# Patient Record
Sex: Female | Born: 1940 | Hispanic: Refuse to answer | Smoking: Former smoker
Health system: Southern US, Community
[De-identification: ages and names within clinical notes are randomized; demographics above are authoritative.]

## PROBLEM LIST (undated history)

## (undated) DIAGNOSIS — F419 Anxiety disorder, unspecified: Secondary | ICD-10-CM

## (undated) DIAGNOSIS — G4730 Sleep apnea, unspecified: Secondary | ICD-10-CM

## (undated) DIAGNOSIS — E139 Other specified diabetes mellitus without complications: Secondary | ICD-10-CM

## (undated) DIAGNOSIS — C801 Malignant (primary) neoplasm, unspecified: Secondary | ICD-10-CM

## (undated) DIAGNOSIS — M810 Age-related osteoporosis without current pathological fracture: Secondary | ICD-10-CM

## (undated) DIAGNOSIS — T7840XA Allergy, unspecified, initial encounter: Secondary | ICD-10-CM

## (undated) DIAGNOSIS — I10 Essential (primary) hypertension: Secondary | ICD-10-CM

## (undated) DIAGNOSIS — H269 Unspecified cataract: Secondary | ICD-10-CM

## (undated) DIAGNOSIS — Z5189 Encounter for other specified aftercare: Secondary | ICD-10-CM

## (undated) DIAGNOSIS — M1990 Unspecified osteoarthritis, unspecified site: Secondary | ICD-10-CM

## (undated) DIAGNOSIS — J449 Chronic obstructive pulmonary disease, unspecified: Secondary | ICD-10-CM

## (undated) DIAGNOSIS — K219 Gastro-esophageal reflux disease without esophagitis: Secondary | ICD-10-CM

## (undated) DIAGNOSIS — F32A Depression, unspecified: Secondary | ICD-10-CM

## (undated) DIAGNOSIS — E785 Hyperlipidemia, unspecified: Secondary | ICD-10-CM

## (undated) HISTORY — DX: Other specified diabetes mellitus without complications: E13.9

## (undated) HISTORY — DX: Unspecified cataract: H26.9

## (undated) HISTORY — DX: Age-related osteoporosis without current pathological fracture: M81.0

## (undated) HISTORY — DX: Unspecified osteoarthritis, unspecified site: M19.90

## (undated) HISTORY — DX: Chronic obstructive pulmonary disease, unspecified: J44.9

## (undated) HISTORY — DX: Depression, unspecified: F32.A

## (undated) HISTORY — DX: Essential (primary) hypertension: I10

## (undated) HISTORY — PX: BREAST EXCISIONAL BIOPSY: SUR124

## (undated) HISTORY — PX: OTHER SURGICAL HISTORY: SHX169

## (undated) HISTORY — DX: Encounter for other specified aftercare: Z51.89

## (undated) HISTORY — DX: Anxiety disorder, unspecified: F41.9

## (undated) HISTORY — DX: Allergy, unspecified, initial encounter: T78.40XA

## (undated) HISTORY — DX: Malignant (primary) neoplasm, unspecified: C80.1

## (undated) HISTORY — DX: Sleep apnea, unspecified: G47.30

## (undated) HISTORY — PX: MELANOMA EXCISION: SHX5266

## (undated) HISTORY — DX: Gastro-esophageal reflux disease without esophagitis: K21.9

## (undated) HISTORY — DX: Hyperlipidemia, unspecified: E78.5

---

## 1978-02-06 HISTORY — PX: CHOLECYSTECTOMY: SHX55

## 1978-02-06 HISTORY — PX: ABDOMINAL HYSTERECTOMY: SHX81

## 1998-02-06 HISTORY — PX: REPLACEMENT TOTAL KNEE: SUR1224

## 2017-11-03 DIAGNOSIS — S42209A Unspecified fracture of upper end of unspecified humerus, initial encounter for closed fracture: Secondary | ICD-10-CM

## 2017-11-03 HISTORY — DX: Unspecified fracture of upper end of unspecified humerus, initial encounter for closed fracture: S42.209A

## 2018-02-11 DIAGNOSIS — F1111 Opioid abuse, in remission: Secondary | ICD-10-CM | POA: Insufficient documentation

## 2018-02-11 DIAGNOSIS — Z780 Asymptomatic menopausal state: Secondary | ICD-10-CM | POA: Insufficient documentation

## 2018-02-11 DIAGNOSIS — D0371 Melanoma in situ of right lower limb, including hip: Secondary | ICD-10-CM | POA: Insufficient documentation

## 2018-02-11 DIAGNOSIS — G5603 Carpal tunnel syndrome, bilateral upper limbs: Secondary | ICD-10-CM | POA: Insufficient documentation

## 2018-02-11 DIAGNOSIS — M069 Rheumatoid arthritis, unspecified: Secondary | ICD-10-CM | POA: Insufficient documentation

## 2018-02-11 HISTORY — DX: Asymptomatic menopausal state: Z78.0

## 2018-02-14 DIAGNOSIS — I340 Nonrheumatic mitral (valve) insufficiency: Secondary | ICD-10-CM | POA: Insufficient documentation

## 2018-02-14 DIAGNOSIS — N393 Stress incontinence (female) (male): Secondary | ICD-10-CM | POA: Insufficient documentation

## 2018-02-14 DIAGNOSIS — K640 First degree hemorrhoids: Secondary | ICD-10-CM | POA: Insufficient documentation

## 2018-02-14 DIAGNOSIS — M19019 Primary osteoarthritis, unspecified shoulder: Secondary | ICD-10-CM | POA: Insufficient documentation

## 2018-02-14 DIAGNOSIS — K5730 Diverticulosis of large intestine without perforation or abscess without bleeding: Secondary | ICD-10-CM | POA: Insufficient documentation

## 2018-02-14 HISTORY — DX: Stress incontinence (female) (male): N39.3

## 2018-02-22 DIAGNOSIS — F4001 Agoraphobia with panic disorder: Secondary | ICD-10-CM | POA: Insufficient documentation

## 2018-02-27 DIAGNOSIS — I6523 Occlusion and stenosis of bilateral carotid arteries: Secondary | ICD-10-CM | POA: Insufficient documentation

## 2018-02-27 HISTORY — DX: Occlusion and stenosis of bilateral carotid arteries: I65.23

## 2019-05-19 DIAGNOSIS — G459 Transient cerebral ischemic attack, unspecified: Secondary | ICD-10-CM | POA: Insufficient documentation

## 2019-05-19 DIAGNOSIS — K635 Polyp of colon: Secondary | ICD-10-CM | POA: Insufficient documentation

## 2019-05-19 DIAGNOSIS — N3946 Mixed incontinence: Secondary | ICD-10-CM | POA: Insufficient documentation

## 2019-05-19 DIAGNOSIS — F17201 Nicotine dependence, unspecified, in remission: Secondary | ICD-10-CM | POA: Insufficient documentation

## 2019-05-19 DIAGNOSIS — K219 Gastro-esophageal reflux disease without esophagitis: Secondary | ICD-10-CM | POA: Insufficient documentation

## 2019-05-19 DIAGNOSIS — R739 Hyperglycemia, unspecified: Secondary | ICD-10-CM

## 2019-05-19 DIAGNOSIS — F1111 Opioid abuse, in remission: Secondary | ICD-10-CM

## 2019-05-19 DIAGNOSIS — J449 Chronic obstructive pulmonary disease, unspecified: Secondary | ICD-10-CM | POA: Diagnosis present

## 2019-05-19 DIAGNOSIS — M4802 Spinal stenosis, cervical region: Secondary | ICD-10-CM | POA: Insufficient documentation

## 2019-05-19 DIAGNOSIS — I10 Essential (primary) hypertension: Secondary | ICD-10-CM

## 2019-05-19 DIAGNOSIS — F411 Generalized anxiety disorder: Secondary | ICD-10-CM | POA: Insufficient documentation

## 2019-05-19 DIAGNOSIS — G894 Chronic pain syndrome: Secondary | ICD-10-CM | POA: Insufficient documentation

## 2019-05-19 DIAGNOSIS — N819 Female genital prolapse, unspecified: Secondary | ICD-10-CM | POA: Insufficient documentation

## 2019-05-19 DIAGNOSIS — D649 Anemia, unspecified: Secondary | ICD-10-CM | POA: Insufficient documentation

## 2019-05-19 DIAGNOSIS — F319 Bipolar disorder, unspecified: Secondary | ICD-10-CM | POA: Insufficient documentation

## 2019-05-19 DIAGNOSIS — I6523 Occlusion and stenosis of bilateral carotid arteries: Secondary | ICD-10-CM | POA: Insufficient documentation

## 2019-05-19 DIAGNOSIS — F4323 Adjustment disorder with mixed anxiety and depressed mood: Secondary | ICD-10-CM | POA: Insufficient documentation

## 2019-05-19 DIAGNOSIS — Z8673 Personal history of transient ischemic attack (TIA), and cerebral infarction without residual deficits: Secondary | ICD-10-CM | POA: Insufficient documentation

## 2019-05-19 DIAGNOSIS — F330 Major depressive disorder, recurrent, mild: Secondary | ICD-10-CM | POA: Insufficient documentation

## 2019-05-19 DIAGNOSIS — K5750 Diverticulosis of both small and large intestine without perforation or abscess without bleeding: Secondary | ICD-10-CM | POA: Insufficient documentation

## 2019-05-19 DIAGNOSIS — G4733 Obstructive sleep apnea (adult) (pediatric): Secondary | ICD-10-CM | POA: Insufficient documentation

## 2019-05-19 DIAGNOSIS — I119 Hypertensive heart disease without heart failure: Secondary | ICD-10-CM | POA: Insufficient documentation

## 2019-05-19 DIAGNOSIS — F321 Major depressive disorder, single episode, moderate: Secondary | ICD-10-CM | POA: Insufficient documentation

## 2019-05-19 DIAGNOSIS — I369 Nonrheumatic tricuspid valve disorder, unspecified: Secondary | ICD-10-CM | POA: Insufficient documentation

## 2019-05-19 HISTORY — DX: Opioid abuse, in remission: F11.11

## 2019-05-19 HISTORY — DX: Essential (primary) hypertension: I10

## 2019-05-19 HISTORY — DX: Nicotine dependence, unspecified, in remission: F17.201

## 2019-05-19 HISTORY — DX: Hyperglycemia, unspecified: R73.9

## 2019-05-22 DIAGNOSIS — R7303 Prediabetes: Secondary | ICD-10-CM | POA: Insufficient documentation

## 2019-05-22 DIAGNOSIS — E1169 Type 2 diabetes mellitus with other specified complication: Secondary | ICD-10-CM | POA: Insufficient documentation

## 2019-05-22 DIAGNOSIS — E782 Mixed hyperlipidemia: Secondary | ICD-10-CM | POA: Insufficient documentation

## 2019-05-22 HISTORY — DX: Prediabetes: R73.03

## 2019-12-17 ENCOUNTER — Other Ambulatory Visit: Payer: Self-pay

## 2019-12-17 ENCOUNTER — Encounter: Payer: Self-pay | Admitting: Physician Assistant

## 2019-12-17 ENCOUNTER — Telehealth: Payer: Self-pay | Admitting: Physician Assistant

## 2019-12-17 ENCOUNTER — Ambulatory Visit (INDEPENDENT_AMBULATORY_CARE_PROVIDER_SITE_OTHER): Payer: Medicare HMO | Admitting: Physician Assistant

## 2019-12-17 VITALS — BP 110/70 | HR 90 | Temp 98.3°F | Resp 16 | Ht 64.0 in | Wt 252.0 lb

## 2019-12-17 DIAGNOSIS — I2510 Atherosclerotic heart disease of native coronary artery without angina pectoris: Secondary | ICD-10-CM

## 2019-12-17 DIAGNOSIS — Z85820 Personal history of malignant melanoma of skin: Secondary | ICD-10-CM

## 2019-12-17 DIAGNOSIS — F411 Generalized anxiety disorder: Secondary | ICD-10-CM

## 2019-12-17 DIAGNOSIS — I10 Essential (primary) hypertension: Secondary | ICD-10-CM

## 2019-12-17 NOTE — Telephone Encounter (Signed)
Needs clarification on a medication that we sent in

## 2019-12-17 NOTE — Patient Instructions (Signed)
You will be contacted by Oncology and Cardiology.  I will make sure you have medication refills at your pharmacy.  I will call you once I get records and have reviewed them.  Schedule your Physical at your earliest convenience.   It was very nice meeting you today. Welcome to [facility]!

## 2019-12-17 NOTE — Progress Notes (Signed)
Patient presents to clinic today to establish care.  Patient recently moved here from [place] in September to be closer to her sons and grandchildren.  She currently resides in [facility].  Notes enjoying her neighbors and has made many new friends there.  Acute Concerns: Denies acute concerns at today's visit.   Chronic Issues: Hypertension/Hyperlipidemia --patient currently on a regimen of amlodipine 5 mg daily, hydrochlorothiazide 25 mg daily, losartan 100 mg daily and hydralazine 25 mg twice daily.  Also takes atorvastatin 10 mg once daily along with a baby aspirin.  Denies personal history of stroke or heart attack.  Notes prior stress testing and being told this was normal.  Has history of sleep apnea with nighttime CPAP use.  Patient was followed by cardiology in [place] and would like a specialist here in the area.  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  Has been followed by both dermatology and oncology.  Has dermatologist in the area but has not been able to establish with a new oncologist since moving from [place].  Would like referral.  Health Maintenance: Immunizations --patient unsure.  Records requested. Bone Density --patient endorses bone density testing up-to-date.  Records requested.  Past Medical History:  Diagnosis Date  . Allergy   . Anxiety   . Arthritis   . Blood transfusion without reported diagnosis   . Cancer ([place])    Melanoma on Great toe  . Cataract   . COPD (chronic obstructive pulmonary disease) ([place])   . Depression   . GERD (gastroesophageal reflux disease)   . Hyperlipidemia   . Hypertension   . Osteoporosis   . Sleep apnea     Past Surgical History:  Procedure Laterality Date  . ABDOMINAL HYSTERECTOMY  1980  . CHOLECYSTECTOMY  1980  . MELANOMA EXCISION Right    Great Toe  . REPLACEMENT TOTAL KNEE Bilateral 2000    Current Outpatient Medications on File Prior to Visit  Medication Sig Dispense  Refill  . amLODipine (NORVASC) 5 MG tablet Take 5 mg by mouth daily.    Marland Kitchen aspirin EC 81 MG tablet Take 81 mg by mouth daily. Swallow whole.    Marland Kitchen atorvastatin (LIPITOR) 10 MG tablet Take 10 mg by mouth daily.    . cetirizine (ZYRTEC) 10 MG tablet Take 10 mg by mouth daily.    . clonazePAM (KLONOPIN) 0.5 MG tablet Take 0.5 mg by mouth every 12 (twelve) hours as needed.    Marland Kitchen EPINEPHrine 0.3 mg/0.3 mL IJ SOAJ injection SMARTSIG:0.3 Milligram(s) IM Once PRN    . famotidine (PEPCID) 40 MG tablet Take 40 mg by mouth daily.    . hydrALAZINE (APRESOLINE) 25 MG tablet Take 25 mg by mouth 2 (two) times daily.    . hydrochlorothiazide (HYDRODIURIL) 25 MG tablet Take 25 mg by mouth daily.    Marland Kitchen losartan (COZAAR) 100 MG tablet Take 100 mg by mouth daily.    Marland Kitchen PARoxetine (PAXIL) 40 MG tablet Take 40 mg by mouth daily.     No current facility-administered medications on file prior to visit.    Allergies  Allergen Reactions  . Penicillins     childhood  . Sulfa Antibiotics     Childhood  . Codeine Rash    Family History  Problem Relation Age of Onset  . Cancer Mother   . Intellectual disability Mother   . Cancer Father   . Early death Father   . Hypertension Father   . Cancer Sister   .  Arthritis Maternal Grandmother   . Asthma Paternal Grandmother   . Asthma Paternal Grandfather     Social History   Socioeconomic History  . Marital status: Widowed    Spouse name: Not on file  . Number of children: Not on file  . Years of education: Not on file  . Highest education level: Not on file  Occupational History  . Not on file  Tobacco Use  . Smoking status: Former Smoker    Types: Cigarettes  . Smokeless tobacco: Never Used  Vaping Use  . Vaping Use: Never used  Substance and Sexual Activity  . Alcohol use: Not Currently  . Drug use: Not Currently  . Sexual activity: Not Currently  Other Topics Concern  . Not on file  Social History Narrative  . Not on file   Social  Determinants of Health   Financial Resource Strain:   . Difficulty of Paying Living Expenses: Not on file  Food Insecurity:   . Worried About Charity fundraiser in the Last Year: Not on file  . Ran Out of Food in the Last Year: Not on file  Transportation Needs:   . Lack of Transportation (Medical): Not on file  . Lack of Transportation (Non-Medical): Not on file  Physical Activity:   . Days of Exercise per Week: Not on file  . Minutes of Exercise per Session: Not on file  Stress:   . Feeling of Stress : Not on file  Social Connections:   . Frequency of Communication with Friends and Family: Not on file  . Frequency of Social Gatherings with Friends and Family: Not on file  . Attends Religious Services: Not on file  . Active Member of Clubs or Organizations: Not on file  . Attends Archivist Meetings: Not on file  . Marital Status: Not on file  Intimate Partner Violence:   . Fear of Current or Ex-Partner: Not on file  . Emotionally Abused: Not on file  . Physically Abused: Not on file  . Sexually Abused: Not on file   ROS Pertinent ROS are listed in the HPI.   Resp 16   Ht 5\' 4"  (1.626 m)   Wt 252 lb (114.3 kg)   BMI 43.26 kg/m   Physical Exam Vitals reviewed.  Constitutional:      Appearance: Normal appearance. She is obese.  HENT:     Head: Normocephalic and atraumatic.  Cardiovascular:     Rate and Rhythm: Normal rate and regular rhythm.     Pulses: Normal pulses.     Heart sounds: Normal heart sounds.  Pulmonary:     Effort: Pulmonary effort is normal.     Breath sounds: Normal breath sounds.  Musculoskeletal:     Cervical back: Neck supple.  Neurological:     General: No focal deficit present.     Mental Status: She is alert and oriented to person, place, and time.    Assessment/Plan: 1. History of malignant melanoma Referral to oncology placed. - Ambulatory referral to Oncology  2. Hypertension, unspecified type 3. Coronary artery  disease involving native heart without angina pectoris, unspecified vessel or lesion type BP stable.  Asymptomatic.  Per assisted living facility reports she is being given her medications as directed.  Continue current regimen.  Will refer to cardiology in the area per patient request.  Will obtain records to review.  Will update labs when due. - Ambulatory referral to Cardiology  This visit occurred during the SARS-CoV-2 public  health emergency.  Safety protocols were in place, including screening questions prior to the visit, additional usage of staff PPE, and extensive cleaning of exam room while observing appropriate contact time as indicated for disinfecting solutions.     Leeanne Rio, PA-C

## 2019-12-18 DIAGNOSIS — C4491 Basal cell carcinoma of skin, unspecified: Secondary | ICD-10-CM | POA: Insufficient documentation

## 2019-12-18 DIAGNOSIS — I10 Essential (primary) hypertension: Secondary | ICD-10-CM | POA: Insufficient documentation

## 2019-12-18 DIAGNOSIS — Z9889 Other specified postprocedural states: Secondary | ICD-10-CM | POA: Insufficient documentation

## 2019-12-18 DIAGNOSIS — Z85820 Personal history of malignant melanoma of skin: Secondary | ICD-10-CM | POA: Insufficient documentation

## 2019-12-18 DIAGNOSIS — I2510 Atherosclerotic heart disease of native coronary artery without angina pectoris: Secondary | ICD-10-CM | POA: Insufficient documentation

## 2019-12-18 NOTE — Telephone Encounter (Signed)
Spoke with [name] [name] at [facility] living about clarification of medication For the Clonazepam needing mg, hours in between dosage. They do not accept the( bid ) Clonazepam 0.5 mg take 0.5 tablet by mouth every 12 hours as needed. Rx has been updated and will fax to [facility] at [phone] to send to pharmacy.

## 2019-12-19 ENCOUNTER — Other Ambulatory Visit: Payer: Self-pay

## 2019-12-19 ENCOUNTER — Ambulatory Visit (INDEPENDENT_AMBULATORY_CARE_PROVIDER_SITE_OTHER): Payer: Medicare HMO | Admitting: Physician Assistant

## 2019-12-19 ENCOUNTER — Other Ambulatory Visit: Payer: Self-pay | Admitting: Physician Assistant

## 2019-12-19 ENCOUNTER — Telehealth: Payer: Self-pay | Admitting: Physician Assistant

## 2019-12-19 ENCOUNTER — Encounter: Payer: Self-pay | Admitting: Physician Assistant

## 2019-12-19 VITALS — BP 120/70 | HR 92 | Temp 98.0°F | Resp 16 | Ht 64.0 in | Wt 252.0 lb

## 2019-12-19 DIAGNOSIS — C44729 Squamous cell carcinoma of skin of left lower limb, including hip: Secondary | ICD-10-CM

## 2019-12-19 MED ORDER — SARNA 0.5-0.5 % EX LOTN: 1.0000 "application " | TOPICAL_LOTION | CUTANEOUS | 0 refills | Status: AC | PRN

## 2019-12-19 MED ORDER — CLONAZEPAM 0.5 MG PO TABS: 0.2500 mg | ORAL_TABLET | Freq: Two times a day (BID) | ORAL | 1 refills | Status: DC | PRN

## 2019-12-19 MED ORDER — ALBUTEROL SULFATE HFA 108 (90 BASE) MCG/ACT IN AERS
1.0000 | INHALATION_SPRAY | Freq: Four times a day (QID) | RESPIRATORY_TRACT | 3 refills | Status: DC | PRN
Start: 2019-12-19 — End: 2020-06-03

## 2019-12-19 NOTE — Telephone Encounter (Signed)
ED from [facility] called in stating that they haven't received a script for the Clonazepam.  Please advise  Pharmacy # is [phone]

## 2019-12-19 NOTE — Telephone Encounter (Signed)
Rx resent.

## 2019-12-19 NOTE — Progress Notes (Signed)
Patient presents to clinic today c/o itchy skin lesion of posterior L calf, first noticed a few days ago. Notes the area is scaly/scabby. Area is at the site of previous SCC removal 2 years ago per patient. Patient also with a history of malignant melanoma. Referral was placed at last visit to Oncology with pending appt (awaiting records).   Past Medical History:  Diagnosis Date  . Allergy   . Anxiety   . Arthritis   . Blood transfusion without reported diagnosis   . Cancer ([place])    Melanoma on Great toe  . Cataract   . COPD (chronic obstructive pulmonary disease) ([place])   . Depression   . GERD (gastroesophageal reflux disease)   . Hyperlipidemia   . Hypertension   . Osteoporosis   . Sleep apnea     Current Outpatient Medications on File Prior to Visit  Medication Sig Dispense Refill  . albuterol (VENTOLIN HFA) 108 (90 Base) MCG/ACT inhaler Inhale 1-2 puffs into the lungs every 6 (six) hours as needed for wheezing or shortness of breath.    Marland Kitchen amLODipine (NORVASC) 5 MG tablet Take 5 mg by mouth daily.    Marland Kitchen aspirin EC 81 MG tablet Take 81 mg by mouth daily. Swallow whole.    Marland Kitchen atorvastatin (LIPITOR) 10 MG tablet Take 10 mg by mouth daily.    . cetirizine (ZYRTEC) 10 MG tablet Take 10 mg by mouth daily.    Marland Kitchen EPINEPHrine 0.3 mg/0.3 mL IJ SOAJ injection SMARTSIG:0.3 Milligram(s) IM Once PRN    . famotidine (PEPCID) 40 MG tablet Take 40 mg by mouth daily.    . hydrALAZINE (APRESOLINE) 25 MG tablet Take 25 mg by mouth 2 (two) times daily.    . hydrochlorothiazide (HYDRODIURIL) 25 MG tablet Take 25 mg by mouth daily.    Marland Kitchen losartan (COZAAR) 100 MG tablet Take 100 mg by mouth daily.    Marland Kitchen PARoxetine (PAXIL) 40 MG tablet Take 40 mg by mouth daily.     No current facility-administered medications on file prior to visit.    Allergies  Allergen Reactions  . Penicillins     childhood  . Sulfa Antibiotics     Childhood  . Cymbalta [Duloxetine Hcl]   . Ivp Dye [Iodinated [employer]  [employer]]   . Codeine Rash  . Erythromycin Rash  . Levofloxacin Rash  . Percocet [Oxycodone-Acetaminophen] Rash    Family History  Problem Relation Age of Onset  . Cancer Mother   . Intellectual disability Mother   . Cancer Father   . Early death Father   . Hypertension Father   . Cancer Sister   . Arthritis Maternal Grandmother   . Asthma Paternal Grandmother   . Asthma Paternal Grandfather     Social History   Socioeconomic History  . Marital status: Widowed    Spouse name: Not on file  . Number of children: Not on file  . Years of education: Not on file  . Highest education level: Not on file  Occupational History  . Not on file  Tobacco Use  . Smoking status: Former Smoker    Types: Cigarettes  . Smokeless tobacco: Never Used  Vaping Use  . Vaping Use: Never used  Substance and Sexual Activity  . Alcohol use: Not Currently  . Drug use: Not Currently  . Sexual activity: Not Currently  Other Topics Concern  . Not on file  Social History Narrative  . Not on file   Social Determinants of Health  Financial Resource Strain:   . Difficulty of Paying Living Expenses: Not on file  Food Insecurity:   . Worried About Charity fundraiser in the Last Year: Not on file  . Ran Out of Food in the Last Year: Not on file  Transportation Needs:   . Lack of Transportation (Medical): Not on file  . Lack of Transportation (Non-Medical): Not on file  Physical Activity:   . Days of Exercise per Week: Not on file  . Minutes of Exercise per Session: Not on file  Stress:   . Feeling of Stress : Not on file  Social Connections:   . Frequency of Communication with Friends and Family: Not on file  . Frequency of Social Gatherings with Friends and Family: Not on file  . Attends Religious Services: Not on file  . Active Member of Clubs or Organizations: Not on file  . Attends Archivist Meetings: Not on file  . Marital Status: Not on file   Review of Systems - See  HPI.  All other ROS are negative.  BP 120/70   Pulse 92   Temp 98 F (36.7 C) (Temporal)   Resp 16   Ht 5\' 4"  (1.626 m)   Wt 252 lb (114.3 kg)   SpO2 97%   BMI 43.26 kg/m   Physical Exam Vitals reviewed.  Constitutional:      Appearance: Normal appearance.  HENT:     Head: Normocephalic and atraumatic.  Cardiovascular:     Rate and Rhythm: Normal rate and regular rhythm.     Pulses: Normal pulses.     Heart sounds: Normal heart sounds.  Musculoskeletal:     Cervical back: Neck supple.  Skin:      Neurological:     General: No focal deficit present.     Mental Status: She is alert and oriented to person, place, and time.  Psychiatric:        Mood and Affect: Mood normal.     Assessment/Plan: .1. 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  Referral back to dermatology placed for evaluation and proper management.  Skin care discussed.  OTC anti-itch medications reviewed with patient. - Ambulatory referral to Dermatology   This visit occurred during the SARS-CoV-2 public health emergency.  Safety protocols were in place, including screening questions prior to the visit, additional usage of staff PPE, and extensive cleaning of exam room while observing appropriate contact time as indicated for disinfecting solutions.     Leeanne Rio, PA-C

## 2019-12-19 NOTE — Patient Instructions (Signed)
Please keep skin clean and dry. Cold compresses will help with itching.  You can apply the Sarna lotion as needed -- This is an OTC medication but I sent as a script so [facility] can get for you.   I am working on getting you in with Dermatology ASAP. I will check on status of appointment for you again on Monday. Hopefully we can get you seen in the next week.

## 2019-12-23 ENCOUNTER — Telehealth: Payer: Self-pay | Admitting: Physician Assistant

## 2019-12-23 NOTE — Telephone Encounter (Signed)
Patient called and states that you changed her Klonopin 0.5 mg to 1/2 tablet every 12 hours.  Patient feels like she should be on 1/2 tablet 3 times a day.  Please advise.

## 2019-12-23 NOTE — Telephone Encounter (Signed)
Please advise 

## 2019-12-25 NOTE — Telephone Encounter (Signed)
Will leave at current dose at present as we just adjusted from once daily dosing.

## 2019-12-28 NOTE — Progress Notes (Signed)
Cardiology Office Note:    Date:  12/29/2019   ID:  [name] [name], DOB 23-Jan-1941, MRN [id number]  PCP:  Brunetta Jeans, PA-C  Cardiologist:  No primary care provider on file.  Electrophysiologist:  None   Referring MD: Brunetta Jeans, PA-C   Chief Complaint  Patient presents with  . Hypertension    History of Present Illness:    [name] [name] is a 79 y.o. female with a hx of melanoma, COPD, hypertension, hyperlipidemia, OSA who is referred by Raiford Noble, PA for evaluation of hypertension.  She recently moved to [place] from [place].  Previously followed with Dr. Nolene Bernheim in [place], [place].  Reports that had episode of syncope 2 years ago and had loop recorder inserted.  Reports has not been interrogated in a while.  She denies any chest pain but does state that she gets short of breath with exertion.  She has been doing physical therapy.  She denies any lightheadedness.  Does report she has intermittent lower extremity edema.  States that she has been having pain in her legs.  Reports compliance with her CPAP.  Former smoker, quit in 1988.  No history of heart disease in her immediate family.   Past Medical History:  Diagnosis Date  . Allergy   . Anxiety   . Arthritis   . Blood transfusion without reported diagnosis   . Cancer ([place])    Melanoma on Great toe  . Cataract   . COPD (chronic obstructive pulmonary disease) ([place])   . Depression   . GERD (gastroesophageal reflux disease)   . Hyperlipidemia   . Hypertension   . Osteoporosis   . Sleep apnea     Past Surgical History:  Procedure Laterality Date  . ABDOMINAL HYSTERECTOMY  1980  . CHOLECYSTECTOMY  1980  . MELANOMA EXCISION Right    Great Toe  . REPLACEMENT TOTAL KNEE Bilateral 2000    Current Medications: Current Meds  Medication Sig  . albuterol (VENTOLIN HFA) 108 (90 Base) MCG/ACT inhaler Inhale 1-2 puffs into the lungs every 6 (six) hours as needed for wheezing or shortness of  breath.  Marland Kitchen amLODipine (NORVASC) 5 MG tablet Take 5 mg by mouth daily.  Marland Kitchen aspirin EC 81 MG tablet Take 81 mg by mouth daily. Swallow whole.  Marland Kitchen atorvastatin (LIPITOR) 10 MG tablet Take 10 mg by mouth daily.  . calcium-vitamin D (OSCAL WITH D) 500-200 MG-UNIT tablet Take 1 tablet by mouth.  . camphor-menthol (SARNA) lotion Apply 1 application topically as needed for itching.  . cetirizine (ZYRTEC) 10 MG tablet Take 10 mg by mouth daily.  . clonazePAM (KLONOPIN) 0.5 MG tablet Take 0.5 tablets (0.25 mg total) by mouth every 12 (twelve) hours as needed.  Marland Kitchen EPINEPHrine 0.3 mg/0.3 mL IJ SOAJ injection SMARTSIG:0.3 Milligram(s) IM Once PRN  . ergocalciferol (VITAMIN D2) 1.25 MG (50000 UT) capsule Take 50,000 Units by mouth once a week.  . famotidine (PEPCID) 40 MG tablet Take 40 mg by mouth daily.  . hydrALAZINE (APRESOLINE) 25 MG tablet Take 25 mg by mouth 2 (two) times daily.  . hydrochlorothiazide (HYDRODIURIL) 25 MG tablet Take 25 mg by mouth daily.  Marland Kitchen losartan (COZAAR) 100 MG tablet Take 100 mg by mouth daily.  Marland Kitchen PARoxetine (PAXIL) 40 MG tablet Take 40 mg by mouth daily.     Allergies:   Penicillins, Sulfa antibiotics, Cymbalta [duloxetine hcl], Ivp dye [iodinated diagnostic agents], Codeine, Erythromycin, Levofloxacin, and Percocet [oxycodone-acetaminophen]   Social History   Socioeconomic History  .  Marital status: Widowed    Spouse name: Not on file  . Number of children: Not on file  . Years of education: Not on file  . Highest education level: Not on file  Occupational History  . Not on file  Tobacco Use  . Smoking status: Former Smoker    Types: Cigarettes  . Smokeless tobacco: Never Used  Vaping Use  . Vaping Use: Never used  Substance and Sexual Activity  . Alcohol use: Not Currently  . Drug use: Not Currently  . Sexual activity: Not Currently  Other Topics Concern  . Not on file  Social History Narrative  . Not on file   Social Determinants of Health   Financial  Resource Strain:   . Difficulty of Paying Living Expenses: Not on file  Food Insecurity:   . Worried About Charity fundraiser in the Last Year: Not on file  . Ran Out of Food in the Last Year: Not on file  Transportation Needs:   . Lack of Transportation (Medical): Not on file  . Lack of Transportation (Non-Medical): Not on file  Physical Activity:   . Days of Exercise per Week: Not on file  . Minutes of Exercise per Session: Not on file  Stress:   . Feeling of Stress : Not on file  Social Connections:   . Frequency of Communication with Friends and Family: Not on file  . Frequency of Social Gatherings with Friends and Family: Not on file  . Attends Religious Services: Not on file  . Active Member of Clubs or Organizations: Not on file  . Attends Archivist Meetings: Not on file  . Marital Status: Not on file     Family History: The patient's family history includes Arthritis in her maternal grandmother; Asthma in her paternal grandfather and paternal grandmother; Cancer in her father, mother, and sister; Early death in her father; Hypertension in her father; Intellectual disability in her mother.  ROS:   Please see the history of present illness.     All other systems reviewed and are negative.  EKGs/Labs/Other Studies Reviewed:    The following studies were reviewed today:   EKG:  EKG is ordered today.  The ekg ordered today demonstrates normal sinus rhythm, rate 98, poor R wave progression, left axis deviation, T wave inversions in leads I/aVL  Recent Labs: 12/29/2019: ALT WILL FOLLOW; BUN WILL FOLLOW; Creatinine, Ser WILL FOLLOW; Hemoglobin 11.9; Platelets 312; Potassium WILL FOLLOW; Sodium WILL FOLLOW; TSH WILL FOLLOW  Recent Lipid Panel    Component Value Date/Time   CHOL WILL FOLLOW 12/29/2019 1434   TRIG WILL FOLLOW 12/29/2019 1434   HDL WILL FOLLOW 12/29/2019 1434   CHOLHDL WILL FOLLOW 12/29/2019 1434   [place] 12/29/2019 1434     Physical Exam:    VS:  BP (!) 113/58   Pulse 98   Ht 5\' 4"  (1.626 m)   Wt 254 lb 9.6 oz (115.5 kg)   SpO2 96%   BMI 43.70 kg/m     Wt Readings from Last 3 Encounters:  12/29/19 254 lb 9.6 oz (115.5 kg)  12/19/19 252 lb (114.3 kg)  12/17/19 252 lb (114.3 kg)     GEN:  Well nourished, well developed in no acute distress HEENT: Normal NECK: No JVD; No carotid bruits CARDIAC:RRR, 2 out of 6 systolic murmur RESPIRATORY:  Clear to auscultation without rales, wheezing or rhonchi  ABDOMEN: Soft, non-tender, non-distended MUSCULOSKELETAL:  No edema; No deformity  SKIN:  Warm and dry NEUROLOGIC:  Alert and oriented x 3 PSYCHIATRIC:  Normal affect   ASSESSMENT:    1. Shortness of breath   2. Bilateral leg pain   3. Hypertension, unspecified type   4. Hyperlipidemia, unspecified hyperlipidemia type   5. Syncope and collapse    PLAN:    Dyspnea: Will check echocardiogram to evaluate for structural heart disease.  Will check CMP, CBC, TSH  Syncope: Reports syncopal episode 2 years ago, had loop recorder placed.  Will establish with device clinic for loop interrogations.  Hypertension: On hydralazine 25 mg twice daily, hydrochlorothiazide 12 5 mg daily, losartan 100 mg daily, amlodipine 5 mg daily.  Appears controlled  Hyperlipidemia: On atorvastatin 10 mg daily.  Will check lipid panel  Leg pain: Will check ABIs  RTC in 3 months   Medication Adjustments/Labs and Tests Ordered: Current medicines are reviewed at length with the patient today.  Concerns regarding medicines are outlined above.  Orders Placed This Encounter  Procedures  . Lipid panel  . Comprehensive metabolic panel  . CBC  . TSH  . Hemoglobin A1c  . EKG 12-Lead  . ECHOCARDIOGRAM COMPLETE  . VAS [place] ABI WITH/WO TBI  . VAS [place] LOWER EXTREMITY ARTERIAL DUPLEX   No orders of the defined types were placed in this encounter.   Patient Instructions  Medication Instructions:  Your physician recommends  that you continue on your current medications as directed. Please refer to the Current Medication list given to you today.  *If you need a refill on your cardiac medications before your next appointment, please call your pharmacy*   Lab Work: CMET, CBC, Lipid, TSH, HmgA1C  If you have labs (blood work) drawn today and your tests are completely normal, you will receive your results only by: Marland Kitchen MyChart Message (if you have MyChart) OR . A paper copy in the mail If you have any lab test that is abnormal or we need to change your treatment, we will call you to review the results.   Testing/Procedures: Your physician has requested that you have an echocardiogram. Echocardiography is a painless test that uses sound waves to create images of your heart. It provides your doctor with information about the size and shape of your heart and how well your heart's chambers and valves are working. This procedure takes approximately one hour. There are no restrictions for this procedure. This will be done at our [facility] location:  [place] has requested that you have an ankle brachial index (ABI). During this test an ultrasound and blood pressure cuff are used to evaluate the arteries that supply the arms and legs with blood. Allow thirty minutes for this exam. There are no restrictions or special instructions.  Follow-Up: At [facility], you and your health needs are our priority.  As part of our continuing mission to provide you with exceptional heart care, we have created designated Provider Care Teams.  These Care Teams include your primary Cardiologist (physician) and Advanced Practice Providers (APPs -  Physician Assistants and Nurse Practitioners) who all work together to provide you with the care you need, when you need it.  We recommend signing up for the patient portal called "MyChart".  Sign up information is provided on this After Visit Summary.   MyChart is used to connect with patients for Virtual Visits (Telemedicine).  Patients are able to view lab/test results, encounter notes, upcoming appointments, etc.  Non-urgent messages can be sent  to your provider as well.   To learn more about what you can do with MyChart, go to [email or web address].    Your next appointment:   3 month(s)  The format for your next appointment:   In Person  Provider:   Oswaldo Milian, MD        Signed, Donato Heinz, MD  12/29/2019 11:13 PM    [place]

## 2019-12-29 ENCOUNTER — Ambulatory Visit (INDEPENDENT_AMBULATORY_CARE_PROVIDER_SITE_OTHER): Payer: Medicare HMO | Admitting: Cardiology

## 2019-12-29 ENCOUNTER — Encounter: Payer: Self-pay | Admitting: Cardiology

## 2019-12-29 ENCOUNTER — Other Ambulatory Visit: Payer: Self-pay

## 2019-12-29 VITALS — BP 113/58 | HR 98 | Ht 64.0 in | Wt 254.6 lb

## 2019-12-29 DIAGNOSIS — M79604 Pain in right leg: Secondary | ICD-10-CM

## 2019-12-29 DIAGNOSIS — I10 Essential (primary) hypertension: Secondary | ICD-10-CM

## 2019-12-29 DIAGNOSIS — M79605 Pain in left leg: Secondary | ICD-10-CM

## 2019-12-29 DIAGNOSIS — R0602 Shortness of breath: Secondary | ICD-10-CM | POA: Diagnosis not present

## 2019-12-29 DIAGNOSIS — E785 Hyperlipidemia, unspecified: Secondary | ICD-10-CM | POA: Diagnosis not present

## 2019-12-29 DIAGNOSIS — R55 Syncope and collapse: Secondary | ICD-10-CM

## 2019-12-29 NOTE — Patient Instructions (Signed)
Medication Instructions:  Your physician recommends that you continue on your current medications as directed. Please refer to the Current Medication list given to you today.  *If you need a refill on your cardiac medications before your next appointment, please call your pharmacy*   Lab Work: CMET, CBC, Lipid, TSH, HmgA1C  If you have labs (blood work) drawn today and your tests are completely normal, you will receive your results only by: Marland Kitchen MyChart Message (if you have MyChart) OR . A paper copy in the mail If you have any lab test that is abnormal or we need to change your treatment, we will call you to review the results.   Testing/Procedures: Your physician has requested that you have an echocardiogram. Echocardiography is a painless test that uses sound waves to create images of your heart. It provides your doctor with information about the size and shape of your heart and how well your heart's chambers and valves are working. This procedure takes approximately one hour. There are no restrictions for this procedure. This will be done at our [facility] location:  [facility] has requested that you have an ankle brachial index (ABI). During this test an ultrasound and blood pressure cuff are used to evaluate the arteries that supply the arms and legs with blood. Allow thirty minutes for this exam. There are no restrictions or special instructions.  Follow-Up: At [facility], you and your health needs are our priority.  As part of our continuing mission to provide you with exceptional heart care, we have created designated Provider Care Teams.  These Care Teams include your primary Cardiologist (physician) and Advanced Practice Providers (APPs -  Physician Assistants and Nurse Practitioners) who all work together to provide you with the care you need, when you need it.  We recommend signing up for the patient portal called "MyChart".  Sign up  information is provided on this After Visit Summary.  MyChart is used to connect with patients for Virtual Visits (Telemedicine).  Patients are able to view lab/test results, encounter notes, upcoming appointments, etc.  Non-urgent messages can be sent to your provider as well.   To learn more about what you can do with MyChart, go to [email or web address].    Your next appointment:   3 month(s)  The format for your next appointment:   In Person  Provider:   Oswaldo Milian, MD

## 2019-12-30 ENCOUNTER — Telehealth: Payer: Self-pay | Admitting: Cardiology

## 2019-12-30 ENCOUNTER — Other Ambulatory Visit: Payer: Self-pay | Admitting: Physician Assistant

## 2019-12-30 DIAGNOSIS — N393 Stress incontinence (female) (male): Secondary | ICD-10-CM

## 2019-12-30 DIAGNOSIS — K5750 Diverticulosis of both small and large intestine without perforation or abscess without bleeding: Secondary | ICD-10-CM

## 2019-12-30 DIAGNOSIS — F4323 Adjustment disorder with mixed anxiety and depressed mood: Secondary | ICD-10-CM | POA: Insufficient documentation

## 2019-12-30 LAB — CBC
Hematocrit: 36.4 % (ref 34.0–46.6)
Hemoglobin: 11.9 g/dL (ref 11.1–15.9)
MCH: 28 pg (ref 26.6–33.0)
MCHC: 32.7 g/dL (ref 31.5–35.7)
MCV: 86 fL (ref 79–97)
Platelets: 312 10*3/uL (ref 150–450)
RBC: 4.25 x10E6/uL (ref 3.77–5.28)
RDW: 14.5 % (ref 11.7–15.4)
WBC: 9.3 10*3/uL (ref 3.4–10.8)

## 2019-12-30 LAB — COMPREHENSIVE METABOLIC PANEL
ALT: 19 IU/L (ref 0–32)
AST: 29 IU/L (ref 0–40)
Albumin/Globulin Ratio: 1.4 (ref 1.2–2.2)
Albumin: 4.1 g/dL (ref 3.7–4.7)
Alkaline Phosphatase: 112 IU/L (ref 44–121)
BUN/Creatinine Ratio: 16 (ref 12–28)
BUN: 15 mg/dL (ref 8–27)
Bilirubin Total: 0.3 mg/dL (ref 0.0–1.2)
CO2: 21 mmol/L (ref 20–29)
Calcium: 9.6 mg/dL (ref 8.7–10.3)
Chloride: 101 mmol/L (ref 96–106)
Creatinine, Ser: 0.91 mg/dL (ref 0.57–1.00)
GFR calc Af Amer: 69 mL/min/{1.73_m2} (ref >59)
GFR calc non Af Amer: 60 mL/min/{1.73_m2} (ref >59)
Globulin, Total: 3 g/dL (ref 1.5–4.5)
Glucose: 138 mg/dL — ABNORMAL HIGH (ref 65–99)
Potassium: 3.8 mmol/L (ref 3.5–5.2)
Sodium: 138 mmol/L (ref 134–144)
Total Protein: 7.1 g/dL (ref 6.0–8.5)

## 2019-12-30 LAB — HEMOGLOBIN A1C
Est. average glucose Bld gHb Est-mCnc: 154 mg/dL
Hgb A1c MFr Bld: 7 % — ABNORMAL HIGH (ref 4.8–5.6)

## 2019-12-30 LAB — LIPID PANEL
Chol/HDL Ratio: 3.2 ratio (ref 0.0–4.4)
Cholesterol, Total: 133 mg/dL (ref 100–199)
HDL: 42 mg/dL (ref >39)
LDL Chol Calc (NIH): 55 mg/dL (ref 0–99)
Triglycerides: 223 mg/dL — ABNORMAL HIGH (ref 0–149)
VLDL Cholesterol Cal: 36 mg/dL (ref 5–40)

## 2019-12-30 LAB — TSH: TSH: 2.71 u[IU]/mL (ref 0.450–4.500)

## 2019-12-30 NOTE — Telephone Encounter (Signed)
Faxed signed release to Dr. Nolene Bernheim -[facility] office on 12/30/19 for records for Dr. Gardiner Rhyme to review.12/30/19 fsw

## 2020-01-08 ENCOUNTER — Telehealth: Payer: Self-pay | Admitting: Oncology

## 2020-01-08 NOTE — Telephone Encounter (Signed)
Received a new pt referral from Raiford Noble, PA for hx of melanoma. [name] [name] has been cld and scheduled to see Dr. Alen Blew on 02/12/20 at 11am. Pt aware to arrive 30 minutes early.

## 2020-01-16 ENCOUNTER — Inpatient Hospital Stay (HOSPITAL_COMMUNITY): Admission: RE | Admit: 2020-01-16 | Payer: Medicare HMO | Source: Ambulatory Visit

## 2020-01-19 ENCOUNTER — Telehealth: Payer: Self-pay | Admitting: Cardiology

## 2020-01-19 NOTE — Telephone Encounter (Signed)
Patient stated her heart rate is elevated when she walks and she lives at an assisted living and she has to go the dining room for her meals.   Per patient , 99 to 102.blood pressure have good per patient but did not give  RN the numbers    No shortness of breath while sitting.   RN  Reassured patient  - heart goes up when anxious, worried, or walking. The numbers that were given are not dangerous reading. Patient stated " I just got myself all worked up . I just wanted to make sure that it was okay.  patient thanked the nurse and stated I will do better and not worry.

## 2020-01-19 NOTE — Telephone Encounter (Signed)
STAT if HR is under 50 or over 120 (normal HR is 60-100 beats per minute)  1) What is your heart rate? 99  2) Do you have a log of your heart rate readings (document readings)? No  3) Do you have any other symptoms? Shortness of breath when walking a short distance, patient hasn't been monitoring heart rate but is going to. Please call.

## 2020-01-22 ENCOUNTER — Ambulatory Visit (HOSPITAL_COMMUNITY)
Admission: RE | Admit: 2020-01-22 | Discharge: 2020-01-22 | Disposition: A | Discharge status: Home / Self Care | Payer: Medicare HMO | Source: Ambulatory Visit | Attending: Cardiology | Admitting: Cardiology

## 2020-01-22 ENCOUNTER — Other Ambulatory Visit: Payer: Self-pay

## 2020-01-22 DIAGNOSIS — M79605 Pain in left leg: Secondary | ICD-10-CM | POA: Insufficient documentation

## 2020-01-22 DIAGNOSIS — M79604 Pain in right leg: Secondary | ICD-10-CM

## 2020-01-27 ENCOUNTER — Other Ambulatory Visit (HOSPITAL_COMMUNITY): Payer: Medicare HMO

## 2020-01-29 ENCOUNTER — Ambulatory Visit (INDEPENDENT_AMBULATORY_CARE_PROVIDER_SITE_OTHER): Payer: Medicare HMO

## 2020-01-29 ENCOUNTER — Telehealth: Payer: Self-pay

## 2020-01-29 DIAGNOSIS — R55 Syncope and collapse: Secondary | ICD-10-CM | POA: Diagnosis not present

## 2020-01-29 LAB — CUP PACEART REMOTE DEVICE CHECK
Date Time Interrogation Session: 20211222232407
Implantable Pulse Generator Implant Date: 20191002

## 2020-01-29 NOTE — Progress Notes (Signed)
Subjective:   [name] [name] is a 79 y.o. female who presents for an Initial Medicare Annual Wellness Visit.  I connected with Libertie today by telephone and verified that I am speaking with the correct person using two identifiers. Location patient: home Location provider: work Persons participating in the virtual visit: patient, Marine scientist.    I discussed the limitations, risks, security and privacy concerns of performing an evaluation and management service by telephone and the availability of in person appointments. I also discussed with the patient that there may be a patient responsible charge related to this service. The patient expressed understanding and verbally consented to this telephonic visit.    Interactive audio and video telecommunications were attempted between this provider and patient, however failed, due to patient having technical difficulties  We continued and completed visit with audio only.  Some vital signs may be absent or patient reported.   Time Spent with patient on telephone encounter: 25 minutes    Review of Systems     Cardiac Risk Factors include: advanced age (>55men, >43 women);hypertension;obesity (BMI >30kg/m2)     Objective:    Today's Vitals   02/02/20 1503  Weight: 250 lb (113.4 kg)  Height: 5\' 4"  (1.626 m)   Body mass index is 42.91 kg/m.  Advanced Directives 02/02/2020  Does Patient Have a Medical Advance Directive? Yes  Type of Advance Directive Living will;Healthcare Power of [place] in Chart? No - copy requested    Current Medications (verified) Outpatient Encounter Medications as of 02/02/2020  Medication Sig  . albuterol (VENTOLIN HFA) 108 (90 Base) MCG/ACT inhaler Inhale 1-2 puffs into the lungs every 6 (six) hours as needed for wheezing or shortness of breath.  Marland Kitchen amLODipine (NORVASC) 5 MG tablet Take 5 mg by mouth daily.  Marland Kitchen aspirin EC 81 MG tablet Take 81 mg by mouth daily. Swallow  whole.  Marland Kitchen atorvastatin (LIPITOR) 10 MG tablet Take 10 mg by mouth daily.  . calcium-vitamin D (OSCAL WITH D) 500-200 MG-UNIT tablet Take 1 tablet by mouth.  . camphor-menthol (SARNA) lotion Apply 1 application topically as needed for itching.  . cetirizine (ZYRTEC) 10 MG tablet Take 10 mg by mouth daily.  . clonazePAM (KLONOPIN) 0.5 MG tablet Take 0.5 tablets (0.25 mg total) by mouth every 12 (twelve) hours as needed.  Marland Kitchen EPINEPHrine 0.3 mg/0.3 mL IJ SOAJ injection SMARTSIG:0.3 Milligram(s) IM Once PRN  . ergocalciferol (VITAMIN D2) 1.25 MG (50000 UT) capsule Take 50,000 Units by mouth once a week.  . famotidine (PEPCID) 40 MG tablet Take 40 mg by mouth daily.  . hydrALAZINE (APRESOLINE) 25 MG tablet Take 25 mg by mouth 2 (two) times daily.  . hydrochlorothiazide (HYDRODIURIL) 25 MG tablet Take 25 mg by mouth daily.  Marland Kitchen losartan (COZAAR) 100 MG tablet Take 100 mg by mouth daily.  Marland Kitchen PARoxetine (PAXIL) 40 MG tablet Take 40 mg by mouth daily.   No facility-administered encounter medications on file as of 02/02/2020.    Allergies (verified) Penicillins, Sulfa antibiotics, Cymbalta [duloxetine hcl], Ivp dye [iodinated diagnostic agents], Codeine, Erythromycin, Levofloxacin, and Percocet [oxycodone-acetaminophen]   History: Past Medical History:  Diagnosis Date  . Allergy   . Anxiety   . Arthritis   . Blood transfusion without reported diagnosis   . Cancer ([place])    Melanoma on Great toe  . Cataract   . COPD (chronic obstructive pulmonary disease) ([place])   . Depression   . GERD (gastroesophageal reflux disease)   .  Hyperlipidemia   . Hypertension   . Osteoporosis   . Sleep apnea    Past Surgical History:  Procedure Laterality Date  . ABDOMINAL HYSTERECTOMY  1980  . CHOLECYSTECTOMY  1980  . MELANOMA EXCISION Right    Great Toe  . REPLACEMENT TOTAL KNEE Bilateral 2000   Family History  Problem Relation Age of Onset  . Cancer Mother   . Intellectual disability Mother   . Cancer  Father   . Early death Father   . Hypertension Father   . Cancer Sister   . Arthritis Maternal Grandmother   . Asthma Paternal Grandmother   . Asthma Paternal Grandfather    Social History   Socioeconomic History  . Marital status: Widowed    Spouse name: Not on file  . Number of children: Not on file  . Years of education: Not on file  . Highest education level: Not on file  Occupational History  . Not on file  Tobacco Use  . Smoking status: Former Smoker    Types: Cigarettes  . Smokeless tobacco: Never Used  Vaping Use  . Vaping Use: Never used  Substance and Sexual Activity  . Alcohol use: Not Currently  . Drug use: Not Currently  . Sexual activity: Not Currently  Other Topics Concern  . Not on file  Social History Narrative  . Not on file   Social Determinants of Health   Financial Resource Strain: Low Risk   . Difficulty of Paying Living Expenses: Not hard at all  Food Insecurity: No Food Insecurity  . Worried About Charity fundraiser in the Last Year: Never true  . Ran Out of Food in the Last Year: Never true  Transportation Needs: No Transportation Needs  . Lack of Transportation (Medical): No  . Lack of Transportation (Non-Medical): No  Physical Activity: Sufficiently Active  . Days of Exercise per Week: 3 days  . Minutes of Exercise per Session: 60 min  Stress: No Stress Concern Present  . Feeling of Stress : Not at all  Social Connections: Socially Isolated  . Frequency of Communication with Friends and Family: More than three times a week  . Frequency of Social Gatherings with Friends and Family: More than three times a week  . Attends Religious Services: Never  . Active Member of Clubs or Organizations: No  . Attends Archivist Meetings: Never  . Marital Status: Widowed    Tobacco Counseling Counseling given: Not Answered   Clinical Intake:  Pre-visit preparation completed: Yes  Pain : No/denies pain     Nutritional Status:  BMI > 30  Obese Nutritional Risks: None Diabetes: No  How often do you need to have someone help you when you read instructions, pamphlets, or other written materials from your doctor or pharmacy?: 1 - Never What is the last grade level you completed in school?: 2 yr degree  Diabetic?No  Interpreter Needed?: No  Information entered by :: Caroleen Hamman LPN   Activities of Daily Living In your present state of health, do you have any difficulty performing the following activities: 02/02/2020 12/17/2019  Hearing? N N  Vision? N N  Difficulty concentrating or making decisions? N N  Walking or climbing stairs? N N  Dressing or bathing? N N  Doing errands, shopping? N N  Preparing Food and eating ? N -  Using the Toilet? N -  In the past six months, have you accidently leaked urine? Y -  Comment wears depends -  Do you have problems with loss of bowel control? N -  Managing your Medications? N -  Managing your Finances? N -  Housekeeping or managing your Housekeeping? N -  Some recent data might be hidden    Patient Care Team: Delorse Limber as PCP - General (Family Medicine)  Indicate any recent Medical Services you may have received from other than [facility] providers in the past year (date may be approximate).     Assessment:   This is a routine wellness examination for [facility].  Hearing/Vision screen  Hearing Screening   125Hz  250Hz  500Hz  1000Hz  2000Hz  3000Hz  4000Hz  6000Hz  8000Hz   Right ear:           Left ear:           Comments: No issues  Vision Screening Comments: Wears glasses Last eye exam-03/2019  Dietary issues and exercise activities discussed: Current Exercise Habits: Home exercise routine, Type of exercise: strength training/weights, Time (Minutes): 60, Frequency (Times/Week): 3, Weekly Exercise (Minutes/Week): 180, Intensity: Mild, Exercise limited by: None identified  Goals    . Patient Stated     Increase activity      Depression Screen PHQ  2/9 Scores 02/02/2020 12/17/2019  PHQ - 2 Score 1 0    Fall Risk Fall Risk  02/02/2020 12/17/2019  Falls in the past year? 0 0  Number falls in past yr: 0 0  Injury with Fall? 0 0  Risk for fall due to : - Impaired mobility  Follow up Falls prevention discussed Falls evaluation completed    [place]:  Any stairs in or around the home? No  Home free of loose throw rugs in walkways, pet beds, electrical cords, etc? Yes  Adequate lighting in your home to reduce risk of falls? Yes   ASSISTIVE DEVICES UTILIZED TO PREVENT FALLS:  Life alert? No  Use of a cane, walker or w/c? Yes  Grab bars in the bathroom? Yes  Shower chair or bench in shower? Yes  Elevated toilet seat or a handicapped toilet? No   TIMED UP AND GO:  Was the test performed? No . Phone visit   Cognitive Function:Normal cognitive status assessed by this Nurse Health Advisor. No abnormalities found.          Immunizations Immunization History  Administered Date(s) Administered  . Influenza, High Dose Seasonal PF 11/07/2019  . Pneumococcal Conjugate-13 11/07/2018  . Pneumococcal Polysaccharide-23 02/07/2012    TDAP status: Due, Education has been provided regarding the importance of this vaccine. Advised may receive this vaccine at local pharmacy or Health Dept. Aware to provide a copy of the vaccination record if obtained from local pharmacy or Health Dept. Verbalized acceptance and understanding.  Flu Vaccine status: Up to date  Pneumococcal vaccine status: Up to date  Covid-19 vaccine status: Completed vaccines per patient . Specific dates unknown  Qualifies for Shingles Vaccine? Yes   Zostavax completed No   Shingrix Completed?: No.    Education has been provided regarding the importance of this vaccine. Patient has been advised to call insurance company to determine out of pocket expense if they have not yet received this vaccine. Advised may also receive vaccine at  local pharmacy or Health Dept. Verbalized acceptance and understanding.  Screening Tests Health Maintenance  Topic Date Due  . Hepatitis C Screening  Never done  . COVID-19 Vaccine (1) Never done  . DEXA SCAN  Never done  . TETANUS/TDAP  12/29/2020 (Originally 11/16/1959)  .  DTAP VACCINES (1) 07/21/2078 (Originally 01/15/1941)  . INFLUENZA VACCINE  Completed  . PNA vac Low Risk Adult  Completed  . DTaP/Tdap/Td  Discontinued    Health Maintenance  Health Maintenance Due  Topic Date Due  . Hepatitis C Screening  Never done  . COVID-19 Vaccine (1) Never done  . DEXA SCAN  Never done    Colorectal cancer screening: No longer required.   Mammogram ststus: Due- Patient declined.  Bone Density status:Patient states she has had in the past 2 years but does not know the date.  Lung Cancer Screening: (Low Dose CT Chest recommended if Age 65-80 years, 30 pack-year currently smoking OR have quit w/in 15years.) does not qualify.     Additional Screening:  Hepatitis C Screening: does not qualify  Vision Screening: Recommended annual ophthalmology exams for early detection of glaucoma and other disorders of the eye. Is the patient up to date with their annual eye exam?  Yes  Who is the provider or what is the name of the office in which the patient attends annual eye exams? Does not have a provider here If pt is not established with a provider, would they like to be referred to a provider to establish care? Yes . Referral ordered today  Dental Screening: Recommended annual dental exams for proper oral hygiene  Community Resource Referral / Chronic Care Management: CRR required this visit?  No   CCM required this visit?  No      Plan:     I have personally reviewed and noted the following in the patient's chart:   . Medical and social history . Use of alcohol, tobacco or illicit drugs  . Current medications and supplements . Functional ability and status . Nutritional  status . Physical activity . Advanced directives . List of other physicians . Hospitalizations, surgeries, and ER visits in previous 12 months . Vitals . Screenings to include cognitive, depression, and falls . Referrals and appointments  In addition, I have reviewed and discussed with patient certain preventive protocols, quality metrics, and best practice recommendations. A written personalized care plan for preventive services as well as general preventive health recommendations were provided to patient.   Due to this being a telephonic visit, the after visit summary with patients personalized plan was offered to patient via mail or my-chart. Patient preferred to pick up at office at next visit.   Marta Antu, LPN   [id number]  Nurse Health Advisor  Nurse Notes: Patient complains of stomach issues x 3 months. Appt made with PCP.

## 2020-01-29 NOTE — Telephone Encounter (Signed)
Message routed to Dr. Gardiner Rhyme and his nurse.   This a loop recorder patient. Results are sent to Dr. Sallyanne Kuster but primary cardiologist is Dr. Gardiner Rhyme.

## 2020-01-29 NOTE — Telephone Encounter (Signed)
Carelink alert received- Tachy episode detected lasting 6 seconds on 12/21 ~1930.  Vrate max was 182 bpm.    Spoke with pt, she does not recall having any symptoms at time of episode.  Pt also confirms compliance with medications as ordered.    Advised I would forward to Dr. Sallyanne Kuster, anticipate continued monitoring.  Noted she does not have an upcoming appt with Dr. Sallyanne Kuster so he may request she be scheduled.

## 2020-01-29 NOTE — Telephone Encounter (Signed)
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 Not AFib. No change in treatment plan. Keep appt as scheduled. Thanks.

## 2020-02-02 ENCOUNTER — Ambulatory Visit (INDEPENDENT_AMBULATORY_CARE_PROVIDER_SITE_OTHER): Payer: Medicare HMO

## 2020-02-02 ENCOUNTER — Other Ambulatory Visit: Payer: Self-pay

## 2020-02-02 VITALS — Ht 64.0 in | Wt 250.0 lb

## 2020-02-02 DIAGNOSIS — Z0000 Encounter for general adult medical examination without abnormal findings: Secondary | ICD-10-CM | POA: Diagnosis not present

## 2020-02-02 NOTE — Patient Instructions (Signed)
[name] [name] , Thank you for taking time to complete your Medicare Wellness Visit. I appreciate your ongoing commitment to your health goals. Please review the following plan we discussed and let me know if I can assist you in the future.   Screening recommendations/referrals: Colonoscopy: No longer required Mammogram: Declined today. Please call the office to schedule if you change your mind. Bone Density: Per our conversation, completed within the last 2 years but date unknown. Recommended yearly ophthalmology/optometry visit for glaucoma screening and checkup Recommended yearly dental visit for hygiene and checkup  Vaccinations: Influenza vaccine: U to date Pneumococcal vaccine: Completed vaccines Tdap vaccine: Discuss with pharmacy Shingles vaccine: Per our conversation,unable to take vaccine.  Covid-19:Completed vaccines but dates unknown.  Advanced directives: Please bring a copy for your chart  Conditions/risks identified: See problem list  Next appointment: Follow up in one year for your annual wellness visit    Preventive Care 65 Years and Older, Female Preventive care refers to lifestyle choices and visits with your health care provider that can promote health and wellness. What does preventive care include?  A yearly physical exam. This is also called an annual well check.  Dental exams once or twice a year.  Routine eye exams. Ask your health care provider how often you should have your eyes checked.  Personal lifestyle choices, including:  Daily care of your teeth and gums.  Regular physical activity.  Eating a healthy diet.  Avoiding tobacco and drug use.  Limiting alcohol use.  Practicing safe sex.  Taking low-dose aspirin every day.  Taking vitamin and mineral supplements as recommended by your health care provider. What happens during an annual well check? The services and screenings done by your health care provider during your annual well check  will depend on your age, overall health, lifestyle risk factors, and family history of disease. Counseling  Your health care provider may ask you questions about your:  Alcohol use.  Tobacco use.  Drug use.  Emotional well-being.  Home and relationship well-being.  Sexual activity.  Eating habits.  History of falls.  Memory and ability to understand (cognition).  Work and work Astronomer.  Reproductive health. Screening  You may have the following tests or measurements:  Height, weight, and BMI.  Blood pressure.  Lipid and cholesterol levels. These may be checked every 5 years, or more frequently if you are over 72 years old.  Skin check.  Lung cancer screening. You may have this screening every year starting at age 31 if you have a 30-pack-year history of smoking and currently smoke or have quit within the past 15 years.  Fecal occult blood test (FOBT) of the stool. You may have this test every year starting at age 45.  Flexible sigmoidoscopy or colonoscopy. You may have a sigmoidoscopy every 5 years or a colonoscopy every 10 years starting at age 65.  Hepatitis C blood test.  Hepatitis B blood test.  Sexually transmitted disease (STD) testing.  Diabetes screening. This is done by checking your blood sugar (glucose) after you have not eaten for a while (fasting). You may have this done every 1-3 years.  Bone density scan. This is done to screen for osteoporosis. You may have this done starting at age 62.  Mammogram. This may be done every 1-2 years. Talk to your health care provider about how often you should have regular mammograms. Talk with your health care provider about your test results, treatment options, and if necessary, the need for more  tests. Vaccines  Your health care provider may recommend certain vaccines, such as:  Influenza vaccine. This is recommended every year.  Tetanus, diphtheria, and acellular pertussis (Tdap, Td) vaccine. You may  need a Td booster every 10 years.  Zoster vaccine. You may need this after age 48.  Pneumococcal 13-valent conjugate (PCV13) vaccine. One dose is recommended after age 42.  Pneumococcal polysaccharide (PPSV23) vaccine. One dose is recommended after age 24. Talk to your health care provider about which screenings and vaccines you need and how often you need them. This information is not intended to replace advice given to you by your health care provider. Make sure you discuss any questions you have with your health care provider. Document Released: 02/19/2015 Document Revised: 10/13/2015 Document Reviewed: 11/24/2014 Elsevier Interactive Patient Education  2017 [employer] Prevention in the Home Falls can cause injuries. They can happen to people of all ages. There are many things you can do to make your home safe and to help prevent falls. What can I do on the outside of my home?  Regularly fix the edges of walkways and driveways and fix any cracks.  Remove anything that might make you trip as you walk through a door, such as a raised step or threshold.  Trim any bushes or trees on the path to your home.  Use bright outdoor lighting.  Clear any walking paths of anything that might make someone trip, such as rocks or tools.  Regularly check to see if handrails are loose or broken. Make sure that both sides of any steps have handrails.  Any raised decks and porches should have guardrails on the edges.  Have any leaves, snow, or ice cleared regularly.  Use sand or salt on walking paths during winter.  Clean up any spills in your garage right away. This includes oil or grease spills. What can I do in the bathroom?  Use night lights.  Install grab bars by the toilet and in the tub and shower. Do not use towel bars as grab bars.  Use non-skid mats or decals in the tub or shower.  If you need to sit down in the shower, use a plastic, non-slip stool.  Keep the floor  dry. Clean up any water that spills on the floor as soon as it happens.  Remove soap buildup in the tub or shower regularly.  Attach bath mats securely with double-sided non-slip rug tape.  Do not have throw rugs and other things on the floor that can make you trip. What can I do in the bedroom?  Use night lights.  Make sure that you have a light by your bed that is easy to reach.  Do not use any sheets or blankets that are too big for your bed. They should not hang down onto the floor.  Have a firm chair that has side arms. You can use this for support while you get dressed.  Do not have throw rugs and other things on the floor that can make you trip. What can I do in the kitchen?  Clean up any spills right away.  Avoid walking on wet floors.  Keep items that you use a lot in easy-to-reach places.  If you need to reach something above you, use a strong step stool that has a grab bar.  Keep electrical cords out of the way.  Do not use floor polish or wax that makes floors slippery. If you must use wax, use non-skid floor  wax.  Do not have throw rugs and other things on the floor that can make you trip. What can I do with my stairs?  Do not leave any items on the stairs.  Make sure that there are handrails on both sides of the stairs and use them. Fix handrails that are broken or loose. Make sure that handrails are as long as the stairways.  Check any carpeting to make sure that it is firmly attached to the stairs. Fix any carpet that is loose or worn.  Avoid having throw rugs at the top or bottom of the stairs. If you do have throw rugs, attach them to the floor with carpet tape.  Make sure that you have a light switch at the top of the stairs and the bottom of the stairs. If you do not have them, ask someone to add them for you. What else can I do to help prevent falls?  Wear shoes that:  Do not have high heels.  Have rubber bottoms.  Are comfortable and fit you  well.  Are closed at the toe. Do not wear sandals.  If you use a stepladder:  Make sure that it is fully opened. Do not climb a closed stepladder.  Make sure that both sides of the stepladder are locked into place.  Ask someone to hold it for you, if possible.  Clearly mark and make sure that you can see:  Any grab bars or handrails.  First and last steps.  Where the edge of each step is.  Use tools that help you move around (mobility aids) if they are needed. These include:  Canes.  Walkers.  Scooters.  Crutches.  Turn on the lights when you go into a dark area. Replace any light bulbs as soon as they burn out.  Set up your furniture so you have a clear path. Avoid moving your furniture around.  If any of your floors are uneven, fix them.  If there are any pets around you, be aware of where they are.  Review your medicines with your doctor. Some medicines can make you feel dizzy. This can increase your chance of falling. Ask your doctor what other things that you can do to help prevent falls. This information is not intended to replace advice given to you by your health care provider. Make sure you discuss any questions you have with your health care provider. Document Released: 11/19/2008 Document Revised: 07/01/2015 Document Reviewed: 02/27/2014 Elsevier Interactive Patient Education  2017 [employer].

## 2020-02-10 ENCOUNTER — Other Ambulatory Visit: Payer: Self-pay

## 2020-02-10 ENCOUNTER — Ambulatory Visit (INDEPENDENT_AMBULATORY_CARE_PROVIDER_SITE_OTHER): Payer: Medicare HMO | Admitting: Physician Assistant

## 2020-02-10 ENCOUNTER — Encounter: Payer: Self-pay | Admitting: Physician Assistant

## 2020-02-10 VITALS — BP 100/60 | HR 90 | Temp 98.3°F | Resp 16 | Ht 64.0 in | Wt 253.0 lb

## 2020-02-10 DIAGNOSIS — K219 Gastro-esophageal reflux disease without esophagitis: Secondary | ICD-10-CM | POA: Diagnosis not present

## 2020-02-10 DIAGNOSIS — F4323 Adjustment disorder with mixed anxiety and depressed mood: Secondary | ICD-10-CM

## 2020-02-10 MED ORDER — CLONAZEPAM 0.5 MG PO TABS: ORAL_TABLET | ORAL | 1 refills | Status: DC

## 2020-02-10 MED ORDER — PANTOPRAZOLE SODIUM 40 MG PO TBEC: 40.0000 mg | DELAYED_RELEASE_TABLET | Freq: Every day | ORAL | 0 refills | Status: DC

## 2020-02-10 MED ORDER — SACCHAROMYCES BOULARDII 250 MG PO CAPS
250.0000 mg | ORAL_CAPSULE | Freq: Two times a day (BID) | ORAL | 1 refills | Status: DC
Start: 2020-02-10 — End: 2020-06-03

## 2020-02-10 NOTE — Patient Instructions (Signed)
Please keep well-hydrated. Follow the dietary recommendations below.  I am starting you on a 2-week trial of Protonix in addition to the famotidine.  I have also sent in a daily probiotic for you.  I have made the discussed adjustments with your Klonopin.   Follow-up in 2 weeks as discussed.    Food Choices for Gastroesophageal Reflux Disease, Adult When you have gastroesophageal reflux disease (GERD), the foods you eat and your eating habits are very important. Choosing the right foods can help ease your discomfort. Think about working with a nutrition specialist (dietitian) to help you make good choices. What are tips for following this plan?  Meals  Choose healthy foods that are low in fat, such as fruits, vegetables, whole grains, low-fat dairy products, and lean meat, fish, and poultry.  Eat small meals often instead of 3 large meals a day. Eat your meals slowly, and in a place where you are relaxed. Avoid bending over or lying down until 2-3 hours after eating.  Avoid eating meals 2-3 hours before bed.  Avoid drinking a lot of liquid with meals.  Cook foods using methods other than frying. Bake, grill, or broil food instead.  Avoid or limit: ? Chocolate. ? Peppermint or spearmint. ? Alcohol. ? Pepper. ? Black and decaffeinated coffee. ? Black and decaffeinated tea. ? Bubbly (carbonated) soft drinks. ? Caffeinated energy drinks and soft drinks.  Limit high-fat foods such as: ? Fatty meat or fried foods. ? Whole milk, cream, butter, or ice cream. ? Nuts and nut butters. ? Pastries, donuts, and sweets made with butter or shortening.  Avoid foods that cause symptoms. These foods may be different for everyone. Common foods that cause symptoms include: ? Tomatoes. ? Oranges, lemons, and limes. ? Peppers. ? Spicy food. ? Onions and garlic. ? Vinegar. Lifestyle  Maintain a healthy weight. Ask your doctor what weight is healthy for you. If you need to lose weight, work  with your doctor to do so safely.  Exercise for at least 30 minutes for 5 or more days each week, or as told by your doctor.  Wear loose-fitting clothes.  Do not smoke. If you need help quitting, ask your doctor.  Sleep with the head of your bed higher than your feet. Use a wedge under the mattress or blocks under the bed frame to raise the head of the bed. Summary  When you have gastroesophageal reflux disease (GERD), food and lifestyle choices are very important in easing your symptoms.  Eat small meals often instead of 3 large meals a day. Eat your meals slowly, and in a place where you are relaxed.  Limit high-fat foods such as fatty meat or fried foods.  Avoid bending over or lying down until 2-3 hours after eating.  Avoid peppermint and spearmint, caffeine, alcohol, and chocolate. This information is not intended to replace advice given to you by your health care provider. Make sure you discuss any questions you have with your health care provider. Document Revised: 05/16/2018 Document Reviewed: 02/29/2016 Elsevier Patient Education  2020 [employer].

## 2020-02-10 NOTE — Progress Notes (Signed)
Patient presents to clinic today to discuss a couple of concerns.  Patient endorses being given her clonazepam early in the morning and late at night states before being in this facility she would typically take it in the morning and needed.  States she rarely needs medication at nighttime.  Denies any trouble sleeping.  Is wondering if we can adjust dosing instructions for the staff at her facility.  Patient also notes increased heartburn and indigestion with occasional abdominal cramping and loose, nonbloody stool.  Notes this seems to be worse when she has had a dairy product.  Denies any known history of lactose intolerance.  Denies melena, hematochezia or tenesmus.  Notes some nausea without vomiting.  Is taking famotidine at night but still with breakthrough symptoms.   Past Medical History:  Diagnosis Date  . Allergy   . Anxiety   . Arthritis   . Blood transfusion without reported diagnosis   . 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 Cataract   . COPD (chronic obstructive pulmonary disease) ([place])   . Depression   . GERD (gastroesophageal reflux disease)   . Hyperlipidemia   . Hypertension   . Osteoporosis   . Sleep apnea     Current Outpatient Medications on File Prior to Visit  Medication Sig Dispense Refill  . albuterol (VENTOLIN HFA) 108 (90 Base) MCG/ACT inhaler Inhale 1-2 puffs into the lungs every 6 (six) hours as needed for wheezing or shortness of breath. 18 g 3  . amLODipine (NORVASC) 5 MG tablet Take 5 mg by mouth daily.    Marland Kitchen aspirin EC 81 MG tablet Take 81 mg by mouth daily. Swallow whole.    Marland Kitchen atorvastatin (LIPITOR) 10 MG tablet Take 10 mg by mouth daily.    . calcium-vitamin D (OSCAL WITH D) 500-200 MG-UNIT tablet Take 1 tablet by mouth.    . camphor-menthol (SARNA) lotion Apply 1 application topically as needed for itching. 222 mL 0  . cetirizine (ZYRTEC) 10 MG tablet Take 10 mg by mouth daily.    Marland Kitchen EPINEPHrine 0.3 mg/0.3 mL IJ SOAJ injection SMARTSIG:0.3  Milligram(s) IM Once PRN    . ergocalciferol (VITAMIN D2) 1.25 MG (50000 UT) capsule Take 50,000 Units by mouth once a week.    . famotidine (PEPCID) 40 MG tablet Take 40 mg by mouth daily.    . hydrALAZINE (APRESOLINE) 25 MG tablet Take 25 mg by mouth 2 (two) times daily.    . hydrochlorothiazide (HYDRODIURIL) 25 MG tablet Take 25 mg by mouth daily.    Marland Kitchen losartan (COZAAR) 100 MG tablet Take 100 mg by mouth daily.    Marland Kitchen PARoxetine (PAXIL) 40 MG tablet Take 40 mg by mouth daily.     No current facility-administered medications on file prior to visit.    Allergies  Allergen Reactions  . Penicillins     childhood  . Sulfa Antibiotics     Childhood  . Cymbalta [Duloxetine Hcl]   . Ivp Dye [Iodinated Diagnostic Agents]   . Codeine Rash  . Erythromycin Rash  . Levofloxacin Rash  . Percocet [Oxycodone-Acetaminophen] Rash    Family History  Problem Relation Age of Onset  . Cancer Mother   . Intellectual disability Mother   . Cancer Father   . Early death Father   . Hypertension Father   . Cancer Sister   . Arthritis Maternal Grandmother   . Asthma Paternal Grandmother   . Asthma Paternal Grandfather     Social  History   Socioeconomic History  . Marital status: Widowed    Spouse name: Not on file  . Number of children: Not on file  . Years of education: Not on file  . Highest education level: Not on file  Occupational History  . Not on file  Tobacco Use  . Smoking status: Former Smoker    Types: Cigarettes  . Smokeless tobacco: Never Used  Vaping Use  . Vaping Use: Never used  Substance and Sexual Activity  . Alcohol use: Not Currently  . Drug use: Not Currently  . Sexual activity: Not Currently  Other Topics Concern  . Not on file  Social History Narrative  . Not on file   Social Determinants of Health   Financial Resource Strain: Low Risk   . Difficulty of Paying Living Expenses: Not hard at all  Food Insecurity: No Food Insecurity  . Worried About Paediatric nurse in the Last Year: Never true  . Ran Out of Food in the Last Year: Never true  Transportation Needs: No Transportation Needs  . Lack of Transportation (Medical): No  . Lack of Transportation (Non-Medical): No  Physical Activity: Sufficiently Active  . Days of Exercise per Week: 3 days  . Minutes of Exercise per Session: 60 min  Stress: No Stress Concern Present  . Feeling of Stress : Not at all  Social Connections: Socially Isolated  . Frequency of Communication with Friends and Family: More than three times a week  . Frequency of Social Gatherings with Friends and Family: More than three times a week  . Attends Religious Services: Never  . Active Member of Clubs or Organizations: No  . Attends Archivist Meetings: Never  . Marital Status: Widowed    Review of Systems - See HPI.  All other ROS are negative.  BP 100/60   Pulse 90   Temp 98.3 F (36.8 C) (Temporal)   Resp 16   Ht _0  (1.626 m)   Wt 253 lb (114.8 kg)   SpO2 96%   BMI 43.43 kg/m   Physical Exam Vitals reviewed.  Constitutional:      Appearance: Normal appearance.  HENT:     Head: Normocephalic and atraumatic.  Cardiovascular:     Rate and Rhythm: Normal rate and regular rhythm.     Pulses: Normal pulses.     Heart sounds: Normal heart sounds.  Pulmonary:     Effort: Pulmonary effort is normal.     Breath sounds: Normal breath sounds.  Abdominal:     General: Bowel sounds are normal. There is no distension.     Palpations: Abdomen is soft. There is no mass.     Tenderness: There is no abdominal tenderness. There is no guarding or rebound.  Musculoskeletal:     Cervical back: Neck supple.  Neurological:     General: No focal deficit present.     Mental Status: She is alert. Mental status is at baseline.  Psychiatric:        Mood and Affect: Mood normal.     Recent Results (from the past 2160 hour(s))  Lipid panel     Status: Abnormal   Collection Time: 12/29/19  2:34 PM   Result Value Ref Range   Cholesterol, Total 133 100 - 199 mg/dL   Triglycerides 223 (H) 0 - 149 mg/dL   HDL 42 >39 mg/dL   VLDL Cholesterol Cal 36 5 - 40 mg/dL   LDL Chol Calc (NIH) 55  0 - 99 mg/dL   Chol/HDL Ratio 3.2 0.0 - 4.4 ratio    Comment:                                   T. Chol/HDL Ratio                                             Men  Women                               1/2 Avg.Risk  3.4    3.3                                   Avg.Risk  5.0    4.4                                2X Avg.Risk  9.6    7.1                                3X Avg.Risk 23.4   11.0   Comprehensive metabolic panel     Status: Abnormal   Collection Time: 12/29/19  2:34 PM  Result Value Ref Range   Glucose 138 (H) 65 - 99 mg/dL   BUN 15 8 - 27 mg/dL   Creatinine, Ser 0.91 0.57 - 1.00 mg/dL   GFR calc non Af Amer 60 >59 mL/min/1.73   GFR calc Af Amer 69 >59 mL/min/1.73    Comment: **In accordance with recommendations from the NKF-ASN Task force,**   [facility] is in the process of updating its eGFR calculation to the   2021 CKD-EPI creatinine equation that estimates kidney function   without a race variable.    BUN/Creatinine Ratio 16 12 - 28   Sodium 138 134 - 144 mmol/L   Potassium 3.8 3.5 - 5.2 mmol/L   Chloride 101 96 - 106 mmol/L   CO2 21 20 - 29 mmol/L   Calcium 9.6 8.7 - 10.3 mg/dL   Total Protein 7.1 6.0 - 8.5 g/dL   Albumin 4.1 3.7 - 4.7 g/dL   Globulin, Total 3.0 1.5 - 4.5 g/dL   Albumin/Globulin Ratio 1.4 1.2 - 2.2   Bilirubin Total 0.3 0.0 - 1.2 mg/dL   Alkaline Phosphatase 112 44 - 121 IU/L    Comment:               **Please note reference interval change**   AST 29 0 - 40 IU/L   ALT 19 0 - 32 IU/L  CBC     Status: None   Collection Time: 12/29/19  2:34 PM  Result Value Ref Range   WBC 9.3 3.4 - 10.8 x10E3/uL   RBC 4.25 3.77 - 5.28 x10E6/uL   Hemoglobin 11.9 11.1 - 15.9 g/dL   Hematocrit 36.4 34.0 - 46.6 %   MCV 86 79 - 97 fL   MCH 28.0 26.6 - 33.0 pg   MCHC 32.7 31.5 -  35.7 g/dL   RDW 14.5 11.7 - 15.4 %   Platelets 312 150 -  450 x10E3/uL  TSH     Status: None   Collection Time: 12/29/19  2:34 PM  Result Value Ref Range   TSH 2.710 0.450 - 4.500 uIU/mL  Hemoglobin A1c     Status: Abnormal   Collection Time: 12/29/19  2:34 PM  Result Value Ref Range   Hgb A1c MFr Bld 7.0 (H) 4.8 - 5.6 %    Comment:          Prediabetes: 5.7 - 6.4          Diabetes: >6.4          Glycemic control for adults with diabetes: <7.0    Est. average glucose Bld gHb Est-mCnc 154 mg/dL  CUP PACEART REMOTE DEVICE CHECK     Status: None   Collection Time: 01/28/20 11:24 PM  Result Value Ref Range   Date Time Interrogation Session [id number]    Pulse Generator Manufacturer MER[employer]    Pulse Gen Model MEQ68 Reveal LINQ    Pulse Gen Serial Number [id number]    Clinic Name [facility]    Implantable Pulse Generator Type ICM/ILR    Implantable Pulse Generator Implant Date [id number]    Eval Rhythm ST at 115 bpm     Assessment/Plan:  1. Adjustment disorder with mixed anxiety and depressed mood We will have her continue SSRI daily.  Made adjustments to her dosing schedule for Klonopin to be given half a tablet each morning and then another half a tablet mid afternoon if needed.  Will monitor.  2. Gastroesophageal reflux disease without esophagitis We will add on trial of Protonix for the next 2 weeks.  Continue famotidine as directed for now.  GERD diet discussed.  Want her to avoid trigger foods and dairy.  Plan to follow-up in 2 weeks.   This visit occurred during the SARS-CoV-2 public health emergency.  Safety protocols were in place, including screening questions prior to the visit, additional usage of staff PPE, and extensive cleaning of exam room while observing appropriate contact time as indicated for disinfecting solutions.     Leeanne Rio, PA-C

## 2020-02-11 NOTE — Progress Notes (Signed)
Carelink Summary Report / Loop Recorder 

## 2020-02-12 ENCOUNTER — Inpatient Hospital Stay: Payer: Medicare HMO | Admitting: Oncology

## 2020-02-12 ENCOUNTER — Telehealth: Payer: Self-pay | Admitting: Oncology

## 2020-02-12 NOTE — Telephone Encounter (Signed)
Pt cld to r/s her new pt appt w/Dr. Clelia Croft to 1/25 at 11am. Pt states she's not feeling well.

## 2020-02-23 ENCOUNTER — Other Ambulatory Visit: Payer: Self-pay

## 2020-02-23 ENCOUNTER — Encounter: Payer: Self-pay | Admitting: Physician Assistant

## 2020-02-23 ENCOUNTER — Telehealth (INDEPENDENT_AMBULATORY_CARE_PROVIDER_SITE_OTHER): Payer: Medicare HMO | Admitting: Physician Assistant

## 2020-02-23 DIAGNOSIS — K219 Gastro-esophageal reflux disease without esophagitis: Secondary | ICD-10-CM

## 2020-02-23 DIAGNOSIS — R300 Dysuria: Secondary | ICD-10-CM

## 2020-02-23 MED ORDER — CEPHALEXIN 500 MG PO CAPS: 500.0000 mg | ORAL_CAPSULE | Freq: Two times a day (BID) | ORAL | 0 refills | Status: DC

## 2020-02-23 NOTE — Progress Notes (Signed)
I have discussed the procedure for the virtual visit with the patient who has given consent to proceed with assessment and treatment.   Croy Drumwright S Snow Peoples, CMA     

## 2020-02-23 NOTE — Progress Notes (Signed)
Virtual Visit via Video   I connected with patient on 02/23/20 at 10:00 AM EST by a video enabled telemedicine application and verified that I am speaking with the correct person using two identifiers.  Location patient: Home Location provider: [facility] Persons participating in the virtual visit: Patient, Provider, Cobb (Patina Moore)  I discussed the limitations of evaluation and management by telemedicine and the availability of in person appointments. The patient expressed understanding and agreed to proceed.  Subjective:   HPI:   Patient presents via [facility] for follow-up of GERD and to discuss new concern.  GERD -- At last visit, patient was started on Protonix daily. Endorses taking as directed and following GERD diet. Notes resolution of symptoms with medications. Is hydrating well and avoiding trigger foods.   Patient notes 2 days of urinary urgency and frequency with dysuria and cloudy urine. Denies fevers, chills, back or stomach pain. Denies hematuria. Has been trying to stay well-hydrated. Has history of UTI and states this feels identical to previous infections.   ROS:   See pertinent positives and negatives per HPI.  Patient Active Problem List   Diagnosis Date Noted  . Adjustment disorder with mixed anxiety and depressed mood 12/30/2019  . Diverticulosis of both small and large intestine without bleeding 12/30/2019  . Female stress incontinence 12/30/2019  . Coronary artery disease involving native heart without angina pectoris 12/18/2019  . Hypertension 12/18/2019  . History of malignant melanoma 12/18/2019  . Basal cell carcinoma of skin 12/18/2019  . History of surgery for cerebral aneurysm 12/18/2019    Social History   Tobacco Use  . Smoking status: Former Smoker    Types: Cigarettes  . Smokeless tobacco: Never Used  Substance Use Topics  . Alcohol use: Not Currently    Current Outpatient Medications:  .  albuterol (VENTOLIN  HFA) 108 (90 Base) MCG/ACT inhaler, Inhale 1-2 puffs into the lungs every 6 (six) hours as needed for wheezing or shortness of breath., Disp: 18 g, Rfl: 3 .  amLODipine (NORVASC) 5 MG tablet, Take 5 mg by mouth daily., Disp: , Rfl:  .  aspirin EC 81 MG tablet, Take 81 mg by mouth daily. Swallow whole., Disp: , Rfl:  .  atorvastatin (LIPITOR) 10 MG tablet, Take 10 mg by mouth daily., Disp: , Rfl:  .  calcium-vitamin D (OSCAL WITH D) 500-200 MG-UNIT tablet, Take 1 tablet by mouth., Disp: , Rfl:  .  camphor-menthol (SARNA) lotion, Apply 1 application topically as needed for itching., Disp: 222 mL, Rfl: 0 .  cetirizine (ZYRTEC) 10 MG tablet, Take 10 mg by mouth daily., Disp: , Rfl:  .  clonazePAM (KLONOPIN) 0.5 MG tablet, Take 1/2 tab PO each morning. Can take additional 1/2 tablet in mid-afternoon as needed., Disp: 30 tablet, Rfl: 1 .  EPINEPHrine 0.3 mg/0.3 mL IJ SOAJ injection, SMARTSIG:0.3 Milligram(s) IM Once PRN, Disp: , Rfl:  .  ergocalciferol (VITAMIN D2) 1.25 MG (50000 UT) capsule, Take 50,000 Units by mouth once a week., Disp: , Rfl:  .  famotidine (PEPCID) 40 MG tablet, Take 40 mg by mouth daily., Disp: , Rfl:  .  hydrALAZINE (APRESOLINE) 25 MG tablet, Take 25 mg by mouth 2 (two) times daily., Disp: , Rfl:  .  hydrochlorothiazide (HYDRODIURIL) 25 MG tablet, Take 25 mg by mouth daily., Disp: , Rfl:  .  losartan (COZAAR) 100 MG tablet, Take 100 mg by mouth daily., Disp: , Rfl:  .  pantoprazole (PROTONIX) 40 MG tablet, Take 1  tablet (40 mg total) by mouth daily., Disp: 30 tablet, Rfl: 0 .  PARoxetine (PAXIL) 40 MG tablet, Take 40 mg by mouth daily., Disp: , Rfl:  .  saccharomyces boulardii (FLORASTOR) 250 MG capsule, Take 1 capsule (250 mg total) by mouth 2 (two) times daily., Disp: 180 capsule, Rfl: 1  Allergies  Allergen Reactions  . Penicillins     childhood  . Sulfa Antibiotics     Childhood  . Cymbalta [Duloxetine Hcl]   . Ivp Dye [Iodinated Diagnostic Agents]   . Codeine Rash  .  Erythromycin Rash  . Levofloxacin Rash  . Percocet [Oxycodone-Acetaminophen] Rash    Objective:   There were no vitals taken for this visit.  Patient is well-developed, well-nourished in no acute distress.  Resting comfortably at home.  Head is normocephalic, atraumatic.  No labored breathing.  Speech is clear and coherent with logical content.  Patient is alert and oriented at baseline.   Assessment and Plan:   1. Gastroesophageal reflux disease without esophagitis Much improved. Plan to continue GERD diet and Protonix for now. Will reassess at next visit and start reducing dosing.   2. Dysuria Classic UTI symptoms. No alarm signs or symptoms. Patient with multiple antibiotic allergies. Does tolerate Keflex per her report. Plan to start Keflex 500 mg BID x 7 days. Increase fluids. Continue daily probiotic. Strict return and ER precautions discussed with patient.     Leeanne Rio, PA-C 02/23/2020

## 2020-02-23 NOTE — Patient Instructions (Signed)
Instructions sent to Patient's MyChart.

## 2020-02-26 ENCOUNTER — Other Ambulatory Visit (HOSPITAL_COMMUNITY): Payer: Medicare HMO

## 2020-02-27 ENCOUNTER — Ambulatory Visit: Payer: Medicare HMO | Admitting: Oncology

## 2020-03-01 ENCOUNTER — Ambulatory Visit (INDEPENDENT_AMBULATORY_CARE_PROVIDER_SITE_OTHER): Payer: Medicare HMO

## 2020-03-01 DIAGNOSIS — R55 Syncope and collapse: Secondary | ICD-10-CM | POA: Diagnosis not present

## 2020-03-02 ENCOUNTER — Inpatient Hospital Stay: Payer: Medicare HMO | Admitting: Oncology

## 2020-03-02 ENCOUNTER — Telehealth: Payer: Self-pay | Admitting: Oncology

## 2020-03-02 NOTE — Telephone Encounter (Signed)
Received a call to reschedule w/Dr. Alen Blew to 2/8 at 2pm.

## 2020-03-03 LAB — CUP PACEART REMOTE DEVICE CHECK
Date Time Interrogation Session: 20220124234830
Implantable Pulse Generator Implant Date: 20191002

## 2020-03-04 ENCOUNTER — Telehealth: Payer: Self-pay | Admitting: Physician Assistant

## 2020-03-04 NOTE — Telephone Encounter (Signed)
Pt called in stating that she needs her directions to be changed on her Klonopin, she states that because she is in [facility] we need to call them to change the orders stating that she can take the Klonopin PRN and she needs to wait (how many hours in between to take it again)  She states that when she takes it in the morning it makes her sleep so she will go back to sleep and sleep through lunch.  Please advise

## 2020-03-09 ENCOUNTER — Telehealth: Payer: Self-pay | Admitting: Physician Assistant

## 2020-03-09 NOTE — Telephone Encounter (Signed)
..  Home Health Certification or Plan of Care Tracking  Is this a Certification or Plan of Care?Yes  [place]  Order Number:    Has charge sheet been attached? no Where has form been placed:  Up front in [facility] bin

## 2020-03-11 NOTE — Progress Notes (Signed)
Carelink Summary Report / Loop Recorder 

## 2020-03-12 ENCOUNTER — Other Ambulatory Visit: Payer: Self-pay | Admitting: Emergency Medicine

## 2020-03-12 ENCOUNTER — Telehealth: Payer: Self-pay | Admitting: Physician Assistant

## 2020-03-12 DIAGNOSIS — K219 Gastro-esophageal reflux disease without esophagitis: Secondary | ICD-10-CM

## 2020-03-12 MED ORDER — PANTOPRAZOLE SODIUM 40 MG PO TBEC: 40.0000 mg | DELAYED_RELEASE_TABLET | Freq: Every day | ORAL | 1 refills | Status: DC

## 2020-03-12 NOTE — Telephone Encounter (Signed)
Pharmacy called about generic protonix - does [name] [name] want to keep her on this medication - please advise

## 2020-03-12 NOTE — Telephone Encounter (Signed)
Per last OV with Einar Pheasant, patient symptoms had improved with starting the Protonix. Continue medication, Refill sent to the pharmacy

## 2020-03-16 ENCOUNTER — Other Ambulatory Visit: Payer: Self-pay

## 2020-03-16 ENCOUNTER — Inpatient Hospital Stay: Payer: Medicare HMO | Attending: Oncology | Admitting: Oncology

## 2020-03-16 VITALS — BP 159/64 | HR 99 | Temp 97.8°F | Resp 20 | Ht 64.0 in | Wt 257.7 lb

## 2020-03-16 DIAGNOSIS — Z85820 Personal history of malignant melanoma of skin: Secondary | ICD-10-CM | POA: Diagnosis not present

## 2020-03-16 NOTE — Progress Notes (Signed)
Reason for the request:   Melanoma  HPI: I was asked by Assunta Curtis, PA-C  to evaluate [name] for the evaluation of melanoma.  She is a 80 year old woman with history of hyperlipidemia, hypertension among other comorbid conditions.  She is originally from [place] and retired in [place].  Her husband passed away and recently relocated to [place] area to live close to her son.  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  This was diagnosed at tenably 4 years ago and did not require any additional adjuvant therapy.  She reports that she had the 2 PET scans under the care of a local oncologist and has been following up with dermatology for surveillance. She has established care with dermatology and currently receiving regular surveillance at this time.  Clinically, she reports feeling reasonably fair without any complaints.  She denies any lymphadenopathy or any recent skin rash.  She denies any recent hospitalizations.  She did report GI distress and diarrhea which has resolved recently.  She does not report any headaches, blurry vision, syncope or seizures. Does not report any fevers, chills or sweats.  Does not report any cough, wheezing or hemoptysis.  Does not report any chest pain, palpitation, orthopnea or leg edema.  Does not report any nausea, vomiting or abdominal pain.  Does not report any constipation or diarrhea.  Does not report any skeletal complaints.    Does not report frequency, urgency or hematuria.  Does not report any skin rashes or lesions. Does not report any heat or cold intolerance.  Does not report any lymphadenopathy or petechiae.  Does not report any anxiety or depression.  Remaining review of systems is negative.    Past Medical History:  Diagnosis Date  . Allergy   . Anxiety   . Arthritis   . Blood transfusion without reported diagnosis   . Cancer ([place])    Melanoma on Great toe  . Cataract    . COPD (chronic obstructive pulmonary disease) ([place])   . Depression   . GERD (gastroesophageal reflux disease)   . Hyperlipidemia   . Hypertension   . Osteoporosis   . Sleep apnea   :  Past Surgical History:  Procedure Laterality Date  . ABDOMINAL HYSTERECTOMY  1980  . CHOLECYSTECTOMY  1980  . MELANOMA EXCISION Right    Great Toe  . REPLACEMENT TOTAL KNEE Bilateral 2000  :   Current Outpatient Medications:  .  albuterol (VENTOLIN HFA) 108 (90 Base) MCG/ACT inhaler, Inhale 1-2 puffs into the lungs every 6 (six) hours as needed for wheezing or shortness of breath., Disp: 18 g, Rfl: 3 .  amLODipine (NORVASC) 5 MG tablet, Take 5 mg by mouth daily., Disp: , Rfl:  .  aspirin EC 81 MG tablet, Take 81 mg by mouth daily. Swallow whole., Disp: , Rfl:  .  atorvastatin (LIPITOR) 10 MG tablet, Take 10 mg by mouth daily., Disp: , Rfl:  .  calcium-vitamin D (OSCAL WITH D) 500-200 MG-UNIT tablet, Take 1 tablet by mouth., Disp: , Rfl:  .  camphor-menthol (SARNA) lotion, Apply 1 application topically as needed for itching., Disp: 222 mL, Rfl: 0 .  cetirizine (ZYRTEC) 10 MG tablet, Take 10 mg by mouth daily., Disp: , Rfl:  .  clonazePAM (KLONOPIN) 0.5 MG tablet, Take 1/2 tab PO each morning. Can take additional 1/2 tablet in mid-afternoon as needed., Disp: 30 tablet, Rfl: 1 .  EPINEPHrine 0.3 mg/0.3 mL  IJ SOAJ injection, SMARTSIG:0.3 Milligram(s) IM Once PRN, Disp: , Rfl:  .  ergocalciferol (VITAMIN D2) 1.25 MG (50000 UT) capsule, Take 50,000 Units by mouth once a week., Disp: , Rfl:  .  famotidine (PEPCID) 40 MG tablet, Take 40 mg by mouth daily., Disp: , Rfl:  .  hydrALAZINE (APRESOLINE) 25 MG tablet, Take 25 mg by mouth 2 (two) times daily., Disp: , Rfl:  .  hydrochlorothiazide (HYDRODIURIL) 25 MG tablet, Take 25 mg by mouth daily., Disp: , Rfl:  .  losartan (COZAAR) 100 MG tablet, Take 100 mg by mouth daily., Disp: , Rfl:  .  pantoprazole (PROTONIX) 40 MG tablet, Take 1 tablet (40 mg total) by  mouth daily., Disp: 90 tablet, Rfl: 1 .  PARoxetine (PAXIL) 40 MG tablet, Take 40 mg by mouth daily., Disp: , Rfl:  .  saccharomyces boulardii (FLORASTOR) 250 MG capsule, Take 1 capsule (250 mg total) by mouth 2 (two) times daily., Disp: 180 capsule, Rfl: 1:  Allergies  Allergen Reactions  . Penicillins     childhood  . Sulfa Antibiotics     Childhood  . Cymbalta [Duloxetine Hcl]   . Ivp Dye [Iodinated Diagnostic Agents]   . Codeine Rash  . Erythromycin Rash  . Levofloxacin Rash  . Percocet [Oxycodone-Acetaminophen] Rash  :  Family History  Problem Relation Age of Onset  . Cancer Mother   . Intellectual disability Mother   . Cancer Father   . Early death Father   . Hypertension Father   . Cancer Sister   . Arthritis Maternal Grandmother   . Asthma Paternal Grandmother   . Asthma Paternal Grandfather   :  Social History   Socioeconomic History  . Marital status: Widowed    Spouse name: Not on file  . Number of children: Not on file  . Years of education: Not on file  . Highest education level: Not on file  Occupational History  . Not on file  Tobacco Use  . Smoking status: Former Smoker    Types: Cigarettes  . Smokeless tobacco: Never Used  Vaping Use  . Vaping Use: Never used  Substance and Sexual Activity  . Alcohol use: Not Currently  . Drug use: Not Currently  . Sexual activity: Not Currently  Other Topics Concern  . Not on file  Social History Narrative  . Not on file   Social Determinants of Health   Financial Resource Strain: Low Risk   . Difficulty of Paying Living Expenses: Not hard at all  Food Insecurity: No Food Insecurity  . Worried About Charity fundraiser in the Last Year: Never true  . Ran Out of Food in the Last Year: Never true  Transportation Needs: No Transportation Needs  . Lack of Transportation (Medical): No  . Lack of Transportation (Non-Medical): No  Physical Activity: Sufficiently Active  . Days of Exercise per Week: 3 days   . Minutes of Exercise per Session: 60 min  Stress: No Stress Concern Present  . Feeling of Stress : Not at all  Social Connections: Socially Isolated  . Frequency of Communication with Friends and Family: More than three times a week  . Frequency of Social Gatherings with Friends and Family: More than three times a week  . Attends Religious Services: Never  . Active Member of Clubs or Organizations: No  . Attends Archivist Meetings: Never  . Marital Status: Widowed  Intimate Partner Violence: Not At Risk  . Fear of Current or  Ex-Partner: No  . Emotionally Abused: No  . Physically Abused: No  . Sexually Abused: No  :  Pertinent items are noted in HPI.  Exam: Blood pressure (!) 159/64, pulse 99, temperature 97.8 F (36.6 C), temperature source Tympanic, resp. rate 20, height 5\' 4"  (1.626 m), weight 257 lb 11.2 oz (116.9 kg), SpO2 96 %.   ECOG 1  General appearance: alert and cooperative appeared without distress. Head: atraumatic without any abnormalities. Eyes: conjunctivae/corneas clear. PERRL.  Sclera anicteric. Throat: lips, mucosa, and tongue normal; without oral thrush or ulcers. Resp: clear to auscultation bilaterally without rhonchi, wheezes or dullness to percussion. Cardio: regular rate and rhythm, S1, S2 normal, no murmur, click, rub or gallop GI: soft, non-tender; bowel sounds normal; no masses,  no organomegaly Skin: Skin color, texture, turgor normal. No rashes or lesions Lymph nodes: Cervical, supraclavicular, and axillary nodes normal. Neurologic: Grossly normal without any motor, sensory or deep tendon reflexes. Musculoskeletal: No joint deformity or effusion.     Assessment and Plan:    80 year old with:  1.  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  She underwent surgical resection with amputation and has been on active surveillance since that time.  She has not required any  additional treatment and no evidence of relapse.  The natural course of this disease was reviewed at this time and management options were discussed.  At this time, appears to be that she is more than 2 years from her diagnosis and had multiple imaging studies that did not show any evidence of metastatic disease.  At this time, the utility of any additional laboratory or radiographic evidence is of low yield.  I recommended continued follow-up with dermatology but no additional oncology follow-up at this time.  If she develops symptoms in the future, imaging studies can be obtained.  Treatment options including immunotherapy as well as oral targeted therapy if she harbors a targetable mutation.  2. Dermatology surveillance: She is already established with dermatology locally.  I recommended continued follow-up given her increased risk of other skin cancer.   3.  Follow-up: I am happy to see her in the future as needed.  45  minutes were dedicated to this visit. The time was spent on reviewing iscussing treatment options, the natural course of this disease and answering questions regarding future plan.      A copy of this consult has been forwarded to the requesting physician.

## 2020-03-18 ENCOUNTER — Other Ambulatory Visit: Payer: Self-pay

## 2020-03-18 ENCOUNTER — Ambulatory Visit (HOSPITAL_COMMUNITY): Payer: Medicare HMO | Attending: Cardiology

## 2020-03-18 DIAGNOSIS — R0602 Shortness of breath: Secondary | ICD-10-CM | POA: Diagnosis not present

## 2020-03-18 LAB — ECHOCARDIOGRAM COMPLETE
AR max vel: 1.84 cm2
AV Area VTI: 1.77 cm2
AV Area mean vel: 1.91 cm2
AV Mean grad: 10 mmHg
AV Peak grad: 15.5 mmHg
Ao pk vel: 1.97 m/s
Area-P 1/2: 4.49 cm2
S' Lateral: 3.5 cm

## 2020-03-30 ENCOUNTER — Telehealth: Payer: Self-pay | Admitting: Physician Assistant

## 2020-03-30 DIAGNOSIS — F4323 Adjustment disorder with mixed anxiety and depressed mood: Secondary | ICD-10-CM

## 2020-03-30 NOTE — Telephone Encounter (Signed)
Pharmacy called requesting a refill on patient's  Clonazepam

## 2020-03-30 NOTE — Telephone Encounter (Signed)
Clonazepam last rx 02/10/20 #30 1 RF LOV: 02/23/20 GERD, Dysuria  Patient sent a message prior wanting to change the directions on her Clonazepam rx. Before Einar Pheasant wrote Clonazepam for 1/2 tab in morning and 1/2 tab in afternoon as needed. Since patient states in assisted living they have to give her medications as directed. She states when she takes 1/2 tab in morning makes her sleepy and she sleeps during lunch. She wanted to cut back dose to just as needed.

## 2020-03-30 NOTE — Telephone Encounter (Signed)
Message sent with refill request for direction changes

## 2020-03-31 MED ORDER — CLONAZEPAM 0.5 MG PO TABS: ORAL_TABLET | ORAL | 1 refills | Status: DC

## 2020-04-01 ENCOUNTER — Ambulatory Visit (INDEPENDENT_AMBULATORY_CARE_PROVIDER_SITE_OTHER): Payer: Medicare HMO

## 2020-04-01 DIAGNOSIS — R55 Syncope and collapse: Secondary | ICD-10-CM

## 2020-04-01 NOTE — Telephone Encounter (Signed)
Patient also needs a refill on clonazapan .5 mg.(scheduled dose)  - she takes 1/2 tablet every morning - already received refill on the PRN dose.

## 2020-04-01 NOTE — Telephone Encounter (Signed)
Patient needs this as well.

## 2020-04-02 NOTE — Telephone Encounter (Signed)
This message was sent in error. Rx changed and filled already.

## 2020-04-05 ENCOUNTER — Ambulatory Visit: Payer: Medicare HMO | Admitting: Podiatry

## 2020-04-05 ENCOUNTER — Telehealth: Payer: Self-pay | Admitting: Physician Assistant

## 2020-04-05 LAB — CUP PACEART REMOTE DEVICE CHECK
Date Time Interrogation Session: 20220226234728
Implantable Pulse Generator Implant Date: 20191002

## 2020-04-05 NOTE — Telephone Encounter (Signed)
Pharmacy called - They need a prescription that includes the morning dose and 1/2 tablet as needed

## 2020-04-05 NOTE — Telephone Encounter (Signed)
I have placed FL2 forms in the bin upfront from [facility] with a charge sheet.

## 2020-04-06 ENCOUNTER — Encounter: Payer: Self-pay | Admitting: *Deleted

## 2020-04-06 ENCOUNTER — Ambulatory Visit (INDEPENDENT_AMBULATORY_CARE_PROVIDER_SITE_OTHER): Payer: Medicare HMO | Admitting: Cardiology

## 2020-04-06 ENCOUNTER — Other Ambulatory Visit: Payer: Self-pay

## 2020-04-06 ENCOUNTER — Encounter: Payer: Self-pay | Admitting: Cardiology

## 2020-04-06 VITALS — BP 109/60 | HR 87 | Ht 64.0 in | Wt 253.0 lb

## 2020-04-06 DIAGNOSIS — Z0279 Encounter for issue of other medical certificate: Secondary | ICD-10-CM

## 2020-04-06 DIAGNOSIS — R0602 Shortness of breath: Secondary | ICD-10-CM

## 2020-04-06 DIAGNOSIS — I10 Essential (primary) hypertension: Secondary | ICD-10-CM | POA: Diagnosis not present

## 2020-04-06 DIAGNOSIS — R55 Syncope and collapse: Secondary | ICD-10-CM | POA: Diagnosis not present

## 2020-04-06 DIAGNOSIS — E785 Hyperlipidemia, unspecified: Secondary | ICD-10-CM

## 2020-04-06 DIAGNOSIS — I739 Peripheral vascular disease, unspecified: Secondary | ICD-10-CM

## 2020-04-06 DIAGNOSIS — R079 Chest pain, unspecified: Secondary | ICD-10-CM

## 2020-04-06 NOTE — Progress Notes (Signed)
Cardiology Office Note:    Date:  04/06/2020   ID:  [name] [name], DOB 1940/09/19, MRN [id number]  PCP:  Patient, No Pcp Per  Cardiologist:  No primary care provider on file.  Electrophysiologist:  None   Referring MD: Brunetta Jeans, PA-C   No chief complaint on file.   History of Present Illness:    [name] [name] is a 80 y.o. female with a hx of melanoma, COPD, hypertension, hyperlipidemia, OSA who presents for follow-up.  She was referred by Raiford Noble, PA for evaluation of hypertension, initially seen on 12/30/2019.  She recently moved to [place] from [place].  Previously followed with Dr. Nolene Bernheim in [place], [place].  Reports that had episode of syncope 2 years ago and had loop recorder inserted.  Reports has not been interrogated in a while.  She denies any chest pain but does state that she gets short of breath with exertion.  She has been doing physical therapy.  She denies any lightheadedness.  Does report she has intermittent lower extremity edema.  States that she has been having pain in her legs.  Reports compliance with her CPAP.  Former smoker, quit in 1988.  No history of heart disease in her immediate family.  Echocardiogram 03/18/2020 showed normal biventricular function, no significant valvular disease.  Since last clinic visit, she reports that she is continued have shortness of breath with exertion.  States that it has been getting worse, feels short of breath with minimal exertion such as walking across the room.  Also reports that she has been having pressure in the center of her chest.  Has attributed it to indigestion, seems to be correlated to when she eats.  She denies any recent lightheadedness or syncope.  Denies pain in her legs when she walks.    Past Medical History:  Diagnosis Date  . Allergy   . Anxiety   . Arthritis   . Blood transfusion without reported diagnosis   . Cancer ([place])    Melanoma on Great toe  . Cataract   . COPD  (chronic obstructive pulmonary disease) ([place])   . Depression   . GERD (gastroesophageal reflux disease)   . Hyperlipidemia   . Hypertension   . Osteoporosis   . Sleep apnea     Past Surgical History:  Procedure Laterality Date  . ABDOMINAL HYSTERECTOMY  1980  . CHOLECYSTECTOMY  1980  . MELANOMA EXCISION Right    Great Toe  . REPLACEMENT TOTAL KNEE Bilateral 2000    Current Medications: Current Meds  Medication Sig  . albuterol (VENTOLIN HFA) 108 (90 Base) MCG/ACT inhaler Inhale 1-2 puffs into the lungs every 6 (six) hours as needed for wheezing or shortness of breath.  Marland Kitchen amLODipine (NORVASC) 5 MG tablet Take 5 mg by mouth daily.  Marland Kitchen aspirin EC 81 MG tablet Take 81 mg by mouth daily. Swallow whole.  Marland Kitchen atorvastatin (LIPITOR) 10 MG tablet Take 10 mg by mouth daily.  . calcium-vitamin D (OSCAL WITH D) 500-200 MG-UNIT tablet Take 1 tablet by mouth.  . camphor-menthol (SARNA) lotion Apply 1 application topically as needed for itching.  . cetirizine (ZYRTEC) 10 MG tablet Take 10 mg by mouth daily.  . clonazePAM (KLONOPIN) 0.5 MG tablet Take 0.5 mg tablet by mouth as needed  . EPINEPHrine 0.3 mg/0.3 mL IJ SOAJ injection SMARTSIG:0.3 Milligram(s) IM Once PRN  . ergocalciferol (VITAMIN D2) 1.25 MG (50000 UT) capsule Take 50,000 Units by mouth once a week.  . famotidine (PEPCID) 40 MG  tablet Take 40 mg by mouth daily.  . hydrALAZINE (APRESOLINE) 25 MG tablet Take 25 mg by mouth 2 (two) times daily.  . hydrochlorothiazide (HYDRODIURIL) 25 MG tablet Take 25 mg by mouth daily.  Marland Kitchen losartan (COZAAR) 100 MG tablet Take 100 mg by mouth daily.  . pantoprazole (PROTONIX) 40 MG tablet Take 1 tablet (40 mg total) by mouth daily.  Marland Kitchen PARoxetine (PAXIL) 40 MG tablet Take 40 mg by mouth daily.  Marland Kitchen saccharomyces boulardii (FLORASTOR) 250 MG capsule Take 1 capsule (250 mg total) by mouth 2 (two) times daily.     Allergies:   Penicillins, Sulfa antibiotics, Cymbalta [duloxetine hcl], Ivp dye [iodinated  diagnostic agents], Codeine, Erythromycin, Levofloxacin, and Percocet [oxycodone-acetaminophen]   Social History   Socioeconomic History  . Marital status: Widowed    Spouse name: Not on file  . Number of children: Not on file  . Years of education: Not on file  . Highest education level: Not on file  Occupational History  . Not on file  Tobacco Use  . Smoking status: Former Smoker    Types: Cigarettes  . Smokeless tobacco: Never Used  Vaping Use  . Vaping Use: Never used  Substance and Sexual Activity  . Alcohol use: Not Currently  . Drug use: Not Currently  . Sexual activity: Not Currently  Other Topics Concern  . Not on file  Social History Narrative  . Not on file   Social Determinants of Health   Financial Resource Strain: Low Risk   . Difficulty of Paying Living Expenses: Not hard at all  Food Insecurity: No Food Insecurity  . Worried About Charity fundraiser in the Last Year: Never true  . Ran Out of Food in the Last Year: Never true  Transportation Needs: No Transportation Needs  . Lack of Transportation (Medical): No  . Lack of Transportation (Non-Medical): No  Physical Activity: Sufficiently Active  . Days of Exercise per Week: 3 days  . Minutes of Exercise per Session: 60 min  Stress: No Stress Concern Present  . Feeling of Stress : Not at all  Social Connections: Socially Isolated  . Frequency of Communication with Friends and Family: More than three times a week  . Frequency of Social Gatherings with Friends and Family: More than three times a week  . Attends Religious Services: Never  . Active Member of Clubs or Organizations: No  . Attends Archivist Meetings: Never  . Marital Status: Widowed     Family History: The patient's family history includes Arthritis in her maternal grandmother; Asthma in her paternal grandfather and paternal grandmother; Cancer in her father, mother, and sister; Early death in her father; Hypertension in her  father; Intellectual disability in her mother.  ROS:   Please see the history of present illness.     All other systems reviewed and are negative.  EKGs/Labs/Other Studies Reviewed:    The following studies were reviewed today:   EKG:  EKG is not ordered today.  The ekg ordered most recently demonstrates normal sinus rhythm, rate 98, poor R wave progression, left axis deviation, T wave inversions in leads I/aVL  Recent Labs: 12/29/2019: ALT 19; BUN 15; Creatinine, Ser 0.91; Hemoglobin 11.9; Platelets 312; Potassium 3.8; Sodium 138; TSH 2.710  Recent Lipid Panel    Component Value Date/Time   CHOL 133 12/29/2019 1434   TRIG 223 (H) 12/29/2019 1434   HDL 42 12/29/2019 1434   CHOLHDL 3.2 12/29/2019 1434   LDLCALC 55  12/29/2019 1434    Physical Exam:    VS:  BP 109/60   Pulse 87   Ht 5\' 4"  (1.626 m)   Wt 253 lb (114.8 kg)   SpO2 96%   BMI 43.43 kg/m     Wt Readings from Last 3 Encounters:  04/06/20 253 lb (114.8 kg)  03/16/20 257 lb 11.2 oz (116.9 kg)  02/10/20 253 lb (114.8 kg)     GEN:  Well nourished, well developed in no acute distress HEENT: Normal NECK: No JVD; No carotid bruits CARDIAC:RRR, 2 out of 6 systolic murmur RESPIRATORY:  Clear to auscultation without rales, wheezing or rhonchi  ABDOMEN: Soft, non-tender, non-distended MUSCULOSKELETAL:  No edema; No deformity  SKIN: Warm and dry NEUROLOGIC:  Alert and oriented x 3 PSYCHIATRIC:  Normal affect   ASSESSMENT:    1. Chest pain of uncertain etiology   2. Shortness of breath   3. Syncope and collapse   4. Essential hypertension   5. Hyperlipidemia, unspecified hyperlipidemia type   6. PAD (peripheral artery disease) (HCC)    PLAN:    Chest pain/dyspnea: Reports atypical chest pain but also having dyspnea with minimal exertion, could represent anginal equivalent.  She does have CAD risk factors including hypertension and hyperlipidemia, and has known PAD.  Echocardiogram 03/18/2020 showed normal  biventricular function, no significant valvular disease. -Evaluate for ischemia with Lexiscan Myoview  Syncope: Reports syncopal episode 2 years ago, had loop recorder placed.  Established with device clinic for loop interrogations, have been unremarkable  Hypertension: On hydralazine 25 mg twice daily, hydrochlorothiazide 12 5 mg daily, losartan 100 mg daily, amlodipine 5 mg daily.  Appears controlled  Hyperlipidemia: On atorvastatin 10 mg daily.  LDL 55 on 12/29/2019  PAD: ABIs show moderate disease on right (0.74) and mild left (0.89).  Duplex shows 50 to 74% stenosis in common femoral artery bilaterally, 30 to 49% stenosis in SFA.  Denies any symptoms of claudication -Continue ASA, statin   RTC in 3 months   Shared Decision Making/Informed Consent The risks [chest pain, shortness of breath, cardiac arrhythmias, dizziness, blood pressure fluctuations, myocardial infarction, stroke/transient ischemic attack, nausea, vomiting, allergic reaction, radiation exposure, metallic taste sensation and life-threatening complications (estimated to be 1 in 10,000)], benefits (risk stratification, diagnosing coronary artery disease, treatment guidance) and alternatives of a nuclear stress test were discussed in detail with [name] [name] and she agrees to proceed.      Medication Adjustments/Labs and Tests Ordered: Current medicines are reviewed at length with the patient today.  Concerns regarding medicines are outlined above.  Orders Placed This Encounter  Procedures  . MYOCARDIAL PERFUSION IMAGING   No orders of the defined types were placed in this encounter.   Patient Instructions  Medication Instructions:  Your physician recommends that you continue on your current medications as directed. Please refer to the Current Medication list given to you today.  *If you need a refill on your cardiac medications before your next appointment, please call your pharmacy*  Testing/Procedures: Your  physician has requested that you have a [place] (2 day) at [facility] ([address]). For further information please visit [email or web address]. Please follow instruction sheet, as given.   How to prepare for your Myocardial Perfusion Test:  Do not eat or drink 3 hours prior to your test, except you may have water.  Do not consume products containing caffeine (regular or decaffeinated) 12 hours prior to your test. (ex: coffee, chocolate, sodas, tea).  Do  bring a list of your current medications with you.  If not listed below, you may take your medications as normal.  Do wear comfortable clothes (no dresses or overalls) and walking shoes, tennis shoes preferred (No heels or open toe shoes are allowed).  Do NOT wear cologne, perfume, aftershave, or lotions (deodorant is allowed).  The test will take approximately 3 to 4 hours to complete  If these instructions are not followed, your test will have to be rescheduled.  Follow-Up: At [facility], you and your health needs are our priority.  As part of our continuing mission to provide you with exceptional heart care, we have created designated Provider Care Teams.  These Care Teams include your primary Cardiologist (physician) and Advanced Practice Providers (APPs -  Physician Assistants and Nurse Practitioners) who all work together to provide you with the care you need, when you need it.  We recommend signing up for the patient portal called "MyChart".  Sign up information is provided on this After Visit Summary.  MyChart is used to connect with patients for Virtual Visits (Telemedicine).  Patients are able to view lab/test results, encounter notes, upcoming appointments, etc.  Non-urgent messages can be sent to your provider as well.   To learn more about what you can do with MyChart, go to [email or web address].    Your next appointment:   3 month(s)  The format for your next appointment:   In  Person  Provider:   Oswaldo Milian, MD       Signed, Donato Heinz, MD  04/06/2020 1:46 PM    [place]

## 2020-04-06 NOTE — Telephone Encounter (Signed)
Form in your folder for review and signature

## 2020-04-06 NOTE — Telephone Encounter (Signed)
Tried calling the patient to address concerns. Could not get through on the number that is on file.

## 2020-04-06 NOTE — Patient Instructions (Addendum)
Medication Instructions:  Your physician recommends that you continue on your current medications as directed. Please refer to the Current Medication list given to you today.  *If you need a refill on your cardiac medications before your next appointment, please call your pharmacy*  Testing/Procedures: Your physician has requested that you have a [place] (2 day) at [facility] ([address]). For further information please visit [email or web address]. Please follow instruction sheet, as given.   How to prepare for your Myocardial Perfusion Test:  Do not eat or drink 3 hours prior to your test, except you may have water.  Do not consume products containing caffeine (regular or decaffeinated) 12 hours prior to your test. (ex: coffee, chocolate, sodas, tea).  Do bring a list of your current medications with you.  If not listed below, you may take your medications as normal.  Do wear comfortable clothes (no dresses or overalls) and walking shoes, tennis shoes preferred (No heels or open toe shoes are allowed).  Do NOT wear cologne, perfume, aftershave, or lotions (deodorant is allowed).  The test will take approximately 3 to 4 hours to complete  If these instructions are not followed, your test will have to be rescheduled.  Follow-Up: At [facility], you and your health needs are our priority.  As part of our continuing mission to provide you with exceptional heart care, we have created designated Provider Care Teams.  These Care Teams include your primary Cardiologist (physician) and Advanced Practice Providers (APPs -  Physician Assistants and Nurse Practitioners) who all work together to provide you with the care you need, when you need it.  We recommend signing up for the patient portal called "MyChart".  Sign up information is provided on this After Visit Summary.  MyChart is used to connect with patients for Virtual Visits (Telemedicine).  Patients are able  to view lab/test results, encounter notes, upcoming appointments, etc.  Non-urgent messages can be sent to your provider as well.   To learn more about what you can do with MyChart, go to [email or web address].    Your next appointment:   3 month(s)  The format for your next appointment:   In Person  Provider:   Oswaldo Milian, MD

## 2020-04-07 NOTE — Telephone Encounter (Signed)
Form completed and placed in basket  

## 2020-04-07 NOTE — Telephone Encounter (Signed)
I have faxed over the forms and sent to scan

## 2020-04-09 NOTE — Progress Notes (Signed)
Carelink Summary Report / Loop Recorder 

## 2020-04-19 ENCOUNTER — Telehealth: Payer: Self-pay | Admitting: General Practice

## 2020-04-19 NOTE — Telephone Encounter (Signed)
..  Type of form received: Physical Therapy Re-certification  Additional comments:   Received by:  Tye Savoy should be Faxed to:  [phone] Form should be mailed to:    Is patient requesting call for pickup:   Form placed:    Attach charge sheet.  Provider will determine charge.  Individual made aware of 3-5 business day turn around (Y/N)?

## 2020-04-19 NOTE — Telephone Encounter (Signed)
Placed form in bin to be filled.

## 2020-04-20 NOTE — Telephone Encounter (Signed)
Forms faxed to the # on the form and sent a copy to scan

## 2020-04-20 NOTE — Telephone Encounter (Signed)
Form completed and placed in basket  

## 2020-04-22 ENCOUNTER — Ambulatory Visit: Payer: Medicare HMO | Admitting: Podiatry

## 2020-04-26 ENCOUNTER — Ambulatory Visit (INDEPENDENT_AMBULATORY_CARE_PROVIDER_SITE_OTHER): Payer: Medicare HMO | Admitting: Podiatry

## 2020-04-26 ENCOUNTER — Ambulatory Visit (INDEPENDENT_AMBULATORY_CARE_PROVIDER_SITE_OTHER): Payer: Medicare HMO

## 2020-04-26 ENCOUNTER — Other Ambulatory Visit: Payer: Self-pay

## 2020-04-26 ENCOUNTER — Encounter: Payer: Self-pay | Admitting: Podiatry

## 2020-04-26 ENCOUNTER — Telehealth: Payer: Self-pay

## 2020-04-26 DIAGNOSIS — M779 Enthesopathy, unspecified: Secondary | ICD-10-CM | POA: Diagnosis not present

## 2020-04-26 DIAGNOSIS — M79674 Pain in right toe(s): Secondary | ICD-10-CM | POA: Diagnosis not present

## 2020-04-26 MED ORDER — TRIAMCINOLONE ACETONIDE 10 MG/ML IJ SUSP
10.0000 mg | Freq: Once | INTRAMUSCULAR | Status: AC
  Administered 2020-04-26: 10 mg

## 2020-04-26 NOTE — Telephone Encounter (Signed)
Rx was filled on 03/12/20. Called to inform patient but could not get through. Rx was sent for a 90 day supply. Patient should have more than enough until her appt with new provider.

## 2020-04-26 NOTE — Telephone Encounter (Signed)
  LAST APPOINTMENT DATE: 02/10/2020   NEXT APPOINTMENT DATE:@Visit  date not found  MEDICATION:pantoprazole (PROTONIX) 40 MG tablet  PHARMACY: [place] [place]  Comments: Patient has a TOC from [place] to [facility] scheduled for 3/30

## 2020-04-27 ENCOUNTER — Ambulatory Visit (HOSPITAL_COMMUNITY): Payer: Medicare HMO

## 2020-04-28 ENCOUNTER — Ambulatory Visit (HOSPITAL_COMMUNITY): Payer: Medicare HMO

## 2020-04-28 ENCOUNTER — Other Ambulatory Visit: Payer: Self-pay | Admitting: Family Medicine

## 2020-04-28 DIAGNOSIS — K219 Gastro-esophageal reflux disease without esophagitis: Secondary | ICD-10-CM

## 2020-04-30 ENCOUNTER — Telehealth: Payer: Self-pay | Admitting: Podiatry

## 2020-04-30 NOTE — Telephone Encounter (Signed)
Pt called stating her foot is still hurting after injection. She'd like to know if this is normal. Please advise.

## 2020-04-30 NOTE — Progress Notes (Signed)
Subjective:   Patient ID: [name] [name], female   DOB: 80 y.o.   MRN: [id number]   HPI Patient presents stating she has had a lot of pain in her right foot and states that the joint has been sore and making it hard to walk and she does not remember specific injury.  Patient does not smoke and would like to be more active   Review of Systems  All other systems reviewed and are negative.       Objective:  Physical Exam Vitals and nursing note reviewed.  Constitutional:      Appearance: She is well-developed.  Pulmonary:     Effort: Pulmonary effort is normal.  Musculoskeletal:        General: Normal range of motion.  Skin:    General: Skin is warm.  Neurological:     Mental Status: She is alert.     Neurovascular status intact muscle strength was found to be adequate range of motion adequate.  Patient is found to have exquisite discomfort in the second metatarsal phalangeal joint right with inflammation fluid of the joint surface with moderate swelling of the surface no current changes of the position of the second digit     Assessment:  Inflammatory capsulitis second MPJ right     Plan:  H&P condition reviewed and education concerning done along with x-ray.  I went ahead today and I did do a anesthesia block of the right forefoot 60 mg like Marcaine mixture I then did sterile prep of the joint and I aspirated getting out a small amount of clear fluid injected quarter cc dexamethasone Kenalog and applied thick padding to reduce pressure on the joint surface.  Reappoint next several weeks to recheck  X-rays indicate that there is no signs of fracture no signs of other pathology except for slight stressing of the joint surface

## 2020-05-03 ENCOUNTER — Ambulatory Visit (INDEPENDENT_AMBULATORY_CARE_PROVIDER_SITE_OTHER): Payer: Medicare HMO

## 2020-05-03 DIAGNOSIS — R55 Syncope and collapse: Secondary | ICD-10-CM

## 2020-05-04 LAB — CUP PACEART REMOTE DEVICE CHECK
Date Time Interrogation Session: 20220328230804
Implantable Pulse Generator Implant Date: 20191002

## 2020-05-05 ENCOUNTER — Telehealth (HOSPITAL_COMMUNITY): Payer: Self-pay | Admitting: *Deleted

## 2020-05-05 ENCOUNTER — Encounter: Payer: Medicare HMO | Admitting: Family Medicine

## 2020-05-05 NOTE — Telephone Encounter (Signed)
Patient given detailed instructions per Myocardial Perfusion Study Information Sheet for the test on 05/10/20. Patient notified to arrive 15 minutes early and that it is imperative to arrive on time for appointment to keep from having the test rescheduled.  If you need to cancel or reschedule your appointment, please call the office within 24 hours of your appointment. . Patient verbalized understanding. Kirstie Peri

## 2020-05-10 ENCOUNTER — Ambulatory Visit (HOSPITAL_COMMUNITY): Payer: Medicare HMO

## 2020-05-11 ENCOUNTER — Ambulatory Visit (HOSPITAL_COMMUNITY): Payer: Medicare HMO

## 2020-05-12 NOTE — Progress Notes (Signed)
Carelink Summary Report / Loop Recorder 

## 2020-05-17 ENCOUNTER — Telehealth: Payer: Self-pay | Admitting: Podiatry

## 2020-05-17 NOTE — Telephone Encounter (Signed)
yes

## 2020-05-17 NOTE — Telephone Encounter (Signed)
Patient called and stated that she was seen on 04/26/2020 and she is having problems with her right 2nd toe. It has turned black and blue. She wanted to know if that was normal after getting the injection?

## 2020-05-18 NOTE — Telephone Encounter (Signed)
Patient stated that she is having pain in that toe as well. I did offer her to come in for an appointment but she wanted to know was the pain a side affect as well.

## 2020-05-18 NOTE — Telephone Encounter (Signed)
Most likely

## 2020-05-20 ENCOUNTER — Telehealth (HOSPITAL_COMMUNITY): Payer: Self-pay | Admitting: *Deleted

## 2020-05-20 NOTE — Telephone Encounter (Signed)
Left message on voicemail per DPR in reference to upcoming appointment scheduled on 05/24/20 at 1:00 with detailed instructions given per Myocardial Perfusion Study Information Sheet for the test. LM to arrive 15 minutes early, and that it is imperative to arrive on time for appointment to keep from having the test rescheduled. If you need to cancel or reschedule your appointment, please call the office within 24 hours of your appointment. Failure to do so may result in a cancellation of your appointment, and a $50 no show fee. Phone number given for call back for any questions.

## 2020-05-24 ENCOUNTER — Ambulatory Visit (HOSPITAL_COMMUNITY): Payer: Medicare HMO

## 2020-05-25 ENCOUNTER — Other Ambulatory Visit: Payer: Self-pay

## 2020-05-25 ENCOUNTER — Ambulatory Visit (HOSPITAL_COMMUNITY): Payer: Medicare HMO

## 2020-05-25 ENCOUNTER — Ambulatory Visit (HOSPITAL_COMMUNITY): Payer: Medicare HMO | Attending: Internal Medicine

## 2020-05-25 DIAGNOSIS — R0602 Shortness of breath: Secondary | ICD-10-CM | POA: Insufficient documentation

## 2020-05-25 DIAGNOSIS — R079 Chest pain, unspecified: Secondary | ICD-10-CM | POA: Insufficient documentation

## 2020-05-25 MED ORDER — TECHNETIUM TC 99M TETROFOSMIN IV KIT
31.1000 | PACK | Freq: Once | INTRAVENOUS | Status: AC | PRN
  Administered 2020-05-25: 31.1 via INTRAVENOUS
  Filled 2020-05-25: qty 32

## 2020-05-25 MED ORDER — REGADENOSON 0.4 MG/5ML IV SOLN
0.4000 mg | Freq: Once | INTRAVENOUS | Status: AC
Start: 2020-05-25 — End: 2020-05-25
  Administered 2020-05-25: 0.4 mg via INTRAVENOUS

## 2020-05-26 ENCOUNTER — Telehealth: Payer: Self-pay

## 2020-05-26 ENCOUNTER — Ambulatory Visit (HOSPITAL_COMMUNITY): Payer: Medicare HMO | Attending: Cardiovascular Disease

## 2020-05-26 LAB — MYOCARDIAL PERFUSION IMAGING
LV dias vol: 97 mL (ref 46–106)
LV sys vol: 38 mL
Peak HR: 93 {beats}/min
Rest HR: 74 {beats}/min
SDS: 7
SRS: 0
SSS: 7
TID: 0.98

## 2020-05-26 MED ORDER — TECHNETIUM TC 99M TETROFOSMIN IV KIT
30.2000 | PACK | Freq: Once | INTRAVENOUS | Status: AC | PRN
  Administered 2020-05-26: 30.2 via INTRAVENOUS
  Filled 2020-05-26: qty 31

## 2020-05-26 NOTE — Telephone Encounter (Signed)
Request from [facility] for an Antihistamine for itching was faxed to the office attn Hassell Done seen by Hassell Done 02/10/2020 pt has previously taken Zyrtec

## 2020-05-26 NOTE — Telephone Encounter (Signed)
Im sorry, I was unclear, pt has been taking zyrtec daily and [facility] is requesting other medication for pt to take as this is not controlling the sxs.

## 2020-06-03 ENCOUNTER — Encounter (HOSPITAL_BASED_OUTPATIENT_CLINIC_OR_DEPARTMENT_OTHER): Payer: Self-pay | Admitting: Nurse Practitioner

## 2020-06-03 ENCOUNTER — Ambulatory Visit (INDEPENDENT_AMBULATORY_CARE_PROVIDER_SITE_OTHER): Payer: Medicare HMO

## 2020-06-03 ENCOUNTER — Ambulatory Visit (INDEPENDENT_AMBULATORY_CARE_PROVIDER_SITE_OTHER): Payer: Medicare HMO | Admitting: Nurse Practitioner

## 2020-06-03 ENCOUNTER — Other Ambulatory Visit: Payer: Self-pay

## 2020-06-03 VITALS — BP 121/77 | HR 97 | Ht 64.0 in | Wt 252.8 lb

## 2020-06-03 DIAGNOSIS — I10 Essential (primary) hypertension: Secondary | ICD-10-CM

## 2020-06-03 DIAGNOSIS — Z7689 Persons encountering health services in other specified circumstances: Secondary | ICD-10-CM | POA: Diagnosis not present

## 2020-06-03 DIAGNOSIS — Z6841 Body Mass Index (BMI) 40.0 and over, adult: Secondary | ICD-10-CM | POA: Diagnosis not present

## 2020-06-03 DIAGNOSIS — Z79899 Other long term (current) drug therapy: Secondary | ICD-10-CM | POA: Diagnosis not present

## 2020-06-03 DIAGNOSIS — R55 Syncope and collapse: Secondary | ICD-10-CM

## 2020-06-03 HISTORY — DX: Persons encountering health services in other specified circumstances: Z76.89

## 2020-06-03 MED ORDER — HYDROCHLOROTHIAZIDE 25 MG PO TABS: 12.5000 mg | ORAL_TABLET | Freq: Every day | ORAL | 1 refills | Status: DC

## 2020-06-03 NOTE — Progress Notes (Signed)
New Patient Office Visit  Subjective:  Patient ID: [name] [name], female    DOB: 22-Nov-1940  Age: 80 y.o. MRN: [id number]  CC:  Chief Complaint  Patient presents with  . Establish Care    HPI [name] [name] is a very pleasant 80 year old female presenting today to establish care. She has moved to the area from [place] to be closer to her son after her husband passed away last year. She has been in the area for about 7 months at this time. She is currently residing at [facility].  She has three adult sons, one who lives nearby in [place] and she enjoys spending time with him every Saturday. She has two other sons who live out of state, but she does speak with them on a regular basis and she has plans to celebrate her 80th [employer] Anda this fall with all of her children and their families. Her best friend lives in [place] and she has plans to go visit with her this summer. She speaks to her on the phone weekly.  She reports that she is adjusting well to the move, although it was difficult at first. She tells me that her husband passed away and she moved here within a months time, which was hard for her. She is enjoying the assisted living facility and has her own 2 bedroom apartment. She has started participating in activities and spends her afternoons in the common area with other residents. She has started making friends and enjoys their company.  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   She would like to discuss her current medications and learn what they are for today. She would also like to see if there are any medications that can be decreased or taken away.   She previously saw Elyn Aquas with [facility]. She received cardiac care from Dr. Alda Lea and she sees [facility] for her skin.   Past Medical History:  Diagnosis Date  . Allergy   . Anxiety   . Arthritis   . Blood transfusion without reported diagnosis   . Cancer ([place])     Melanoma on Great toe  . Cataract   . COPD (chronic obstructive pulmonary disease) ([place])   . Depression   . GERD (gastroesophageal reflux disease)   . Hyperlipidemia   . Hypertension   . Osteoporosis   . Sleep apnea    ROS Review of Systems All review of systems negative except what is listed in the HPI  Objective:   Today's Vitals: BP 121/77   Pulse 97   Ht 5\' 4"  (1.626 m)   Wt 252 lb 12.8 oz (114.7 kg)   SpO2 99%   BMI 43.39 kg/m   Physical Exam Vitals and nursing note reviewed.  Constitutional:      Appearance: Normal appearance.  HENT:     Head: Normocephalic and atraumatic.  Eyes:     Extraocular Movements: Extraocular movements intact.     Conjunctiva/sclera: Conjunctivae normal.     Pupils: Pupils are equal, round, and reactive to light.  Neck:     Vascular: No carotid bruit.  Cardiovascular:     Rate and Rhythm: Normal rate and regular rhythm.     Pulses: Normal pulses.     Heart sounds: Normal heart sounds.  Pulmonary:     Effort: Pulmonary effort is normal.     Breath sounds: Normal breath sounds.  Abdominal:     General: Bowel sounds are  normal.     Palpations: Abdomen is soft.  Musculoskeletal:        General: Normal range of motion.     Cervical back: Normal range of motion.     Right lower leg: No edema.     Left lower leg: No edema.  Skin:    General: Skin is warm and dry.     Capillary Refill: Capillary refill takes less than 2 seconds.  Neurological:     General: No focal deficit present.     Mental Status: She is alert and oriented to person, place, and time.     Comments: Utilizes rolling walker for mobility.   Psychiatric:        Mood and Affect: Mood normal.        Behavior: Behavior normal.        Thought Content: Thought content normal.        Judgment: Judgment normal.     Assessment & Plan:   Problem List Items Addressed This Visit      Cardiovascular and Mediastinum   Hypertension - Primary      Outpatient  Encounter Medications as of 06/03/2020  Medication Sig  . albuterol (VENTOLIN HFA) 108 (90 Base) MCG/ACT inhaler Inhale 1-2 puffs into the lungs every 6 (six) hours as needed for wheezing or shortness of breath.  Marland Kitchen amLODipine (NORVASC) 5 MG tablet Take 5 mg by mouth daily.  Marland Kitchen aspirin EC 81 MG tablet Take 81 mg by mouth daily. Swallow whole.  Marland Kitchen atorvastatin (LIPITOR) 10 MG tablet Take 10 mg by mouth daily.  . calcium-vitamin D (OSCAL WITH D) 500-200 MG-UNIT tablet Take 1 tablet by mouth.  . camphor-menthol (SARNA) lotion Apply 1 application topically as needed for itching.  . cetirizine (ZYRTEC) 10 MG tablet Take 10 mg by mouth daily.  . clonazePAM (KLONOPIN) 0.5 MG tablet Take 0.5 mg tablet by mouth as needed  . EPINEPHrine 0.3 mg/0.3 mL IJ SOAJ injection SMARTSIG:0.3 Milligram(s) IM Once PRN  . famotidine (PEPCID) 40 MG tablet Take 40 mg by mouth daily.  . hydrALAZINE (APRESOLINE) 25 MG tablet Take 25 mg by mouth 2 (two) times daily.  . hydrochlorothiazide (HYDRODIURIL) 25 MG tablet Take 25 mg by mouth daily.  Marland Kitchen losartan (COZAAR) 100 MG tablet Take 100 mg by mouth daily.  . pantoprazole (PROTONIX) 40 MG tablet TAKE ONE TABLET BY MOUTH EVERY MORNING FOR GERD  . PARoxetine (PAXIL) 40 MG tablet Take 40 mg by mouth daily.  . [DISCONTINUED] ergocalciferol (VITAMIN D2) 1.25 MG (50000 UT) capsule Take 50,000 Units by mouth once a week.  . [DISCONTINUED] saccharomyces boulardii (FLORASTOR) 250 MG capsule Take 1 capsule (250 mg total) by mouth 2 (two) times daily.   No facility-administered encounter medications on file as of 06/03/2020.   Time: 70 minutes, >50% spent counseling, care coordination, chart review, and documentation.    Follow-up: No follow-ups on file.   Orma Render, NP

## 2020-06-03 NOTE — Patient Instructions (Addendum)
Before you leave today Judson Roch will schedule your Medicare Wellness Visit in about a month.   I have decreased your hydrochlorothiazide to 1/2 tab (12.5 mg) a day.   ______________________________________________ Thank you for choosing [facility] for your Primary Care needs. I am excited for the opportunity to partner with you to meet your health care goals. It was a pleasure meeting you today!  I am an Adult-Geriatric Nurse Practitioner with a background in caring for patients for more than 20 years. I received my Paediatric nurse in Nursing and my Doctor of Nursing Practice degrees at [employer]. I received additional fellowship training in primary care and sports medicine after receiving my doctorate degree. I provide primary care and sports medicine services to patients age 80 and older within this office. I am also a provider with the [facility] and the director of the APP Fellowship with [facility].  I am a [place] native, but have called the [place] area home for nearly 20 years and am proud to be a member of this community.   I am passionate about providing the best service to you through preventive medicine and supportive care. I consider you a part of the medical team and value your input. I work diligently to ensure that you are heard and your needs are met in a safe and effective manner. I want you to feel comfortable with me as your provider and want you to know that your health concerns are important to me.   For your information, our office hours are Monday- Friday 8:00 AM - 5:00 PM At this time I am not in the office on Wednesdays.  If you have questions or concerns, please call our office at [phone] or send [place] a MyChart message and we will respond as quickly as possible.   For all urgent or time sensitive needs we ask that you please call the office to avoid delays. MyChart is not constantly  monitored and replies may take up to 72 business hours.  MyChart Policy: . MyChart allows for you to see your visit notes, after visit summary, provider recommendations, lab and tests results, make an appointment, request refills, and contact your provider or the office for non-urgent questions or concerns.  . Providers are seeing patients during normal business hours and do not have built in time to review MyChart messages. We ask that you allow a minimum of 72 business hours for MyChart message responses.  . Complex MyChart concerns may require a visit. Your provider may request you schedule a virtual or in person visit to ensure we are providing the best care possible. . MyChart messages sent after 4:00 PM on Friday will not be received by the provider until Monday morning.    Lab and Test Results: . You will receive your lab and test results on MyChart as soon as they are completed and results have been sent by the lab or testing facility. Due to this service, you will receive your results BEFORE your provider.  . Please allow a minimum of 72 business hours for your provider to receive and review lab and test results and contact you about.   . Most lab and test result comments from the provider will be sent through [place]. Your provider may recommend changes to the plan of care, follow-up visits, repeat testing, ask questions, or request an office visit to discuss these results. You may reply directly to this message  or call the office at [phone] to provide information for the provider or set up an appointment. . In some instances, you will be called with test results and recommendations. Please let us know if this is preferred and we will make note of this in your chart to provide this for you.    . If you have not heard a response to your lab or test results in 72 business hours, please call the office to let us know.   After Hours: . For all non-emergency after hours needs, please call  the office at [phone] and select the option to reach the on-call provider service. On-call services are shared between multiple [facility] offices and therefore it will not be possible to speak directly with your provider. On-call providers may provide medical advice and recommendations, but are unable to provide refills for maintenance medications.  . For all emergency or urgent medical needs after normal business hours, we recommend that you seek care at the closest Urgent Care or Emergency Department to ensure appropriate treatment in a timely manner.  [facility] has a 24 hour emergency room located on the ground floor for your convenience.    Please do not hesitate to reach out to [place] with concerns.   Thank you, again, for choosing me as your health care partner. I appreciate your trust and look forward to learning more about you.   Worthy Keeler, DNP, AGNP-c _______________________________________________________

## 2020-06-03 NOTE — Assessment & Plan Note (Signed)
Complete medication review performed based on paperwork provided from ALF with daily and PRN medications listed.  Discussion of the purpose of each medication completed as well as paperwork describing the medication, dosage, and use provided.  Discussed options for medication taper at patients wish to reduce medications that she takes on a daily basis.  Trial to decrease HCTZ discussed and a plan set forth with the patient.  Will re-evaluate plan at upcoming [place].

## 2020-06-03 NOTE — Assessment & Plan Note (Signed)
Blood pressure very well controlled today at 121/77. She would like to taper off of some of the medication she is taking. We did discuss today the option to try to taper off of one of her blood pressure medications. Given her age and history of falls, a blood pressure threshold of less than 140/90 is reasonable for her. We will begin taper off of hydrochlorothiazide from 25mg  to 12.5mg  per day. Patient is agreeable to this plan. Paperwork for her facility signed with dosage change printed. Will plan to follow-up with Medicare Wellness Visit in next few weeks to recheck blood pressure and re-evaluate the plan.

## 2020-06-03 NOTE — Assessment & Plan Note (Signed)
New patient to establish care today. Will work to obtain and review previous medical records to gain a better understanding of the patients health history.  Review of past and current medical history with patient today and current social history.  Patient is due for Medicare Wellness Exam. Will follow-up with this in the near future.  A full medication review performed today with paperwork from the ALF where she resides.

## 2020-06-07 LAB — CUP PACEART REMOTE DEVICE CHECK
Date Time Interrogation Session: 20220430232610
Implantable Pulse Generator Implant Date: 20191002

## 2020-06-16 ENCOUNTER — Other Ambulatory Visit (INDEPENDENT_AMBULATORY_CARE_PROVIDER_SITE_OTHER): Payer: Medicare HMO | Admitting: Nurse Practitioner

## 2020-06-16 DIAGNOSIS — R8290 Unspecified abnormal findings in urine: Secondary | ICD-10-CM

## 2020-06-16 DIAGNOSIS — R350 Frequency of micturition: Secondary | ICD-10-CM

## 2020-06-16 DIAGNOSIS — R300 Dysuria: Secondary | ICD-10-CM

## 2020-06-16 NOTE — Progress Notes (Signed)
Urine sensitivity and culture test requested from [facility] for patient due to dysuria, malodorous urine, and frequent urination. Signed order faxed to facility for UA and culture/sensitivity testing to be performed. Will await results.

## 2020-06-18 ENCOUNTER — Other Ambulatory Visit (INDEPENDENT_AMBULATORY_CARE_PROVIDER_SITE_OTHER): Payer: Medicare HMO | Admitting: Nurse Practitioner

## 2020-06-18 DIAGNOSIS — Z9189 Other specified personal risk factors, not elsewhere classified: Secondary | ICD-10-CM

## 2020-06-18 DIAGNOSIS — N3090 Cystitis, unspecified without hematuria: Secondary | ICD-10-CM

## 2020-06-18 MED ORDER — DIPHENHYDRAMINE HCL 25 MG PO CAPS: 25.0000 mg | ORAL_CAPSULE | Freq: Four times a day (QID) | ORAL | 1 refills | Status: DC | PRN

## 2020-06-18 MED ORDER — FAMOTIDINE 40 MG PO TABS: 40.0000 mg | ORAL_TABLET | Freq: Two times a day (BID) | ORAL | 1 refills | Status: DC | PRN

## 2020-06-18 MED ORDER — CIPROFLOXACIN HCL 250 MG PO TABS: 250.0000 mg | ORAL_TABLET | Freq: Two times a day (BID) | ORAL | 0 refills | Status: DC

## 2020-06-18 NOTE — Progress Notes (Signed)
Results from [facility] received for UA: Bacteria and WBC present in UA- culture pending.  Medication review reveals patient allergic to penicillins, nitrofurantoin, and sulfa antibiotics with anaphylactic reactions- will avoid cephalosporins due to significant reaction with PCN.  Listed reaction with levofloxacin as rash with no indication of anaphylaxis.  Given significant allergies, will trial 3 day course of ciprofloxacin 250mg  BID.  INSTRUCTIONS FOR NURSING FACILITY: Monitor for rash, hives, shortness of breath, swelling of face, tongue, lips, or throat, chest pain, dizziness, or other adverse reaction.  If reaction mild, patient may be given famotidine 40mg  and diphendydramine 25mg , monitored closely and provider contacted. PRN script sent to pharmacy on file with instructions.   Seek emergency medical attention if signs of anaphylaxis (severe shortness of breath, swelling of the face, tongue, lips, throat, eyes), shortness of breath, wheezing, dizziness, nausea, vomiting, or lethargy occur or if symptoms not resolved with recommended reaction medications.   Orma Render, DNP, AGNP-c

## 2020-06-23 NOTE — Progress Notes (Signed)
Carelink Summary Report / Loop Recorder 

## 2020-06-28 ENCOUNTER — Ambulatory Visit (INDEPENDENT_AMBULATORY_CARE_PROVIDER_SITE_OTHER): Payer: Medicare HMO | Admitting: Podiatry

## 2020-06-28 ENCOUNTER — Other Ambulatory Visit: Payer: Self-pay

## 2020-06-28 DIAGNOSIS — G629 Polyneuropathy, unspecified: Secondary | ICD-10-CM | POA: Diagnosis not present

## 2020-06-28 DIAGNOSIS — M779 Enthesopathy, unspecified: Secondary | ICD-10-CM

## 2020-06-28 MED ORDER — TRIAMCINOLONE ACETONIDE 10 MG/ML IJ SUSP
10.0000 mg | Freq: Once | INTRAMUSCULAR | Status: AC
  Administered 2020-06-28: 10 mg

## 2020-06-28 MED ORDER — GABAPENTIN 300 MG PO CAPS: 300.0000 mg | ORAL_CAPSULE | Freq: Three times a day (TID) | ORAL | 3 refills | Status: DC

## 2020-06-28 NOTE — Progress Notes (Signed)
Subjective:   Patient ID: [name] [name], female   DOB: 80 y.o.   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  Educated her on risk of this and she may not be able to tolerate it but hopefully she will and will be seen back to recheck

## 2020-07-01 ENCOUNTER — Other Ambulatory Visit: Payer: Self-pay

## 2020-07-01 ENCOUNTER — Encounter (HOSPITAL_BASED_OUTPATIENT_CLINIC_OR_DEPARTMENT_OTHER): Payer: Self-pay | Admitting: Nurse Practitioner

## 2020-07-01 ENCOUNTER — Telehealth (INDEPENDENT_AMBULATORY_CARE_PROVIDER_SITE_OTHER): Payer: Medicare HMO | Admitting: Nurse Practitioner

## 2020-07-01 VITALS — BP 134/49 | HR 103

## 2020-07-01 DIAGNOSIS — Z23 Encounter for immunization: Secondary | ICD-10-CM

## 2020-07-01 DIAGNOSIS — G629 Polyneuropathy, unspecified: Secondary | ICD-10-CM | POA: Diagnosis not present

## 2020-07-01 DIAGNOSIS — Z0000 Encounter for general adult medical examination without abnormal findings: Secondary | ICD-10-CM

## 2020-07-01 HISTORY — DX: Encounter for immunization: Z23

## 2020-07-01 HISTORY — DX: Encounter for general adult medical examination without abnormal findings: Z00.00

## 2020-07-01 NOTE — Patient Instructions (Signed)
[name] [name] , Thank you for taking time to come for your Medicare Wellness Visit. I appreciate your ongoing commitment to your health goals. Please review the following plan we discussed and let me know if I can assist you in the future.   These are the goals we discussed: Goals    .  Patient Stated      Increase activity    .  Patient Stated (pt-stated)      "Walk farther without shortness of breath and other problems"       This is a list of the screening recommended for you and due dates:  Health Maintenance  Topic Date Due  . Hepatitis C Screening: USPSTF Recommendation to screen - Ages 10-79 yo.  Never done  . Zoster (Shingles) Vaccine (1 of 2) Will check to see if there are contraindications for this.   Marland Kitchen DEXA scan (bone density measurement)  Reported about one year ago  . COVID-19 Vaccine (3 - Moderna risk 4-dose series) 01/15/2020  . Tetanus Vaccine  12/29/2020*  . Flu Shot  09/06/2020  . Pneumonia vaccines  Completed      *Topic was postponed. The date shown is not the original due date.    Neuropathic Pain Neuropathic pain is pain caused by damage to the nerves that are responsible for certain sensations in your body (sensory nerves). The pain can be caused by:  Damage to the sensory nerves that send signals to your spinal cord and brain (peripheral nervous system).  Damage to the sensory nerves in your brain or spinal cord (central nervous system). Neuropathic pain can make you more sensitive to pain. Even a minor sensation can feel very painful. This is usually a long-term condition that can be difficult to treat. The type of pain differs from person to person. It may:  Start suddenly (acute), or it may develop slowly and last for a long time (chronic).  Come and go as damaged nerves heal, or it may stay at the same level for years.  Cause emotional distress, loss of sleep, and a lower quality of life. What are the causes? The most common cause of this  condition is diabetes. Many other diseases and conditions can also cause neuropathic pain. Causes of neuropathic pain can be classified as:  Toxic. This is caused by medicines and chemicals. The most common cause of toxic neuropathic pain is damage from cancer treatments (chemotherapy).  Metabolic. This can be caused by: ? Diabetes. This is the most common disease that damages the nerves. ? Lack of vitamin B from long-term alcohol abuse.  Traumatic. Any injury that cuts, crushes, or stretches a nerve can cause damage and pain. A common example is feeling pain after losing an arm or leg (phantom limb pain).  Compression-related. If a sensory nerve gets trapped or compressed for a long period of time, the blood supply to the nerve can be cut off.  Vascular. Many blood vessel diseases can cause neuropathic pain by decreasing blood supply and oxygen to nerves.  Autoimmune. This type of pain results from diseases in which the body's defense system (immune system) mistakenly attacks sensory nerves. Examples of autoimmune diseases that can cause neuropathic pain include lupus and multiple sclerosis.  Infectious. Many types of viral infections can damage sensory nerves and cause pain. Shingles infection is a common cause of this type of pain.  Inherited. Neuropathic pain can be a symptom of many diseases that are passed down through families (genetic). What increases the risk?  You are more likely to develop this condition if:  You have diabetes.  You smoke.  You drink too much alcohol.  You are taking certain medicines, including medicines that kill cancer cells (chemotherapy) or that treat immune system disorders. What are the signs or symptoms? The main symptom is pain. Neuropathic pain is often described as:  Burning.  Shock-like.  Stinging.  Hot or cold.  Itching. How is this diagnosed? No single test can diagnose neuropathic pain. It is diagnosed based on:  Physical exam and  your symptoms. Your health care provider will ask you about your pain. You may be asked to use a pain scale to describe how bad your pain is.  Tests. These may be done to see if you have a high sensitivity to pain and to help find the cause and location of any sensory nerve damage. They include: ? Nerve conduction studies to test how well nerve signals travel through your sensory nerves (electrodiagnostic testing). ? Stimulating your sensory nerves through electrodes on your skin and measuring the response in your spinal cord and brain (somatosensory evoked potential).  Imaging studies, such as: ? X-rays. ? CT scan. ? MRI. How is this treated? Treatment for neuropathic pain may change over time. You may need to try different treatment options or a combination of treatments. Some options include:  Treating the underlying cause of the neuropathy, such as diabetes, kidney disease, or vitamin deficiencies.  Stopping medicines that can cause neuropathy, such as chemotherapy.  Medicine to relieve pain. Medicines may include: ? Prescription or over-the-counter pain medicine. ? Anti-seizure medicine. ? Antidepressant medicines. ? Pain-relieving patches that are applied to painful areas of skin. ? A medicine to numb the area (local anesthetic), which can be injected as a nerve block.  Transcutaneous nerve stimulation. This uses electrical currents to block painful nerve signals. The treatment is painless.  Alternative treatments, such as: ? Acupuncture. ? Meditation. ? Massage. ? Physical therapy. ? Pain management programs. ? Counseling. Follow these instructions at home: Medicines  Take over-the-counter and prescription medicines only as told by your health care provider.  Do not drive or use heavy machinery while taking prescription pain medicine.  If you are taking prescription pain medicine, take actions to prevent or treat constipation. Your health care provider may recommend  that you: ? Drink enough fluid to keep your urine pale yellow. ? Eat foods that are high in fiber, such as fresh fruits and vegetables, whole grains, and beans. ? Limit foods that are high in fat and processed sugars, such as fried or sweet foods. ? Take an over-the-counter or prescription medicine for constipation.   Lifestyle  Have a good support system at home.  Consider joining a chronic pain support group.  Do not use any products that contain nicotine or tobacco, such as cigarettes and e-cigarettes. If you need help quitting, ask your health care provider.  Do not drink alcohol.   General instructions  Learn as much as you can about your condition.  Work closely with all your health care providers to find the treatment plan that works best for you.  Ask your health care provider what activities are safe for you.  Keep all follow-up visits as told by your health care provider. This is important. Contact a health care provider if:  Your pain treatments are not working.  You are having side effects from your medicines.  You are struggling with tiredness (fatigue), mood changes, depression, or anxiety. Summary  Neuropathic pain  is pain caused by damage to the nerves that are responsible for certain sensations in your body (sensory nerves).  Neuropathic pain may come and go as damaged nerves heal, or it may stay at the same level for years.  Neuropathic pain is usually a long-term condition that can be difficult to treat. Consider joining a chronic pain support group. This information is not intended to replace advice given to you by your health care provider. Make sure you discuss any questions you have with your health care provider. Document Revised: 05/16/2018 Document Reviewed: 02/09/2017 Elsevier Patient Education  2021 [place] and Fractures  Falls can be very serious, especially for older adults or people with osteoporosis  Falls can be  caused by:  Tripping or slipping  Slow reflexes  Balance problems  Reduced muscle strength  Poor vision or a recent change in prescription  Illness and some medications (especially blood pressure pills, diuretics, heart medicines, muscle relaxants and sleep medications)  Drinking alcohol  To prevent falls outdoors:  Use a can or walker if needed  Wear rubber-soled shoes so you don't slip  DO NOT buy "shape up" shoes with rocker bottom soles if you have balance problems.  The thick soles and shape make it more difficult to keep your balance.  Put kitty litter or salt on icy sidewalks  Walk on the grass if the sidewalks are slick  Avoid walking on uneven ground whenever possible  T prevent falls indoors:  Keep rooms clutter-free, especially hallways, stairs and paths to light switches  Remove throw rugs  Install night lights, especially to and in the bathroom  Turn on lights before going downstairs  Keep a flashlight next to your bed  Buy a cordless phone to keep with you instead of jumping up to answer the phone  Install grab bars in the bathroom near the shower and toilet  Install rails on both sides of the stairs.  Make sure the stairs are well lit  Wear slippers with non-skid soles.  Do not walk around in stockings or socks  Balance problems and dizziness are not a normal part of growing older.  If you begin having balance problems or dizziness see your doctor.  Physical Therapy can help you with many balance problems, strengthening hip and leg muscles and with gait training.  To keep your bones healthy make sure you are getting enough calcium and Vitamin D each day.  Ask your doctor or pharmacist about supplements.  Regular weight-bearing exercise like walking, lifting weights or dancing can help strengthen bones and prevent osteoporosis.

## 2020-07-01 NOTE — Progress Notes (Signed)
[facility] Joylynn Defrancesco, DNP, AGNP-c  [place] VISIT  07/01/2020  Telephone Visit Disclaimer This Medicare AWV was conducted by telephone due to national recommendations for restrictions regarding the COVID-19 Pandemic (e.g. social distancing).  I verified, using two identifiers, that I am speaking with [name] [name] or their authorized healthcare agent. I discussed the limitations, risks, security, and privacy concerns of performing an evaluation and management service by telephone and the potential availability of an in-person appointment in the future. The patient expressed understanding and agreed to proceed.  Location of Patient: In her home Location of Provider (nurse):  In the office  Subjective:    [name] [name] is a 80 y.o. female patient of [name] [name], Coralee Pesa, NP who had a Medicare Annual Wellness Visit today via telephone. [name] [name] is Retired and lives in an assisted living facility. she has 3 children. she reports that she is socially active and does interact with friends/family regularly. she is moderately physically active and enjoys bocce ball.  [name] [name] has recently moved to the [place] area to be closer to her son after the passing of her husband late last year. She has established care with this practice and has transitioned her specialty care to local providers. She is currently residing in [place] in an independent apartment. She has her meals prepared by the staff at [facility] and her medications are managed by staff. She participates in activities in the facility daily and endorses that she is enjoying her time there now that she is settled, although the initial transition was difficult. She sees her son weekly and is planning to attend a family get together for the Memorial Day weekend with her son, grandchildren, and great-grandchildren.    Patient Care Team: Canesha Tesfaye, Coralee Pesa, NP as PCP - General (Nurse Practitioner)  Advanced Directives 07/01/2020 03/16/2020 02/02/2020  Does Patient Have a Medical Advance Directive? Yes Yes Yes  Type of Paramedic of [place];Living will;Out of facility DNR (pink MOST or yellow form) [place];Living will Living will;Healthcare Power of [place] in Chart? No - copy requested No - copy requested No - copy requested    Hospital Utilization Over the Past 12 Months: # of hospitalizations or ER visits: 0 # of surgeries: 0  Review of Systems    Patient reports that her overall health is unchanged compared to last year.  Negative except foot pain  Patient Reported Readings (BP, Pulse, CBG, Weight, etc) Vitals:   07/01/20 1323  BP: (!) 134/49  Pulse: (!) 103   Pain Assessment Pain : No/denies pain    Current Medications & Allergies (verified) Allergies as of 07/01/2020      Reactions   Azithromycin Anaphylaxis, Hives   Bee Venom Anaphylaxis   Erythromycin Base Anaphylaxis   Metronidazole Anaphylaxis   Nitrofurantoin Macrocrystal Anaphylaxis   Penicillins Anaphylaxis, Hives   childhood   Sulfa Antibiotics Anaphylaxis, Hives   Childhood   Cymbalta [duloxetine Hcl]    Ivp Dye [iodinated Diagnostic Agents]    Penicillin G Hives   Codeine Rash   Erythromycin Rash, Hives   Levofloxacin Rash   Nitrofurantoin Hives   Percocet [oxycodone-acetaminophen] Rash      Medication List       Accurate as of Jul 01, 2020  4:55 PM. If you have any questions, ask your nurse or doctor.  STOP taking these medications   calcium-vitamin D 500-200 MG-UNIT tablet Commonly known as: OSCAL WITH D Stopped by: Orma Render, NP   ciprofloxacin 250 MG tablet Commonly known as: Cipro Stopped by: Orma Render, NP   gabapentin 300 MG capsule Commonly known as: NEURONTIN Stopped by: Orma Render, NP      TAKE these medications   ALPRAZolam 0.25 MG tablet Commonly known as: XANAX alprazolam 0.25 mg tablet  2 times a day as needed: 1 tab(s) 0.25 mg   amLODipine 5 MG tablet Commonly known as: NORVASC Take 5 mg by mouth daily.   aspirin EC 81 MG tablet Take 81 mg by mouth daily. Swallow whole.   atorvastatin 10 MG tablet Commonly known as: LIPITOR Take 10 mg by mouth daily.   cetirizine 10 MG tablet Commonly known as: ZYRTEC Take 10 mg by mouth daily.   diphenhydrAMINE 25 mg capsule Commonly known as: BENADRYL Take 1 capsule (25 mg total) by mouth every 6 (six) hours as needed. For mild urticaria, rash, or pruritis with ciprofloxacin use. Notify provider: [phone] if used.   EPINEPHrine 0.3 mg/0.3 mL Soaj injection Commonly known as: EPI-PEN SMARTSIG:0.3 Milligram(s) IM Once PRN   famotidine 40 MG tablet Commonly known as: PEPCID Take 1 tablet (40 mg total) by mouth 2 (two) times daily as needed. For mild urticaria, rash, or pruritis with ciprofloxacin use. Notify provider: [phone] if used.   hydrALAZINE 25 MG tablet Commonly known as: APRESOLINE Take 25 mg by mouth 2 (two) times daily.   hydrochlorothiazide 25 MG tablet Commonly known as: HYDRODIURIL Take 0.5 tablets (12.5 mg total) by mouth daily.   losartan 100 MG tablet Commonly known as: COZAAR Take 100 mg by mouth daily.   pantoprazole 40 MG tablet Commonly known as: PROTONIX TAKE ONE TABLET BY MOUTH EVERY MORNING FOR GERD   PARoxetine 40 MG tablet Commonly known as: PAXIL Take 40 mg by mouth daily.   Probiotic Formula 1-250 BILLION-MG Caps Take by mouth.   Sarna lotion Generic drug: camphor-menthol Apply 1 application topically as needed for itching.       History (reviewed): Past Medical History:  Diagnosis Date  . Allergy   . Anxiety   . Arthritis   . Blood transfusion without reported diagnosis   . Cancer ([place])    Melanoma on Great toe  . Cataract   . COPD (chronic obstructive  pulmonary disease) ([place])   . Depression   . GERD (gastroesophageal reflux disease)   . Hyperlipidemia   . Hypertension   . Osteoporosis   . Sleep apnea    Past Surgical History:  Procedure Laterality Date  . ABDOMINAL HYSTERECTOMY  1980  . CHOLECYSTECTOMY  1980  . MELANOMA EXCISION Right    Great Toe  . REPLACEMENT TOTAL KNEE Bilateral 2000   Family History  Problem Relation Age of Onset  . Cancer Mother   . Intellectual disability Mother   . Cancer Father   . Kani [place]auvin death Father   . Hypertension Father   . Cancer Sister   . Arthritis Maternal Grandmother   . Asthma Paternal Grandmother   . Asthma Paternal Grandfather    Social History   Socioeconomic History  . Marital status: Widowed    Spouse name: Not on file  . Number of children: Not on file  . Years of education: Not on file  . Highest education level: Not on file  Occupational History  . Not on file  Tobacco Use  .  Smoking status: Former Smoker    Types: Cigarettes    Quit date: 1988    Years since quitting: 34.4  . Smokeless tobacco: Never Used  Vaping Use  . Vaping Use: Never used  Substance and Sexual Activity  . Alcohol use: Not Currently  . Drug use: Never  . Sexual activity: Not Currently  Other Topics Concern  . Not on file  Social History Narrative  . Not on file   Social Determinants of Health   Financial Resource Strain: Low Risk   . Difficulty of Paying Living Expenses: Not hard at all  Food Insecurity: No Food Insecurity  . Worried About Charity fundraiser in the Last Year: Never true  . Ran Out of Food in the Last Year: Never true  Transportation Needs: No Transportation Needs  . Lack of Transportation (Medical): No  . Lack of Transportation (Non-Medical): No  Physical Activity: Sufficiently Active  . Days of Exercise per Week: 3 days  . Minutes of Exercise per Session: 60 min  Stress: No Stress Concern Present  . Feeling of Stress : Not at all  Social Connections: Moderately  Isolated  . Frequency of Communication with Friends and Family: More than three times a week  . Frequency of Social Gatherings with Friends and Family: More than three times a week  . Attends Religious Services: Never  . Active Member of Clubs or Organizations: Yes  . Attends Archivist Meetings: More than 4 times per year  . Marital Status: Widowed    Activities of Daily Living In your present state of health, do you have any difficulty performing the following activities: 07/01/2020 02/02/2020  Hearing? N N  Vision? N N  Difficulty concentrating or making decisions? Y N  Comment some memory lapses -  Walking or climbing stairs? Y N  Dressing or bathing? Y N  Doing errands, shopping? N N  Preparing Food and eating ? N N  Using the Toilet? N N  In the past six months, have you accidently leaked urine? Y Y  Comment - wears depends  Do you have problems with loss of bowel control? N N  Managing your Medications? Y N  Managing your Finances? Y N  Housekeeping or managing your Housekeeping? Y N  Some recent data might be hidden    Patient Education/ Literacy How often do you need to have someone help you when you read instructions, pamphlets, or other written materials from your doctor or pharmacy?: 1 - Never What is the last grade level you completed in school?: Associates degree  Exercise Current Exercise Habits: Structured exercise class, Type of exercise: calisthenics;stretching, Time (Minutes): 30, Frequency (Times/Week): 3, Weekly Exercise (Minutes/Week): 90, Intensity: Mild, Exercise limited by: orthopedic condition(s);respiratory conditions(s);cardiac condition(s)  Diet Patient reports consuming 3 meals a day and 1 snack(s) a day Patient reports that her primary diet is: Regular Patient reports that she does have regular access to food.   Depression Screen PHQ 2/9 Scores 07/01/2020 06/03/2020 02/02/2020 12/17/2019  PHQ - 2 Score 2 2 1  0  PHQ- 9 Score - 4 - -      Fall Risk Fall Risk  07/01/2020 06/03/2020 02/02/2020 12/17/2019  Falls in the past year? 0 0 0 0  Number falls in past yr: 0 0 0 0  Injury with Fall? 0 - 0 0  Risk for fall due to : Impaired mobility;Impaired balance/gait;History of fall(s) No Fall Risks - Impaired mobility  Follow up Falls evaluation completed;Education  provided;Falls prevention discussed - Falls prevention discussed Falls evaluation completed     Objective:  [name] [name] seemed alert and oriented and she participated appropriately during our telephone visit.  Blood Pressure Weight BMI  BP Readings from Last 3 Encounters:  07/01/20 (!) 134/49  06/03/20 121/77  04/06/20 109/60   Wt Readings from Last 3 Encounters:  06/03/20 252 lb 12.8 oz (114.7 kg)  05/25/20 253 lb (114.8 kg)  04/06/20 253 lb (114.8 kg)   BMI Readings from Last 1 Encounters:  06/03/20 43.39 kg/m    *Unable to obtain current vital signs, weight, and BMI due to telephone visit type  Hearing/Vision  . [name] [name] did not seem to have difficulty with hearing/understanding during the telephone conversation . Reports that she has not had a formal eye exam by an eye care professional within the past year . Reports that she has not had a formal hearing evaluation within the past year *Unable to fully assess hearing and vision during telephone visit type  Cognitive Function: 6CIT Screen 07/01/2020  What Year? 4 points  What month? 0 points  What time? 0 points  Count back from 20 0 points  Months in reverse 0 points  Repeat phrase 2 points  Total Score 6   (Normal:0-7, Significant for Dysfunction: >8)  Normal Cognitive Function Screening: Yes  Immunization & Health Maintenance Record Immunization History  Administered Date(s) Administered  . Influenza, High Dose Seasonal PF 11/07/2019  . Moderna Sars-Covid-2 Vaccination 05/05/2019, 12/18/2019  . Pneumococcal Conjugate-13 11/07/2018  . Pneumococcal Polysaccharide-23 02/07/2012     Health Maintenance  Topic Date Due  . Hepatitis C Screening  Never done  . Zoster Vaccines- Shingrix (1 of 2) Never done  . DEXA SCAN  Never done  . COVID-19 Vaccine (3 - Moderna risk 4-dose series) 01/15/2020  . TETANUS/TDAP  12/29/2020 (Originally 11/16/1959)  . INFLUENZA VACCINE  09/06/2020  . PNA vac Low Risk Adult  Completed  . HPV VACCINES  Aged Out       Assessment  This is a routine wellness examination for [name] [name].  Health Maintenance: Due or Overdue Health Maintenance Due  Topic Date Due  . Hepatitis C Screening  Never done  . Zoster Vaccines- Shingrix (1 of 2) Never done  . DEXA SCAN  Never done  . COVID-19 Vaccine (3 - Moderna risk 4-dose series) 01/15/2020  Reports she was told previously that the Zoster vaccine would not be safe for her due to it being a live vaccine.  Reports last Dexa was a little over a year ago- showed osteopenia.   [name] [name] does not need a referral for Community Assistance: Care Management:   no Social Work:    no Prescription Assistance:  no Nutrition/Diabetes Education:  no   Plan:  Personalized Goals Goals Addressed              This Visit's Progress   .  Patient Stated (pt-stated)        "Walk farther without shortness of breath and other problems"      Personalized Health Maintenance & Screening Recommendations  COVID-19 Booster (4th)  Lung Cancer Screening Recommended: no (Low Dose CT Chest recommended if Age 68-80 years, 30 pack-year currently smoking OR have quit w/in past 15 years) Hepatitis C Screening recommended: no HIV Screening recommended: no  Advanced Directives: Written information was not prepared per patient's request.  Referrals & Orders No orders of the defined types were placed in this encounter.   Follow-up Plan .  Follow-up with Aryssa Rosamond, Coralee Pesa, NP as planned . Schedule 1 year AWV . DEXA in 1 year . Will send information to assisted living facility on new orders and  changes: o Stop gabapentin due to intolerance o Start capsaicin rub- assisted living facility nursing staff may apply to feet up to three times a day for nerve pain o Eligible for 4th COVID-19 booster . Check on eligibility for Shingles vaccine- pt reported that she could not have this due to it being a live virus- will confirm.    I have personally reviewed and noted the following in the patient's chart:   . Medical and social history . Use of alcohol, tobacco or illicit drugs  . Current medications and supplements . Functional ability and status . Nutritional status . Physical activity . Advanced directives . List of other physicians . Hospitalizations, surgeries, and ER visits in previous 12 months . Vitals . Screenings to include cognitive, depression, and falls . Referrals and appointments  In addition, I have reviewed and discussed with [name] [name] certain preventive protocols, quality metrics, and best practice recommendations. A written personalized care plan for preventive services as well as general preventive health recommendations is available and can be mailed to the patient at her request.      Coralee Pesa. Devyn Griffing  07/01/2020

## 2020-07-02 ENCOUNTER — Encounter (HOSPITAL_BASED_OUTPATIENT_CLINIC_OR_DEPARTMENT_OTHER): Payer: Self-pay | Admitting: Nurse Practitioner

## 2020-07-09 ENCOUNTER — Ambulatory Visit (INDEPENDENT_AMBULATORY_CARE_PROVIDER_SITE_OTHER): Payer: Medicare HMO

## 2020-07-09 DIAGNOSIS — R55 Syncope and collapse: Secondary | ICD-10-CM | POA: Diagnosis not present

## 2020-07-09 LAB — CUP PACEART REMOTE DEVICE CHECK
Date Time Interrogation Session: 20220602232801
Implantable Pulse Generator Implant Date: 20191002

## 2020-07-11 NOTE — Progress Notes (Deleted)
Cardiology Office Note:    Date:  07/11/2020   ID:  [name] [name], DOB Dec 01, 1940, MRN [id number]  PCP:  Orma Render, NP  Cardiologist:  None  Electrophysiologist:  None   Referring MD: No ref. provider found   No chief complaint on file.   History of Present Illness:    [name] [name] is a 80 y.o. female with a hx of melanoma, COPD, hypertension, hyperlipidemia, OSA who presents for follow-up.  She was referred by [name] Noble, PA for evaluation of hypertension, initially seen on 12/30/2019.  She recently moved to [place] from [place].  Previously followed with Dr. Nolene [name] in [place], [place].  Reports that had episode of syncope 2 years ago and had loop recorder inserted.  Reports has not been interrogated in a while.  She denies any chest pain but does state that she gets short of breath with exertion.  She has been doing physical therapy.  She denies any lightheadedness.  Does report she has intermittent lower extremity edema.  States that she has been having pain in her legs.  Reports compliance with her CPAP.  Former smoker, quit in 1988.  No history of heart disease in her immediate family.  Echocardiogram 03/18/2020 showed normal biventricular function, no significant valvular disease.  Lexiscan Myoview on 05/26/2020 showed normal perfusion, EF 61%.  Since last clinic visit,  she reports that she is continued have shortness of breath with exertion.  States that it has been getting worse, feels short of breath with minimal exertion such as walking across the room.  Also reports that she has been having pressure in the center of her chest.  Has attributed it to indigestion, seems to be correlated to when she eats.  She denies any recent lightheadedness or syncope.  Denies pain in her legs when she walks.    Past Medical History:  Diagnosis Date  . Allergy   . Anxiety   . Arthritis   . Blood transfusion without reported diagnosis   . Cancer ([place])    Melanoma on  Great toe  . Cataract   . COPD (chronic obstructive pulmonary disease) ([place])   . Depression   . GERD (gastroesophageal reflux disease)   . Hyperlipidemia   . Hypertension   . Osteoporosis   . Sleep apnea     Past Surgical History:  Procedure Laterality Date  . ABDOMINAL HYSTERECTOMY  1980  . CHOLECYSTECTOMY  1980  . MELANOMA EXCISION Right    Great Toe  . REPLACEMENT TOTAL KNEE Bilateral 2000    Current Medications: No outpatient medications have been marked as taking for the 07/12/20 encounter (Appointment) with Donato Heinz, MD.     Allergies:   Azithromycin, Bee venom, Erythromycin base, Metronidazole, Nitrofurantoin macrocrystal, Penicillins, Sulfa antibiotics, Cymbalta [duloxetine hcl], Ivp dye [iodinated diagnostic agents], Penicillin g, Codeine, Erythromycin, Levofloxacin, Nitrofurantoin, and Percocet [oxycodone-acetaminophen]   Social History   Socioeconomic History  . Marital status: Widowed    Spouse name: Not on file  . Number of children: Not on file  . Years of education: Not on file  . Highest education level: Not on file  Occupational History  . Not on file  Tobacco Use  . Smoking status: Former Smoker    Types: Cigarettes    Quit date: 1988    Years since quitting: 34.4  . Smokeless tobacco: Never Used  Vaping Use  . Vaping Use: Never used  Substance and Sexual Activity  . Alcohol use: Not Currently  . Drug  use: Never  . Sexual activity: Not Currently  Other Topics Concern  . Not on file  Social History Narrative  . Not on file   Social Determinants of Health   Financial Resource Strain: Low Risk   . Difficulty of Paying Living Expenses: Not hard at all  Food Insecurity: No Food Insecurity  . Worried About Charity fundraiser in the Last Year: Never true  . Ran Out of Food in the Last Year: Never true  Transportation Needs: No Transportation Needs  . Lack of Transportation (Medical): No  . Lack of Transportation (Non-Medical): No   Physical Activity: Sufficiently Active  . Days of Exercise per Week: 3 days  . Minutes of Exercise per Session: 60 min  Stress: No Stress Concern Present  . Feeling of Stress : Not at all  Social Connections: Moderately Isolated  . Frequency of Communication with Friends and Family: More than three times a week  . Frequency of Social Gatherings with Friends and Family: More than three times a week  . Attends Religious Services: Never  . Active Member of Clubs or Organizations: Yes  . Attends Archivist Meetings: More than 4 times per year  . Marital Status: Widowed     Family History: The patient's family history includes Arthritis in her maternal grandmother; Asthma in her paternal grandfather and paternal grandmother; Cancer in her father, mother, and sister; Early death in her father; Hypertension in her father; Intellectual disability in her mother.  ROS:   Please see the history of present illness.     All other systems reviewed and are negative.  EKGs/Labs/Other Studies Reviewed:    The following studies were reviewed today:   EKG:  EKG is not ordered today.  The ekg ordered most recently demonstrates normal sinus rhythm, rate 98, poor R wave progression, left axis deviation, T wave inversions in leads I/aVL  Recent Labs: 12/29/2019: ALT 19; BUN 15; Creatinine, Ser 0.91; Hemoglobin 11.9; Platelets 312; Potassium 3.8; Sodium 138; TSH 2.710  Recent Lipid Panel    Component Value Date/Time   CHOL 133 12/29/2019 1434   TRIG 223 (H) 12/29/2019 1434   HDL 42 12/29/2019 1434   CHOLHDL 3.2 12/29/2019 1434   LDLCALC 55 12/29/2019 1434    Physical Exam:    VS:  There were no vitals taken for this visit.    Wt Readings from Last 3 Encounters:  06/03/20 252 lb 12.8 oz (114.7 kg)  05/25/20 253 lb (114.8 kg)  04/06/20 253 lb (114.8 kg)     GEN:  Well nourished, well developed in no acute distress HEENT: Normal NECK: No JVD; No carotid bruits CARDIAC:RRR, 2 out  of 6 systolic murmur RESPIRATORY:  Clear to auscultation without rales, wheezing or rhonchi  ABDOMEN: Soft, non-tender, non-distended MUSCULOSKELETAL:  No edema; No deformity  SKIN: Warm and dry NEUROLOGIC:  Alert and oriented x 3 PSYCHIATRIC:  Normal affect   ASSESSMENT:    No diagnosis found. PLAN:    Chest pain/dyspnea: Reports atypical chest pain but also having dyspnea with minimal exertion, could represent anginal equivalent.  She does have CAD risk factors including hypertension and hyperlipidemia, and has known PAD.  Echocardiogram 03/18/2020 showed normal biventricular function, no significant valvular disease.  Lexiscan Myoview on 05/26/2020 showed normal perfusion, EF 61%.  Syncope: Reports syncopal episode 2 years ago, had loop recorder placed.  Established with device clinic for loop interrogations, have been unremarkable  Hypertension: On hydralazine 25 mg twice daily, hydrochlorothiazide 12  5 mg daily, losartan 100 mg daily, amlodipine 5 mg daily.  Appears controlled  Hyperlipidemia: On atorvastatin 10 mg daily.  LDL 55 on 12/29/2019  PAD: ABIs show moderate disease on right (0.74) and mild left (0.89).  Duplex shows 50 to 74% stenosis in common femoral artery bilaterally, 30 to 49% stenosis in SFA.  Denies any symptoms of claudication -Continue ASA, statin   RTC in ***     Medication Adjustments/Labs and Tests Ordered: Current medicines are reviewed at length with the patient today.  Concerns regarding medicines are outlined above.  No orders of the defined types were placed in this encounter.  No orders of the defined types were placed in this encounter.   There are no Patient Instructions on file for this visit.   Signed, Donato Heinz, MD  07/11/2020 10:25 PM    [place]

## 2020-07-12 ENCOUNTER — Other Ambulatory Visit: Payer: Self-pay

## 2020-07-12 ENCOUNTER — Ambulatory Visit (INDEPENDENT_AMBULATORY_CARE_PROVIDER_SITE_OTHER): Payer: Medicare HMO | Admitting: Cardiology

## 2020-07-12 ENCOUNTER — Encounter: Payer: Self-pay | Admitting: Cardiology

## 2020-07-12 VITALS — BP 115/70 | HR 92 | Ht 65.0 in | Wt 251.0 lb

## 2020-07-12 DIAGNOSIS — I10 Essential (primary) hypertension: Secondary | ICD-10-CM

## 2020-07-12 DIAGNOSIS — R55 Syncope and collapse: Secondary | ICD-10-CM

## 2020-07-12 DIAGNOSIS — I739 Peripheral vascular disease, unspecified: Secondary | ICD-10-CM

## 2020-07-12 DIAGNOSIS — R0602 Shortness of breath: Secondary | ICD-10-CM | POA: Diagnosis not present

## 2020-07-12 DIAGNOSIS — R079 Chest pain, unspecified: Secondary | ICD-10-CM | POA: Diagnosis not present

## 2020-07-12 DIAGNOSIS — E785 Hyperlipidemia, unspecified: Secondary | ICD-10-CM

## 2020-07-12 NOTE — Patient Instructions (Signed)

## 2020-07-12 NOTE — Progress Notes (Signed)
Cardiology Office Note:    Date:  07/12/2020   ID:  [name] [name], DOB 06-08-1940, MRN [id number]  PCP:  Orma Render, NP  Cardiologist:  None  Electrophysiologist:  None   Referring MD: No ref. provider found   Chief Complaint  Patient presents with  . Shortness of Breath    History of Present Illness:    [name] [name] is a 81 y.o. female with a hx of melanoma, COPD, hypertension, hyperlipidemia, OSA who presents for follow-up.  She was referred by Raiford Noble, PA for evaluation of hypertension, initially seen on 12/30/2019.  She recently moved to [place] from [place].  Previously followed with Dr. Nolene Bernheim in [place], [place].  Reports that had episode of syncope 2 years ago and had loop recorder inserted.  Reports has not been interrogated in a while.  She denies any chest pain but does state that she gets short of breath with exertion.  She has been doing physical therapy.  She denies any lightheadedness.  Does report she has intermittent lower extremity edema.  States that she has been having pain in her legs.  Reports compliance with her CPAP.  Former smoker, quit in 1988.  No history of heart disease in her immediate family.  Echocardiogram 03/18/2020 showed normal biventricular function, no significant valvular disease.  Lexiscan Myoview on 05/26/2020 showed normal perfusion, EF 61%.  Today she is accompanied by her daughter-in-law. Since last clinic visit, she continues to have shortness of breath and they stopped her physical therapy because of this. Minimal exertion including walking down the hall causes her to be short of breath, and she recovers after a brief rest. Usually she does have LE edema but this is controlled with compression hose. She regularly uses her CPAP, and has used it for 15+ years. She remains compliant with her medication. She denies any chest pain, or palpitations. No headaches, lightheadedness, or syncope to report. Also has no orthopnea or  PND.    Past Medical History:  Diagnosis Date  . Allergy   . Anxiety   . Arthritis   . Blood transfusion without reported diagnosis   . Cancer ([place])    Melanoma on Great toe  . Cataract   . COPD (chronic obstructive pulmonary disease) ([place])   . Depression   . GERD (gastroesophageal reflux disease)   . Hyperlipidemia   . Hypertension   . Osteoporosis   . Sleep apnea     Past Surgical History:  Procedure Laterality Date  . ABDOMINAL HYSTERECTOMY  1980  . CHOLECYSTECTOMY  1980  . MELANOMA EXCISION Right    Great Toe  . REPLACEMENT TOTAL KNEE Bilateral 2000    Current Medications: Current Meds  Medication Sig  . ALPRAZolam (XANAX) 0.25 MG tablet alprazolam 0.25 mg tablet  2 times a day as needed: 1 tab(s) 0.25 mg  . amLODipine (NORVASC) 5 MG tablet Take 5 mg by mouth daily.  Marland Kitchen aspirin EC 81 MG tablet Take 81 mg by mouth daily. Swallow whole.  Marland Kitchen atorvastatin (LIPITOR) 10 MG tablet Take 10 mg by mouth daily.  . Bacillus Coagulans-Inulin (PROBIOTIC FORMULA) 1-250 BILLION-MG CAPS Take by mouth.  . camphor-menthol (SARNA) lotion Apply 1 application topically as needed for itching.  . cetirizine (ZYRTEC) 10 MG tablet Take 10 mg by mouth daily.  . diphenhydrAMINE (BENADRYL) 25 mg capsule Take 1 capsule (25 mg total) by mouth every 6 (six) hours as needed. For mild urticaria, rash, or pruritis with ciprofloxacin use. Notify provider: [phone] if  used.  Marland Kitchen EPINEPHrine 0.3 mg/0.3 mL IJ SOAJ injection SMARTSIG:0.3 Milligram(s) IM Once PRN  . famotidine (PEPCID) 40 MG tablet Take 1 tablet (40 mg total) by mouth 2 (two) times daily as needed. For mild urticaria, rash, or pruritis with ciprofloxacin use. Notify provider: [phone] if used.  . hydrALAZINE (APRESOLINE) 25 MG tablet Take 25 mg by mouth 2 (two) times daily.  . hydrochlorothiazide (HYDRODIURIL) 25 MG tablet Take 0.5 tablets (12.5 mg total) by mouth daily.  Marland Kitchen losartan (COZAAR) 100 MG tablet Take 100 mg by mouth daily.   . pantoprazole (PROTONIX) 40 MG tablet TAKE ONE TABLET BY MOUTH EVERY MORNING FOR GERD  . PARoxetine (PAXIL) 40 MG tablet Take 40 mg by mouth daily.     Allergies:   Azithromycin, Bee venom, Erythromycin base, Metronidazole, Nitrofurantoin macrocrystal, Penicillins, Sulfa antibiotics, Cymbalta [duloxetine hcl], Ivp dye [iodinated diagnostic agents], Penicillin g, Codeine, Erythromycin, Levofloxacin, Nitrofurantoin, and Percocet [oxycodone-acetaminophen]   Social History   Socioeconomic History  . Marital status: Widowed    Spouse name: Not on file  . Number of children: Not on file  . Years of education: Not on file  . Highest education level: Not on file  Occupational History  . Not on file  Tobacco Use  . Smoking status: Former Smoker    Types: Cigarettes    Quit date: 1988    Years since quitting: 34.4  . Smokeless tobacco: Never Used  Vaping Use  . Vaping Use: Never used  Substance and Sexual Activity  . Alcohol use: Not Currently  . Drug use: Never  . Sexual activity: Not Currently  Other Topics Concern  . Not on file  Social History Narrative  . Not on file   Social Determinants of Health   Financial Resource Strain: Low Risk   . Difficulty of Paying Living Expenses: Not hard at all  Food Insecurity: No Food Insecurity  . Worried About Charity fundraiser in the Last Year: Never true  . Ran Out of Food in the Last Year: Never true  Transportation Needs: No Transportation Needs  . Lack of Transportation (Medical): No  . Lack of Transportation (Non-Medical): No  Physical Activity: Sufficiently Active  . Days of Exercise per Week: 3 days  . Minutes of Exercise per Session: 60 min  Stress: No Stress Concern Present  . Feeling of Stress : Not at all  Social Connections: Moderately Isolated  . Frequency of Communication with Friends and Family: More than three times a week  . Frequency of Social Gatherings with Friends and Family: More than three times a week  .  Attends Religious Services: Never  . Active Member of Clubs or Organizations: Yes  . Attends Archivist Meetings: More than 4 times per year  . Marital Status: Widowed     Family History: The patient's family history includes Arthritis in her maternal grandmother; Asthma in her paternal grandfather and paternal grandmother; Cancer in her father, mother, and sister; Early death in her father; Hypertension in her father; Intellectual disability in her mother.  ROS:   Please see the history of present illness.    (+) Shortness of breath (+) LE edema All other systems reviewed and are negative.  EKGs/Labs/Other Studies Reviewed:    The following studies were reviewed today:  Lexiscan Myoview 05/26/2020:  The left ventricular ejection fraction is normal (55-65%).  Nuclear stress EF: 61%.  Blood pressure demonstrated a normal response to exercise.  There was no ST segment deviation  noted during stress.  The study is normal.  This is a low risk study.   Normal resting and stress perfusion. No ischemia or infarction EF 61%  Echo 03/18/2020: 1. Left ventricular ejection fraction, by estimation, is 60 to 65%. The  left ventricle has normal function. The left ventricle has no regional  wall motion abnormalities. There is mild concentric left ventricular  hypertrophy. Left ventricular diastolic  parameters are consistent with Grade I diastolic dysfunction (impaired  relaxation). Elevated left ventricular end-diastolic pressure.  2. Right ventricular systolic function is normal. The right ventricular  size is normal. Tricuspid regurgitation signal is inadequate for assessing  PA pressure.  3. The mitral valve is normal in structure. No evidence of mitral valve  regurgitation. No evidence of mitral stenosis.  4. The aortic valve is tricuspid. There is moderate calcification of the  aortic valve. There is mild thickening of the aortic valve. Aortic valve  regurgitation  is not visualized. Mild to moderate aortic valve  sclerosis/calcification is present, without  any evidence of aortic stenosis.  5. The inferior vena cava is normal in size with greater than 50%  respiratory variability, suggesting right atrial pressure of 3 mmHg.   [place] Lower Ext Art Seg Multi 01/22/2020: Right: Resting right ankle-brachial index indicates moderate right lower  extremity arterial disease.   Left: Resting left ankle-brachial index indicates mild left lower  extremity arterial disease. The left toe-brachial index is abnormal.   [place] LE Arterial Duplex 01/22/2020: Right: 50-74% stenosis noted in the common femoral artery. 30-49% stenosis  noted in the superficial femoral artery and/or popliteal artery.  Atherosclerosis in the common femoral, femoral, and popliteal and tibial  arteries.     Left: 50-74% stenosis noted in the common femoral artery. 30-49% stenosis  noted in the superficial femoral artery and/or popliteal artery.  Atherosclerosis in the common femoral, femoral, and popliteal and tibial  arteries.   EKG:   07/12/2020: NSR, rate 92, poor R wave progression, LAD 04/06/2020: EKG was not ordered. 12/29/2019: normal sinus rhythm, rate 98, poor R wave progression, left axis deviation, T wave inversions in leads I/aVL  Recent Labs: 12/29/2019: ALT 19; BUN 15; Creatinine, Ser 0.91; Hemoglobin 11.9; Platelets 312; Potassium 3.8; Sodium 138; TSH 2.710  Recent Lipid Panel    Component Value Date/Time   CHOL 133 12/29/2019 1434   TRIG 223 (H) 12/29/2019 1434   HDL 42 12/29/2019 1434   CHOLHDL 3.2 12/29/2019 1434   LDLCALC 55 12/29/2019 1434    Physical Exam:    VS:  BP 115/70   Pulse 92   Ht 5\' 5"  (1.651 m)   Wt 251 lb (113.9 kg)   SpO2 95%   BMI 41.77 kg/m     Wt Readings from Last 3 Encounters:  07/12/20 251 lb (113.9 kg)  06/03/20 252 lb 12.8 oz (114.7 kg)  05/25/20 253 lb (114.8 kg)     GEN:  Well nourished, well developed in no acute  distress HEENT: Normal NECK: No JVD; No carotid bruits CARDIAC: RRR, 2 out of 6 systolic murmur RESPIRATORY:  Clear to auscultation without rales, wheezing or rhonchi  ABDOMEN: Soft, non-tender, non-distended MUSCULOSKELETAL:  No edema; No deformity  SKIN: Warm and dry NEUROLOGIC:  Alert and oriented x 3 PSYCHIATRIC:  Normal affect   ASSESSMENT:    1. Shortness of breath   2. Chest pain of uncertain etiology   3. Essential hypertension   4. Syncope and collapse   5. Hyperlipidemia, unspecified hyperlipidemia type  6. PAD (peripheral artery disease) ([facility])    PLAN:    Chest pain/dyspnea: Reports atypical chest pain but also having dyspnea with minimal exertion, could represent anginal equivalent.  She does have CAD risk factors including hypertension and hyperlipidemia, and has known PAD.  Echocardiogram 03/18/2020 showed normal biventricular function, no significant valvular disease.  Lexiscan Myoview on 05/26/2020 showed normal perfusion, EF 61%.  Syncope: Reports syncopal episode 2 years ago, had loop recorder placed.  Established with device clinic for loop interrogations, have been unremarkable  Hypertension: On hydralazine 25 mg twice daily, hydrochlorothiazide 12 5 mg daily, losartan 100 mg daily, amlodipine 5 mg daily.  Appears controlled  Hyperlipidemia: On atorvastatin 10 mg daily.  LDL 55 on 12/29/2019  PAD: ABIs show moderate disease on right (0.74) and mild left (0.89).  Duplex shows 50 to 74% stenosis in common femoral artery bilaterally, 30 to 49% stenosis in SFA.  Denies any symptoms of claudication -Continue ASA, statin   OSA: on CPAP, reports compliance  RTC in 1 year   Medication Adjustments/Labs and Tests Ordered: Current medicines are reviewed at length with the patient today.  Concerns regarding medicines are outlined above.  Orders Placed This Encounter  Procedures  . EKG 12-Lead   No orders of the defined types were placed in this  encounter.   Patient Instructions  Medication Instructions:  Your physician recommends that you continue on your current medications as directed. Please refer to the Current Medication list given to you today.  *If you need a refill on your cardiac medications before your next appointment, please call your pharmacy*  Follow-Up: At [facility], you and your health needs are our priority.  As part of our continuing mission to provide you with exceptional heart care, we have created designated Provider Care Teams.  These Care Teams include your primary Cardiologist (physician) and Advanced Practice Providers (APPs -  Physician Assistants and Nurse Practitioners) who all work together to provide you with the care you need, when you need it.  We recommend signing up for the patient portal called "MyChart".  Sign up information is provided on this After Visit Summary.  MyChart is used to connect with patients for Virtual Visits (Telemedicine).  Patients are able to view lab/test results, encounter notes, upcoming appointments, etc.  Non-urgent messages can be sent to your provider as well.   To learn more about what you can do with MyChart, go to [email or web address].    Your next appointment:   12 month(s)  The format for your next appointment:   In Person  Provider:   Oswaldo Milian, MD      [facility],acting as a scribe for Donato Heinz, MD.,have documented all relevant documentation on the behalf of Donato Heinz, MD,as directed by  Donato Heinz, MD while in the presence of Donato Heinz, MD.  I, Donato Heinz, MD, have reviewed all documentation for this visit. The documentation on 07/12/20 for the exam, diagnosis, procedures, and orders are all accurate and complete.   Signed, Donato Heinz, MD  07/12/2020 2:48 PM    [facility]

## 2020-07-13 ENCOUNTER — Ambulatory Visit (INDEPENDENT_AMBULATORY_CARE_PROVIDER_SITE_OTHER): Payer: Medicare HMO

## 2020-07-13 ENCOUNTER — Encounter: Payer: Self-pay | Admitting: Pulmonary Disease

## 2020-07-13 ENCOUNTER — Telehealth: Payer: Self-pay | Admitting: Pulmonary Disease

## 2020-07-13 ENCOUNTER — Ambulatory Visit (INDEPENDENT_AMBULATORY_CARE_PROVIDER_SITE_OTHER): Payer: Medicare HMO | Admitting: Pulmonary Disease

## 2020-07-13 VITALS — BP 134/72 | HR 91 | Temp 97.1°F | Ht 64.5 in | Wt 251.2 lb

## 2020-07-13 DIAGNOSIS — R0602 Shortness of breath: Secondary | ICD-10-CM

## 2020-07-13 NOTE — Telephone Encounter (Signed)
Please order CT scan of the chest to follow-up on previous CT showed lung nodules  CT scan of the chest without contrast  Last CT of 12/19/19 recommended follow up CT

## 2020-07-13 NOTE — Patient Instructions (Signed)
Shortness of breath on exertion  Obtain chest x-ray Obtain pulmonary function test  Obstructive sleep apnea -Continue CPAP use  I will see you in about 4 weeks  Call with significant concerns

## 2020-07-13 NOTE — Progress Notes (Signed)
[name] [name]    [id number]    09-21-1940  Primary Care Physician:Early, Coralee Pesa, NP  Referring Physician: Orma Render, NP [place],  [place] [address]  Chief complaint:   Patient with shortness of breath on exertion History of obstructive sleep apnea-on CPAP therapy  HPI:  Has had CPAP for over 10 years Machine is probably 80 to 80 years old  Not having any problems with her CPAP  She is having some shortness of breath with activity Limited to about 100 steps, she does use a walker for balance issues Has had 2 knee replacements  Reformed smoker Never really had any label of lung disease Concerned about possible emphysema Smoked about a pack a day quit in 88  She is tolerating the CPAP well Sleeps well functions well Usually goes to bed between 10 and 11 Takes about 50 minutes or less to fall asleep No awakenings during the night Final wake up time about 10 AM Weight has been stable  She does have dryness of her mouth in the mornings feels better related to medications   Outpatient Encounter Medications as of 07/13/2020  Medication Sig  . ALPRAZolam (XANAX) 0.25 MG tablet alprazolam 0.25 mg tablet  2 times a day as needed: 1 tab(s) 0.25 mg  . amLODipine (NORVASC) 5 MG tablet Take 5 mg by mouth daily.  Marland Kitchen aspirin EC 81 MG tablet Take 81 mg by mouth daily. Swallow whole.  Marland Kitchen atorvastatin (LIPITOR) 10 MG tablet Take 10 mg by mouth daily.  . Bacillus Coagulans-Inulin (PROBIOTIC FORMULA) 1-250 BILLION-MG CAPS Take by mouth.  . camphor-menthol (SARNA) lotion Apply 1 application topically as needed for itching.  . cetirizine (ZYRTEC) 10 MG tablet Take 10 mg by mouth daily.  . diphenhydrAMINE (BENADRYL) 25 mg capsule Take 1 capsule (25 mg total) by mouth every 6 (six) hours as needed. For mild urticaria, rash, or pruritis with ciprofloxacin use. Notify provider: [phone] if used.  Marland Kitchen EPINEPHrine 0.3 mg/0.3 mL IJ SOAJ injection  SMARTSIG:0.3 Milligram(s) IM Once PRN  . famotidine (PEPCID) 40 MG tablet Take 1 tablet (40 mg total) by mouth 2 (two) times daily as needed. For mild urticaria, rash, or pruritis with ciprofloxacin use. Notify provider: [phone] if used.  . hydrALAZINE (APRESOLINE) 25 MG tablet Take 25 mg by mouth 2 (two) times daily.  . hydrochlorothiazide (HYDRODIURIL) 25 MG tablet Take 0.5 tablets (12.5 mg total) by mouth daily.  Marland Kitchen losartan (COZAAR) 100 MG tablet Take 100 mg by mouth daily.  . pantoprazole (PROTONIX) 40 MG tablet TAKE ONE TABLET BY MOUTH EVERY MORNING FOR GERD  . PARoxetine (PAXIL) 40 MG tablet Take 40 mg by mouth daily.   No facility-administered encounter medications on file as of 07/13/2020.    Allergies as of 07/13/2020 - Review Complete 07/13/2020  Allergen Reaction Noted  . Azithromycin Anaphylaxis and Hives 05/19/2019  . Bee venom Anaphylaxis 03/09/2020  . Erythromycin base Anaphylaxis 03/09/2020  . Metronidazole Anaphylaxis 05/19/2019  . Nitrofurantoin macrocrystal Anaphylaxis 03/09/2020  . Penicillins Anaphylaxis and Hives 05/19/2019  . Sulfa antibiotics Anaphylaxis and Hives 05/19/2019  . Cymbalta [duloxetine hcl]  12/17/2019  . Ivp dye [iodinated diagnostic agents]  12/17/2019  . Penicillin g Hives 06/03/2020  . Codeine Rash 12/17/2019  . Erythromycin Rash and Hives 05/19/2019  . Levofloxacin Rash 12/17/2019  . Nitrofurantoin Hives 05/19/2019  . Percocet [oxycodone-acetaminophen] Rash 12/17/2019    Past Medical History:  Diagnosis Date  .  Allergy   . Anxiety   . Arthritis   . Blood transfusion without reported diagnosis   . Cancer ([facility])    Melanoma on Great toe  . Cataract   . COPD (chronic obstructive pulmonary disease) ([facility])   . Depression   . GERD (gastroesophageal reflux disease)   . Hyperlipidemia   . Hypertension   . Osteoporosis   . Sleep apnea     Past Surgical History:  Procedure Laterality Date  . ABDOMINAL HYSTERECTOMY  1980  .  CHOLECYSTECTOMY  1980  . MELANOMA EXCISION Right    Great Toe  . REPLACEMENT TOTAL KNEE Bilateral 2000    Family History  Problem Relation Age of Onset  . Cancer Mother   . Intellectual disability Mother   . Cancer Father   . Early death Father   . Hypertension Father   . Cancer Sister   . Arthritis Maternal Grandmother   . Asthma Paternal Grandmother   . Asthma Paternal Grandfather     Social History   Socioeconomic History  . Marital status: Widowed    Spouse name: Not on file  . Number of children: Not on file  . Years of education: Not on file  . Highest education level: Not on file  Occupational History  . Not on file  Tobacco Use  . Smoking status: Former Smoker    Types: Cigarettes    Quit date: 1988    Years since quitting: 34.4  . Smokeless tobacco: Never Used  Vaping Use  . Vaping Use: Never used  Substance and Sexual Activity  . Alcohol use: Not Currently  . Drug use: Never  . Sexual activity: Not Currently  Other Topics Concern  . Not on file  Social History Narrative  . Not on file   Social Determinants of Health   Financial Resource Strain: Low Risk   . Difficulty of Paying Living Expenses: Not hard at all  Food Insecurity: No Food Insecurity  . Worried About Charity fundraiser in the Last Year: Never true  . Ran Out of Food in the Last Year: Never true  Transportation Needs: No Transportation Needs  . Lack of Transportation (Medical): No  . Lack of Transportation (Non-Medical): No  Physical Activity: Sufficiently Active  . Days of Exercise per Week: 3 days  . Minutes of Exercise per Session: 60 min  Stress: No Stress Concern Present  . Feeling of Stress : Not at all  Social Connections: Moderately Isolated  . Frequency of Communication with Friends and Family: More than three times a week  . Frequency of Social Gatherings with Friends and Family: More than three times a week  . Attends Religious Services: Never  . Active Member of Clubs  or Organizations: Yes  . Attends Archivist Meetings: More than 4 times per year  . Marital Status: Widowed  Intimate Partner Violence: Not At Risk  . Fear of Current or Ex-Partner: No  . Emotionally Abused: No  . Physically Abused: No  . Sexually Abused: No    Review of Systems  Constitutional: Negative for fatigue.  Respiratory: Positive for shortness of breath. Negative for cough.   Cardiovascular: Negative for chest pain.  Psychiatric/Behavioral: Negative for sleep disturbance.    Vitals:   07/13/20 1121  BP: 134/72  Pulse: 91  Temp: (!) 97.1 F (36.2 C)  SpO2: 97%     Physical Exam Constitutional:      Appearance: She is obese.  HENT:  Head: Normocephalic and atraumatic.     Right Ear: Tympanic membrane normal.     Nose: Nose normal. No congestion.     Mouth/Throat:     Mouth: Mucous membranes are moist.     Pharynx: No oropharyngeal exudate.  Eyes:     General:        Right eye: No discharge.        Left eye: No discharge.  Cardiovascular:     Rate and Rhythm: Normal rate and regular rhythm.     Heart sounds: No murmur heard. No friction rub.  Pulmonary:     Effort: No respiratory distress.     Breath sounds: No stridor. No wheezing or rhonchi.  Musculoskeletal:     Cervical back: No rigidity or tenderness.  Neurological:     Mental Status: She is alert.    Data Reviewed: Recent echocardiogram February 2022-no abnormality noted CT report from 12/17/2018 did show a lung nodule-largest being about 6 mm   Assessment:  Shortness of breath with exertion -We will obtain chest x-ray -Obtain pulmonary function test  Obstructive sleep apnea -Compliant with CPAP therapy -Does have enough supplies -Has no concerns about her sleep apnea at present  Past history of smoking -30-pack-year smoking history  Abnormal CT scan of the chest from 2020 showing lung nodules - this requires a follow-up CT  Plan/Recommendations: We will obtain chest  x-ray today  Continue CPAP therapy  Obtain pulmonary function tests shortness of breath on exertion   I will see her back in about 4 weeks  I will order a CT scan of the chest to compare with previous   Sherrilyn Rist MD [facility] 07/13/2020, 11:55 AM  CC: Early, Coralee Pesa, NP

## 2020-07-13 NOTE — Addendum Note (Signed)
Addended by: Elie Confer on: 07/13/2020 02:06 PM   Modules accepted: Orders

## 2020-07-15 ENCOUNTER — Telehealth (HOSPITAL_BASED_OUTPATIENT_CLINIC_OR_DEPARTMENT_OTHER): Payer: Self-pay

## 2020-07-15 NOTE — Telephone Encounter (Signed)
Patient reports she has an infection in her foot and the cream she was prescribed burns.  She states she in pain and cannot walk on her foot.

## 2020-07-21 ENCOUNTER — Other Ambulatory Visit: Payer: Medicare HMO

## 2020-07-22 ENCOUNTER — Other Ambulatory Visit: Payer: Self-pay

## 2020-07-22 ENCOUNTER — Ambulatory Visit (INDEPENDENT_AMBULATORY_CARE_PROVIDER_SITE_OTHER)
Admission: RE | Admit: 2020-07-22 | Discharge: 2020-07-22 | Disposition: A | Discharge status: Home / Self Care | Payer: Medicare HMO | Source: Ambulatory Visit | Attending: Pulmonary Disease | Admitting: Pulmonary Disease

## 2020-07-22 DIAGNOSIS — R0602 Shortness of breath: Secondary | ICD-10-CM

## 2020-07-24 DIAGNOSIS — R911 Solitary pulmonary nodule: Secondary | ICD-10-CM

## 2020-07-26 NOTE — Telephone Encounter (Signed)
Patient had CT chest completed 07/23/20 and requested to know if any follow up was needed?   Message routed to Dr.Olalere to advise

## 2020-07-26 NOTE — Telephone Encounter (Signed)
Refer to multidisciplinary thoracic oncology conference  Lung nodule-increasing in size-base of right lung

## 2020-07-27 NOTE — Progress Notes (Signed)
Carelink Summary Report / Loop Recorder 

## 2020-07-29 NOTE — Telephone Encounter (Signed)
Patient sent email  I am leaning toward a Petscan to find out what it is an how to treat it. When I was in [place] I had two petscans. Should we get them for comparison?  Let me know what you think  I told them yes to send a disc and fax report.   Sending to AO and SG for further recommendations.

## 2020-07-30 NOTE — Telephone Encounter (Signed)
Per Rica Mast, I think the best option is to get this patient set up with Dr. Lamonte Sakai or Icard in a nodule spot to review scans and determine if they need a biopsy. What I cannot determine from this message trail, is if the patient is in [place]. If not perhaps this can be set up as a video visit after either Byrum or Icard have reviewed the films. This is Dr. Judson Roch call. So please see if he is in agreement.  If she is out of state, she may need to seek care there.     Sending to Dr. Ander Slade for further recommendations

## 2020-07-30 NOTE — Telephone Encounter (Signed)
It is okay to set up an appointment with Dr. Lamonte Sakai or Icard  I did send a message   We can schedule for a PET scan to show if the spot is active

## 2020-08-02 ENCOUNTER — Other Ambulatory Visit: Payer: Self-pay | Admitting: Pulmonary Disease

## 2020-08-02 DIAGNOSIS — R911 Solitary pulmonary nodule: Secondary | ICD-10-CM

## 2020-08-02 NOTE — Telephone Encounter (Signed)
Dr Ander Slade, please advise on pt email. I have ordered PET. Please advise if still needs to see Byrum since seeing [place]. Thanks!   Yes I think we need to schedule a pet scan. Don't understand why we need to see Dr. Lamonte Sakai Already seeing an oncologist Dr. Alen Blew   PET scan needs to be scheduled on Monday Tuesday  or  Thursday for transportation reasons. Thank you for your help

## 2020-08-03 NOTE — Telephone Encounter (Signed)
Dr. Alen Blew the oncologist will only be able to treat once a diagnosis is made  Needs to see Dr. Lamonte Sakai to discuss best approach to getting a diagnosis made  Yes, appointment with Dr. Lamonte Sakai is appropriate

## 2020-08-11 ENCOUNTER — Ambulatory Visit (INDEPENDENT_AMBULATORY_CARE_PROVIDER_SITE_OTHER): Payer: Medicare HMO

## 2020-08-11 DIAGNOSIS — R55 Syncope and collapse: Secondary | ICD-10-CM | POA: Diagnosis not present

## 2020-08-12 LAB — CUP PACEART REMOTE DEVICE CHECK
Date Time Interrogation Session: 20220705234037
Implantable Pulse Generator Implant Date: 20191002

## 2020-08-13 ENCOUNTER — Other Ambulatory Visit (INDEPENDENT_AMBULATORY_CARE_PROVIDER_SITE_OTHER): Payer: Medicare HMO | Admitting: Nurse Practitioner

## 2020-08-13 DIAGNOSIS — K219 Gastro-esophageal reflux disease without esophagitis: Secondary | ICD-10-CM

## 2020-08-13 MED ORDER — FAMOTIDINE 20 MG PO TABS: 20.0000 mg | ORAL_TABLET | Freq: Every day | ORAL | 3 refills | Status: DC | PRN

## 2020-08-13 NOTE — Progress Notes (Signed)
Fax received from [facility] requesting confirmation of Famotidine 40mg  BID PRN order for mild urticaria, rash, or pruritus with Ciprofloxacin use.  Patient has completed Ciprofloxacin.  Per message, patient does have symptoms of acid reflux/heartburn.   Order changed to: Famotidine 20mg  daily PRN for GERD symptoms.   Prescription sent to pharmacy with 90 day supply.    Message to be faxed to [facility] with new orders.  Orma Render, DNP, AGNP-cc

## 2020-08-17 ENCOUNTER — Encounter (HOSPITAL_BASED_OUTPATIENT_CLINIC_OR_DEPARTMENT_OTHER): Payer: Self-pay | Admitting: Nurse Practitioner

## 2020-08-17 ENCOUNTER — Telehealth (HOSPITAL_BASED_OUTPATIENT_CLINIC_OR_DEPARTMENT_OTHER): Payer: Self-pay | Admitting: Nurse Practitioner

## 2020-08-17 DIAGNOSIS — N3090 Cystitis, unspecified without hematuria: Secondary | ICD-10-CM

## 2020-08-17 MED ORDER — CIPROFLOXACIN HCL 250 MG PO TABS: 250.0000 mg | ORAL_TABLET | Freq: Two times a day (BID) | ORAL | 0 refills | Status: DC

## 2020-08-17 NOTE — Telephone Encounter (Signed)
Results received from [facility] at [facility] with results for patient.   Positive Nitrites, WBC, and trace blood present.   Treatment for ciprofloxacin 250mg  BID x 5 days sent to pharmacy.   If symptoms persist after treatment, patient will need to be seen in person for evaluation.

## 2020-08-18 ENCOUNTER — Telehealth: Payer: Self-pay | Admitting: *Deleted

## 2020-08-18 NOTE — Telephone Encounter (Signed)
-----   Message from Garner Nash, DO sent at 08/17/2020  8:52 PM EDT ----- Regarding: Needs Appt Can you please schedule with me or Rob? Next available nodule slot to discuss options.  Please reply back so I know it's been scheduled   Thanks  Leory Plowman ----- Message ----- From: Laurin Coder, MD Sent: 07/27/2020   9:04 AM EDT To: Collene Gobble, MD, Garner Nash, DO  Can either one of you gentleman help with this lady with a lung nodule?  Still only 7 mm but increased from 5 mm from an outside CT from 2020  It is basal, structural changes with tentacles, not thinking it is amenable to bronchoscopic biopsy but, will you consider taking it out?  Past smoker-quit smoking in 1988  Thank you

## 2020-08-18 NOTE — Telephone Encounter (Signed)
ATC patient unable to reach left message to call back.  Patient needs appointment with BI or RB 30 min slot. Can use nodule slot.  Current patient of Dr. Ander Slade so not a consult.  Patient having PFT on the 28th with OV with AO after.  So won't be able to do the BI or RB on that day.   Please schedule patient 30 min next available with RB or BI   Routing to AO to see if patient can see BI or RB before or after PFT and reschedule appt with AO for pft.

## 2020-08-19 ENCOUNTER — Other Ambulatory Visit: Payer: Self-pay

## 2020-08-19 ENCOUNTER — Encounter (HOSPITAL_COMMUNITY)
Admission: RE | Admit: 2020-08-19 | Discharge: 2020-08-19 | Disposition: A | Discharge status: Home / Self Care | Payer: Medicare HMO | Source: Ambulatory Visit | Attending: Pulmonary Disease | Admitting: Pulmonary Disease

## 2020-08-19 DIAGNOSIS — N281 Cyst of kidney, acquired: Secondary | ICD-10-CM | POA: Insufficient documentation

## 2020-08-19 DIAGNOSIS — R911 Solitary pulmonary nodule: Secondary | ICD-10-CM | POA: Insufficient documentation

## 2020-08-19 LAB — GLUCOSE, CAPILLARY: Glucose-Capillary: 129 mg/dL — ABNORMAL HIGH (ref 70–99)

## 2020-08-19 MED ORDER — FLUDEOXYGLUCOSE F - 18 (FDG) INJECTION
13.0000 | Freq: Once | INTRAVENOUS | Status: AC
  Administered 2020-08-19: 12.47 via INTRAVENOUS

## 2020-08-20 ENCOUNTER — Other Ambulatory Visit (HOSPITAL_BASED_OUTPATIENT_CLINIC_OR_DEPARTMENT_OTHER): Payer: Self-pay

## 2020-08-25 NOTE — Telephone Encounter (Signed)
Yes, can schedule with Byrum or Icard and reschedule with me later

## 2020-08-25 NOTE — Telephone Encounter (Signed)
Pt scheduled for OV with Dr. Valeta Harms on 09/02/20 and OV with Dr. Ander Slade was cancelled. Nothing further needed at this time.

## 2020-08-27 ENCOUNTER — Ambulatory Visit: Payer: Medicare HMO | Admitting: Emergency Medicine

## 2020-08-30 ENCOUNTER — Other Ambulatory Visit (HOSPITAL_COMMUNITY)
Admission: RE | Admit: 2020-08-30 | Discharge: 2020-08-30 | Disposition: A | Discharge status: Home / Self Care | Payer: Medicare HMO | Source: Ambulatory Visit | Attending: Pulmonary Disease | Admitting: Pulmonary Disease

## 2020-08-30 DIAGNOSIS — Z01812 Encounter for preprocedural laboratory examination: Secondary | ICD-10-CM | POA: Insufficient documentation

## 2020-08-30 DIAGNOSIS — Z20822 Contact with and (suspected) exposure to covid-19: Secondary | ICD-10-CM | POA: Insufficient documentation

## 2020-08-30 LAB — SARS CORONAVIRUS 2 (TAT 6-24 HRS): SARS Coronavirus 2: NEGATIVE

## 2020-09-01 NOTE — Progress Notes (Signed)
Carelink Summary Report / Loop Recorder 

## 2020-09-02 ENCOUNTER — Encounter: Payer: Self-pay | Admitting: Pulmonary Disease

## 2020-09-02 ENCOUNTER — Ambulatory Visit (INDEPENDENT_AMBULATORY_CARE_PROVIDER_SITE_OTHER): Payer: Medicare HMO | Admitting: Pulmonary Disease

## 2020-09-02 ENCOUNTER — Other Ambulatory Visit: Payer: Self-pay

## 2020-09-02 ENCOUNTER — Ambulatory Visit: Payer: Medicare HMO | Admitting: Pulmonary Disease

## 2020-09-02 VITALS — BP 132/88 | HR 98 | Ht 64.0 in | Wt 253.2 lb

## 2020-09-02 DIAGNOSIS — R0602 Shortness of breath: Secondary | ICD-10-CM | POA: Diagnosis not present

## 2020-09-02 DIAGNOSIS — R911 Solitary pulmonary nodule: Secondary | ICD-10-CM

## 2020-09-02 DIAGNOSIS — Z87891 Personal history of nicotine dependence: Secondary | ICD-10-CM | POA: Diagnosis not present

## 2020-09-02 LAB — PULMONARY FUNCTION TEST
DL/VA % pred: 97 %
DL/VA: 3.96 ml/min/mmHg/L
DLCO cor % pred: 74 %
DLCO cor: 14.28 ml/min/mmHg
DLCO unc % pred: 74 %
DLCO unc: 14.28 ml/min/mmHg
FEF 25-75 Post: 1.91 L/sec
FEF 25-75 Pre: 1.31 L/sec
FEF2575-%Change-Post: 45 %
FEF2575-%Pred-Post: 128 %
FEF2575-%Pred-Pre: 87 %
FEV1-%Change-Post: 9 %
FEV1-%Pred-Post: 75 %
FEV1-%Pred-Pre: 68 %
FEV1-Post: 1.52 L
FEV1-Pre: 1.39 L
FEV1FVC-%Change-Post: 4 %
FEV1FVC-%Pred-Pre: 107 %
FEV6-%Change-Post: 4 %
FEV6-%Pred-Post: 70 %
FEV6-%Pred-Pre: 67 %
FEV6-Post: 1.81 L
FEV6-Pre: 1.73 L
FEV6FVC-%Pred-Post: 105 %
FEV6FVC-%Pred-Pre: 105 %
FVC-%Change-Post: 4 %
FVC-%Pred-Post: 66 %
FVC-%Pred-Pre: 63 %
FVC-Post: 1.81 L
FVC-Pre: 1.73 L
Post FEV1/FVC ratio: 84 %
Post FEV6/FVC ratio: 100 %
Pre FEV1/FVC ratio: 80 %
Pre FEV6/FVC Ratio: 100 %
RV % pred: 131 %
RV: 3.16 L
TLC % pred: 102 %
TLC: 5.24 L

## 2020-09-02 MED ORDER — TRELEGY ELLIPTA 100-62.5-25 MCG/INH IN AEPB: 1.0000 | INHALATION_SPRAY | Freq: Every day | RESPIRATORY_TRACT | 0 refills | Status: DC

## 2020-09-02 NOTE — Progress Notes (Signed)
Synopsis: Referred in July 2022 for lung nodule, by Dr.Olalere, PCP: By Orma Render, NP  Subjective:   PATIENT ID: [name] [name] GENDER: female DOB: 09-01-1940, MRN: [id number]  Chief Complaint  Patient presents with   Follow-up    Lung nodule.    This is a 80 year old female, patient of Dr. Ander Slade, referred for right lower lobe pulmonary nodule.  She is a former smoker quit in 1988.  She had a CT scan of the chest completed in November 2020 that is visible in PACS.  This showed a 4 mm right lower lobe pulmonary nodule.  She had CT scan follow-up that was completed.  CT follow-up imaging was completed on 07/22/2020.  This CT scan of the chest revealed a 7 mm right lower lobe pulmonary nodule that was previously 4 mm and had increased in size and now has slight spiculated margins.  She had this followed by a nuclear medicine pet image on 08/19/2020.  Nuclear medicine pet imaging shows no significant hypermetabolism however the lesion itself is smaller than the lower limit for size related to PET scans.  From a respiratory standpoint she does feel short of breath with exertion.  Her mobility is aided by a rolling walker.  She is currently using as needed albuterol.  Has been told she may have COPD in the past.  She does have PFTs scheduled for today which have not been completed yet.   Past Medical History:  Diagnosis Date   Allergy    Anxiety    Arthritis    Blood transfusion without reported diagnosis    Cancer ([place])    Melanoma on Great toe   Cataract    COPD (chronic obstructive pulmonary disease) ([place])    Depression    GERD (gastroesophageal reflux disease)    Hyperlipidemia    Hypertension    Osteoporosis    Sleep apnea      Family History  Problem Relation Age of Onset   Cancer Mother    Intellectual disability Mother    Cancer Father    Early death Father    Hypertension Father    Cancer Sister    Arthritis Maternal Grandmother    Asthma Paternal Grandmother     Asthma Paternal Grandfather      Past Surgical History:  Procedure Laterality Date   ABDOMINAL HYSTERECTOMY  1980   CHOLECYSTECTOMY  1980   MELANOMA EXCISION Right    Great Toe   REPLACEMENT TOTAL KNEE Bilateral 2000    Social History   Socioeconomic History   Marital status: Widowed    Spouse name: Not on file   Number of children: Not on file   Years of education: Not on file   Highest education level: Not on file  Occupational History   Not on file  Tobacco Use   Smoking status: Former    Types: Cigarettes    Start date: 53    Quit date: 56    Years since quitting: 34.5   Smokeless tobacco: Never  Vaping Use   Vaping Use: Never used  Substance and Sexual Activity   Alcohol use: Not Currently   Drug use: Never   Sexual activity: Not Currently  Other Topics Concern   Not on file  Social History Narrative   Not on file   Social Determinants of Health   Financial Resource Strain: Low Risk    Difficulty of Paying Living Expenses: Not hard at all  Food Insecurity: No Food Insecurity  Worried About Charity fundraiser in the Last Year: Never true   [place] in the Last Year: Never true  Transportation Needs: No Transportation Needs   Lack of Transportation (Medical): No   Lack of Transportation (Non-Medical): No  Physical Activity: Sufficiently Active   Days of Exercise per Week: 3 days   Minutes of Exercise per Session: 60 min  Stress: No Stress Concern Present   Feeling of Stress : Not at all  Social Connections: Moderately Isolated   Frequency of Communication with Friends and Family: More than three times a week   Frequency of Social Gatherings with Friends and Family: More than three times a week   Attends Religious Services: Never   Marine scientist or Organizations: Yes   Attends Music therapist: More than 4 times per year   Marital Status: Widowed  Human resources officer Violence: Not At Risk   Fear of Current or Ex-Partner:  No   Emotionally Abused: No   Physically Abused: No   Sexually Abused: No     Allergies  Allergen Reactions   Azithromycin Anaphylaxis and Hives   Bee Venom Anaphylaxis   Erythromycin Base Anaphylaxis   Metronidazole Anaphylaxis   Nitrofurantoin Macrocrystal Anaphylaxis   Penicillins Anaphylaxis and Hives    childhood   Sulfa Antibiotics Anaphylaxis and Hives    Childhood   Cymbalta [Duloxetine Hcl]    Ivp Dye [Iodinated Diagnostic Agents]    Penicillin G Hives   Codeine Rash   Erythromycin Rash and Hives   Levofloxacin Rash   Nitrofurantoin Hives   Percocet [Oxycodone-Acetaminophen] Rash     Outpatient Medications Prior to Visit  Medication Sig Dispense Refill   albuterol (VENTOLIN HFA) 108 (90 Base) MCG/ACT inhaler Inhale 1-2 puffs into the lungs every 6 (six) hours as needed for wheezing or shortness of breath.     ALPRAZolam (XANAX) 0.25 MG tablet alprazolam 0.25 mg tablet  2 times a day as needed: 1 tab(s) 0.25 mg     amLODipine (NORVASC) 5 MG tablet Take 5 mg by mouth daily.     aspirin EC 81 MG tablet Take 81 mg by mouth daily. Swallow whole.     atorvastatin (LIPITOR) 10 MG tablet Take 10 mg by mouth daily.     camphor-menthol (SARNA) lotion Apply 1 application topically as needed for itching. 222 mL 0   cetirizine (ZYRTEC) 10 MG tablet Take 10 mg by mouth daily.     diphenhydrAMINE (BENADRYL) 25 mg capsule Take 1 capsule (25 mg total) by mouth every 6 (six) hours as needed. For mild urticaria, rash, or pruritis with ciprofloxacin use. Notify provider: [phone] if used. 10 capsule 1   EPINEPHrine 0.3 mg/0.3 mL IJ SOAJ injection SMARTSIG:0.3 Milligram(s) IM Once PRN     famotidine (PEPCID) 20 MG tablet Take 1 tablet (20 mg total) by mouth daily as needed for heartburn or indigestion. 90 tablet 3   hydrALAZINE (APRESOLINE) 25 MG tablet Take 25 mg by mouth 2 (two) times daily.     hydrochlorothiazide (HYDRODIURIL) 25 MG tablet Take 0.5 tablets (12.5 mg total) by  mouth daily. 45 tablet 1   losartan (COZAAR) 100 MG tablet Take 100 mg by mouth daily.     pantoprazole (PROTONIX) 40 MG tablet TAKE ONE TABLET BY MOUTH EVERY MORNING FOR GERD 30 tablet 0   PARoxetine (PAXIL) 40 MG tablet Take 40 mg by mouth daily.     Probiotic Product (PROBIOTIC ACIDOPHILUS BEADS) CAPS Take 250  mg by mouth 2 (two) times daily.     Bacillus Coagulans-Inulin (PROBIOTIC FORMULA) 1-250 BILLION-MG CAPS Take by mouth. (Patient not taking: Reported on 09/02/2020)     ciprofloxacin (CIPRO) 250 MG tablet Take 1 tablet (250 mg total) by mouth 2 (two) times daily. For UTI (Patient not taking: Reported on 09/02/2020) 10 tablet 0   No facility-administered medications prior to visit.    Review of Systems  Constitutional:  Negative for chills, fever, malaise/fatigue and weight loss.  HENT:  Negative for hearing loss, sore throat and tinnitus.   Eyes:  Negative for blurred vision and double vision.  Respiratory:  Positive for shortness of breath. Negative for cough, hemoptysis, sputum production, wheezing and stridor.   Cardiovascular:  Negative for chest pain, palpitations, orthopnea, leg swelling and PND.  Gastrointestinal:  Negative for abdominal pain, constipation, diarrhea, heartburn, nausea and vomiting.  Genitourinary:  Negative for dysuria, hematuria and urgency.  Musculoskeletal:  Negative for joint pain and myalgias.  Skin:  Negative for itching and rash.  Neurological:  Negative for dizziness, tingling, weakness and headaches.  Endo/Heme/Allergies:  Negative for environmental allergies. Does not bruise/bleed easily.  Psychiatric/Behavioral:  Negative for depression. The patient is not nervous/anxious and does not have insomnia.   All other systems reviewed and are negative.   Objective:  Physical Exam Vitals reviewed.  Constitutional:      General: She is not in acute distress.    Appearance: She is well-developed.  HENT:     Head: Normocephalic and atraumatic.  Eyes:      General: No scleral icterus.    Conjunctiva/sclera: Conjunctivae normal.     Pupils: Pupils are equal, round, and reactive to light.  Neck:     Vascular: No JVD.     Trachea: No tracheal deviation.  Cardiovascular:     Rate and Rhythm: Normal rate and regular rhythm.     Heart sounds: Normal heart sounds. No murmur heard. Pulmonary:     Effort: Pulmonary effort is normal. No tachypnea, accessory muscle usage or respiratory distress.     Breath sounds: No stridor. No wheezing, rhonchi or rales.  Abdominal:     General: Bowel sounds are normal. There is no distension.     Palpations: Abdomen is soft.     Tenderness: There is no abdominal tenderness.  Musculoskeletal:        General: No tenderness.     Cervical back: Neck supple.  Lymphadenopathy:     Cervical: No cervical adenopathy.  Skin:    General: Skin is warm and dry.     Capillary Refill: Capillary refill takes less than 2 seconds.     Findings: No rash.  Neurological:     Mental Status: She is alert and oriented to person, place, and time.  Psychiatric:        Behavior: Behavior normal.     Vitals:   09/02/20 1134  BP: 132/88  Pulse: 98  SpO2: 98%  Weight: 253 lb 3.2 oz (114.9 kg)  Height: '5\' 4"'$  (1.626 m)   98% on RA  BMI Readings from Last 3 Encounters:  09/02/20 43.46 kg/m  07/13/20 42.46 kg/m  07/12/20 41.77 kg/m   Wt Readings from Last 3 Encounters:  09/02/20 253 lb 3.2 oz (114.9 kg)  07/13/20 251 lb 4 oz (114 kg)  07/12/20 251 lb (113.9 kg)     CBC    Component Value Date/Time   WBC 9.3 12/29/2019 1434   RBC 4.25 12/29/2019 1434  HGB 11.9 12/29/2019 1434   HCT 36.4 12/29/2019 1434   PLT 312 12/29/2019 1434   MCV 86 12/29/2019 1434   MCH 28.0 12/29/2019 1434   MCHC 32.7 12/29/2019 1434   RDW 14.5 12/29/2019 1434      Chest Imaging: June 2022 CT chest: Small 7 mm right lower lobe pulmonary nodule adjacent to the pleura.  Slowly enlarging.  Last CT scan was in November 2020.  It  has gotten bigger since 2020. The patient's images have been independently reviewed by me.    Pulmonary Functions Testing Results: No flowsheet data found.  FeNO:   Pathology:   Echocardiogram:   Heart Catheterization:     Assessment & Plan:     ICD-10-CM   1. Lung nodule  R91.1 CT Super D Chest Wo Contrast    2. Former smoker  Z87.891     3. Right lower lobe pulmonary nodule  R91.1       Discussion:  This is a 80 year old female, right lower lobe pulmonary nodule, slowly enlarging since 2020.  Patient functional status is walking with a rolling walker.  She does have shortness of breath with exertion.  She is a former smoker quit 1988.  She is not interested in having any type of large thoracic surgery.  She is also not super interested in even obtaining biopsy of this small lesion.  We discussed the pros and cons of consideration for robotic assisted bronchoscopy and being able to go out on diagnostic yield for small lesions as she has.  Plan: After all of this discussion and review of images I think the next best step is to proceed with a 66-monthfollow-up CT scan. If the lesion is enlarging we can consider tissue biopsy, fiducial placement and referral to radiation oncology for SBRT. She would not want to have a surgical resection for this lesion. We discussed the pros and cons of each of these options. She would like to have a more conservative approach as the nodule has at least been there since 2020. I agree with this plan. Repeat noncontrasted CT scan of the chest in 6 months. Follow-up with me after the CT to discuss next steps.    Current Outpatient Medications:    albuterol (VENTOLIN HFA) 108 (90 Base) MCG/ACT inhaler, Inhale 1-2 puffs into the lungs every 6 (six) hours as needed for wheezing or shortness of breath., Disp: , Rfl:    ALPRAZolam (XANAX) 0.25 MG tablet, alprazolam 0.25 mg tablet  2 times a day as needed: 1 tab(s) 0.25 mg, Disp: , Rfl:    amLODipine  (NORVASC) 5 MG tablet, Take 5 mg by mouth daily., Disp: , Rfl:    aspirin EC 81 MG tablet, Take 81 mg by mouth daily. Swallow whole., Disp: , Rfl:    atorvastatin (LIPITOR) 10 MG tablet, Take 10 mg by mouth daily., Disp: , Rfl:    camphor-menthol (SARNA) lotion, Apply 1 application topically as needed for itching., Disp: 222 mL, Rfl: 0   cetirizine (ZYRTEC) 10 MG tablet, Take 10 mg by mouth daily., Disp: , Rfl:    diphenhydrAMINE (BENADRYL) 25 mg capsule, Take 1 capsule (25 mg total) by mouth every 6 (six) hours as needed. For mild urticaria, rash, or pruritis with ciprofloxacin use. Notify provider: [phone] used., Disp: 10 capsule, Rfl: 1   EPINEPHrine 0.3 mg/0.3 mL IJ SOAJ injection, SMARTSIG:0.3 Milligram(s) IM Once PRN, Disp: , Rfl:    famotidine (PEPCID) 20 MG tablet, Take 1 tablet (20 mg  total) by mouth daily as needed for heartburn or indigestion., Disp: 90 tablet, Rfl: 3   hydrALAZINE (APRESOLINE) 25 MG tablet, Take 25 mg by mouth 2 (two) times daily., Disp: , Rfl:    hydrochlorothiazide (HYDRODIURIL) 25 MG tablet, Take 0.5 tablets (12.5 mg total) by mouth daily., Disp: 45 tablet, Rfl: 1   losartan (COZAAR) 100 MG tablet, Take 100 mg by mouth daily., Disp: , Rfl:    pantoprazole (PROTONIX) 40 MG tablet, TAKE ONE TABLET BY MOUTH EVERY MORNING FOR GERD, Disp: 30 tablet, Rfl: 0   PARoxetine (PAXIL) 40 MG tablet, Take 40 mg by mouth daily., Disp: , Rfl:    Probiotic Product (PROBIOTIC ACIDOPHILUS BEADS) CAPS, Take 250 mg by mouth 2 (two) times daily., Disp: , Rfl:    Bacillus Coagulans-Inulin (PROBIOTIC FORMULA) 1-250 BILLION-MG CAPS, Take by mouth. (Patient not taking: Reported on 09/02/2020), Disp: , Rfl:    ciprofloxacin (CIPRO) 250 MG tablet, Take 1 tablet (250 mg total) by mouth 2 (two) times daily. For UTI (Patient not taking: Reported on 09/02/2020), Disp: 10 tablet, Rfl: 0  I spent 42 minutes dedicated to the care of this patient on the date of this encounter to include pre-visit  review of records, face-to-face time with the patient discussing conditions above, post visit ordering of testing, clinical documentation with the electronic health record, making appropriate referrals as documented, and communicating necessary findings to members of the patients care team.   Garner Nash, DO [facility] 09/02/2020 11:57 AM

## 2020-09-02 NOTE — Patient Instructions (Signed)
Thank you for visiting Dr. Valeta Harms at [facility]. Today we recommend the following:  CT Chest in 6 months  See me afterwards.  Trelegy 100 samples   Return in about 6 months (around 03/05/2021) for w/ Dr. Valeta Harms .    Please do your part to reduce the spread of COVID-19.

## 2020-09-02 NOTE — Patient Instructions (Addendum)
Full PFT performed today. °

## 2020-09-02 NOTE — Progress Notes (Signed)
Full PFT performed today. °

## 2020-09-13 ENCOUNTER — Ambulatory Visit (INDEPENDENT_AMBULATORY_CARE_PROVIDER_SITE_OTHER): Payer: Medicare HMO

## 2020-09-13 ENCOUNTER — Encounter (HOSPITAL_BASED_OUTPATIENT_CLINIC_OR_DEPARTMENT_OTHER): Payer: Self-pay | Admitting: Nurse Practitioner

## 2020-09-13 DIAGNOSIS — R55 Syncope and collapse: Secondary | ICD-10-CM

## 2020-09-13 LAB — CUP PACEART REMOTE DEVICE CHECK
Date Time Interrogation Session: 20220808005625
Implantable Pulse Generator Implant Date: 20191002

## 2020-09-14 ENCOUNTER — Telehealth (INDEPENDENT_AMBULATORY_CARE_PROVIDER_SITE_OTHER): Payer: Medicare HMO | Admitting: Nurse Practitioner

## 2020-09-14 ENCOUNTER — Encounter (HOSPITAL_BASED_OUTPATIENT_CLINIC_OR_DEPARTMENT_OTHER): Payer: Self-pay | Admitting: Nurse Practitioner

## 2020-09-14 ENCOUNTER — Other Ambulatory Visit: Payer: Self-pay

## 2020-09-14 VITALS — Ht 65.0 in | Wt 250.0 lb

## 2020-09-14 DIAGNOSIS — N3090 Cystitis, unspecified without hematuria: Secondary | ICD-10-CM | POA: Diagnosis not present

## 2020-09-14 MED ORDER — CIPROFLOXACIN HCL 250 MG PO TABS: 250.0000 mg | ORAL_TABLET | Freq: Two times a day (BID) | ORAL | 0 refills | Status: DC

## 2020-09-14 NOTE — Progress Notes (Signed)
Virtual Visit via Telephone Note  I connected with  [name] [name] on 09/14/20 at  3:50 PM EDT by telephone and verified that I am speaking with the correct person using two identifiers.   I discussed the limitations, risks, security and privacy concerns of performing an evaluation and management service by telephone and the availability of in person appointments. I also discussed with the patient that there may be a patient responsible charge related to this service. The patient expressed understanding and agreed to proceed.  Participating parties included in this telephone visit include: The patient and the nurse practitioner listed.  The patient is: At home I am: In the office  Subjective:    CC: UTI  HPI: [name] [name] is a 80 y.o. year old female presenting today via telephone visit to discuss frequent UTI. [name] [name] has been having UTI's about every 2 months for the past year. She reports that she has lower abdominal pain, increased frequency, and malodorous urine which prompts the living facility to test her.  We have treated with ciprofloxacin with relief several times, but she is concerned about the frequency.  She has a history of implanted device to help stimulate urination (unable to remember what it is called), but she does not feel that this is working for her.  She reports that she does have increased urination at night and wears a pull-up to bed.  She has also recently started using a bidet to help reduce the bacteria present, but this does not seem to have helped.  No fevers, chills, body aches, changes in mental status, or falls   Past medical history, Surgical history, Family history not pertinant except as noted below, Social history, Allergies, and medications have been entered into the medical record, reviewed, and corrections made.   Review of Systems:  All review of systems negative except what is listed in the HPI  Objective:    General:  Patient speaking  clearly in complete sentences. No shortness of breath noted.   Alert and oriented x3.   Normal judgment.  No apparent acute distress.  Impression and Recommendations:    1. Cystitis - ciprofloxacin (CIPRO) 250 MG tablet; Take 1 tablet (250 mg total) by mouth 2 (two) times daily. For UTI  Dispense: 10 tablet; Refill: 0 - Ambulatory referral to Urology  Repeat occurrence of cystitis- tested at assisted living facility. Awaiting culture results.  Given the frequency of infections and her significant allergies, recommend referral to urology for further evaluation and recommendations.  Discussed that night time urination in pull up may be contributing, but that is unclear that this time.  Given the unknown implanted device (suspect a nerve stimulator), I do have concerns that she may have another issue that we are not aware of.  Discussed referral and she is agreeable to [facility]- referral placed Treat with Cipro '250mg'$  BID x 5 days while awaiting culture results.  F/U if sx worsen or fail to improve.      I discussed the assessment and treatment plan with the patient. The patient was provided an opportunity to ask questions and all were answered. The patient agreed with the plan and demonstrated an understanding of the instructions.   The patient was advised to call back or seek an in-person evaluation if the symptoms worsen or if the condition fails to improve as anticipated.  I provided 20 minutes of non-face-to-face time during this TELEPHONE encounter.    Orma Render, NP

## 2020-09-28 ENCOUNTER — Telehealth (HOSPITAL_BASED_OUTPATIENT_CLINIC_OR_DEPARTMENT_OTHER): Payer: Self-pay

## 2020-09-28 NOTE — Telephone Encounter (Signed)
Thank you :)

## 2020-09-28 NOTE — Telephone Encounter (Signed)
Called patient to make her aware of the urology referral to [facility] in [place].  Instructed he to contact them if she has not heard from them in a couple days or so.

## 2020-10-04 NOTE — Telephone Encounter (Signed)
I called and spoke with the pt She states her breathing has not improved on trelegy  Not having any acute symptoms, just no better since the last visit in July 2022  OV with TP 10/18/20  Advised call sooner if needed or seem emergent care if symptoms worsen

## 2020-10-05 NOTE — Progress Notes (Signed)
[facility] Summary Report 

## 2020-10-18 ENCOUNTER — Ambulatory Visit (INDEPENDENT_AMBULATORY_CARE_PROVIDER_SITE_OTHER): Payer: Medicare HMO

## 2020-10-18 ENCOUNTER — Ambulatory Visit (INDEPENDENT_AMBULATORY_CARE_PROVIDER_SITE_OTHER): Payer: Medicare HMO | Admitting: Adult Health

## 2020-10-18 ENCOUNTER — Encounter: Payer: Self-pay | Admitting: Adult Health

## 2020-10-18 ENCOUNTER — Other Ambulatory Visit: Payer: Self-pay

## 2020-10-18 DIAGNOSIS — J449 Chronic obstructive pulmonary disease, unspecified: Secondary | ICD-10-CM | POA: Diagnosis not present

## 2020-10-18 DIAGNOSIS — R918 Other nonspecific abnormal finding of lung field: Secondary | ICD-10-CM | POA: Diagnosis not present

## 2020-10-18 DIAGNOSIS — R55 Syncope and collapse: Secondary | ICD-10-CM | POA: Diagnosis not present

## 2020-10-18 DIAGNOSIS — G4733 Obstructive sleep apnea (adult) (pediatric): Secondary | ICD-10-CM | POA: Diagnosis not present

## 2020-10-18 MED ORDER — TRELEGY ELLIPTA 100-62.5-25 MCG/INH IN AEPB
1.0000 | INHALATION_SPRAY | Freq: Every day | RESPIRATORY_TRACT | 3 refills | Status: DC
Start: 2020-10-18 — End: 2021-02-21

## 2020-10-18 NOTE — Patient Instructions (Signed)
Restart TRELEGY 1 puff daily , rinse after use.  Activity as tolerated. PT exercises as planned  CT as planned February 2023 .  Albuterol inhaler As needed   Continue on CPAP At bedtime   Bring SD card to next office visit.  Follow up with Dr. Valeta Harms in 2-3 months and As needed   Please contact office for sooner follow up if symptoms do not improve or worsen or seek emergency care

## 2020-10-18 NOTE — Progress Notes (Signed)
$'@Patient'f$  ID: [name] [name], female    DOB: 1940/11/24, 80 y.o.   MRN: [id number]  Chief Complaint  Patient presents with   Follow-up    Referring provider: Orma Render, NP  HPI: 80 year old female former smoker seen for pulmonary consult July 13, 2020 for shortness of breath, establish for sleep apnea found to have lung nodule on CT scan  TEST/EVENTS :  CT chest July 23, 2020 7 mm right lower lobe pulmonary nodule previously 4 mm appears larger and now slightly spiculated.  Additional 2 mm right lower lobe nodule unchanged, minimal atelectasis left lower lobe.  PET scan August 19, 2020 shows no hypermetabolism in the 7 mm right lower lobe nodule.  This is below size threshold for allowable resolution.  No other suspicious or unexpected hypermetabolic activity is noted.  Myoview stress test April 20 and 2022 showed EF at 61%, normal study, low risk no ischemia noted.  10/18/2020 Follow up : COPD , Lung nodule, OSA  Patient returns for a 31-monthfollow-up.  Patient has known underlying COPD.  She was given Trelegy last visit.  Patient says she is unsure if this really helped or not she only took the sample for about 2 weeks.  She says she does get short of breath with activities.  Has some minimum cough.  She denies any fever chest pain orthopnea PND or increased leg swelling. PFTs September 02, 2020 showed moderate restriction with an FEV1 at 75%, ratio 84, FVC 66%, no significant bronchodilator response, DLCO 74%. Patient noted to have a lung nodule on CT scan.  CT on July 23, 2020 showed a 7 mm right lower lobe pulmonary nodule which was previously 4 mm.  And slightly spiculated.  An additional 2 mm right lower lobe nodule.  Patient was set up for a PET scan this was completed on August 19, 2020  showed no hypermetabolic activity.  Patient's been set up for a CT chest in 6 months around February 2023. Patient says she tries to be active.  She does physical therapy at her assisted living. She  is on CPAP for sleep apnea.  Patient says she wears her CPAP each night.  She says she has an ST card and will bring into the office at next visit.   Allergies  Allergen Reactions   Azithromycin Anaphylaxis and Hives   Bee Venom Anaphylaxis   Erythromycin Base Anaphylaxis   Metronidazole Anaphylaxis   Nitrofurantoin Macrocrystal Anaphylaxis   Penicillins Anaphylaxis and Hives    childhood   Sulfa Antibiotics Anaphylaxis and Hives    Childhood   Cymbalta [Duloxetine Hcl]    Ivp Dye [Iodinated Diagnostic Agents]    Penicillin G Hives   Codeine Rash   Erythromycin Rash and Hives   Levofloxacin Rash   Nitrofurantoin Hives   Percocet [Oxycodone-Acetaminophen] Rash    Immunization History  Administered Date(s) Administered   Influenza, High Dose Seasonal PF 11/07/2019   Moderna Sars-Covid-2 Vaccination 05/05/2019, 12/18/2019   Pneumococcal Conjugate-13 11/07/2018   Pneumococcal Polysaccharide-23 02/07/2012    Past Medical History:  Diagnosis Date   Allergy    Anxiety    Arthritis    Blood transfusion without reported diagnosis    Cancer ([place]    Melanoma on Great toe   Cataract    COPD (chronic obstructive pulmonary disease) (HCC)    Depression    GERD (gastroesophageal reflux disease)    Hyperlipidemia    Hypertension    Osteoporosis    Sleep apnea  Tobacco History: Social History   Tobacco Use  Smoking Status Former   Types: Cigarettes   Start date: 1957   Quit date: 1988   Years since quitting: 34.7  Smokeless Tobacco Never   Counseling given: Not Answered   Outpatient Medications Prior to Visit  Medication Sig Dispense Refill   albuterol (VENTOLIN HFA) 108 (90 Base) MCG/ACT inhaler Inhale 1-2 puffs into the lungs every 6 (six) hours as needed for wheezing or shortness of breath.     ALPRAZolam (XANAX) 0.25 MG tablet alprazolam 0.25 mg tablet  2 times a day as needed: 1 tab(s) 0.25 mg     amLODipine (NORVASC) 5 MG tablet Take 5 mg by mouth daily.      aspirin EC 81 MG tablet Take 81 mg by mouth daily. Swallow whole.     atorvastatin (LIPITOR) 10 MG tablet Take 10 mg by mouth daily.     Bacillus Coagulans-Inulin (PROBIOTIC FORMULA) 1-250 BILLION-MG CAPS Take by mouth.     camphor-menthol (SARNA) lotion Apply 1 application topically as needed for itching. 222 mL 0   cetirizine (ZYRTEC) 10 MG tablet Take 10 mg by mouth daily.     diphenhydrAMINE (BENADRYL) 25 mg capsule Take 1 capsule (25 mg total) by mouth every 6 (six) hours as needed. For mild urticaria, rash, or pruritis with ciprofloxacin use. Notify provider: [phone] if used. 10 capsule 1   EPINEPHrine 0.3 mg/0.3 mL IJ SOAJ injection SMARTSIG:0.3 Milligram(s) IM Once PRN     famotidine (PEPCID) 20 MG tablet Take 1 tablet (20 mg total) by mouth daily as needed for heartburn or indigestion. 90 tablet 3   Fluticasone-Umeclidin-Vilant (TRELEGY ELLIPTA) 100-62.5-25 MCG/INH AEPB Inhale 1 puff into the lungs daily. 1 each 0   hydrALAZINE (APRESOLINE) 25 MG tablet Take 25 mg by mouth 2 (two) times daily.     hydrochlorothiazide (HYDRODIURIL) 25 MG tablet Take 0.5 tablets (12.5 mg total) by mouth daily. 45 tablet 1   losartan (COZAAR) 100 MG tablet Take 100 mg by mouth daily.     pantoprazole (PROTONIX) 40 MG tablet TAKE ONE TABLET BY MOUTH EVERY MORNING FOR GERD 30 tablet 0   PARoxetine (PAXIL) 40 MG tablet Take 40 mg by mouth daily.     Probiotic Product (PROBIOTIC ACIDOPHILUS BEADS) CAPS Take 250 mg by mouth 2 (two) times daily.     ciprofloxacin (CIPRO) 250 MG tablet Take 1 tablet (250 mg total) by mouth 2 (two) times daily. For UTI (Patient not taking: Reported on 10/18/2020) 10 tablet 0   No facility-administered medications prior to visit.     Review of Systems:   Constitutional:   No  weight loss, night sweats,  Fevers, chills,  +fatigue, or  lassitude.  HEENT:   No headaches,  Difficulty swallowing,  Tooth/dental problems, or  Sore throat,                No sneezing, itching,  ear ache, nasal congestion, post nasal drip,   CV:  No chest pain,  Orthopnea, PND, swelling in lower extremities, anasarca, dizziness, palpitations, syncope.   GI  No heartburn, indigestion, abdominal pain, nausea, vomiting, diarrhea, change in bowel habits, loss of appetite, bloody stools.   Resp: .  No excess mucus, no productive cough,  No non-productive cough,  No coughing up of blood.  No change in color of mucus.  No wheezing.  No chest wall deformity  Skin: no rash or lesions.  GU: no dysuria, change in color of urine, no  urgency or frequency.  No flank pain, no hematuria   MS:  No joint pain or swelling.  No decreased range of motion.  No back pain.    Physical Exam  BP 128/70 (BP Location: Left Arm, Patient Position: Sitting, Cuff Size: Large)   Pulse (!) 110   Temp 99.5 F (37.5 C) (Oral)   Ht '5\' 5"'$  (1.651 m)   Wt 254 lb 3.2 oz (115.3 kg)   SpO2 94%   BMI 42.30 kg/m   GEN: A/Ox3; pleasant , NAD, BMI 42   HEENT:  [place]/AT,  NOSE-clear, THROAT-clear, no lesions, no postnasal drip or exudate noted.   NECK:  Supple w/ fair ROM; no JVD; normal carotid impulses w/o bruits; no thyromegaly or nodules palpated; no lymphadenopathy.    RESP  Clear  P & A; w/o, wheezes/ rales/ or rhonchi. no accessory muscle use, no dullness to percussion  CARD:  RRR, no m/r/g, no peripheral edema, pulses intact, no cyanosis or clubbing.  GI:   Soft & nt; nml bowel sounds; no organomegaly or masses detected.   Musco: Warm bil, no deformities or joint swelling noted.   Neuro: alert, no focal deficits noted.    Skin: Warm, no lesions or rashes    Lab Results:  CBC    Component Value Date/Time   WBC 9.3 12/29/2019 1434   RBC 4.25 12/29/2019 1434   HGB 11.9 12/29/2019 1434   HCT 36.4 12/29/2019 1434   PLT 312 12/29/2019 1434   MCV 86 12/29/2019 1434   MCH 28.0 12/29/2019 1434   MCHC 32.7 12/29/2019 1434   RDW 14.5 12/29/2019 1434    BMET    Component Value Date/Time   NA 138  12/29/2019 1434   K 3.8 12/29/2019 1434   CL 101 12/29/2019 1434   CO2 21 12/29/2019 1434   GLUCOSE 138 (H) 12/29/2019 1434   BUN 15 12/29/2019 1434   CREATININE 0.91 12/29/2019 1434   CALCIUM 9.6 12/29/2019 1434   GFRNONAA 60 12/29/2019 1434   GFRAA 69 12/29/2019 1434    BNP No results found for: BNP  ProBNP No results found for: PROBNP  Imaging: No results found.    PFT Results Latest Ref Rng & Units 09/02/2020  FVC-Pre L 1.73  FVC-Predicted Pre % 63  FVC-Post L 1.81  FVC-Predicted Post % 66  Pre FEV1/FVC % % 80  Post FEV1/FCV % % 84  FEV1-Pre L 1.39  FEV1-Predicted Pre % 68  FEV1-Post L 1.52  DLCO uncorrected ml/min/mmHg 14.28  DLCO UNC% % 74  DLCO corrected ml/min/mmHg 14.28  DLCO COR %Predicted % 74  DLVA Predicted % 97  TLC L 5.24  TLC % Predicted % 102  RV % Predicted % 131    No results found for: NITRICOXIDE      Assessment & Plan:   No problem-specific Assessment & Plan notes found for this encounter.     Rexene Edison, NP 10/18/2020

## 2020-10-19 LAB — CUP PACEART REMOTE DEVICE CHECK
Date Time Interrogation Session: 20220910005415
Implantable Pulse Generator Implant Date: 20191002

## 2020-10-21 DIAGNOSIS — R918 Other nonspecific abnormal finding of lung field: Secondary | ICD-10-CM | POA: Insufficient documentation

## 2020-10-21 NOTE — Assessment & Plan Note (Signed)
Continue on CPAP at bedtime.  Bring SD card to next office visit for CPAP download  Plan  Patient Instructions  Restart TRELEGY 1 puff daily , rinse after use.  Activity as tolerated. PT exercises as planned  CT as planned February 2023 .  Albuterol inhaler As needed   Continue on CPAP At bedtime   Bring SD card to next office visit.  Follow up with Dr. Valeta Harms in 2-3 months and As needed   Please contact office for sooner follow up if symptoms do not improve or worsen or seek emergency care

## 2020-10-21 NOTE — Assessment & Plan Note (Signed)
COPD with restrictive changes on CT.  Patient is recommended to continue on Trelegy.  Activity as tolerated.  Plan  Patient Instructions  Restart TRELEGY 1 puff daily , rinse after use.  Activity as tolerated. PT exercises as planned  CT as planned February 2023 .  Albuterol inhaler As needed   Continue on CPAP At bedtime   Bring SD card to next office visit.  Follow up with Dr. Valeta Harms in 2-3 months and As needed   Please contact office for sooner follow up if symptoms do not improve or worsen or seek emergency care

## 2020-10-21 NOTE — Assessment & Plan Note (Signed)
Scattered pulmonary nodules with maximum size 7 mm.  PET scan showed no hypermetabolic activity however this is below the size per threshold.  We discussed her PET scan results.  And will repeat CT chest in 6 months.  Plan  . Patient Instructions  Restart TRELEGY 1 puff daily , rinse after use.  Activity as tolerated. PT exercises as planned  CT as planned February 2023 .  Albuterol inhaler As needed   Continue on CPAP At bedtime   Bring SD card to next office visit.  Follow up with Dr. Valeta Harms in 2-3 months and As needed   Please contact office for sooner follow up if symptoms do not improve or worsen or seek emergency care

## 2020-10-22 NOTE — Progress Notes (Signed)
Carelink Summary Report / Loop Recorder 

## 2020-10-28 ENCOUNTER — Other Ambulatory Visit (HOSPITAL_BASED_OUTPATIENT_CLINIC_OR_DEPARTMENT_OTHER): Payer: Self-pay | Admitting: Nurse Practitioner

## 2020-10-28 ENCOUNTER — Encounter (HOSPITAL_BASED_OUTPATIENT_CLINIC_OR_DEPARTMENT_OTHER): Payer: Self-pay | Admitting: Nurse Practitioner

## 2020-10-28 DIAGNOSIS — N3090 Cystitis, unspecified without hematuria: Secondary | ICD-10-CM

## 2020-10-28 MED ORDER — CIPROFLOXACIN HCL 250 MG PO TABS: 250.0000 mg | ORAL_TABLET | Freq: Two times a day (BID) | ORAL | 0 refills | Status: DC

## 2020-10-31 ENCOUNTER — Encounter (HOSPITAL_BASED_OUTPATIENT_CLINIC_OR_DEPARTMENT_OTHER): Payer: Self-pay | Admitting: Nurse Practitioner

## 2020-11-02 ENCOUNTER — Encounter (HOSPITAL_BASED_OUTPATIENT_CLINIC_OR_DEPARTMENT_OTHER): Payer: Self-pay | Admitting: Nurse Practitioner

## 2020-11-04 ENCOUNTER — Telehealth (INDEPENDENT_AMBULATORY_CARE_PROVIDER_SITE_OTHER): Payer: Medicare HMO | Admitting: Nurse Practitioner

## 2020-11-04 ENCOUNTER — Other Ambulatory Visit: Payer: Self-pay

## 2020-11-04 ENCOUNTER — Telehealth (HOSPITAL_BASED_OUTPATIENT_CLINIC_OR_DEPARTMENT_OTHER): Payer: Self-pay

## 2020-11-04 ENCOUNTER — Encounter (HOSPITAL_BASED_OUTPATIENT_CLINIC_OR_DEPARTMENT_OTHER): Payer: Self-pay | Admitting: Nurse Practitioner

## 2020-11-04 VITALS — Ht 66.0 in | Wt 254.0 lb

## 2020-11-04 DIAGNOSIS — F321 Major depressive disorder, single episode, moderate: Secondary | ICD-10-CM

## 2020-11-04 DIAGNOSIS — F411 Generalized anxiety disorder: Secondary | ICD-10-CM

## 2020-11-04 MED ORDER — MIRTAZAPINE 7.5 MG PO TABS
7.5000 mg | ORAL_TABLET | Freq: Every day | ORAL | 1 refills | Status: DC
Start: 2020-11-04 — End: 2020-11-11

## 2020-11-04 NOTE — Progress Notes (Signed)
Virtual Visit Encounter mychart visit. Converted to Telephone visit.    I connected with  [name] [name] on 11/04/20 at  1:10 PM EDT by secure audio and/or video enabled telemedicine application. I verified that I am speaking with the correct person using two identifiers.   I introduced myself as a Designer, jewellery with the practice. The limitations of evaluation and management by telemedicine discussed with the patient and the availability of in person appointments. The patient expressed verbal understanding and consent to proceed.  Participating parties in this visit include: Myself and patient  The patient is: Patient Location: Home I am: Provider Location: Office/Clinic Subjective:    CC and HPI: [name] [name] is a 80 y.o. year old female presenting for worsening symptoms of depression over the past week.  [name] [name] endorses increased anxiety and depression. She  She is currently taking Paxil 40mg  daily. She has been on this medication and dose for many years. She reports she has been taking xanax 0.25mg  , which has been prescribed by the psychiatrist that comes to the facility where she lives. Historically she was taking this in the morning at 11 am and again at 4 pm. She reports that now she is only getting the medication every 12 hours. She tells me that since the change in dosing times (about a week ago) she is easily irritable, can't get her focus together, very frustrated, and anxious, and she doesn't want to leave her room.  She has tried other medications in the past, but she cannot remember what they were called.   PHQ9 SCORE ONLY 11/04/2020 07/01/2020 06/03/2020  PHQ-9 Total Score 13 2 4      GAD 7 : Generalized Anxiety Score 11/04/2020 06/03/2020  Nervous, Anxious, on Edge 3 1  Control/stop worrying 0 0  Worry too much - different things 0 0  Trouble relaxing 3 0  Restless 0 0  Easily annoyed or irritable 3 1  Afraid - awful might happen 0 0  Total GAD 7 Score 9 2   Anxiety Difficulty Very difficult Not difficult at all    Past medical history, Surgical history, Family history not pertinant except as noted below, Social history, Allergies, and medications have been entered into the medical record, reviewed, and corrections made.   Review of Systems:  All review of systems negative except what is listed in the HPI  Objective:    Alert and oriented x 4 Speaking in clear sentences with no shortness of breath. No distress.  Impression and Recommendations:    Problem List Items Addressed This Visit     Generalized anxiety disorder    Significant worsening of anxiety symptoms after recent change in xanax dosing from 11 am and 4 pm to every 12 hours.  Symptoms consistent with physical dependence on medication.  Discussed with patient the possibility of physical dependence based on the symptoms she is experiencing.  Recommend that she reach out to the psychiatrist to discuss if medication dosing can be changed.  She requests referral to psychiatry within [facility]- I have placed that today.  Rather than attempt to taper paxil at this time, as she has been on this medication long term and she was previously stable, will add mirtazapine 7.5mg  nightly to see if this will be helpful in her symptoms.   Follow-up in 1 week with virtual visit for mood.       Relevant Medications   mirtazapine (REMERON) 7.5 MG tablet   Other Relevant Orders   Ambulatory referral to Psychiatry  Depression, major, single episode, moderate (HCC) - Primary    Exacerbation of depression and anxiety symptoms over past week following xanax dosing adjustment from 11am and 4pm to every 12 hours.  Symptoms consistent with dependence. No alarm symptoms present today and no thoughts of self harm. Recommend that she speak to the psychiatrist who prescribes this medication to adjust the dosing or provide alternative options if decreased dosing is desired.  Rather than taper off of paxil  and possibly trigger worsening of symptoms at this time, discussed the option of adding mirtazapine 7.5mg  at bedtime to help with depression and anxiety symptoms.  Patient agreeable to this plan.  She reported feeling better after speaking with me.  Will send referral for psychiatry in [facility] at patient request.  Follow-up in 7 days with virtual visit for mood.       Relevant Medications   mirtazapine (REMERON) 7.5 MG tablet   Other Relevant Orders   Ambulatory referral to Psychiatry    orders and follow up as documented in EMR I discussed the assessment and treatment plan with the patient. The patient was provided an opportunity to ask questions and all were answered. The patient agreed with the plan and demonstrated an understanding of the instructions.   The patient was advised to call back or seek an in-person evaluation if the symptoms worsen or if the condition fails to improve as anticipated.  Follow-Up: in a week  I provided 26 minutes of non-face-to-face interaction with this non face-to-face encounter including intake, same-day documentation, and chart review.   Orma Render, NP , DNP, AGNP-c [place] at [facility] [phone] (fax)

## 2020-11-04 NOTE — Patient Instructions (Addendum)
Continue Paxil (paroxetine) 40mg  every day We will also start Remeron (mirtazapine) 7.5mg  to be taken every night at bedtime.  I would like to have a visit with you in one week to see how you are doing on this medication.  We will plan to have you come in to have labs in 2 months after that.

## 2020-11-04 NOTE — Assessment & Plan Note (Signed)
Significant worsening of anxiety symptoms after recent change in xanax dosing from 11 am and 4 pm to every 12 hours.  Symptoms consistent with physical dependence on medication.  Discussed with patient the possibility of physical dependence based on the symptoms she is experiencing.  Recommend that she reach out to the psychiatrist to discuss if medication dosing can be changed.  She requests referral to psychiatry within [facility]- I have placed that today.  Rather than attempt to taper paxil at this time, as she has been on this medication long term and she was previously stable, will add mirtazapine 7.5mg  nightly to see if this will be helpful in her symptoms.   Follow-up in 1 week with virtual visit for mood.

## 2020-11-04 NOTE — Telephone Encounter (Signed)
Left message for patient to call back for appointment check-in.

## 2020-11-04 NOTE — Assessment & Plan Note (Signed)
Exacerbation of depression and anxiety symptoms over past week following xanax dosing adjustment from 11am and 4pm to every 12 hours.  Symptoms consistent with dependence. No alarm symptoms present today and no thoughts of self harm. Recommend that she speak to the psychiatrist who prescribes this medication to adjust the dosing or provide alternative options if decreased dosing is desired.  Rather than taper off of paxil and possibly trigger worsening of symptoms at this time, discussed the option of adding mirtazapine 7.5mg  at bedtime to help with depression and anxiety symptoms.  Patient agreeable to this plan.  She reported feeling better after speaking with me.  Will send referral for psychiatry in [facility] at patient request.  Follow-up in 7 days with virtual visit for mood.

## 2020-11-09 ENCOUNTER — Telehealth (HOSPITAL_BASED_OUTPATIENT_CLINIC_OR_DEPARTMENT_OTHER): Payer: Self-pay

## 2020-11-09 NOTE — Telephone Encounter (Signed)
Patient called reporting she has diarrhea every time she eat.  She then takes imodium and becomes constipated.  She wants to know what she can take for the diarhhea that will not constipate her.

## 2020-11-10 ENCOUNTER — Telehealth (HOSPITAL_BASED_OUTPATIENT_CLINIC_OR_DEPARTMENT_OTHER): Payer: Self-pay

## 2020-11-10 NOTE — Telephone Encounter (Signed)
Called patient to give her recommendations for diarrhea and constipation.  Patient is aware and agreeable to recommendations.

## 2020-11-11 ENCOUNTER — Ambulatory Visit (INDEPENDENT_AMBULATORY_CARE_PROVIDER_SITE_OTHER): Payer: Medicare HMO | Admitting: Nurse Practitioner

## 2020-11-11 ENCOUNTER — Encounter (HOSPITAL_BASED_OUTPATIENT_CLINIC_OR_DEPARTMENT_OTHER): Payer: Self-pay | Admitting: Nurse Practitioner

## 2020-11-11 DIAGNOSIS — F411 Generalized anxiety disorder: Secondary | ICD-10-CM | POA: Diagnosis not present

## 2020-11-11 DIAGNOSIS — E739 Lactose intolerance, unspecified: Secondary | ICD-10-CM

## 2020-11-11 DIAGNOSIS — F321 Major depressive disorder, single episode, moderate: Secondary | ICD-10-CM

## 2020-11-11 MED ORDER — MIRTAZAPINE 7.5 MG PO TABS: 7.5000 mg | ORAL_TABLET | Freq: Every day | ORAL | 2 refills | Status: DC

## 2020-11-11 MED ORDER — LACTASE 9000 UNITS PO TABS: ORAL_TABLET | ORAL | 11 refills | Status: DC

## 2020-11-11 NOTE — Assessment & Plan Note (Addendum)
Significant improvement of mood since starting remeron at night.  Sleep has improved and she is back to her usual activities.  I do feel that psychiatry evaluation would be helpful to manage her medications, especially her xanax as she has long term history of daily dosing. Given her age, I feel that frequent monitoring would be most beneficial. Referral placed at last visit.  We will follow-up on this. I am very pleased with the change. She has no reported side effects or concerns.  Will continue current dose of remeron.  Plan to follow-up with psychiatry more with me in 4 to 6 weeks.

## 2020-11-11 NOTE — Progress Notes (Signed)
Virtual Visit Encounter telephone visit.   I connected with  [name] [name] on 11/11/20 at  1:10 PM EDT by secure audio and/or video enabled telemedicine application. I verified that I am speaking with the correct person using two identifiers.   I introduced myself as a Designer, jewellery with the practice. The limitations of evaluation and management by telemedicine discussed with the patient and the availability of in person appointments. The patient expressed verbal understanding and consent to proceed.  Participating parties in this visit include: Myself and patient  The patient is: Patient Location: Home I am: Provider Location: Office/Clinic Subjective:    CC and HPI: [name] [name] is a 80 y.o. year old female presenting for follow up of depression.  [name] [name] reports that since starting remeron she has been feeling much better. She reports she is sleeping well, her mood is stable, she has started enjoying being with her friends and activities in the community, and she is not "as moody" as she was prior to starting the medication.  She is turning 80 on Monday and her family is planning a birthday celebration for her and she is very excited about that and getting together with family.  She would like to see a psychiatrist outside of the facility.   She also reports concerns with diarrhea and stomach cramping when eating any foods with dairy in them. She tells me this has been going on for about 5 years and it is "annoying". She would like to know if there is anything that can be done about this.   Past medical history, Surgical history, Family history not pertinant except as noted below, Social history, Allergies, and medications have been entered into the medical record, reviewed, and corrections made.   Review of Systems:  All review of systems negative except what is listed in the HPI  Objective:    Alert and oriented x 4 Speaking in clear sentences with no shortness of  breath. She sounds much happier today.  Her mood is definitely lighter and higher than it was at the last visit. No distress.  Impression and Recommendations:    Problem List Items Addressed This Visit     Depression, major, single episode, moderate ([place]) - Primary    Significant improvement of mood since starting remeron at night.  Sleep has improved and she is back to her usual activities.  I do feel that psychiatry evaluation would be helpful to manage her medications, especially her xanax as she has long term history of daily dosing. Given her age, I feel that frequent monitoring would be most beneficial. Referral placed at last visit.  We will follow-up on this. I am very pleased with the change. She has no reported side effects or concerns.  Will continue current dose of remeron.  Plan to follow-up with psychiatry more with me in 4 to 6 weeks.      Relevant Medications   mirtazapine (REMERON) 7.5 MG tablet   Lactose intolerance    Symptoms consistent with lactose intolerance. Discussed with patient option to start Lactaid with first bite of meals containing dairy products to see if this is helpful to reduce her symptoms. She is agreeable to try this. Discussed with patient if this is not effective we will consider a referral to GI for further evaluation. She has no red flag warnings present today, therefore conservative treatment was appropriate. She will follow-up as needed.      Relevant Medications   Lactase 9000 units TABS   Generalized  anxiety disorder   Relevant Medications   mirtazapine (REMERON) 7.5 MG tablet     I discussed the assessment and treatment plan with the patient. The patient was provided an opportunity to ask questions and all were answered. The patient agreed with the plan and demonstrated an understanding of the instructions.   The patient was advised to call back or seek an in-person evaluation if the symptoms worsen or if the condition fails to  improve as anticipated.  Follow-Up: 4 weeks with me or psychiatry  I provided 20 minutes of non-face-to-face interaction with this non face-to-face encounter including intake, same-day documentation, and chart review.   [name] Render, NP , DNP, AGNP-c [place] at [facility] [phone] (fax)

## 2020-11-11 NOTE — Assessment & Plan Note (Signed)
Symptoms consistent with lactose intolerance. Discussed with patient option to start Lactaid with first bite of meals containing dairy products to see if this is helpful to reduce her symptoms. She is agreeable to try this. Discussed with patient if this is not effective we will consider a referral to GI for further evaluation. She has no red flag warnings present today, therefore conservative treatment was appropriate. She will follow-up as needed.

## 2020-11-11 NOTE — Patient Instructions (Signed)
Recommendations from today's visit: I sent the Lactaid to the pharmacy for you. You will take 1 pill with the first bite a meal with foods that contain dairy in them. If this is not helpful for your symptoms after you have tried a few times please let me know and we will send a referral to the GI doctor for you. We will check on the referral to psychiatry.  It was placed last time but since you have not heard anything I want to make sure there are no hiccups with that. I hope that you have an amazing birthday!!  Turning 68 is a really big deal! I am so happy that you are feeling better!

## 2020-11-22 ENCOUNTER — Ambulatory Visit (INDEPENDENT_AMBULATORY_CARE_PROVIDER_SITE_OTHER): Payer: Medicare HMO

## 2020-11-22 DIAGNOSIS — R55 Syncope and collapse: Secondary | ICD-10-CM

## 2020-11-23 ENCOUNTER — Encounter (HOSPITAL_BASED_OUTPATIENT_CLINIC_OR_DEPARTMENT_OTHER): Payer: Self-pay | Admitting: Nurse Practitioner

## 2020-11-23 LAB — CUP PACEART REMOTE DEVICE CHECK
Date Time Interrogation Session: 20221013005442
Implantable Pulse Generator Implant Date: 20191002

## 2020-11-24 ENCOUNTER — Telehealth (HOSPITAL_BASED_OUTPATIENT_CLINIC_OR_DEPARTMENT_OTHER): Payer: Self-pay | Admitting: Nurse Practitioner

## 2020-11-24 NOTE — Telephone Encounter (Signed)
[name] [name] sent a message regarding worsening anxiety symptoms.   Please call her and see if she is still seeing a psychiatrist in the facility. If not, we can send a referral for her or she can go to the behavioral health urgent care to see someone immediately.  If she is still seeing the psychiatrist, I strongly recommend that she contact them to speak with them about her worsening symptoms. Unfortunately, we have reached the threshold of what I can manage in primary care at this point. It is beyond my expertise to add additional medications and I cannot prescribe chronic benzodiazepines for her.  I do not have any information on what psychiatry has done other than what she has mentioned to me.

## 2020-11-25 ENCOUNTER — Telehealth (HOSPITAL_BASED_OUTPATIENT_CLINIC_OR_DEPARTMENT_OTHER): Payer: Self-pay

## 2020-11-25 DIAGNOSIS — F321 Major depressive disorder, single episode, moderate: Secondary | ICD-10-CM

## 2020-11-25 DIAGNOSIS — F411 Generalized anxiety disorder: Secondary | ICD-10-CM

## 2020-11-25 NOTE — Telephone Encounter (Signed)
Left message for patient to call back  

## 2020-11-25 NOTE — Telephone Encounter (Signed)
Spoke with patient regarding psychiatry referral.  Patient states there is not a need to see someone immediately.  Psychiatry referral order placed.

## 2020-11-25 NOTE — Addendum Note (Signed)
Addended by: Virginia Crews D on: 11/25/2020 11:11 AM   Modules accepted: Orders

## 2020-11-30 NOTE — Progress Notes (Signed)
Carelink Summary Report / Loop Recorder 

## 2020-12-02 ENCOUNTER — Telehealth (HOSPITAL_BASED_OUTPATIENT_CLINIC_OR_DEPARTMENT_OTHER): Payer: Self-pay

## 2020-12-02 ENCOUNTER — Other Ambulatory Visit (HOSPITAL_BASED_OUTPATIENT_CLINIC_OR_DEPARTMENT_OTHER): Payer: Self-pay | Admitting: Nurse Practitioner

## 2020-12-02 NOTE — Telephone Encounter (Signed)
Patient left a voicemail reporting she had indigestion.  Called patient back to discuss.  She reports receiving medication for indigestion from the assisted living facility she lives at and it works.

## 2020-12-08 ENCOUNTER — Telehealth (HOSPITAL_BASED_OUTPATIENT_CLINIC_OR_DEPARTMENT_OTHER): Payer: Self-pay

## 2020-12-08 ENCOUNTER — Telehealth: Payer: Self-pay | Admitting: Pulmonary Disease

## 2020-12-08 NOTE — Telephone Encounter (Signed)
Received a call from [facility] this morning - Senita (Nurse) Patient complained to them this morning that she is having issues with her right eye Patient states that the eye is itchy, swollen and has a lot of pressure in it Senita states the patient informed them of the issues this morning, although the patient has been experiencing the pain for 2-3 days. Patient stated "I thought the swelling would go down on its own" Advise Senita I would route to Worthy Keeler, DNP for advisement She is agreeable

## 2020-12-08 NOTE — Telephone Encounter (Signed)
Specialty pharmacy team is not able to handle requests for inhalers at this time, however patient can be enrolled in patient assistance program for Trelegy at [email or web address]  Recommend reaching out to patient during enrollment as household size and income information will be required.

## 2020-12-08 NOTE — Telephone Encounter (Signed)
Will see if the pharmacy can do a check on an inhaler that will be more cost effective for the pt.  thanks

## 2020-12-08 NOTE — Telephone Encounter (Signed)
Contacted [facility] to have a video visit arranged for the patient per the recommendation of the provider

## 2020-12-09 ENCOUNTER — Other Ambulatory Visit: Payer: Self-pay

## 2020-12-09 ENCOUNTER — Telehealth (INDEPENDENT_AMBULATORY_CARE_PROVIDER_SITE_OTHER): Payer: Medicare HMO | Admitting: Nurse Practitioner

## 2020-12-09 ENCOUNTER — Other Ambulatory Visit (HOSPITAL_COMMUNITY): Payer: Self-pay

## 2020-12-09 ENCOUNTER — Encounter (HOSPITAL_BASED_OUTPATIENT_CLINIC_OR_DEPARTMENT_OTHER): Payer: Self-pay | Admitting: Nurse Practitioner

## 2020-12-09 DIAGNOSIS — F411 Generalized anxiety disorder: Secondary | ICD-10-CM

## 2020-12-09 DIAGNOSIS — H5789 Other specified disorders of eye and adnexa: Secondary | ICD-10-CM

## 2020-12-09 HISTORY — DX: Other specified disorders of eye and adnexa: H57.89

## 2020-12-09 MED ORDER — CIPROFLOXACIN-DEXAMETHASONE 0.3-0.1 % OT SUSP: OTIC | 0 refills | Status: DC

## 2020-12-09 MED ORDER — ALPRAZOLAM 0.25 MG PO TABS: 0.2500 mg | ORAL_TABLET | Freq: Two times a day (BID) | ORAL | 0 refills | Status: DC | PRN

## 2020-12-09 NOTE — Patient Instructions (Addendum)
I want you to use warm compresses on the eye three to four times a day using a warm washcloth.   I have sent the prescription for the eye drops to be used three times a day in the right eye for 3-5 days until the symptoms resolve.   I have sent in a refill for your xanax until you can get in with behavioral health for further evaluation.

## 2020-12-09 NOTE — Assessment & Plan Note (Signed)
I do feel she would greatly benefit from psychiatry services given her uncontrolled anxiety symptoms.  We discussed that we are unable to provide long term benzodiazepines from this practice, but I will be happy to re-write her original prescription so that she can get back on her usual dosing given the severity of symptoms she is experiencing.  I will defer to psychiatry for recommendations on maintaining this medication.  Discussion of the risks of benzodiazepines, however, this patient does seem to greatly benefit from their effectiveness and is on maintenance medications for her mood.  Age needs to be considered, however, the long term use of the medication is also considered in this decision and has been deemed as a safe option for her at this time.  Will take lead from psychiatry for recommendations for further medication management once she is established.  She will let me know if her symptoms do not improve with the change in dosing schedule.

## 2020-12-09 NOTE — Progress Notes (Signed)
Virtual Visit Encounter  telephone visit. Video connection established, but disconnected and unable to reconnect.    I connected with  [name] on 12/09/20 at  9:50 AM EDT by secure audio and/or video enabled telemedicine application. I verified that I am speaking with the correct person using two identifiers.   I introduced myself as a Designer, jewellery with the practice. The limitations of evaluation and management by telemedicine discussed with the patient and the availability of in person appointments. The patient expressed verbal understanding and consent to proceed.  Participating parties in this visit include: Myself and patient  The patient is: Patient Location: Home I am: Provider Location: Office/Clinic Subjective:    CC and HPI: [name] [name] is a 80 y.o. year old female presenting for new evaluation and treatment of swelling in right eye. She reports it has been present for about 3 days and seems to have stabalized, but is still swollen and irrtitated, primarily at the inner canthus. She endorses some itching and mild clear drainage. She has not been exposed to any allergens that she is aware of. She reports it feels like there is an irritation under the top eyelid. She is unable to pull the eyelid up. She has no vision changes, no sinus symptoms, no symptoms in the left eye.   She also expresses concerns over ongoing anxiety symptoms. She has historically been on Xanax 0.25mg  BID for years. Recently she was seen by an inhouse psychiatrist in the facility where she lives who changed the prescription to one every 12 hours. She reports in the past she has taken this medication in the morning and again around 4-6pm. Since this change, she has only been taking one tablet per day but reports her afternoon anxiety symptoms are out of control. She endorses severe symptoms of agitation and anxiety that lead to worsening depressive symptoms. She has stopped being involved in evening  activities due to the changes in her mood in the afternoon. She tried to contact psychiatry that changed her dosage, but they are no longer working with the facility.  She is interested in seeing psychiatry for management of this, but would like to know if I can change the medication back to the original dosing until she can be established.   Past medical history, Surgical history, Family history not pertinant except as noted below, Social history, Allergies, and medications have been entered into the medical record, reviewed, and corrections made.   Review of Systems:  All review of systems negative except what is listed in the HPI  Objective:    Alert and oriented x 4 Speaking in clear sentences with no shortness of breath. No distress. Right upper eye lid swolling near the medial canthus- no erythema or erythema to sclera. Pupils aligned appropriately. No visual deficits determined.  Left eye normal.   Impression and Recommendations:    Problem List Items Addressed This Visit     Generalized anxiety disorder    I do feel she would greatly benefit from psychiatry services given her uncontrolled anxiety symptoms.  We discussed that we are unable to provide long term benzodiazepines from this practice, but I will be happy to re-write her original prescription so that she can get back on her usual dosing given the severity of symptoms she is experiencing.  I will defer to psychiatry for recommendations on maintaining this medication.  Discussion of the risks of benzodiazepines, however, this patient does seem to greatly benefit from their effectiveness and is on maintenance  medications for her mood.  Age needs to be considered, however, the long term use of the medication is also considered in this decision and has been deemed as a safe option for her at this time.  Will take lead from psychiatry for recommendations for further medication management once she is established.  She will let  me know if her symptoms do not improve with the change in dosing schedule.       Relevant Medications   ALPRAZolam (XANAX) 0.25 MG tablet   Irritation of right eye - Primary    Video visit limits ability to closely visualize the eye, although, able to see eye movement and sclera are WNL with no visual acuity issues present when reading her phone on the video visit.  Swelling is present on the top lid near the medial canthus. Unable to visualize underside of lid.  Given irritation and itching present, will go ahead and treat for suspected bacterial infection. Possible chalazion present that cannot be visualized.  Recommend warm compresses 4-5 times a day until resolved. If symptoms persist, worsen, or spread to the opposite eye, or new symptoms develop she will let me know.       Relevant Medications   ciprofloxacin-dexamethasone (CIPRODEX) OTIC suspension    orders and follow up as documented in EMR I discussed the assessment and treatment plan with the patient. The patient was provided an opportunity to ask questions and all were answered. The patient agreed with the plan and demonstrated an understanding of the instructions.   The patient was advised to call back or seek an in-person evaluation if the symptoms worsen or if the condition fails to improve as anticipated.  Follow-Up: prn  I provided 20 minutes of non-face-to-face interaction with this non face-to-face encounter including intake, same-day documentation, and chart review.   Orma Render, NP , DNP, AGNP-c [facility] [phone] (fax)

## 2020-12-09 NOTE — Assessment & Plan Note (Signed)
Video visit limits ability to closely visualize the eye, although, able to see eye movement and sclera are WNL with no visual acuity issues present when reading her phone on the video visit.  Swelling is present on the top lid near the medial canthus. Unable to visualize underside of lid.  Given irritation and itching present, will go ahead and treat for suspected bacterial infection. Possible chalazion present that cannot be visualized.  Recommend warm compresses 4-5 times a day until resolved. If symptoms persist, worsen, or spread to the opposite eye, or new symptoms develop she will let me know.

## 2020-12-09 NOTE — Telephone Encounter (Signed)
Checked price on [name] [name] which still comes back as a $75 co-pay. Pt has Medicare and will need to apply for patient assistance for Trelegy.

## 2020-12-13 ENCOUNTER — Other Ambulatory Visit (HOSPITAL_BASED_OUTPATIENT_CLINIC_OR_DEPARTMENT_OTHER): Payer: Self-pay | Admitting: Nurse Practitioner

## 2020-12-13 NOTE — Progress Notes (Signed)
Erroneous encounter

## 2020-12-15 ENCOUNTER — Telehealth (HOSPITAL_BASED_OUTPATIENT_CLINIC_OR_DEPARTMENT_OTHER): Payer: Self-pay

## 2020-12-15 ENCOUNTER — Other Ambulatory Visit (HOSPITAL_BASED_OUTPATIENT_CLINIC_OR_DEPARTMENT_OTHER): Payer: Self-pay | Admitting: Nurse Practitioner

## 2020-12-15 DIAGNOSIS — Z741 Need for assistance with personal care: Secondary | ICD-10-CM

## 2020-12-15 DIAGNOSIS — R2681 Unsteadiness on feet: Secondary | ICD-10-CM

## 2020-12-15 MED ORDER — AMBULATORY NON FORMULARY MEDICATION: 0 refills | Status: AC

## 2020-12-15 NOTE — Telephone Encounter (Signed)
Patient called and left a voicemail stating that her eye is still irritated and swollen They eye drops that she was prescribed have helped but have not completely resolved the pain and swelling She inquired as to whether she should continue to take the eye drops or advisement on other treatment if that that is not the recommendation Will forward to Worthy Keeler, DNP for advisement

## 2020-12-16 NOTE — Telephone Encounter (Signed)
Patient is aware of the below and verbalized understanding. Patient declined referral to optometrist at this time and states her eye is improving.

## 2020-12-20 ENCOUNTER — Encounter: Payer: Self-pay | Admitting: Pulmonary Disease

## 2020-12-20 ENCOUNTER — Other Ambulatory Visit: Payer: Self-pay

## 2020-12-20 ENCOUNTER — Ambulatory Visit (INDEPENDENT_AMBULATORY_CARE_PROVIDER_SITE_OTHER): Payer: Medicare HMO | Admitting: Pulmonary Disease

## 2020-12-20 VITALS — BP 124/82 | HR 87 | Ht 66.0 in | Wt 253.2 lb

## 2020-12-20 DIAGNOSIS — J449 Chronic obstructive pulmonary disease, unspecified: Secondary | ICD-10-CM

## 2020-12-20 DIAGNOSIS — R918 Other nonspecific abnormal finding of lung field: Secondary | ICD-10-CM | POA: Diagnosis not present

## 2020-12-20 DIAGNOSIS — T7020XD Unspecified effects of high altitude, subsequent encounter: Secondary | ICD-10-CM

## 2020-12-20 DIAGNOSIS — R911 Solitary pulmonary nodule: Secondary | ICD-10-CM

## 2020-12-20 DIAGNOSIS — Z87891 Personal history of nicotine dependence: Secondary | ICD-10-CM

## 2020-12-20 DIAGNOSIS — G4733 Obstructive sleep apnea (adult) (pediatric): Secondary | ICD-10-CM | POA: Diagnosis not present

## 2020-12-20 MED ORDER — DEXAMETHASONE 4 MG PO TABS: 4.0000 mg | ORAL_TABLET | Freq: Two times a day (BID) | ORAL | 0 refills | Status: AC

## 2020-12-20 NOTE — Progress Notes (Signed)
Synopsis: Referred in July 2022 for lung nodule, by Dr.Olalere, PCP: By Orma Render, NP  Subjective:   PATIENT ID: [name] [name] GENDER: female DOB: [birth year], MRN: [id number]  Chief Complaint  Patient presents with   Follow-up    2 mo f/u for COPD. States she is still having DOE. Trelegy is $75 a month, which patient can not afford.     This is a 80 year old female, patient of Dr. Ander Slade, referred for right lower lobe pulmonary nodule.  She is a former smoker quit in 1988.  She had a CT scan of the chest completed in November 2020 that is visible in PACS.  This showed a 4 mm right lower lobe pulmonary nodule.  She had CT scan follow-up that was completed.  CT follow-up imaging was completed on 07/22/2020.  This CT scan of the chest revealed a 7 mm right lower lobe pulmonary nodule that was previously 4 mm and had increased in size and now has slight spiculated margins.  She had this followed by a nuclear medicine pet image on 08/19/2020.  Nuclear medicine pet imaging shows no significant hypermetabolism however the lesion itself is smaller than the lower limit for size related to PET scans.  From a respiratory standpoint she does feel short of breath with exertion.  Her mobility is aided by a rolling walker.  She is currently using as needed albuterol.  Has been told she may have COPD in the past.  She does have PFTs scheduled for today which have not been completed yet.  OV 12/20/2020: Here today for follow-up regarding COPD, shortness of breath.  She does have OSA follows with Dr. Jenetta Downer.  She enjoys using her Trelegy.  However this is costly.  She is unable to afford it.  She would like to stay on it if possible.  We will fill out the financial aid application to help her.  She also has a planned trip to [place].  She has experienced high altitude sickness before.  Associated with headache and vomiting.  She is worried about this happening again.  We talked about prophylactic measures  today.   Past Medical History:  Diagnosis Date   Allergy    Anxiety    Arthritis    Blood transfusion without reported diagnosis    Cancer ([place])    Melanoma on Great toe   Cataract    COPD (chronic obstructive pulmonary disease) ([place])    Depression    GERD (gastroesophageal reflux disease)    Hyperlipidemia    Hypertension    Osteoporosis    Sleep apnea      Family History  Problem Relation Age of Onset   Cancer Mother    Intellectual disability Mother    Cancer Father    Early death Father    Hypertension Father    Cancer Sister    Arthritis Maternal Grandmother    Asthma Paternal Grandmother    Asthma Paternal Grandfather      Past Surgical History:  Procedure Laterality Date   ABDOMINAL HYSTERECTOMY  1980   CHOLECYSTECTOMY  1980   MELANOMA EXCISION Right    Great Toe   REPLACEMENT TOTAL KNEE Bilateral 2000    Social History   Socioeconomic History   Marital status: Widowed    Spouse name: Not on file   Number of children: Not on file   Years of education: Not on file   Highest education level: Not on file  Occupational History   Not on file  Tobacco Use   Smoking status: Former    Types: Cigarettes    Start date: 65    Quit date: 1988    Years since quitting: 34.8   Smokeless tobacco: Never  Vaping Use   Vaping Use: Never used  Substance and Sexual Activity   Alcohol use: Not Currently   Drug use: Never   Sexual activity: Not Currently  Other Topics Concern   Not on file  Social History Narrative   Not on file   Social Determinants of Health   Financial Resource Strain: Low Risk    Difficulty of Paying Living Expenses: Not hard at all  Food Insecurity: No Food Insecurity   Worried About Charity fundraiser in the Last Year: Never true   [place] in the Last Year: Never true  Transportation Needs: No Transportation Needs   Lack of Transportation (Medical): No   Lack of Transportation (Non-Medical): No  Physical Activity:  Sufficiently Active   Days of Exercise per Week: 3 days   Minutes of Exercise per Session: 60 min  Stress: No Stress Concern Present   Feeling of Stress : Not at all  Social Connections: Moderately Isolated   Frequency of Communication with Friends and Family: More than three times a week   Frequency of Social Gatherings with Friends and Family: More than three times a week   Attends Religious Services: Never   Marine scientist or Organizations: Yes   Attends Music therapist: More than 4 times per year   Marital Status: Widowed  Human resources officer Violence: Not At Risk   Fear of Current or Ex-Partner: No   Emotionally Abused: No   Physically Abused: No   Sexually Abused: No     Allergies  Allergen Reactions   Azithromycin Anaphylaxis and Hives   Bee Venom Anaphylaxis   Erythromycin Base Anaphylaxis   Metronidazole Anaphylaxis   Nitrofurantoin Macrocrystal Anaphylaxis   Penicillins Anaphylaxis and Hives    childhood   Sulfa Antibiotics Anaphylaxis and Hives    Childhood   Cymbalta [Duloxetine Hcl]    Ivp Dye [Iodinated Diagnostic Agents]    Penicillin G Hives   Codeine Rash   Erythromycin Rash and Hives   Levofloxacin Rash   Nitrofurantoin Hives   Percocet [Oxycodone-Acetaminophen] Rash     Outpatient Medications Prior to Visit  Medication Sig Dispense Refill   albuterol (VENTOLIN HFA) 108 (90 Base) MCG/ACT inhaler Inhale 1-2 puffs into the lungs every 6 (six) hours as needed for wheezing or shortness of breath.     ALPRAZolam (XANAX) 0.25 MG tablet Take 1 tablet (0.25 mg total) by mouth 2 (two) times daily as needed for anxiety. 60 tablet 0   AMBULATORY NON FORMULARY MEDICATION One four wheel rolling walker with seat and hand brakes. Brand/style covered by insurance. Medically necessary for ICD-10: R26.81, Z74.1 1 Units 0   amLODipine (NORVASC) 5 MG tablet Take 5 mg by mouth daily.     ARTHRITIS PAIN RELIEVING 0.075 % topical cream Apply topically.      aspirin EC 81 MG tablet Take 81 mg by mouth daily. Swallow whole.     atorvastatin (LIPITOR) 10 MG tablet Take 10 mg by mouth daily.     Bacillus Coagulans-Inulin (PROBIOTIC FORMULA) 1-250 BILLION-MG CAPS Take by mouth.     camphor-menthol (SARNA) lotion Apply 1 application topically as needed for itching. 222 mL 0   cetirizine (ZYRTEC) 10 MG tablet Take 10 mg by mouth daily.  ciprofloxacin-dexamethasone (CIPRODEX) OTIC suspension One drop in right eye three times a day for 3-5 days until swelling and irritation has disappeared. 7.5 mL 0   diphenhydrAMINE (BENADRYL) 25 mg capsule Take 1 capsule (25 mg total) by mouth every 6 (six) hours as needed. For mild urticaria, rash, or pruritis with ciprofloxacin use. Notify provider: [phone] if used. 10 capsule 1   EPINEPHrine 0.3 mg/0.3 mL IJ SOAJ injection SMARTSIG:0.3 Milligram(s) IM Once PRN     famotidine (PEPCID) 20 MG tablet Take 1 tablet (20 mg total) by mouth daily as needed for heartburn or indigestion. 90 tablet 3   Fluticasone-Umeclidin-Vilant (TRELEGY ELLIPTA) 100-62.5-25 MCG/INH AEPB Inhale 1 puff into the lungs daily. 1 each 3   hydrALAZINE (APRESOLINE) 25 MG tablet Take 25 mg by mouth 2 (two) times daily.     hydrochlorothiazide (HYDRODIURIL) 25 MG tablet Take 0.5 tablets (12.5 mg total) by mouth daily. 45 tablet 1   Lactase 9000 units TABS Take one tablet with the first bite of meals containing milk or dairy products. 90 tablet 11   losartan (COZAAR) 100 MG tablet Take 100 mg by mouth daily.     mirtazapine (REMERON) 7.5 MG tablet Take 1 tablet (7.5 mg total) by mouth at bedtime. For depression and anxiety symptoms 30 tablet 2   pantoprazole (PROTONIX) 40 MG tablet TAKE ONE TABLET BY MOUTH EVERY MORNING FOR GERD 30 tablet 0   PARoxetine (PAXIL) 40 MG tablet Take 40 mg by mouth daily.     Probiotic Product (PROBIOTIC ACIDOPHILUS BEADS) CAPS Take 250 mg by mouth 2 (two) times daily.     Fluticasone-Umeclidin-Vilant (TRELEGY ELLIPTA)  100-62.5-25 MCG/INH AEPB Inhale 1 puff into the lungs daily. 1 each 0   No facility-administered medications prior to visit.    Review of Systems  Constitutional:  Negative for chills, fever, malaise/fatigue and weight loss.  HENT:  Negative for hearing loss, sore throat and tinnitus.   Eyes:  Negative for blurred vision and double vision.  Respiratory:  Positive for shortness of breath. Negative for cough, hemoptysis, sputum production, wheezing and stridor.   Cardiovascular:  Negative for chest pain, palpitations, orthopnea, leg swelling and PND.  Gastrointestinal:  Negative for abdominal pain, constipation, diarrhea, heartburn, nausea and vomiting.  Genitourinary:  Negative for dysuria, hematuria and urgency.  Musculoskeletal:  Negative for joint pain and myalgias.  Skin:  Negative for itching and rash.  Neurological:  Negative for dizziness, tingling, weakness and headaches.  Endo/Heme/Allergies:  Negative for environmental allergies. Does not bruise/bleed easily.  Psychiatric/Behavioral:  Negative for depression. The patient is not nervous/anxious and does not have insomnia.   All other systems reviewed and are negative.   Objective:  Physical Exam Vitals reviewed.  Constitutional:      General: She is not in acute distress.    Appearance: She is well-developed. She is obese.  HENT:     Head: Normocephalic and atraumatic.  Eyes:     General: No scleral icterus.    Conjunctiva/sclera: Conjunctivae normal.     Pupils: Pupils are equal, round, and reactive to light.  Neck:     Vascular: No JVD.     Trachea: No tracheal deviation.  Cardiovascular:     Rate and Rhythm: Normal rate and regular rhythm.     Heart sounds: Normal heart sounds. No murmur heard. Pulmonary:     Effort: Pulmonary effort is normal. No tachypnea, accessory muscle usage or respiratory distress.     Breath sounds: Normal breath sounds. No stridor.  No wheezing, rhonchi or rales.  Abdominal:     General:  Bowel sounds are normal. There is no distension.     Palpations: Abdomen is soft.     Tenderness: There is no abdominal tenderness.  Musculoskeletal:        General: No tenderness.     Cervical back: Neck supple.     Right lower leg: Edema present.     Left lower leg: Edema present.     Comments: Uses rolling walker  Lymphadenopathy:     Cervical: No cervical adenopathy.  Skin:    General: Skin is warm and dry.     Capillary Refill: Capillary refill takes less than 2 seconds.     Findings: No rash.  Neurological:     Mental Status: She is alert and oriented to person, place, and time.  Psychiatric:        Behavior: Behavior normal.     Vitals:   12/20/20 0957  BP: 124/82  Pulse: 87  SpO2: 98%  Weight: 253 lb 3.2 oz (114.9 kg)  Height: 5\' 6"  (1.676 m)   98% on RA  BMI Readings from Last 3 Encounters:  12/20/20 40.87 kg/m  11/04/20 41.00 kg/m  10/18/20 42.30 kg/m   Wt Readings from Last 3 Encounters:  12/20/20 253 lb 3.2 oz (114.9 kg)  11/04/20 254 lb (115.2 kg)  10/18/20 254 lb 3.2 oz (115.3 kg)     CBC    Component Value Date/Time   WBC 9.3 12/29/2019 1434   RBC 4.25 12/29/2019 1434   HGB 11.9 12/29/2019 1434   HCT 36.4 12/29/2019 1434   PLT 312 12/29/2019 1434   MCV 86 12/29/2019 1434   MCH 28.0 12/29/2019 1434   MCHC 32.7 12/29/2019 1434   RDW 14.5 12/29/2019 1434      Chest Imaging: June 2022 CT chest: Small 7 mm right lower lobe pulmonary nodule adjacent to the pleura.  Slowly enlarging.  Last CT scan was in November 2020.  It has gotten bigger since 2020. The patient's images have been independently reviewed by me.    Pulmonary Functions Testing Results: PFT Results Latest Ref Rng & Units 09/02/2020  FVC-Pre L 1.73  FVC-Predicted Pre % 63  FVC-Post L 1.81  FVC-Predicted Post % 66  Pre FEV1/FVC % % 80  Post FEV1/FCV % % 84  FEV1-Pre L 1.39  FEV1-Predicted Pre % 68  FEV1-Post L 1.52  DLCO uncorrected ml/min/mmHg 14.28  DLCO UNC% % 74   DLCO corrected ml/min/mmHg 14.28  DLCO COR %Predicted % 74  DLVA Predicted % 97  TLC L 5.24  TLC % Predicted % 102  RV % Predicted % 131    FeNO:   Pathology:   Echocardiogram:   Heart Catheterization:     Assessment & Plan:     ICD-10-CM   1. Effects of high altitude, subsequent encounter  T70.20XD     2. Chronic obstructive pulmonary disease, unspecified COPD type ([place])  J44.9     3. Obstructive sleep apnea syndrome  G47.33     4. Pulmonary nodules  R91.8     5. Lung nodule  R91.1     6. Former smoker  Z87.891     69. Right lower lobe pulmonary nodule  R91.1       Discussion:  This is a 80 year old female, new right lower lobe pulmonary nodule that had slowly seen some enlargement since 2020.  She has a functional status of using a rolling walker at baseline.  She does have shortness of breath.  She is a former smoker that quit in 1988.  Last office visit we discussed the right lower lobe nodule in detail with plans for CT scan follow-up in January 2023 this has been ordered.  Subsequently she has had follow-up with nurse practitioner here in the office.  She was placed on Trelegy and she enjoys being on this it does help with her symptoms her cough and shortness of breath are improved but she has difficulty affording this.  Plan: We will have a [place] financial aid application to help with Trelegy cost Continue Trelegy 100 daily Continue albuterol as needed She has a planned trip with a history of high-altitude sickness in [place] to visit her sister in January. We will give her prophylactic dexamethasone 4 mg every 12 hours starting on the day of flight and ending 1 day after her visit starts. Unable to use acetazolamide due to patient's sulfa allergy.  We discussed this today in the office patient will follow-up with [place] after her CT scan either me or Eric Form, NP in January 2023.    Current Outpatient Medications:    albuterol (VENTOLIN HFA) 108 (90 Base)  MCG/ACT inhaler, Inhale 1-2 puffs into the lungs every 6 (six) hours as needed for wheezing or shortness of breath., Disp: , Rfl:    ALPRAZolam (XANAX) 0.25 MG tablet, Take 1 tablet (0.25 mg total) by mouth 2 (two) times daily as needed for anxiety., Disp: 60 tablet, Rfl: 0   AMBULATORY NON FORMULARY MEDICATION, One four wheel rolling walker with seat and hand brakes. Brand/style covered by insurance. Medically necessary for ICD-10: R26.81, Z74.1, Disp: 1 Units, Rfl: 0   amLODipine (NORVASC) 5 MG tablet, Take 5 mg by mouth daily., Disp: , Rfl:    ARTHRITIS PAIN RELIEVING 0.075 % topical cream, Apply topically., Disp: , Rfl:    aspirin EC 81 MG tablet, Take 81 mg by mouth daily. Swallow whole., Disp: , Rfl:    atorvastatin (LIPITOR) 10 MG tablet, Take 10 mg by mouth daily., Disp: , Rfl:    Bacillus Coagulans-Inulin (PROBIOTIC FORMULA) 1-250 BILLION-MG CAPS, Take by mouth., Disp: , Rfl:    camphor-menthol (SARNA) lotion, Apply 1 application topically as needed for itching., Disp: 222 mL, Rfl: 0   cetirizine (ZYRTEC) 10 MG tablet, Take 10 mg by mouth daily., Disp: , Rfl:    ciprofloxacin-dexamethasone (CIPRODEX) OTIC suspension, One drop in right eye three times a day for 3-5 days until swelling and irritation has disappeared., Disp: 7.5 mL, Rfl: 0   dexamethasone (DECADRON) 4 MG tablet, Take 1 tablet (4 mg total) by mouth 2 (two) times daily with a meal for 2 days., Disp: 4 tablet, Rfl: 0   diphenhydrAMINE (BENADRYL) 25 mg capsule, Take 1 capsule (25 mg total) by mouth every 6 (six) hours as needed. For mild urticaria, rash, or pruritis with ciprofloxacin use. Notify provider: [phone] if used., Disp: 10 capsule, Rfl: 1   EPINEPHrine 0.3 mg/0.3 mL IJ SOAJ injection, SMARTSIG:0.3 Milligram(s) IM Once PRN, Disp: , Rfl:    famotidine (PEPCID) 20 MG tablet, Take 1 tablet (20 mg total) by mouth daily as needed for heartburn or indigestion., Disp: 90 tablet, Rfl: 3   Fluticasone-Umeclidin-Vilant (TRELEGY  ELLIPTA) 100-62.5-25 MCG/INH AEPB, Inhale 1 puff into the lungs daily., Disp: 1 each, Rfl: 3   hydrALAZINE (APRESOLINE) 25 MG tablet, Take 25 mg by mouth 2 (two) times daily., Disp: , Rfl:    hydrochlorothiazide (HYDRODIURIL) 25 MG tablet, Take  0.5 tablets (12.5 mg total) by mouth daily., Disp: 45 tablet, Rfl: 1   Lactase 9000 units TABS, Take one tablet with the first bite of meals containing milk or dairy products., Disp: 90 tablet, Rfl: 11   losartan (COZAAR) 100 MG tablet, Take 100 mg by mouth daily., Disp: , Rfl:    mirtazapine (REMERON) 7.5 MG tablet, Take 1 tablet (7.5 mg total) by mouth at bedtime. For depression and anxiety symptoms, Disp: 30 tablet, Rfl: 2   pantoprazole (PROTONIX) 40 MG tablet, TAKE ONE TABLET BY MOUTH EVERY MORNING FOR GERD, Disp: 30 tablet, Rfl: 0   PARoxetine (PAXIL) 40 MG tablet, Take 40 mg by mouth daily., Disp: , Rfl:    Probiotic Product (PROBIOTIC ACIDOPHILUS BEADS) CAPS, Take 250 mg by mouth 2 (two) times daily., Disp: , Rfl:    Garner Nash, DO [facility] 12/20/2020 10:14 AM

## 2020-12-20 NOTE — Patient Instructions (Signed)
Thank you for visiting Dr. Valeta Harms at [facility]. Today we recommend the following:  Meds ordered this encounter  Medications   dexamethasone (DECADRON) 4 MG tablet    Sig: Take 1 tablet (4 mg total) by mouth 2 (two) times daily with a meal for 2 days.    Dispense:  4 tablet    Refill:  0   Application for [employer] financial aid for Trelegy   Take dexamethasone starting one day before your trip to [place]   CT Chest in Jan 2023, follow up appt after.   Return in about 2 months (around 02/19/2021) for with Eric Form, NP, or Dr. Valeta Harms.    Please do your part to reduce the spread of COVID-19.

## 2020-12-21 ENCOUNTER — Encounter: Payer: Self-pay | Admitting: Obstetrics and Gynecology

## 2020-12-21 ENCOUNTER — Ambulatory Visit (INDEPENDENT_AMBULATORY_CARE_PROVIDER_SITE_OTHER): Payer: Medicare HMO | Admitting: Obstetrics and Gynecology

## 2020-12-21 VITALS — BP 135/73 | HR 101 | Ht 65.0 in | Wt 250.0 lb

## 2020-12-21 DIAGNOSIS — N390 Urinary tract infection, site not specified: Secondary | ICD-10-CM | POA: Diagnosis not present

## 2020-12-21 DIAGNOSIS — N393 Stress incontinence (female) (male): Secondary | ICD-10-CM

## 2020-12-21 DIAGNOSIS — N3941 Urge incontinence: Secondary | ICD-10-CM

## 2020-12-21 DIAGNOSIS — N952 Postmenopausal atrophic vaginitis: Secondary | ICD-10-CM | POA: Diagnosis not present

## 2020-12-21 DIAGNOSIS — R82998 Other abnormal findings in urine: Secondary | ICD-10-CM

## 2020-12-21 DIAGNOSIS — R350 Frequency of micturition: Secondary | ICD-10-CM

## 2020-12-21 LAB — POCT URINALYSIS DIPSTICK
Appearance: ABNORMAL
Bilirubin, UA: NEGATIVE
Blood, UA: NEGATIVE
Glucose, UA: NEGATIVE
Ketones, UA: NEGATIVE
Nitrite, UA: POSITIVE
Protein, UA: POSITIVE — AB
Spec Grav, UA: 1.02 (ref 1.010–1.025)
Urobilinogen, UA: 0.2 E.U./dL
pH, UA: 6 (ref 5.0–8.0)

## 2020-12-21 MED ORDER — ESTRADIOL 0.1 MG/GM VA CREA: TOPICAL_CREAM | VAGINAL | 11 refills | Status: DC

## 2020-12-21 MED ORDER — FOSFOMYCIN TROMETHAMINE 3 G PO PACK: 3.0000 g | PACK | Freq: Once | ORAL | 0 refills | Status: AC

## 2020-12-21 NOTE — Patient Instructions (Addendum)
Start vaginal estrogen therapy nightly for two weeks then 2 times a week at night for treatment of vaginal atrophy (dryness of the vaginal tissues) and to treat recurrent infections.    We will try to obtain a new controller for the Interstim device.

## 2020-12-21 NOTE — Progress Notes (Signed)
[name] [name]  Referring Provider: Early, Coralee Pesa, NP PCP: Orma Render, NP Date of Service: 12/21/2020  SUBJECTIVE Chief Complaint: New Patient (Initial Visit)  History of Present Illness: [name] [name] is a 80 y.o. White or Caucasian female presenting for evaluation of urinary tract infections.    Urinary Symptoms: Leaks urine with cough/ sneeze, laughing, lifting, going from sitting to standing, with a full bladder, with movement to the bathroom, with urgency, without sensation, while asleep, and continuously. UUI> SUI Leaks many time(s) per day.  Pad use: 1 adult diapers per day.   She is bothered by her UI symptoms. Has Interstim that was placed in [place]- she does not have the remote and unsure if it is on. Has been in place for about 5 years. She states she never felt like it worked well. She may have tried medications previous to this but is unsure which.   Day time voids: "many".  Nocturia: 1 times per night to void. Voiding dysfunction: she does not empty her bladder well.  does not use a catheter to empty bladder.  When urinating, she feels a weak stream  UTIs:  "many"  UTI's in the last year.  Reports she is having urine cultures done by PCP. Last documented by PCP that she had a culture done in September- no results available in Epic.  Denies history of blood in urine and kidney or bladder stones  Pelvic Organ Prolapse Symptoms:                  She Denies a feeling of a bulge the vaginal area.   Bowel Symptom: Bowel movements: 1 time(s) per day Stool consistency: soft  or loose Straining: no.  Splinting: no.  Incomplete evacuation: no.  She Denies accidental bowel leakage / fecal incontinence Bowel regimen: none  Sexual Function Sexually active: no.   Pelvic Pain Admits to pelvic pain- low pressure   Past Medical History:  Past Medical History:  Diagnosis Date   Allergy    Anxiety    Arthritis     Blood transfusion without reported diagnosis    Cancer ([place])    Melanoma on Great toe   Cataract    COPD (chronic obstructive pulmonary disease) (HCC)    Depression    GERD (gastroesophageal reflux disease)    Hyperlipidemia    Hypertension    Osteoporosis    Sleep apnea      Past Surgical History:   Past Surgical History:  Procedure Laterality Date   ABDOMINAL HYSTERECTOMY  1980   CHOLECYSTECTOMY  1980   MELANOMA EXCISION Right    Great Toe   REPLACEMENT TOTAL KNEE Bilateral 2000   sacral neuromodulation       Past OB/GYN History: OB History  Gravida Para Term Preterm AB Living  3         3  SAB IAB Ectopic Multiple Live Births          3    # Outcome Date GA Lbr Len/2nd Weight Sex Delivery Anes PTL Lv  3 Gravida           2 Gravida           1 Gravida             Vaginal deliveries: 3 S/p hysterectomy   Medications: She has a current medication list which includes the following prescription(s): alprazolam, amlodipine, estradiol, fosfomycin, albuterol, AMBULATORY NON FORMULARY MEDICATION, arthritis pain relieving, aspirin ec,  atorvastatin, probiotic formula, sarna, cetirizine, ciprofloxacin-dexamethasone, dexamethasone, diphenhydramine, epinephrine, famotidine, trelegy ellipta, hydralazine, hydrochlorothiazide, lactase, losartan, mirtazapine, pantoprazole, paroxetine, and probiotic acidophilus beads.   Allergies: Patient is allergic to azithromycin, bee venom, erythromycin base, metronidazole, nitrofurantoin macrocrystal, penicillins, sulfa antibiotics, cymbalta [duloxetine hcl], ivp dye [iodinated diagnostic agents], penicillin g, codeine, erythromycin, levofloxacin, nitrofurantoin, and percocet [oxycodone-acetaminophen].   Social History:  Social History   Tobacco Use   Smoking status: Former    Types: Cigarettes    Start date: 1957    Quit date: 1988    Years since quitting: 34.8   Smokeless tobacco: Never  Vaping Use   Vaping Use: Never used  Substance  Use Topics   Alcohol use: Not Currently   Drug use: Never    Relationship status: widowed She lives at [facility] senior living   Family History:   Family History  Problem Relation Age of Onset   Cancer Mother    Intellectual disability Mother    Cancer Father    Early death Father    Hypertension Father    Cancer Sister    Arthritis Maternal Grandmother    Asthma Paternal Grandmother    Asthma Paternal Grandfather      Review of Systems: Review of Systems  Constitutional:  Negative for fever, malaise/fatigue and weight loss.  Respiratory:  Positive for shortness of breath. Negative for cough and wheezing.   Cardiovascular:  Negative for chest pain, palpitations and leg swelling.  Gastrointestinal:  Negative for abdominal pain and blood in stool.  Genitourinary:  Negative for dysuria.       + vaginal discharge  Musculoskeletal:  Negative for myalgias.  Skin:  Negative for rash.  Neurological:  Negative for dizziness and headaches.  Endo/Heme/Allergies:  Does not bruise/bleed easily.  Psychiatric/Behavioral:  Negative for depression. The patient is nervous/anxious.     OBJECTIVE Physical Exam: Vitals:   12/21/20 1435  BP: 135/73  Pulse: (!) 101  Weight: 250 lb (113.4 kg)  Height: 5\' 5"  (1.651 m)    Physical Exam Constitutional:      General: She is not in acute distress. Pulmonary:     Effort: Pulmonary effort is normal.  Abdominal:     General: There is no distension.     Palpations: Abdomen is soft.     Tenderness: There is no abdominal tenderness. There is no rebound.  Musculoskeletal:        General: No swelling. Normal range of motion.  Skin:    General: Skin is warm and dry.     Findings: No rash.  Neurological:     Mental Status: She is alert and oriented to person, place, and time.  Psychiatric:        Mood and Affect: Mood normal.        Behavior: Behavior normal.     GU / Detailed Urogynecologic Evaluation:  Pelvic Exam: Agglutination  of labia minora superiorly near area of clitoral hood. Bartholin's and Skene's glands normal in appearance; urethral meatus normal in appearance, no urethral masses or discharge.   CST: negative  Urethra prepped with betadine. Straight catheterization performed to obtain urine sample- 72ml cloudy yellow urine obtained.   Very short vaginal length of 4cm- unable to perform speculum exam.   Pelvic floor strength I/V,  Pelvic floor musculature: Right levator tender, Right obturator non-tender, Left levator non-tender, Left obturator non-tender  POP-Q: deferred, no obvious prolapse  Rectal Exam:  Normal external rectum  Post-Void Residual (PVR) by Bladder Scan: In order to  evaluate bladder emptying, we discussed obtaining a postvoid residual and she agreed to this procedure.  Procedure: The ultrasound unit was placed on the patient's abdomen in the suprapubic region after the patient had voided. A PVR of 164 ml was obtained by bladder scan. (Waited approximately 30 min between void and bladder scan).   Laboratory Results: POC urine: moderate leukocytes, positive nitrites  ASSESSMENT AND PLAN Ms. Marchiano is a 80 y.o. with:  1. Recurrent UTI   2. Vaginal atrophy   3. Leukocytes in urine   4. Urinary frequency   5. Urge incontinence   6. SUI (stress urinary incontinence, female)    Recurrent UTI - will request record of urine cultures from her living facility to confirm infections.  - Options for prophylaxis include daily low dose antibiotic, transvaginal estrogen therapy, D-mannose, and cranberry supplements.  - Will prescribe estrace 0.5 g nightly for two nights then twice weekly.   2. Acute UTI - urine suspicious for infection. Has several allergies. Swill treat with fosfomycin 3g x1.  - Will also send today's urine for culture  3. Urge incontinence - has Interstim sacral neuromodulation but does not have remote and unsure if it is on.  - Will request additional controller  from interstim/ [employer] - we discussed that worsening incontinence can also be due to recurrent UTIs  4. SUI - will further discuss treatment options if other symptoms improve  Jaquita Folds, MD

## 2020-12-23 NOTE — Telephone Encounter (Signed)
Pt seen on 12/20/20 by Dr. Valeta Harms. Will close encounter.

## 2020-12-24 ENCOUNTER — Telehealth: Payer: Self-pay | Admitting: Obstetrics and Gynecology

## 2020-12-24 ENCOUNTER — Encounter: Payer: Self-pay | Admitting: Obstetrics and Gynecology

## 2020-12-24 LAB — URINE CULTURE

## 2020-12-24 NOTE — Telephone Encounter (Signed)
Patient called and stated she took Fosfomycin (one time dose) Tuesday night and shortly after broke out in a rash. The rash has persisted for a few days and she is taking benadryl. Denies any swelling or difficulty breathing. She has also recently started vaginal estrogen so advised to stop that for a few days as well. If rash clears up, she can try the estrogen cream again.

## 2020-12-27 ENCOUNTER — Encounter: Payer: Self-pay | Admitting: Obstetrics and Gynecology

## 2020-12-27 ENCOUNTER — Ambulatory Visit (INDEPENDENT_AMBULATORY_CARE_PROVIDER_SITE_OTHER): Payer: Medicare HMO

## 2020-12-27 ENCOUNTER — Telehealth: Payer: Self-pay | Admitting: Obstetrics and Gynecology

## 2020-12-27 ENCOUNTER — Other Ambulatory Visit (HOSPITAL_BASED_OUTPATIENT_CLINIC_OR_DEPARTMENT_OTHER): Payer: Self-pay | Admitting: Nurse Practitioner

## 2020-12-27 DIAGNOSIS — Z9189 Other specified personal risk factors, not elsewhere classified: Secondary | ICD-10-CM

## 2020-12-27 DIAGNOSIS — R55 Syncope and collapse: Secondary | ICD-10-CM | POA: Diagnosis not present

## 2020-12-27 LAB — CUP PACEART REMOTE DEVICE CHECK
Date Time Interrogation Session: 20221114235521
Implantable Pulse Generator Implant Date: 20191002

## 2020-12-27 NOTE — Telephone Encounter (Signed)
Patient called and reported that she still has an itchy rash from her hips to her neck that "looks like chicken pocks". I advised that the antibiotic she took was only one dose and it was taken over a week ago so is unlikely to still be from that. Denies any new detergents, foods, or topical exposures. Advised to call PCP.   Jaquita Folds, MD

## 2021-01-04 NOTE — Progress Notes (Signed)
Carelink Summary Report / Loop Recorder 

## 2021-01-10 ENCOUNTER — Telehealth (HOSPITAL_BASED_OUTPATIENT_CLINIC_OR_DEPARTMENT_OTHER): Payer: Self-pay | Admitting: Nurse Practitioner

## 2021-01-10 NOTE — Telephone Encounter (Signed)
Received a fax from after hours nurse sent on 01/07/21 @ 7:41 pm regarding pt wanted to speak to on-call to obtain orders for a UA. Pt stated she had vaginal pain, painful when urination and feels confused. Please advise.

## 2021-01-10 NOTE — Progress Notes (Signed)
[facility] Return Visit  SUBJECTIVE  History of Present Illness: [name] [name] is a 80 y.o. female seen in follow-up for recurrent urinary tract infections. Plan at last visit was to start vaginal estrogen cream. She was also treated with Fosfomycin for UTI.   Has been using the vaginal estrogen twice a week. Feels that she has another urinary tract infection- has burning and urgency. Broke out in a rash for 4 days after taking the last antibiotic.   Interstim device interrogated: no impedence in leads. Unable to configure since we do not have device information.   Past Medical History: Patient  has a past medical history of Allergy, Anxiety, Arthritis, Blood transfusion without reported diagnosis, Cancer ([place]), Cataract, COPD (chronic obstructive pulmonary disease) ([place]), Depression, GERD (gastroesophageal reflux disease), Hyperlipidemia, Hypertension, Osteoporosis, and Sleep apnea.   Past Surgical History: She  has a past surgical history that includes Abdominal hysterectomy (1980); Melanoma excision (Right); Cholecystectomy (1980); Replacement total knee (Bilateral, 2000); and sacral neuromodulation.   Medications: She has a current medication list which includes the following prescription(s): cephalexin, cephalexin, albuterol, alprazolam, AMBULATORY NON FORMULARY MEDICATION, amlodipine, arthritis pain relieving, aspirin ec, atorvastatin, probiotic formula, sarna, cetirizine, ciprofloxacin-dexamethasone, diphenhydramine, epinephrine, estradiol, famotidine, trelegy ellipta, hydralazine, hydrochlorothiazide, lactase, losartan, mirtazapine, pantoprazole, paroxetine, and probiotic acidophilus beads.   Allergies: Patient is allergic to azithromycin, bee venom, erythromycin base, metronidazole, nitrofurantoin macrocrystal, penicillins, sulfa antibiotics, cymbalta [duloxetine hcl], ivp dye [iodinated diagnostic agents], penicillin g, codeine, erythromycin, fosfomycin, levofloxacin,  nitrofurantoin, and percocet [oxycodone-acetaminophen].   Social History: Patient  reports that she quit smoking about 34 years ago. Her smoking use included cigarettes. She started smoking about 65 years ago. She has never used smokeless tobacco. She reports that she does not currently use alcohol. She reports that she does not use drugs.      OBJECTIVE     Physical Exam: Vitals:   01/11/21 1328  BP: (!) 177/70  Pulse: (!) 111   Gen: No apparent distress, A&O x 3.  Detailed Urogynecologic Evaluation:  Deferred.    POC urine: moderate leuks, positive nitrites  ASSESSMENT AND PLAN    [name] [name] is a 80 y.o. with:  1. Dysuria   2. Recurrent UTI   3. Urinary frequency    - Urine appears positive for UTI. Will treat with keflex QID x 5 days. Urine sent for culture.  - Discussed prophylactic antibiotics for 6 months for UTI prevention. Will use keflex since has several allergies. Keflex 250mg  daily prescribed.  - continue vaginal estrogen twice weekly.  - Records requested from provider who placed interstim in order to configure device.   [name] Folds, MD

## 2021-01-11 ENCOUNTER — Ambulatory Visit (INDEPENDENT_AMBULATORY_CARE_PROVIDER_SITE_OTHER): Payer: Medicare HMO | Admitting: Obstetrics and Gynecology

## 2021-01-11 ENCOUNTER — Other Ambulatory Visit (HOSPITAL_COMMUNITY)
Admission: RE | Admit: 2021-01-11 | Discharge: 2021-01-11 | Disposition: A | Discharge status: Home / Self Care | Payer: Medicare HMO | Source: Ambulatory Visit | Attending: Obstetrics and Gynecology | Admitting: Obstetrics and Gynecology

## 2021-01-11 ENCOUNTER — Encounter: Payer: Self-pay | Admitting: Obstetrics and Gynecology

## 2021-01-11 ENCOUNTER — Other Ambulatory Visit: Payer: Self-pay

## 2021-01-11 VITALS — BP 177/70 | HR 111

## 2021-01-11 DIAGNOSIS — R300 Dysuria: Secondary | ICD-10-CM | POA: Insufficient documentation

## 2021-01-11 DIAGNOSIS — R350 Frequency of micturition: Secondary | ICD-10-CM

## 2021-01-11 DIAGNOSIS — N390 Urinary tract infection, site not specified: Secondary | ICD-10-CM

## 2021-01-11 LAB — POCT URINALYSIS DIPSTICK
Appearance: ABNORMAL
Bilirubin, UA: NEGATIVE
Blood, UA: NEGATIVE
Glucose, UA: NEGATIVE
Ketones, UA: NEGATIVE
Nitrite, UA: POSITIVE
Protein, UA: POSITIVE — AB
Spec Grav, UA: 1.02 (ref 1.010–1.025)
Urobilinogen, UA: 0.2 E.U./dL
pH, UA: 6 (ref 5.0–8.0)

## 2021-01-11 MED ORDER — CEPHALEXIN 250 MG PO CAPS: 250.0000 mg | ORAL_CAPSULE | Freq: Four times a day (QID) | ORAL | 0 refills | Status: AC

## 2021-01-11 MED ORDER — CEPHALEXIN 250 MG PO CAPS: 250.0000 mg | ORAL_CAPSULE | Freq: Every day | ORAL | 5 refills | Status: DC

## 2021-01-11 NOTE — Telephone Encounter (Signed)
Contacted [facility] regarding patients issues and they confirmed they have standing orders for the patient to have a UA Patient has also established with urogyn and is scheduled to see them regarding her urinary issues

## 2021-01-14 LAB — URINE CULTURE: Culture: >=100000 — AB

## 2021-01-24 ENCOUNTER — Other Ambulatory Visit (HOSPITAL_BASED_OUTPATIENT_CLINIC_OR_DEPARTMENT_OTHER): Payer: Self-pay | Admitting: Nurse Practitioner

## 2021-01-24 ENCOUNTER — Ambulatory Visit (INDEPENDENT_AMBULATORY_CARE_PROVIDER_SITE_OTHER): Payer: Medicare HMO

## 2021-01-24 ENCOUNTER — Telehealth: Payer: Self-pay

## 2021-01-24 DIAGNOSIS — R55 Syncope and collapse: Secondary | ICD-10-CM | POA: Diagnosis not present

## 2021-01-24 DIAGNOSIS — F411 Generalized anxiety disorder: Secondary | ICD-10-CM

## 2021-01-24 NOTE — Telephone Encounter (Signed)
[name] [name] is a 80 y.o. female called in because she said the Keflex that was prescribed id giving her bad diarrhea. I told the patient to stop the medication and I will notify Dr.Schroeder and she will sent an alternative to the pharmacy if one is available.

## 2021-01-25 ENCOUNTER — Telehealth: Payer: Self-pay | Admitting: Pulmonary Disease

## 2021-01-25 LAB — CUP PACEART REMOTE DEVICE CHECK
Date Time Interrogation Session: 20221217235946
Implantable Pulse Generator Implant Date: 20191002

## 2021-01-25 NOTE — Telephone Encounter (Signed)
Needs office visit for eval and tx  Have plenty of openings this week  Please contact office for sooner follow up if symptoms do not improve or worsen or seek emergency care

## 2021-01-25 NOTE — Telephone Encounter (Signed)
Called and spoke with patient who states that she is congested in her broncheial tubes esepcially when walking. States it's been intense for the past 10 days. Patient states that she has a productive cough with green sputum. Pt is using inhaler as instructed, but wonders if there is anything else she can do. Denies any fever. Pharmacy [facility]   Tammy please advise

## 2021-01-25 NOTE — Telephone Encounter (Signed)
Spoke with the pt and scheduled for appt with [name] (video is all she is able to do)

## 2021-01-25 NOTE — Telephone Encounter (Signed)
Next Appt With Pulmonology Martyn Ehrich, NP) 01/26/2021 at 4:00 PM

## 2021-01-26 ENCOUNTER — Telehealth: Payer: Self-pay

## 2021-01-26 ENCOUNTER — Encounter: Payer: Medicare HMO | Admitting: Primary Care

## 2021-01-26 ENCOUNTER — Other Ambulatory Visit: Payer: Self-pay

## 2021-01-26 NOTE — Progress Notes (Signed)
 Err

## 2021-01-27 ENCOUNTER — Telehealth (INDEPENDENT_AMBULATORY_CARE_PROVIDER_SITE_OTHER): Payer: Medicare HMO | Admitting: Primary Care

## 2021-01-27 DIAGNOSIS — J441 Chronic obstructive pulmonary disease with (acute) exacerbation: Secondary | ICD-10-CM | POA: Diagnosis not present

## 2021-01-27 MED ORDER — GUAIFENESIN ER 600 MG PO TB12: 600.0000 mg | ORAL_TABLET | Freq: Two times a day (BID) | ORAL | 0 refills | Status: AC

## 2021-01-27 MED ORDER — PREDNISONE 10 MG PO TABS: ORAL_TABLET | ORAL | 0 refills | Status: DC

## 2021-01-27 MED ORDER — DOXYCYCLINE HYCLATE 100 MG PO TABS: 100.0000 mg | ORAL_TABLET | Freq: Two times a day (BID) | ORAL | 0 refills | Status: DC

## 2021-01-27 MED ORDER — ALBUTEROL SULFATE HFA 108 (90 BASE) MCG/ACT IN AERS: 1.0000 | INHALATION_SPRAY | RESPIRATORY_TRACT | 1 refills | Status: DC | PRN

## 2021-01-27 NOTE — Telephone Encounter (Signed)
Phone call

## 2021-01-27 NOTE — Telephone Encounter (Signed)
[name] [name] is a 80 y.o. female was cantacted and notified of the message from [name]. Pt said "another Dr put her on another antibiotic for her breathing and maybe that will clear things up." I asked which Dr and the name of the antibiotic and she said she cant think right now and will call me back.

## 2021-01-27 NOTE — Progress Notes (Signed)
Virtual Visit via Video Note  I connected with [name] [name] on 01/27/21 at 11:30 AM EST by a video enabled telemedicine application and verified that I am speaking with the correct person using two identifiers.  Location: Patient: Home Provider: Office   I discussed the limitations of evaluation and management by telemedicine and the availability of in person appointments. The patient expressed understanding and agreed to proceed.  History of Present Illness: 80 year old female, former smoker. PMH significant for COPD, OSA, HTN, CAD, GERD, bipolar disease, chronic pain syndrome. Patient of Dr. Valeta Harms, last see on 12/20/20.   Previous [facility] pulmonary encounter: 12/20/20- Dr. Mariana Single  This is a 80 year old female, patient of Dr. Ander Slade, referred for right lower lobe pulmonary nodule.  She is a former smoker quit in 1988.  She had a CT scan of the chest completed in November 2020 that is visible in PACS.  This showed a 4 mm right lower lobe pulmonary nodule.  She had CT scan follow-up that was completed.  CT follow-up imaging was completed on 07/22/2020.  This CT scan of the chest revealed a 7 mm right lower lobe pulmonary nodule that was previously 4 mm and had increased in size and now has slight spiculated margins.  She had this followed by a nuclear medicine pet image on 08/19/2020.  Nuclear medicine pet imaging shows no significant hypermetabolism however the lesion itself is smaller than the lower limit for size related to PET scans.  From a respiratory standpoint she does feel short of breath with exertion.  Her mobility is aided by a rolling walker.  She is currently using as needed albuterol.  Has been told she may have COPD in the past.  She does have PFTs scheduled for today which have not been completed yet.  OV 12/20/2020: Here today for follow-up regarding COPD, shortness of breath.  She does have OSA follows with Dr. Jenetta Downer.  She enjoys using her Trelegy.  However this is costly.  She is unable  to afford it.  She would like to stay on it if possible.  We will fill out the financial aid application to help her.  She also has a planned trip to [place].  She has experienced high altitude sickness before.  Associated with headache and vomiting.  She is worried about this happening again.  We talked about prophylactic measures today.  01/27/2021- Interim hx  Patient contacted today for virtual video visit. She reports having chest congestion with associated shortness of breath x 10 days. Cough is productive with green mucus. She is compliant with Trelegy 129mcg daily but feels it has not been helping the last week or so. She is currently on kefex for UTI. She has some dirrhea from abx. She is on probiotic. Denies f/c/s, chest discomfort, nausea or vomiting. No know exposure to covid or influenza.   Observations/Objective:  - Appears well; No overt shortness of breath or wheezing  Assessment and Plan:  COPD exacerbation: - Patient has had a productive cough with associated sob x 10 days. Symptoms are consistent with acute exacerbation of her underlyin COPD. If sob persists may consider increasing Trelegy to 280mcg or switching to [employer]  - Continue Trelegy 141mcg one puff daily and prn albuterol 2 puffs every 4-6 hours  - Rx Doxycycline 100mg  BID x 7 days and prednisone taper 40mg  x 2 days; 30mg  x 2 days; 20mg  x 2 days; 10mg  x 2 days  - Advised she take mucinex 600mg  twice daily until  chest congestion improved   Follow Up Instructions:  - Call or return if symptoms do not improve in 5-7 days or worsen    I discussed the assessment and treatment plan with the patient. The patient was provided an opportunity to ask questions and all were answered. The patient agreed with the plan and demonstrated an understanding of the instructions.   The patient was advised to call back or seek an in-person evaluation if the symptoms worsen or if the condition fails to improve as  anticipated.  I provided 25 minutes of non-face-to-face time during this encounter.   Martyn Ehrich, NP

## 2021-01-27 NOTE — Patient Instructions (Signed)
COPD exacerbation Start doxycycline and prednisone taper Start mucinex 600mg  twice daily Continue Trelegy 15mcg one puff daily in the morning  Use albuterol 2 puffs every 4-6 hours as needed for breakthrough sob Continue keflex for UTI Be sure to take probiotic as prescribed  Follow-up: Call if symptoms do not improve in 5-7 days or worsen

## 2021-02-02 NOTE — Progress Notes (Signed)
Carelink Summary Report / Loop Recorder 

## 2021-02-03 ENCOUNTER — Telehealth (HOSPITAL_BASED_OUTPATIENT_CLINIC_OR_DEPARTMENT_OTHER): Payer: Medicare HMO | Admitting: Nurse Practitioner

## 2021-02-03 ENCOUNTER — Other Ambulatory Visit: Payer: Self-pay

## 2021-02-03 ENCOUNTER — Ambulatory Visit: Payer: Medicare HMO | Admitting: Pulmonary Disease

## 2021-02-04 ENCOUNTER — Encounter (HOSPITAL_BASED_OUTPATIENT_CLINIC_OR_DEPARTMENT_OTHER): Payer: Self-pay | Admitting: Nurse Practitioner

## 2021-02-04 ENCOUNTER — Telehealth (INDEPENDENT_AMBULATORY_CARE_PROVIDER_SITE_OTHER): Payer: Medicare HMO | Admitting: Nurse Practitioner

## 2021-02-04 DIAGNOSIS — I10 Essential (primary) hypertension: Secondary | ICD-10-CM

## 2021-02-04 DIAGNOSIS — Z9189 Other specified personal risk factors, not elsewhere classified: Secondary | ICD-10-CM

## 2021-02-04 DIAGNOSIS — E1159 Type 2 diabetes mellitus with other circulatory complications: Secondary | ICD-10-CM | POA: Insufficient documentation

## 2021-02-04 DIAGNOSIS — F321 Major depressive disorder, single episode, moderate: Secondary | ICD-10-CM

## 2021-02-04 DIAGNOSIS — F411 Generalized anxiety disorder: Secondary | ICD-10-CM | POA: Diagnosis not present

## 2021-02-04 HISTORY — DX: Other specified personal risk factors, not elsewhere classified: Z91.89

## 2021-02-04 MED ORDER — DIPHENHYDRAMINE HCL 25 MG PO CAPS: 25.0000 mg | ORAL_CAPSULE | Freq: Three times a day (TID) | ORAL | 1 refills | Status: DC | PRN

## 2021-02-04 MED ORDER — HYDROCHLOROTHIAZIDE 25 MG PO TABS: ORAL_TABLET | ORAL | 1 refills | Status: DC

## 2021-02-04 NOTE — Assessment & Plan Note (Signed)
Mood has improved.  We will send referral to psychiatry today to help with management. See anxiety for additional information.

## 2021-02-04 NOTE — Assessment & Plan Note (Signed)
Recent COPD exacerbation likely causing elevated blood pressure readings and heart rate. While receiving treatment and recovering, plan to increase HCTZ to 25mg  once daily for AM blood pressure readings > 868 systolic. Orders to decrease dose to 12.5mg  for all systolic readings < 54.$[id number]  Recommend continuing to monitor heart rate and notify if HR exceeds 100 BPM while at rest. Possible increase related to increased albuterol use.  Her COPD exacerbation is improving, therefore she will hopefully have her BP and HR return to baseline in the near future.  She will follow-up if her systolic BP does not come down with medication change and we can consider an increase in other antihypertensive.

## 2021-02-04 NOTE — Assessment & Plan Note (Signed)
Multiple medication and food allergies with PRN need for benadryl to relieve symptoms.  This order has been changed to reflect availability of benadryl 25mg  every 8 hours as needed for allergic reaction/itching.  Patient is aware to contact the office if she is using this medication on a daily basis for re-evaluation.  Want to ensure we limit use as much as possible to avoid anticholinergic effects, but she has historically tolerated this medication very well.  Will monitor.

## 2021-02-04 NOTE — Patient Instructions (Addendum)
Please change parameters for blood pressure medication:  Hydrochlorothiazide: 25 mg (1 tab) by mouth daily for AM blood pressure readings > 810 systolic. If blood pressure < 254 systolic- please only provider 12.5mg  dose.   If blood pressures remain > 140 with increased dose after several days, please notify the office for further instructions.

## 2021-02-04 NOTE — Progress Notes (Signed)
Virtual Visit Encounter telephone visit.   I connected with  [name] [name] on 02/04/21 at  8:10 AM EST by secure audio and/or video enabled telemedicine application. I verified that I am speaking with the correct person using two identifiers.   I introduced myself as a Designer, jewellery with the practice. The limitations of evaluation and management by telemedicine discussed with the patient and the availability of in person appointments. The patient expressed verbal understanding and consent to proceed.  Participating parties in this visit include: Myself and patient  The patient is: Patient Location: Home I am: Provider Location: Office/Clinic Subjective:    CC and HPI: [name] [name] is a 80 y.o. year old female presenting for follow up of HTN. [name] [name] tells me that her blood pressures have been running high over the past several days and she is concerned. She reports that her blood pressure is monitored daily and typically very consistently normal. She has had a recent COPD exacerbation requiring steroids and antibiotics. She tells me that her BP was elevated prior to starting the steroids, but she does feel it correlates with the start of the COPD exacerbation.  She tells me her BP has been in the 160-170's / 70's with AM check.  She has also noted that her HR is running in the low 90's. This is higher than normal.   She has no worsening edema, CP, HA, weakness, palpitations.  She has had shortness of breath and cough associated with her COPD exacerbation.  She is urinating normally.   Hetal also reports that she would like to ensure that she has benadryl available for allergic reaction and urticaria. She has many medication and some food allergies and has used benadryl successfully for many years. She tells me that nothing else seems to work well for her when this occurs. She would like this prescription renewed and to reflect PRN use based on her symptoms.   Her mood is improved  significantly. She was able to spend Christmas with her son and grandchildren/great grandchildren. This was a good time for her and uplifting. She will be seeing her sister in March when she comes to visit. They will be going to spend the weekend in a hotel together in [place] to visit.  Her upcoming trip to [place] has been postponed due to her niece having cancer and a difficult time managing this. She is disappointed in the delay of the trip and circumstances, but is excited to see her sister in a few months. She is still working to find a Teacher, music. She tells me she is fine with virtual visits for this.  Past medical history, Surgical history, Family history not pertinant except as noted below, Social history, Allergies, and medications have been entered into the medical record, reviewed, and corrections made.   Review of Systems:  All review of systems negative except what is listed in the HPI  Objective:    Alert and oriented x 4 Speaking in clear sentences with no shortness of breath. She is hoarse.  No distress.  Impression and Recommendations:    Problem List Items Addressed This Visit     Generalized anxiety disorder    Anxiety and depression have improved. She is now able to get her xanax on a routine basis.  Difficulty with finding psychiatry services for her. We will send a new referral today to an outside source.  We will continue to provide xanax prescription until psychiatry can take over management.  Although this medication is strongly  discouraged in the elderly population, the benefits outweigh the risks at this time as she has been on this medication for many years and her symptoms are significantly exacerbated when it is removed.       Depression, major, single episode, moderate (HCC)    Mood has improved.  We will send referral to psychiatry today to help with management. See anxiety for additional information.      Hypertension - Primary    Recent COPD  exacerbation likely causing elevated blood pressure readings and heart rate. While receiving treatment and recovering, plan to increase HCTZ to 25mg  once daily for AM blood pressure readings > 161 systolic. Orders to decrease dose to 12.5mg  for all systolic readings < 09.$[id number]$.  Recommend continuing to monitor heart rate and notify if HR exceeds 100 BPM while at rest. Possible increase related to increased albuterol use.  Her COPD exacerbation is improving, therefore she will hopefully have her BP and HR return to baseline in the near future.  She will follow-up if her systolic BP does not come down with medication change and we can consider an increase in other antihypertensive.       Relevant Medications   hydrochlorothiazide (HYDRODIURIL) 25 MG tablet   At risk for allergic reaction to medication    Multiple medication and food allergies with PRN need for benadryl to relieve symptoms.  This order has been changed to reflect availability of benadryl 25mg  every 8 hours as needed for allergic reaction/itching.  Patient is aware to contact the office if she is using this medication on a daily basis for re-evaluation.  Want to ensure we limit use as much as possible to avoid anticholinergic effects, but she has historically tolerated this medication very well.  Will monitor.       Relevant Medications   diphenhydrAMINE (BENADRYL) 25 mg capsule    orders and follow up as documented in EMR, reviewed medications and side effects in detail I discussed the assessment and treatment plan with the patient. The patient was provided an opportunity to ask questions and all were answered. The patient agreed with the plan and demonstrated an understanding of the instructions.   The patient was advised to call back or seek an in-person evaluation if the symptoms worsen or if the condition fails to improve as anticipated.  Follow-Up: in 3 months  I provided 20 minutes of non-face-to-face interaction with this  non face-to-face encounter including intake, same-day documentation, and chart review.   557, NP , DNP, AGNP-c [place] [facility] [phone] (fax)

## 2021-02-04 NOTE — Assessment & Plan Note (Signed)
Anxiety and depression have improved. She is now able to get her xanax on a routine basis.  Difficulty with finding psychiatry services for her. We will send a new referral today to an outside source.  We will continue to provide xanax prescription until psychiatry can take over management.  Although this medication is strongly discouraged in the elderly population, the benefits outweigh the risks at this time as she has been on this medication for many years and her symptoms are significantly exacerbated when it is removed.

## 2021-02-14 ENCOUNTER — Ambulatory Visit (INDEPENDENT_AMBULATORY_CARE_PROVIDER_SITE_OTHER)
Admission: RE | Admit: 2021-02-14 | Discharge: 2021-02-14 | Disposition: A | Discharge status: Home / Self Care | Payer: Medicare HMO | Source: Ambulatory Visit | Attending: Pulmonary Disease | Admitting: Pulmonary Disease

## 2021-02-14 ENCOUNTER — Other Ambulatory Visit: Payer: Self-pay

## 2021-02-14 DIAGNOSIS — R911 Solitary pulmonary nodule: Secondary | ICD-10-CM | POA: Diagnosis not present

## 2021-02-21 ENCOUNTER — Encounter (HOSPITAL_BASED_OUTPATIENT_CLINIC_OR_DEPARTMENT_OTHER): Payer: Self-pay | Admitting: Nurse Practitioner

## 2021-02-21 ENCOUNTER — Telehealth (HOSPITAL_BASED_OUTPATIENT_CLINIC_OR_DEPARTMENT_OTHER): Payer: Self-pay

## 2021-02-21 ENCOUNTER — Other Ambulatory Visit: Payer: Self-pay | Admitting: Adult Health

## 2021-02-21 NOTE — Telephone Encounter (Signed)
Patient called to state that she is having elevated BP today She states is unaware is she is given her mediation (hctz) as prescribed She denies any headaches, chest pain, lightheadedness, or dizziness She states that every morning her BP is typically 170-180 every morning and seems to normalize when her BP is checked in the evening.  Patient states she was told by [facility] that she is only allowed to take her BP medications once daily and is concerned that she needs more medications or medications more frequently I advised patient that I would forward her concerns to Worthy Keeler, DNP and if medication adjustments needed to be made then we would alert [facility] Patient also advised that if she develops lightheadedness, dizziness, chest pain, or Shortness of breath she needs to alert nurses at [facility] Patient verbalized understanding and is agreeable to plan Will forward to Worthy Keeler, DNP for advisement.

## 2021-02-23 ENCOUNTER — Telehealth: Payer: Self-pay | Admitting: Cardiology

## 2021-02-23 NOTE — Telephone Encounter (Signed)
Recommend scheduling in pharmacy hypertension clinic.  Would check BP daily and bring log to appointment

## 2021-02-23 NOTE — Telephone Encounter (Signed)
Returned call to patient. She states that she lives in [facility] assisted [facility] and that they check her BP in the AM before her meds and SBP is in the 170s. So they give her 25 mg of her prn HCTZ in addition to her scheduled meds which include amlodipine 5 mg QD, losartan 100 mg QD, and hydralazine 25 mg BID. She states that they re-check her BP about 3 hours later and it is down to SBP of 135. She states that in the PM before dinner that SBP is back up to the 170s. Patient is concerned about her BP being to high. She denies having any Sx. We discussed limiting salt and having BP checked after she has been seated rested for at least 10 minutes with her feet flat on the floor. Instructed patient to continue to monitor BP and let us know if she develops any Sx. Made her aware that I would forward to Dr. Gardiner Rhyme for review. She verbalized understanding and thanked me fore the call.

## 2021-02-23 NOTE — Telephone Encounter (Signed)
Patient was returning call 

## 2021-02-23 NOTE — Telephone Encounter (Signed)
Left message for patient to call back  

## 2021-02-23 NOTE — Telephone Encounter (Signed)
°  Pt c/o BP issue: STAT if pt c/o blurred vision, one-sided weakness or slurred speech  1. What are your last 5 BP readings?   Running around 170/60   2. Are you having any other symptoms (ex. Dizziness, headache, blurred vision, passed out)? Denies any other symptoms  3. What is your BP issue? Concerned that her BP is running too high

## 2021-02-24 NOTE — Telephone Encounter (Signed)
Spoke with pt, Follow up scheduled  

## 2021-02-28 ENCOUNTER — Other Ambulatory Visit: Payer: Self-pay

## 2021-02-28 ENCOUNTER — Telehealth (HOSPITAL_BASED_OUTPATIENT_CLINIC_OR_DEPARTMENT_OTHER): Payer: Self-pay | Admitting: Nurse Practitioner

## 2021-02-28 ENCOUNTER — Ambulatory Visit (INDEPENDENT_AMBULATORY_CARE_PROVIDER_SITE_OTHER): Payer: Medicare HMO

## 2021-02-28 ENCOUNTER — Telehealth (INDEPENDENT_AMBULATORY_CARE_PROVIDER_SITE_OTHER): Payer: Medicare HMO | Admitting: Pulmonary Disease

## 2021-02-28 ENCOUNTER — Ambulatory Visit: Payer: Medicare HMO | Admitting: Pulmonary Disease

## 2021-02-28 DIAGNOSIS — R911 Solitary pulmonary nodule: Secondary | ICD-10-CM

## 2021-02-28 DIAGNOSIS — R55 Syncope and collapse: Secondary | ICD-10-CM | POA: Diagnosis not present

## 2021-02-28 LAB — CUP PACEART REMOTE DEVICE CHECK
Date Time Interrogation Session: 20230122234645
Implantable Pulse Generator Implant Date: 20191002

## 2021-02-28 MED ORDER — AMLODIPINE BESYLATE 10 MG PO TABS: 10.0000 mg | ORAL_TABLET | Freq: Every day | ORAL | 0 refills | Status: DC

## 2021-02-28 NOTE — Telephone Encounter (Signed)
Called patient to inquire about symptoms, patient did not answer Left a voicemail with instructions of increasing amlodipine to 10 mg qd and to contact cardiology to follow up Will fax over medication change to [facility]

## 2021-02-28 NOTE — Telephone Encounter (Signed)
Received a fax from [facility] that was sent on 1/20 @ 4:46. It stated that pt was having elevated BP 224/68 P96 Pt took 25 mg Hydralazine that morning. Please advise.

## 2021-02-28 NOTE — Progress Notes (Signed)
Virtual Visit via Video Note  I connected with [name] [name] on 02/28/21 at 11:00 AM EST by a video enabled telemedicine application and verified that I am speaking with the correct person using two identifiers.  Location: Patient: Home Provider: Office   I discussed the limitations of evaluation and management by telemedicine and the availability of in person appointments. The patient expressed understanding and agreed to proceed.  History of Present Illness:  This is a 81 year old female, patient of Dr. Ander Slade, referred for right lower lobe pulmonary nodule.  She is a former smoker quit in 1988.  She had a CT scan of the chest completed in November 2020 that is visible in PACS.  This showed a 4 mm right lower lobe pulmonary nodule.  She had CT scan follow-up that was completed.  CT follow-up imaging was completed on 07/22/2020.  This CT scan of the chest revealed a 7 mm right lower lobe pulmonary nodule that was previously 4 mm and had increased in size and now has slight spiculated margins.  She had this followed by a nuclear medicine pet image on 08/19/2020.  Nuclear medicine pet imaging shows no significant hypermetabolism however the lesion itself is smaller than the lower limit for size related to PET scans.  From a respiratory standpoint she does feel short of breath with exertion.  Her mobility is aided by a rolling walker.  She is currently using as needed albuterol.  Has been told she may have COPD in the past.  She does have PFTs scheduled for today which have not been completed yet.   OV 12/20/2020: Here today for follow-up regarding COPD, shortness of breath.  She does have OSA follows with Dr. Jenetta Downer.  She enjoys using her Trelegy.  However this is costly.  She is unable to afford it.  She would like to stay on it if possible.  We will fill out the financial aid application to help her.  She also has a planned trip to [place].  She has experienced high altitude sickness before.  Associated  with headache and vomiting.  She is worried about this happening again.  We talked about prophylactic measures today.   OV 02/28/2021: Here today for video visit to discuss recent CT scan of the chest.  Patient had a CT scan of the chest January 2023 which showed a right lower lobe pulmonary nodule that has slightly increased in size.  It was about 6 mm in June now about 6.5 mm and slightly more solid.  Its slow growth is obviously concerning for potential underlying malignancy however this is still a sub-8 mm size lesion.  She still has significant shortness of breath with exertion.  Currently managed with Trelegy inhaler.  Observations/Objective: Connected via video visit.  Was able to come versus and patient able to speak in complete sentences.  She did not appear in any distress on video.  CT scan of the chest 02/14/2021: Revealed a solid lesion slightly increased in size about a half a millimeter increase, mean dry volume at 5.8 mm from previous mean dry volume of 4.8 mm on previous imaging largest cross-section appears to be about 6.5 mm now.  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 We talked  about the utility of undergoing a procedure for small subcentimeter pulmonary nodules as well as the risk of going off to sleep for general anesthesia for a biopsy. I think it is probably best that we hold off on any intervention at this time and continue to observe the lesion. If there is continued steady growth of the lesion then I think we need to consider either empiric treatments or consider biopsy of the lesion. Patient is agreeable to this plan. As for her COPD we talked about her maintaining on her triple therapy inhaler regimen  plus prn  Albuterol. Repeat noncontrasted CT scan of the chest in 6 months.  Follow Up Instructions:  Repeat CT chest in 6 months Follow-up with me in clinic in 6 months or Eric Form, APP  I discussed the assessment and treatment plan with the patient. The patient was provided an opportunity to ask questions and all were answered. The patient agreed with the plan and demonstrated an understanding of the instructions.   The patient was advised to call back or seek an in-person evaluation if the symptoms worsen or if the condition fails to improve as anticipated.  I provided 32 minutes of non-face-to-face time during this encounter.  Garner Nash, DO

## 2021-03-01 ENCOUNTER — Ambulatory Visit (INDEPENDENT_AMBULATORY_CARE_PROVIDER_SITE_OTHER): Payer: Medicare HMO | Admitting: Pharmacist Clinician (PhC)/ Clinical Pharmacy Specialist

## 2021-03-01 ENCOUNTER — Telehealth: Payer: Self-pay | Admitting: Cardiology

## 2021-03-01 VITALS — BP 158/80 | HR 89 | Resp 17 | Ht 65.0 in | Wt 250.4 lb

## 2021-03-01 DIAGNOSIS — I10 Essential (primary) hypertension: Secondary | ICD-10-CM | POA: Diagnosis not present

## 2021-03-01 MED ORDER — OLMESARTAN MEDOXOMIL 40 MG PO TABS: 40.0000 mg | ORAL_TABLET | Freq: Every day | ORAL | 3 refills | Status: DC

## 2021-03-01 NOTE — Assessment & Plan Note (Signed)
The patients blood pressure in clinic today was 158/80 with her other readings at [facility] typically being in the 848-592N systolic. Her amlodipine was increased from 5 mg to 10 mg on 02/28/2021. Told patient to keep taking the amlodipine 10 mg. Counseled patient to watch out for swelling in her legs and feet with the increased dose of amlodipine from 5 to 10 mg. If she notices more than normal, give [place] a call and we will have to decrease the dose back to 5 mg of amlodipine. Will also switch her losartan 100 mg daily to olmesartan 40 mg daily today for increased blood pressure lowering. Getting lab work today and will check labs at her next visit on February 23 @ 3:00. Asked the patient to bring her blood pressure readings with her to the next visit. Continue taking the HCTZ 25 mg daily and hydralazine 25 mg BID.

## 2021-03-01 NOTE — Telephone Encounter (Signed)
Pt c/o medication issue:  1. Name of Medication: olmesartan (BENICAR) 40 MG tablet  2. How are you currently taking this medication (dosage and times per day)? Take 1 tablet (40 mg total) by mouth daily.  3. Are you having a reaction (difficulty breathing--STAT)? NO  4. What is your medication issue? Rx care would like to verify that we are DC losartan and pt is starting on this medication, please clarify

## 2021-03-01 NOTE — Telephone Encounter (Signed)
Returned call to RX care who was calling to verify patients medication changed from today- switching Losartan to Olmesartan 40mg  daily and increasing Amlodipine to 10mg  daily.   [name] that per recent note from PharmD:  The patients blood pressure in clinic today was 158/80 with her other readings at [facility] typically being in the 967-591M systolic. Her amlodipine was increased from 5 mg to 10 mg on 02/28/2021. Told patient to keep taking the amlodipine 10 mg. Counseled patient to watch out for swelling in her legs and feet with the increased dose of amlodipine from 5 to 10 mg. If she notices more than normal, give [place] a call and we will have to decrease the dose back to 5 mg of amlodipine. Will also switch her losartan 100 mg daily to olmesartan 40 mg daily today for increased blood pressure lowering. Getting lab work today and will check labs at her next visit on February 23 @ 3:00. Asked the patient to bring her blood pressure readings with her to the next visit. Continue taking the HCTZ 25 mg daily and hydralazine 25 mg BID.   I have updated medication list and removed the Losartan. [name] with Rx care if aware and verbalized understanding.

## 2021-03-01 NOTE — Progress Notes (Signed)
03/01/2021 [name] [name] December 24, 1940 [id number]   HPI:  [name] [name] is an 81 y/o female with a history of hypertension, COPD, OSA, CAD, GERD, depression, and former smoker. She last followed up for her HTN with Marnee Spring NP on 02/04/2021 via virtual televisit. At this time, the patient was concerned with high blood pressures that had been running high over the past several days. She reported pressures of 160-170s/70s in the morning and HR in the 90s, both of which were higher than normal. She reported a recent COPD exacerbation requiring steroids and antibiotics which she thought were increasing her BP. At this visit with Dr. Donnetta Hutching, she was told to increase HCTZ to 25 mg daily if SBP > 130 and to take 12.5 mg HCTZ if SBP < 130 and to monitor HR if it exceeds 100 bpm.   The patient called on 02/21/2021 and 02/23/2021 worried about her high blood pressure readings that were in the 170-180s in the morning. Patient stated she lives at [facility] and was told by them she is only allowed to take her BP medications once daily and she thought she may need more medications. They gave her 25 mg of her as needed HCTZ in addition to her scheduled meds losartan 100 mg daily, amlodipine 5 mg daily, and hydralazine 25 mg BID. She stated that they re-checked her BP about 3 hours later and it was down to SBP of 135. She states that in the PM before dinner that SBP is back up to the 170s. This was forwarded to Dr. Gardiner Rhyme where he recommended a follow-up with the pharmacy HTN clinic. On 02/28/2021, Marnee Spring NP was made aware that her BP was 224/68 in the morning. That afternoon, a message was left for [facility] to increase her amlodipine to 10 mg daily.   Patient reports her blood pressure is out of whack. Has been as high as 220 and they give her medicine and brings it down but it is still high in the 150-170s. The diastolic is almost always normal. Her son is with her and reports that her blood pressure is  constantly up and down. Reports shortness of breath when walking and after she sits down, she is able to catch her breath. Wears compression stockings every day.   PMH: Hypertension - hydralazine 25 mg BID, losartan 100 mg QD, HCTZ 25 mg QD, amlodipine 10 mg QD COPD - Trelegy, albuterol  CAD - atorvastatin 10 mg, aspirin 81 mg, losartan 100 mg Depression - paroxetine, mirtazapine   Blood pressure goal < 130/80   Current Antihypertensives: Losartan 100 mg daily Amlodipine 10 mg daily HCTZ 25 mg daily if SBP > 130, HCTZ 12.5 mg if SBP < 130 Hydralazine 25 mg BID  Clonidine 0.52m every hour when BP is >190 (added 02/25/2021 - patient has only taken it once) Family Hx  Neither mom nor dad had high blood pressure or heart disease; sister has controlled HTN; two sons have high blood pressure, one son does not; grandchildren are healthy   Social Hx  No tobacco use, quit 35 years ago; no alcohol; no sodas, drinks unsweet tea at lunch and dinner   Diet  [facility] cooks most meals, meats, veggies, salads, sandwiches for lunch mostly, fish and chicken at dinner, breakfast - waffles, syrup, butter, jimmy dean breakfast croissant   Exercise: No formal exercise, no PT; walks around the assisted living facility   In-Clinic BP Reading 158/80  Intolerances  Azithromycin - anaphylaxis, hives  Bee venom - anaphylaxis  Erythromycin base - anaphylaxis Metronidazole - anaphylaxis Nitrofurantoin - anaphylaxis Penicillins (childhood) - anaphylaxis, hives Sulfa antibiotics (childhood) - anaphylaxis, hives  Iodinated contrast media - not specified  Penicillin G - hives  Codeine - rash Erythromycin - rash, hives Fosfomycin - rash Levofloxacin - rash  Nitrofurantoin - hives Percocet - rash Cymbalta - not specified, intolerance   Labs: 12/29/2019: Na 138, K 3.8, BUN 15, SCr 0.91, eGFR 60, Glu 138 12/29/2019: TC 133, TG 223, HDL 42, LDL 55  12/29/2019: A1c 7.0   Wt Readings from Last 3  Encounters:  03/01/21 250 lb 6.4 oz (113.6 kg)  12/21/20 250 lb (113.4 kg)  12/20/20 253 lb 3.2 oz (114.9 kg)   BP Readings from Last 3 Encounters:  03/01/21 (!) 158/80  01/11/21 (!) 177/70  12/21/20 135/73   Pulse Readings from Last 3 Encounters:  03/01/21 89  01/11/21 (!) 111  12/21/20 (!) 101    Current Outpatient Medications  Medication Sig Dispense Refill   albuterol (VENTOLIN HFA) 108 (90 Base) MCG/ACT inhaler Inhale 1-2 puffs into the lungs every 4 (four) hours as needed for wheezing or shortness of breath. 18 g 1   ALPRAZolam (XANAX) 0.25 MG tablet TAKE (1) TABLET BY MOUTH TWICE DAILY AT 11AM & 5PM FOR ANXIETY. 60 tablet 0   AMBULATORY NON FORMULARY MEDICATION One four wheel rolling walker with seat and hand brakes. Brand/style covered by insurance. Medically necessary for ICD-10: R26.81, Z74.1 1 Units 0   amLODipine (NORVASC) 10 MG tablet Take 1 tablet (10 mg total) by mouth daily. 90 tablet 0   ARTHRITIS PAIN RELIEVING 0.075 % topical cream Apply topically.     aspirin EC 81 MG tablet Take 81 mg by mouth daily. Swallow whole.     atorvastatin (LIPITOR) 10 MG tablet Take 10 mg by mouth daily.     Bacillus Coagulans-Inulin (PROBIOTIC FORMULA) 1-250 BILLION-MG CAPS Take by mouth.     camphor-menthol (SARNA) lotion Apply 1 application topically as needed for itching. 222 mL 0   cetirizine (ZYRTEC) 10 MG tablet Take 10 mg by mouth daily.     cloNIDine (CATAPRES) 0.2 MG tablet Take 0.2 mg by mouth daily. Take 1 tablet by mouth once every hour as needed for bp greater than 190     diphenhydrAMINE (BENADRYL) 25 mg capsule Take 1 capsule (25 mg total) by mouth every 8 (eight) hours as needed for itching or allergies. 30 capsule 1   EPINEPHrine 0.3 mg/0.3 mL IJ SOAJ injection SMARTSIG:0.3 Milligram(s) IM Once PRN     estradiol (ESTRACE) 0.1 MG/GM vaginal cream Place 0.5g nightly for two weeks then twice a week after 30 g 11   famotidine (PEPCID) 20 MG tablet Take 1 tablet (20 mg  total) by mouth daily as needed for heartburn or indigestion. 90 tablet 3   hydrALAZINE (APRESOLINE) 25 MG tablet Take 25 mg by mouth 2 (two) times daily.     hydrochlorothiazide (HYDRODIURIL) 25 MG tablet 2m daily for BP greater than 1161systolic. 12.549mfor BP less than 13096ystolic. 90 tablet 1   Lactase 9000 units TABS Take one tablet with the first bite of meals containing milk or dairy products. 90 tablet 11   losartan (COZAAR) 100 MG tablet Take 100 mg by mouth daily.     mirtazapine (REMERON) 7.5 MG tablet Take 1 tablet (7.5 mg total) by mouth at bedtime. For depression and anxiety symptoms 30 tablet 2   olmesartan (BENICAR) 40 MG tablet  Take 1 tablet (40 mg total) by mouth daily. 90 tablet 3   pantoprazole (PROTONIX) 40 MG tablet TAKE ONE TABLET BY MOUTH EVERY MORNING FOR GERD 30 tablet 0   PARoxetine (PAXIL) 40 MG tablet Take 40 mg by mouth daily.     Probiotic Product (PROBIOTIC ACIDOPHILUS BEADS) CAPS Take 250 mg by mouth 2 (two) times daily.     TRELEGY ELLIPTA 100-62.5-25 MCG/ACT AEPB INHALE 1 PUFF INTO THE LUNGS DAILY. ** RINSE MOUTH AFTER USE** 60 each 0   No current facility-administered medications for this visit.    Allergies  Allergen Reactions   Azithromycin Anaphylaxis and Hives   Bee Venom Anaphylaxis   Erythromycin Base Anaphylaxis   Metronidazole Anaphylaxis   Nitrofurantoin Macrocrystal Anaphylaxis   Penicillins Anaphylaxis and Hives    childhood   Sulfa Antibiotics Anaphylaxis and Hives    Childhood   Cymbalta [Duloxetine Hcl]    Ivp Dye [Iodinated Contrast Media]    Penicillin G Hives   Codeine Rash   Erythromycin Rash and Hives   Fosfomycin Rash   Levofloxacin Rash   Nitrofurantoin Hives   Percocet [Oxycodone-Acetaminophen] Rash    Past Medical History:  Diagnosis Date   Allergy    Anxiety    Arthritis    Blood transfusion without reported diagnosis    Cancer ([place])    Melanoma on Great toe   Cataract    COPD (chronic obstructive pulmonary  disease) (HCC)    Depression    GERD (gastroesophageal reflux disease)    Hyperlipidemia    Hypertension    Osteoporosis    Sleep apnea     Blood pressure (!) 158/80, pulse 89, resp. rate 17, height '5\' 5"'  (1.651 m), weight 250 lb 6.4 oz (113.6 kg), SpO2 93 %.  Hypertension The patients blood pressure in clinic today was 158/80 with her other readings at [facility] typically being in the [id number] systolic. Her amlodipine was increased from 5 mg to 10 mg on 02/28/2021. Told patient to keep taking the amlodipine 10 mg. Counseled patient to watch out for swelling in her legs and feet with the increased dose of amlodipine from 5 to 10 mg. If she notices more than normal, give [place] a call and we will have to decrease the dose back to 5 mg of amlodipine. Will also switch her losartan 100 mg daily to olmesartan 40 mg daily today for increased blood pressure lowering. Getting lab work today and will check labs at her next visit on February 23 @ 3:00. Asked the patient to bring her blood pressure readings with her to the next visit. Continue taking the HCTZ 25 mg daily and hydralazine 25 mg BID.   Glee Arvin PharmD Candidate [place] [employer], Class of 2023  I was with patient and student throughout entire visit and agree with above assessment and plan.    Tommy Medal PharmD CPP [facility] [address] [place], [place] [address] [phone]

## 2021-03-01 NOTE — Patient Instructions (Signed)
Return for a a follow up appointment Thursday Feb 23 at 3:00 pm  Check your blood pressure at home daily and keep record of the readings.  Take your BP meds as follows:  Stop losartan  Start olmesartan 40 mg once daily  Continue with all other medications.    Bring all of your meds, your BP cuff and your record of home blood pressures to your next appointment.  Exercise as youre able, try to walk approximately 30 minutes per day.  Keep salt intake to a minimum, especially watch canned and prepared boxed foods.  Eat more fresh fruits and vegetables and fewer canned items.  Avoid eating in fast food restaurants.    HOW TO TAKE YOUR BLOOD PRESSURE: Rest 5 minutes before taking your blood pressure.  Dont smoke or drink caffeinated beverages for at least 30 minutes before. Take your blood pressure before (not after) you eat. Sit comfortably with your back supported and both feet on the floor (dont cross your legs). Elevate your arm to heart level on a table or a desk. Use the proper sized cuff. It should fit smoothly and snugly around your bare upper arm. There should be enough room to slip a fingertip under the cuff. The bottom edge of the cuff should be 1 inch above the crease of the elbow. Ideally, take 3 measurements at one sitting and record the average.

## 2021-03-02 LAB — BASIC METABOLIC PANEL
BUN/Creatinine Ratio: 20 (ref 12–28)
BUN: 16 mg/dL (ref 8–27)
CO2: 22 mmol/L (ref 20–29)
Calcium: 9.7 mg/dL (ref 8.7–10.3)
Chloride: 101 mmol/L (ref 96–106)
Creatinine, Ser: 0.79 mg/dL (ref 0.57–1.00)
Glucose: 164 mg/dL — ABNORMAL HIGH (ref 70–99)
Potassium: 4.3 mmol/L (ref 3.5–5.2)
Sodium: 144 mmol/L (ref 134–144)
eGFR: 76 mL/min/{1.73_m2} (ref >59)

## 2021-03-09 ENCOUNTER — Encounter (HOSPITAL_BASED_OUTPATIENT_CLINIC_OR_DEPARTMENT_OTHER): Payer: Self-pay

## 2021-03-09 ENCOUNTER — Other Ambulatory Visit: Payer: Self-pay

## 2021-03-09 ENCOUNTER — Inpatient Hospital Stay (HOSPITAL_BASED_OUTPATIENT_CLINIC_OR_DEPARTMENT_OTHER)
Admission: EM | Admit: 2021-03-09 | Discharge: 2021-03-13 | DRG: 177 | Disposition: A | Discharge status: Nursing Home (Includes ICF) | Payer: Medicare HMO | Attending: Internal Medicine | Admitting: Internal Medicine

## 2021-03-09 ENCOUNTER — Emergency Department (HOSPITAL_BASED_OUTPATIENT_CLINIC_OR_DEPARTMENT_OTHER): Payer: Medicare HMO

## 2021-03-09 DIAGNOSIS — J9811 Atelectasis: Secondary | ICD-10-CM | POA: Diagnosis present

## 2021-03-09 DIAGNOSIS — E119 Type 2 diabetes mellitus without complications: Secondary | ICD-10-CM | POA: Diagnosis present

## 2021-03-09 DIAGNOSIS — R0602 Shortness of breath: Secondary | ICD-10-CM

## 2021-03-09 DIAGNOSIS — U071 COVID-19: Secondary | ICD-10-CM | POA: Diagnosis present

## 2021-03-09 DIAGNOSIS — Z825 Family history of asthma and other chronic lower respiratory diseases: Secondary | ICD-10-CM | POA: Diagnosis not present

## 2021-03-09 DIAGNOSIS — I10 Essential (primary) hypertension: Secondary | ICD-10-CM | POA: Diagnosis present

## 2021-03-09 DIAGNOSIS — J209 Acute bronchitis, unspecified: Secondary | ICD-10-CM | POA: Diagnosis present

## 2021-03-09 DIAGNOSIS — E8770 Fluid overload, unspecified: Secondary | ICD-10-CM | POA: Diagnosis not present

## 2021-03-09 DIAGNOSIS — G4733 Obstructive sleep apnea (adult) (pediatric): Secondary | ICD-10-CM | POA: Diagnosis present

## 2021-03-09 DIAGNOSIS — Z85820 Personal history of malignant melanoma of skin: Secondary | ICD-10-CM | POA: Diagnosis not present

## 2021-03-09 DIAGNOSIS — Z885 Allergy status to narcotic agent status: Secondary | ICD-10-CM

## 2021-03-09 DIAGNOSIS — Z8249 Family history of ischemic heart disease and other diseases of the circulatory system: Secondary | ICD-10-CM | POA: Diagnosis not present

## 2021-03-09 DIAGNOSIS — E860 Dehydration: Secondary | ICD-10-CM | POA: Diagnosis present

## 2021-03-09 DIAGNOSIS — Z810 Family history of intellectual disabilities: Secondary | ICD-10-CM | POA: Diagnosis not present

## 2021-03-09 DIAGNOSIS — Z882 Allergy status to sulfonamides status: Secondary | ICD-10-CM

## 2021-03-09 DIAGNOSIS — R7989 Other specified abnormal findings of blood chemistry: Secondary | ICD-10-CM | POA: Diagnosis not present

## 2021-03-09 DIAGNOSIS — J440 Chronic obstructive pulmonary disease with acute lower respiratory infection: Secondary | ICD-10-CM | POA: Diagnosis present

## 2021-03-09 DIAGNOSIS — I959 Hypotension, unspecified: Secondary | ICD-10-CM | POA: Diagnosis not present

## 2021-03-09 DIAGNOSIS — I2510 Atherosclerotic heart disease of native coronary artery without angina pectoris: Secondary | ICD-10-CM | POA: Diagnosis present

## 2021-03-09 DIAGNOSIS — Z8261 Family history of arthritis: Secondary | ICD-10-CM | POA: Diagnosis not present

## 2021-03-09 DIAGNOSIS — J449 Chronic obstructive pulmonary disease, unspecified: Secondary | ICD-10-CM | POA: Diagnosis not present

## 2021-03-09 DIAGNOSIS — Z880 Allergy status to penicillin: Secondary | ICD-10-CM

## 2021-03-09 DIAGNOSIS — Z7951 Long term (current) use of inhaled steroids: Secondary | ICD-10-CM

## 2021-03-09 DIAGNOSIS — Z881 Allergy status to other antibiotic agents status: Secondary | ICD-10-CM

## 2021-03-09 DIAGNOSIS — J441 Chronic obstructive pulmonary disease with (acute) exacerbation: Secondary | ICD-10-CM | POA: Diagnosis present

## 2021-03-09 DIAGNOSIS — Z79899 Other long term (current) drug therapy: Secondary | ICD-10-CM

## 2021-03-09 DIAGNOSIS — D509 Iron deficiency anemia, unspecified: Secondary | ICD-10-CM | POA: Diagnosis present

## 2021-03-09 DIAGNOSIS — K219 Gastro-esophageal reflux disease without esophagitis: Secondary | ICD-10-CM | POA: Diagnosis present

## 2021-03-09 DIAGNOSIS — Z96653 Presence of artificial knee joint, bilateral: Secondary | ICD-10-CM | POA: Diagnosis present

## 2021-03-09 DIAGNOSIS — Z87891 Personal history of nicotine dependence: Secondary | ICD-10-CM

## 2021-03-09 DIAGNOSIS — J189 Pneumonia, unspecified organism: Secondary | ICD-10-CM

## 2021-03-09 DIAGNOSIS — I82451 Acute embolism and thrombosis of right peroneal vein: Secondary | ICD-10-CM | POA: Diagnosis present

## 2021-03-09 DIAGNOSIS — J9601 Acute respiratory failure with hypoxia: Secondary | ICD-10-CM

## 2021-03-09 DIAGNOSIS — Z888 Allergy status to other drugs, medicaments and biological substances status: Secondary | ICD-10-CM

## 2021-03-09 DIAGNOSIS — N179 Acute kidney failure, unspecified: Secondary | ICD-10-CM | POA: Diagnosis present

## 2021-03-09 HISTORY — DX: COVID-19: U07.1

## 2021-03-09 LAB — HEPATIC FUNCTION PANEL
ALT: 14 U/L (ref 0–44)
AST: 20 U/L (ref 15–41)
Albumin: 3.9 g/dL (ref 3.5–5.0)
Alkaline Phosphatase: 101 U/L (ref 38–126)
Bilirubin, Direct: 0.1 mg/dL (ref 0.0–0.2)
Indirect Bilirubin: 0.4 mg/dL (ref 0.3–0.9)
Total Bilirubin: 0.5 mg/dL (ref 0.3–1.2)
Total Protein: 7.2 g/dL (ref 6.5–8.1)

## 2021-03-09 LAB — BASIC METABOLIC PANEL
Anion gap: 9 (ref 5–15)
BUN: 11 mg/dL (ref 8–23)
CO2: 27 mmol/L (ref 22–32)
Calcium: 9.3 mg/dL (ref 8.9–10.3)
Chloride: 100 mmol/L (ref 98–111)
Creatinine, Ser: 0.82 mg/dL (ref 0.44–1.00)
GFR, Estimated: >60 mL/min (ref >60)
Glucose, Bld: 142 mg/dL — ABNORMAL HIGH (ref 70–99)
Potassium: 3.7 mmol/L (ref 3.5–5.1)
Sodium: 136 mmol/L (ref 135–145)

## 2021-03-09 LAB — VITAMIN B12: Vitamin B-12: 288 pg/mL (ref 180–914)

## 2021-03-09 LAB — CBC WITH DIFFERENTIAL/PLATELET
Abs Immature Granulocytes: 0.04 10*3/uL (ref 0.00–0.07)
Basophils Absolute: 0 10*3/uL (ref 0.0–0.1)
Basophils Relative: 0 %
Eosinophils Absolute: 0.1 10*3/uL (ref 0.0–0.5)
Eosinophils Relative: 1 %
HCT: 30.8 % — ABNORMAL LOW (ref 36.0–46.0)
Hemoglobin: 8.8 g/dL — ABNORMAL LOW (ref 12.0–15.0)
Immature Granulocytes: 0 %
Lymphocytes Relative: 9 %
Lymphs Abs: 0.9 10*3/uL (ref 0.7–4.0)
MCH: 19.6 pg — ABNORMAL LOW (ref 26.0–34.0)
MCHC: 28.6 g/dL — ABNORMAL LOW (ref 30.0–36.0)
MCV: 68.4 fL — ABNORMAL LOW (ref 80.0–100.0)
Monocytes Absolute: 1.1 10*3/uL — ABNORMAL HIGH (ref 0.1–1.0)
Monocytes Relative: 12 %
Neutro Abs: 7 10*3/uL (ref 1.7–7.7)
Neutrophils Relative %: 78 %
Platelets: 336 10*3/uL (ref 150–400)
RBC: 4.5 MIL/uL (ref 3.87–5.11)
RDW: 18.8 % — ABNORMAL HIGH (ref 11.5–15.5)
WBC: 9.1 10*3/uL (ref 4.0–10.5)
nRBC: 0 % (ref 0.0–0.2)

## 2021-03-09 LAB — IRON AND TIBC
Iron: 29 ug/dL (ref 28–170)
Saturation Ratios: 6 % — ABNORMAL LOW (ref 10.4–31.8)
TIBC: 451 ug/dL — ABNORMAL HIGH (ref 250–450)
UIBC: 422 ug/dL

## 2021-03-09 LAB — RETICULOCYTES
Immature Retic Fract: 24.3 % — ABNORMAL HIGH (ref 2.3–15.9)
RBC.: 4.49 MIL/uL (ref 3.87–5.11)
Retic Count, Absolute: 73.6 10*3/uL (ref 19.0–186.0)
Retic Ct Pct: 1.6 % (ref 0.4–3.1)

## 2021-03-09 LAB — LACTATE DEHYDROGENASE: LDH: 145 U/L (ref 98–192)

## 2021-03-09 LAB — LACTIC ACID, PLASMA
Lactic Acid, Venous: 1.3 mmol/L (ref 0.5–1.9)
Lactic Acid, Venous: 1.1 mmol/L (ref 0.5–1.9)

## 2021-03-09 LAB — BRAIN NATRIURETIC PEPTIDE: B Natriuretic Peptide: 50.3 pg/mL (ref 0.0–100.0)

## 2021-03-09 LAB — RESP PANEL BY RT-PCR (FLU A&B, COVID) ARPGX2
Influenza A by PCR: NEGATIVE
Influenza B by PCR: NEGATIVE
SARS Coronavirus 2 by RT PCR: POSITIVE — AB

## 2021-03-09 LAB — TROPONIN I (HIGH SENSITIVITY)
Troponin I (High Sensitivity): 11 ng/L (ref <18)
Troponin I (High Sensitivity): 13 ng/L (ref <18)

## 2021-03-09 LAB — TRIGLYCERIDES: Triglycerides: 83 mg/dL (ref <150)

## 2021-03-09 LAB — FERRITIN: Ferritin: 12 ng/mL (ref 11–307)

## 2021-03-09 LAB — FOLATE: Folate: 31.1 ng/mL (ref >5.9)

## 2021-03-09 LAB — D-DIMER, QUANTITATIVE: D-Dimer, Quant: 1.6 ug/mL-FEU — ABNORMAL HIGH (ref 0.00–0.50)

## 2021-03-09 LAB — PROCALCITONIN: Procalcitonin: <0.1 ng/mL

## 2021-03-09 LAB — C-REACTIVE PROTEIN: CRP: 3.6 mg/dL — ABNORMAL HIGH (ref <1.0)

## 2021-03-09 LAB — OCCULT BLOOD X 1 CARD TO LAB, STOOL: Fecal Occult Bld: NEGATIVE

## 2021-03-09 LAB — FIBRINOGEN: Fibrinogen: 414 mg/dL (ref 210–475)

## 2021-03-09 MED ORDER — SODIUM CHLORIDE 0.9 % IV BOLUS
500.0000 mL | Freq: Once | INTRAVENOUS | Status: AC
  Administered 2021-03-09: 500 mL via INTRAVENOUS

## 2021-03-09 MED ORDER — SODIUM CHLORIDE 0.9 % IV SOLN
1.0000 g | Freq: Once | INTRAVENOUS | Status: AC
  Administered 2021-03-09: 1 g via INTRAVENOUS
  Filled 2021-03-09: qty 10

## 2021-03-09 MED ORDER — SODIUM CHLORIDE 0.9 % IV SOLN
100.0000 mg | Freq: Every day | INTRAVENOUS | Status: DC
  Administered 2021-03-10: 100 mg via INTRAVENOUS
  Filled 2021-03-09 (×2): qty 20

## 2021-03-09 MED ORDER — SODIUM CHLORIDE 0.9 % IV SOLN
200.0000 mg | Freq: Once | INTRAVENOUS | Status: AC
  Administered 2021-03-09: 200 mg via INTRAVENOUS
  Filled 2021-03-09: qty 40

## 2021-03-09 MED ORDER — ACETAMINOPHEN 500 MG PO TABS
1000.0000 mg | ORAL_TABLET | Freq: Once | ORAL | Status: AC
  Administered 2021-03-09: 1000 mg via ORAL
  Filled 2021-03-09: qty 2

## 2021-03-09 MED ORDER — SODIUM CHLORIDE 0.9 % IV SOLN
1000.0000 mL | INTRAVENOUS | Status: DC
  Administered 2021-03-09 (×2): 1000 mL via INTRAVENOUS

## 2021-03-09 MED ORDER — IBUPROFEN 800 MG PO TABS
800.0000 mg | ORAL_TABLET | Freq: Once | ORAL | Status: AC
  Administered 2021-03-09: 800 mg via ORAL
  Filled 2021-03-09: qty 1

## 2021-03-09 MED ORDER — DOXYCYCLINE HYCLATE 100 MG PO TABS
100.0000 mg | ORAL_TABLET | Freq: Once | ORAL | Status: AC
  Administered 2021-03-09: 100 mg via ORAL
  Filled 2021-03-09: qty 1

## 2021-03-09 NOTE — ED Provider Notes (Signed)
[name] EM[facility]PT Provider Note   CSN: [id number] Arrival date & time: 03/09/21  1557     History  Chief Complaint  Patient presents with   URI    [name] [name] is a 81 y.o. female.  Pt is an 81 yo wf with a hx of htn, copd, CAD, OSA, GERD, and depression.  She has had a cough since yesterday evening.  Pt said she has been unable to sleep.  Pt has not taken anything for fever.  Pt's RA O2 sat was 92%.  Pt put on 2L for comfort.  Pt is more sob with exertion and has basically been in bed for 2 days.      Home Medications Prior to Admission medications   Medication Sig Start Date End Date Taking? Authorizing Provider  albuterol (VENTOLIN HFA) 108 (90 Base) MCG/ACT inhaler Inhale 1-2 puffs into the lungs every 4 (four) hours as needed for wheezing or shortness of breath. 01/27/21   Martyn Ehrich, NP  ALPRAZolam Duanne Moron) 0.25 MG tablet TAKE (1) TABLET BY MOUTH TWICE DAILY AT 11AM & 5PM FOR ANXIETY. 01/25/21   Orma Render, NP  AMBULATORY NON FORMULARY MEDICATION One four wheel rolling walker with seat and hand brakes. Brand/style covered by insurance. Medically necessary for ICD-10: R26.81, Z74.1 12/15/20   Early, Coralee Pesa, NP  amLODipine (NORVASC) 10 MG tablet Take 1 tablet (10 mg total) by mouth daily. 02/28/21   Orma Render, NP  ARTHRITIS PAIN RELIEVING 0.075 % topical cream Apply topically. 07/06/20   [provider]  aspirin EC 81 MG tablet Take 81 mg by mouth daily. Swallow whole.    [provider]  atorvastatin (LIPITOR) 10 MG tablet Take 10 mg by mouth daily. 11/21/19   [provider]  Bacillus Coagulans-Inulin (PROBIOTIC FORMULA) 1-250 BILLION-MG CAPS Take by mouth. 06/19/20   [provider]  camphor-menthol Timoteo Ace) lotion Apply 1 application topically as needed for itching. 12/19/19   Brunetta Jeans, PA-C  cetirizine (ZYRTEC) 10 MG tablet Take 10 mg by mouth daily.    [provider]  cloNIDine  (CATAPRES) 0.2 MG tablet Take 0.2 mg by mouth daily. Take 1 tablet by mouth once every hour as needed for bp greater than 190    [provider]  diphenhydrAMINE (BENADRYL) 25 mg capsule Take 1 capsule (25 mg total) by mouth every 8 (eight) hours as needed for itching or allergies. 02/04/21   Orma Render, NP  EPINEPHrine 0.3 mg/0.3 mL IJ SOAJ injection SMARTSIG:0.3 Milligram(s) IM Once PRN 11/03/19   [provider]  estradiol (ESTRACE) 0.1 MG/GM vaginal cream Place 0.5g nightly for two weeks then twice a week after 12/21/20   Jaquita Folds, MD  famotidine (PEPCID) 20 MG tablet Take 1 tablet (20 mg total) by mouth daily as needed for heartburn or indigestion. 08/13/20   Orma Render, NP  hydrALAZINE (APRESOLINE) 25 MG tablet Take 25 mg by mouth 2 (two) times daily. 12/12/19   [provider]  hydrochlorothiazide (HYDRODIURIL) 25 MG tablet 25mg  daily for BP greater than 010 systolic. 12.5mg  for BP less than 932 systolic. 02/04/21   Orma Render, NP  Lactase 9000 units TABS Take one tablet with the first bite of meals containing milk or dairy products. 11/11/20   Orma Render, NP  mirtazapine (REMERON) 7.5 MG tablet Take 1 tablet (7.5 mg total) by mouth at bedtime. For depression and anxiety symptoms 11/11/20   Early, Coralee Pesa, NP  olmesartan (BENICAR) 40 MG tablet Take 1 tablet (40 mg total) by mouth daily. 03/01/21   Donato Heinz, MD  pantoprazole (PROTONIX) 40 MG tablet TAKE ONE TABLET BY MOUTH EVERY MORNING FOR GERD 04/28/20   Orma Flaming, MD  PARoxetine (PAXIL) 40 MG tablet Take 40 mg by mouth daily. 10/08/19   [provider]  Probiotic Product (PROBIOTIC ACIDOPHILUS BEADS) CAPS Take 250 mg by mouth 2 (two) times daily.    [provider]  TRELEGY ELLIPTA 100-62.5-25 MCG/ACT AEPB INHALE 1 PUFF INTO THE LUNGS DAILY. ** RINSE MOUTH AFTER USE** 02/21/21   Parrett, Fonnie Mu, NP      Allergies    Azithromycin, Bee venom, Erythromycin base,  Metronidazole, Nitrofurantoin macrocrystal, Penicillins, Sulfa antibiotics, Cymbalta [duloxetine hcl], Ivp dye [iodinated contrast media], Penicillin g, Codeine, Erythromycin, Fosfomycin, Levofloxacin, Nitrofurantoin, and Percocet [oxycodone-acetaminophen]    Review of Systems   Review of Systems  Constitutional:  Positive for fever.  Respiratory:  Positive for cough and shortness of breath.   All other systems reviewed and are negative.  Physical Exam Updated Vital Signs BP 116/70    Pulse 95    Temp (!) 101 F (38.3 C) (Oral)    Resp 19    SpO2 97%  Physical Exam Vitals and nursing note reviewed.  Constitutional:      Appearance: Normal appearance.  HENT:     Head: Normocephalic and atraumatic.     Right Ear: External ear normal.     Left Ear: External ear normal.     Nose: Nose normal.     Mouth/Throat:     Mouth: Mucous membranes are dry.  Eyes:     Extraocular Movements: Extraocular movements intact.     Conjunctiva/sclera: Conjunctivae normal.     Pupils: Pupils are equal, round, and reactive to light.  Cardiovascular:     Rate and Rhythm: Regular rhythm. Tachycardia present.     Pulses: Normal pulses.     Heart sounds: Normal heart sounds.  Pulmonary:     Effort: Pulmonary effort is normal.     Breath sounds: Normal breath sounds.  Abdominal:     General: Abdomen is flat. Bowel sounds are normal.     Palpations: Abdomen is soft.  Musculoskeletal:        General: Normal range of motion.     Cervical back: Normal range of motion and neck supple.  Skin:    General: Skin is warm.     Capillary Refill: Capillary refill takes less than 2 seconds.  Neurological:     General: No focal deficit present.     Mental Status: She is alert and oriented to person, place, and time.  Psychiatric:        Mood and Affect: Mood normal.        Behavior: Behavior normal.    ED Results / Procedures / Treatments   Labs (all labs ordered are listed, but only abnormal results are  displayed) Labs Reviewed  RESP PANEL BY RT-PCR (FLU A&B, COVID) ARPGX2 - Abnormal; Notable for the following components:      Result Value   SARS Coronavirus 2 by RT PCR POSITIVE (*)    All other components within normal limits  BASIC METABOLIC PANEL - Abnormal; Notable for the following components:   Glucose, Bld 142 (*)    All other components within normal limits  CBC WITH DIFFERENTIAL/PLATELET - Abnormal; Notable for the following components:   Hemoglobin 8.8 (*)    HCT 30.8 (*)  MCV 68.4 (*)    MCH 19.6 (*)    MCHC 28.6 (*)    RDW 18.8 (*)    Monocytes Absolute 1.1 (*)    All other components within normal limits  RETICULOCYTES - Abnormal; Notable for the following components:   Immature Retic Fract 24.3 (*)    All other components within normal limits  CULTURE, BLOOD (ROUTINE X 2)  CULTURE, BLOOD (ROUTINE X 2)  BRAIN NATRIURETIC PEPTIDE  LACTIC ACID, PLASMA  HEPATIC FUNCTION PANEL  OCCULT BLOOD X 1 CARD TO LAB, STOOL  LACTIC ACID, PLASMA  VITAMIN B12  FOLATE  IRON AND TIBC  FERRITIN  PROCALCITONIN  LACTATE DEHYDROGENASE  TRIGLYCERIDES  C-REACTIVE PROTEIN  D-DIMER, QUANTITATIVE (NOT AT [facility])  FIBRINOGEN  TROPONIN I (HIGH SENSITIVITY)  TROPONIN I (HIGH SENSITIVITY)    EKG EKG Interpretation  Date/Time:  Wednesday March 09 2021 16:14:07 EST Ventricular Rate:  103 PR Interval:  154 QRS Duration: 95 QT Interval:  337 QTC Calculation: 442 R Axis:   -39 Text Interpretation: Sinus tachycardia with irregular rate Left axis deviation Abnormal R-wave progression, late transition No old tracing to compare Confirmed by Isla Pence [phone]) on 03/09/2021 4:15:05 PM  Radiology DG Chest Port 1 View  Result Date: 03/09/2021 CLINICAL DATA:  Shortness of breath and fever. Patient has not slept for 2 days by report. EXAM: PORTABLE CHEST 1 VIEW COMPARISON:  July 13, 2020 FINDINGS: EKG leads project over the patient's chest as well as a cardiac loop recorder in the soft  tissues of the anterior chest. Cardiomediastinal contours are stable. LEFT lower lobe airspace disease in the retrocardiac region. Lungs are otherwise clear. On limited assessment there is no acute skeletal process. IMPRESSION: LEFT lower lobe airspace disease in the retrocardiac region compatible suspicious for developing infection/pneumonia. Electronically Signed   By: Zetta Bills M.D.   On: 03/09/2021 16:29    Procedures Procedures    Medications Ordered in ED Medications  0.9 %  sodium chloride infusion (1,000 mLs Intravenous New Bag/Given 03/09/21 1819)  doxycycline (VIBRA-TABS) tablet 100 mg (has no administration in time range)  sodium chloride 0.9 % bolus 500 mL (0 mLs Intravenous Stopped 03/09/21 1808)  acetaminophen (TYLENOL) tablet 1,000 mg (1,000 mg Oral Given 03/09/21 1658)  cefTRIAXone (ROCEPHIN) 1 g in sodium chloride 0.9 % 100 mL IVPB (0 g Intravenous Stopped 03/09/21 1808)  ibuprofen (ADVIL) tablet 800 mg (800 mg Oral Given 03/09/21 1737)    ED Course/ Medical Decision Making/ A&P                           Medical Decision Making Amount and/or Complexity of Data Reviewed Labs: ordered. Radiology: ordered.  Risk OTC drugs. Prescription drug management. Decision regarding hospitalization.   Due to fever and sob, I ordered a CXR.  CXR reviewed and shows a LLL pneumonia.  Pt given a dose of Rocephin.  She has multiple allergies, but has taken cephalosporins in the past and has done well with them.  She is allergic to pcn, levaquin, and zithromax.  Covid test came back +.  Pt has had all her vaccinations including boosters.  Pt has multiple drug interactions with Paxlovid.    Pt has a new onset anemia.  Hgb 8.8 today.  Last hgb on 12/29/19 was 11.9.  Pt has not had any dark stool.  No obvious bleeding.  Stool is guaiac neg.  MCV is 68.4 which is low, so anemia is likely  iron deficiency.  Anemia panel ordered.  Pt's RA O2 sat with lying in bed is 91%.  The RT got her up just  to sit on the side of the bed and O2 sat dropped to 88% and she became very sob.  Pt d/w Dr. Roel Cluck (triad) for admission.  CRITICAL CARE Performed by: Isla Pence   Total critical care time: 30 minutes  Critical care time was exclusive of separately billable procedures and treating other patients.  Critical care was necessary to treat or prevent imminent or life-threatening deterioration.  Critical care was time spent personally by me on the following activities: development of treatment plan with patient and/or surrogate as well as nursing, discussions with consultants, evaluation of patient's response to treatment, examination of patient, obtaining history from patient or surrogate, ordering and performing treatments and interventions, ordering and review of laboratory studies, ordering and review of radiographic studies, pulse oximetry and re-evaluation of patient's condition.    Lenda Baratta was evaluated in Emergency Department on 03/09/2021 for the symptoms described in the history of present illness. She was evaluated in the context of the global COVID-19 pandemic, which necessitated consideration that the patient might be at risk for infection with the SARS-CoV-2 virus that causes COVID-19. Institutional protocols and algorithms that pertain to the evaluation of patients at risk for COVID-19 are in a state of rapid change based on information released by regulatory bodies including the CDC and federal and state organizations. These policies and algorithms were followed during the patient's care in the ED.          Final Clinical Impression(s) / ED Diagnoses Final diagnoses:  [id number]  Community acquired pneumonia of left lower lobe of lung  Iron deficiency anemia, unspecified iron deficiency anemia type  Acute respiratory failure with hypoxia [facility])    Rx / DC Orders ED Discharge Orders     None         Isla Pence, MD 03/09/21 1829

## 2021-03-09 NOTE — ED Notes (Signed)
Patient placed on RA to assess O2 sats. Patient does not wear O2 at home. Patient O2 dropped to 91-92% at rest; Attempted to place patient on the side of the bed. Patient became shob and O2 dropped to 88% on RA. Patient repositioned  in bed and O2 returned to 2 L - currently 97%. MD and RN made aware.

## 2021-03-09 NOTE — ED Notes (Signed)
Called [facility] to transport patient to [place] room 17

## 2021-03-10 ENCOUNTER — Inpatient Hospital Stay (HOSPITAL_COMMUNITY): Payer: Medicare HMO

## 2021-03-10 DIAGNOSIS — I2510 Atherosclerotic heart disease of native coronary artery without angina pectoris: Secondary | ICD-10-CM

## 2021-03-10 DIAGNOSIS — J449 Chronic obstructive pulmonary disease, unspecified: Secondary | ICD-10-CM

## 2021-03-10 DIAGNOSIS — I10 Essential (primary) hypertension: Secondary | ICD-10-CM

## 2021-03-10 DIAGNOSIS — J9601 Acute respiratory failure with hypoxia: Secondary | ICD-10-CM

## 2021-03-10 DIAGNOSIS — U071 COVID-19: Principal | ICD-10-CM

## 2021-03-10 DIAGNOSIS — G4733 Obstructive sleep apnea (adult) (pediatric): Secondary | ICD-10-CM

## 2021-03-10 HISTORY — DX: Acute respiratory failure with hypoxia: J96.01

## 2021-03-10 LAB — COMPREHENSIVE METABOLIC PANEL
ALT: 17 U/L (ref 0–44)
AST: 26 U/L (ref 15–41)
Albumin: 3 g/dL — ABNORMAL LOW (ref 3.5–5.0)
Alkaline Phosphatase: 87 U/L (ref 38–126)
Anion gap: 9 (ref 5–15)
BUN: 14 mg/dL (ref 8–23)
CO2: 25 mmol/L (ref 22–32)
Calcium: 8.4 mg/dL — ABNORMAL LOW (ref 8.9–10.3)
Chloride: 104 mmol/L (ref 98–111)
Creatinine, Ser: 1.42 mg/dL — ABNORMAL HIGH (ref 0.44–1.00)
GFR, Estimated: 37 mL/min — ABNORMAL LOW (ref >60)
Glucose, Bld: 157 mg/dL — ABNORMAL HIGH (ref 70–99)
Potassium: 3.5 mmol/L (ref 3.5–5.1)
Sodium: 138 mmol/L (ref 135–145)
Total Bilirubin: 0.1 mg/dL — ABNORMAL LOW (ref 0.3–1.2)
Total Protein: 6.1 g/dL — ABNORMAL LOW (ref 6.5–8.1)

## 2021-03-10 LAB — CBC
HCT: 30.2 % — ABNORMAL LOW (ref 36.0–46.0)
Hemoglobin: 8.4 g/dL — ABNORMAL LOW (ref 12.0–15.0)
MCH: 19.8 pg — ABNORMAL LOW (ref 26.0–34.0)
MCHC: 27.8 g/dL — ABNORMAL LOW (ref 30.0–36.0)
MCV: 71.1 fL — ABNORMAL LOW (ref 80.0–100.0)
Platelets: 299 10*3/uL (ref 150–400)
RBC: 4.25 MIL/uL (ref 3.87–5.11)
RDW: 19.1 % — ABNORMAL HIGH (ref 11.5–15.5)
WBC: 5.7 10*3/uL (ref 4.0–10.5)
nRBC: 0 % (ref 0.0–0.2)

## 2021-03-10 LAB — D-DIMER, QUANTITATIVE: D-Dimer, Quant: 1.67 ug/mL-FEU — ABNORMAL HIGH (ref 0.00–0.50)

## 2021-03-10 LAB — BASIC METABOLIC PANEL
Anion gap: 11 (ref 5–15)
BUN: 18 mg/dL (ref 8–23)
CO2: 25 mmol/L (ref 22–32)
Calcium: 8.3 mg/dL — ABNORMAL LOW (ref 8.9–10.3)
Chloride: 102 mmol/L (ref 98–111)
Creatinine, Ser: 1.49 mg/dL — ABNORMAL HIGH (ref 0.44–1.00)
GFR, Estimated: 35 mL/min — ABNORMAL LOW (ref >60)
Glucose, Bld: 122 mg/dL — ABNORMAL HIGH (ref 70–99)
Potassium: 3.5 mmol/L (ref 3.5–5.1)
Sodium: 138 mmol/L (ref 135–145)

## 2021-03-10 LAB — CBC WITH DIFFERENTIAL/PLATELET
Abs Immature Granulocytes: 0.03 10*3/uL (ref 0.00–0.07)
Basophils Absolute: 0 10*3/uL (ref 0.0–0.1)
Basophils Relative: 1 %
Eosinophils Absolute: 0.1 10*3/uL (ref 0.0–0.5)
Eosinophils Relative: 1 %
HCT: 28.7 % — ABNORMAL LOW (ref 36.0–46.0)
Hemoglobin: 8.1 g/dL — ABNORMAL LOW (ref 12.0–15.0)
Immature Granulocytes: 0 %
Lymphocytes Relative: 21 %
Lymphs Abs: 1.6 10*3/uL (ref 0.7–4.0)
MCH: 19.9 pg — ABNORMAL LOW (ref 26.0–34.0)
MCHC: 28.2 g/dL — ABNORMAL LOW (ref 30.0–36.0)
MCV: 70.3 fL — ABNORMAL LOW (ref 80.0–100.0)
Monocytes Absolute: 1.2 10*3/uL — ABNORMAL HIGH (ref 0.1–1.0)
Monocytes Relative: 16 %
Neutro Abs: 4.6 10*3/uL (ref 1.7–7.7)
Neutrophils Relative %: 61 %
Platelets: 289 10*3/uL (ref 150–400)
RBC: 4.08 MIL/uL (ref 3.87–5.11)
RDW: 18.8 % — ABNORMAL HIGH (ref 11.5–15.5)
WBC: 7.5 10*3/uL (ref 4.0–10.5)
nRBC: 0 % (ref 0.0–0.2)

## 2021-03-10 LAB — MAGNESIUM
Magnesium: 1.3 mg/dL — ABNORMAL LOW (ref 1.7–2.4)
Magnesium: 1.5 mg/dL — ABNORMAL LOW (ref 1.7–2.4)

## 2021-03-10 LAB — GLUCOSE, CAPILLARY
Glucose-Capillary: 128 mg/dL — ABNORMAL HIGH (ref 70–99)
Glucose-Capillary: 202 mg/dL — ABNORMAL HIGH (ref 70–99)
Glucose-Capillary: 322 mg/dL — ABNORMAL HIGH (ref 70–99)

## 2021-03-10 LAB — MRSA NEXT GEN BY PCR, NASAL: MRSA by PCR Next Gen: NOT DETECTED

## 2021-03-10 LAB — BRAIN NATRIURETIC PEPTIDE: B Natriuretic Peptide: 41.5 pg/mL (ref 0.0–100.0)

## 2021-03-10 LAB — C-REACTIVE PROTEIN: CRP: 4.6 mg/dL — ABNORMAL HIGH (ref <1.0)

## 2021-03-10 MED ORDER — PAROXETINE HCL 20 MG PO TABS
40.0000 mg | ORAL_TABLET | Freq: Every day | ORAL | Status: DC
  Administered 2021-03-10 – 2021-03-13 (×4): 40 mg via ORAL
  Filled 2021-03-10 (×5): qty 2

## 2021-03-10 MED ORDER — ATORVASTATIN CALCIUM 10 MG PO TABS
10.0000 mg | ORAL_TABLET | Freq: Every day | ORAL | Status: DC
  Administered 2021-03-10 – 2021-03-13 (×4): 10 mg via ORAL
  Filled 2021-03-10 (×4): qty 1

## 2021-03-10 MED ORDER — IPRATROPIUM-ALBUTEROL 0.5-2.5 (3) MG/3ML IN SOLN
3.0000 mL | Freq: Two times a day (BID) | RESPIRATORY_TRACT | Status: DC
  Administered 2021-03-11 – 2021-03-13 (×3): 3 mL via RESPIRATORY_TRACT
  Filled 2021-03-10 (×4): qty 3

## 2021-03-10 MED ORDER — ALBUTEROL SULFATE (2.5 MG/3ML) 0.083% IN NEBU: 3.0000 mL | INHALATION_SOLUTION | RESPIRATORY_TRACT | Status: DC | PRN

## 2021-03-10 MED ORDER — DOXYCYCLINE HYCLATE 100 MG PO TABS
100.0000 mg | ORAL_TABLET | Freq: Two times a day (BID) | ORAL | Status: DC
  Administered 2021-03-10 (×2): 100 mg via ORAL
  Filled 2021-03-10 (×2): qty 1

## 2021-03-10 MED ORDER — MIRTAZAPINE 15 MG PO TABS
7.5000 mg | ORAL_TABLET | Freq: Every day | ORAL | Status: DC
  Administered 2021-03-10 – 2021-03-12 (×3): 7.5 mg via ORAL
  Filled 2021-03-10 (×3): qty 1

## 2021-03-10 MED ORDER — HYDRALAZINE HCL 25 MG PO TABS
25.0000 mg | ORAL_TABLET | Freq: Two times a day (BID) | ORAL | Status: DC
  Administered 2021-03-10 – 2021-03-13 (×5): 25 mg via ORAL
  Filled 2021-03-10 (×6): qty 1

## 2021-03-10 MED ORDER — LACTATED RINGERS IV SOLN: INTRAVENOUS | Status: AC

## 2021-03-10 MED ORDER — ACETAMINOPHEN 650 MG RE SUPP: 650.0000 mg | Freq: Four times a day (QID) | RECTAL | Status: DC | PRN

## 2021-03-10 MED ORDER — UMECLIDINIUM BROMIDE 62.5 MCG/ACT IN AEPB
1.0000 | INHALATION_SPRAY | Freq: Every day | RESPIRATORY_TRACT | Status: DC
  Administered 2021-03-13: 1 via RESPIRATORY_TRACT
  Filled 2021-03-10: qty 7

## 2021-03-10 MED ORDER — ACETAMINOPHEN 325 MG PO TABS
650.0000 mg | ORAL_TABLET | Freq: Four times a day (QID) | ORAL | Status: DC | PRN
  Filled 2021-03-10: qty 2

## 2021-03-10 MED ORDER — GUAIFENESIN-DM 100-10 MG/5ML PO SYRP: 15.0000 mL | ORAL_SOLUTION | ORAL | Status: DC | PRN

## 2021-03-10 MED ORDER — ALPRAZOLAM 0.5 MG PO TABS
0.2500 mg | ORAL_TABLET | Freq: Two times a day (BID) | ORAL | Status: DC
  Administered 2021-03-10 – 2021-03-12 (×6): 0.25 mg via ORAL
  Filled 2021-03-10 (×6): qty 1

## 2021-03-10 MED ORDER — ENOXAPARIN SODIUM 60 MG/0.6ML IJ SOSY
55.0000 mg | PREFILLED_SYRINGE | INTRAMUSCULAR | Status: DC
  Administered 2021-03-11: 55 mg via SUBCUTANEOUS
  Filled 2021-03-10 (×2): qty 0.6

## 2021-03-10 MED ORDER — AMLODIPINE BESYLATE 10 MG PO TABS
10.0000 mg | ORAL_TABLET | Freq: Every day | ORAL | Status: DC
  Administered 2021-03-10: 10 mg via ORAL
  Filled 2021-03-10: qty 1

## 2021-03-10 MED ORDER — CLONIDINE HCL 0.2 MG PO TABS
0.2000 mg | ORAL_TABLET | Freq: Every day | ORAL | Status: DC
  Administered 2021-03-10: 0.2 mg via ORAL
  Filled 2021-03-10: qty 1

## 2021-03-10 MED ORDER — MAGNESIUM SULFATE 2 GM/50ML IV SOLN
2.0000 g | Freq: Once | INTRAVENOUS | Status: AC
  Administered 2021-03-10: 2 g via INTRAVENOUS
  Filled 2021-03-10: qty 50

## 2021-03-10 MED ORDER — ONDANSETRON HCL 4 MG PO TABS: 4.0000 mg | ORAL_TABLET | Freq: Four times a day (QID) | ORAL | Status: DC | PRN

## 2021-03-10 MED ORDER — MELATONIN 5 MG PO TABS: 10.0000 mg | ORAL_TABLET | Freq: Every evening | ORAL | Status: DC | PRN

## 2021-03-10 MED ORDER — ENOXAPARIN SODIUM 60 MG/0.6ML IJ SOSY
50.0000 mg | PREFILLED_SYRINGE | INTRAMUSCULAR | Status: DC
  Administered 2021-03-10: 50 mg via SUBCUTANEOUS
  Filled 2021-03-10: qty 0.6

## 2021-03-10 MED ORDER — IPRATROPIUM-ALBUTEROL 0.5-2.5 (3) MG/3ML IN SOLN
3.0000 mL | Freq: Four times a day (QID) | RESPIRATORY_TRACT | Status: DC
  Administered 2021-03-10 (×2): 3 mL via RESPIRATORY_TRACT
  Filled 2021-03-10 (×2): qty 3

## 2021-03-10 MED ORDER — METHYLPREDNISOLONE SODIUM SUCC 125 MG IJ SOLR
60.0000 mg | Freq: Every day | INTRAMUSCULAR | Status: DC
  Administered 2021-03-10: 60 mg via INTRAVENOUS
  Filled 2021-03-10: qty 2

## 2021-03-10 MED ORDER — FLUTICASONE FUROATE-VILANTEROL 100-25 MCG/ACT IN AEPB
1.0000 | INHALATION_SPRAY | Freq: Every day | RESPIRATORY_TRACT | Status: DC
  Administered 2021-03-11 – 2021-03-13 (×3): 1 via RESPIRATORY_TRACT
  Filled 2021-03-10: qty 28

## 2021-03-10 MED ORDER — FAMOTIDINE 20 MG PO TABS: 20.0000 mg | ORAL_TABLET | Freq: Every day | ORAL | Status: DC | PRN

## 2021-03-10 MED ORDER — HYDRALAZINE HCL 20 MG/ML IJ SOLN
10.0000 mg | Freq: Four times a day (QID) | INTRAMUSCULAR | Status: DC | PRN
  Administered 2021-03-12: 10 mg via INTRAVENOUS
  Filled 2021-03-10: qty 1

## 2021-03-10 MED ORDER — ASPIRIN EC 81 MG PO TBEC
81.0000 mg | DELAYED_RELEASE_TABLET | Freq: Every day | ORAL | Status: DC
  Administered 2021-03-10 – 2021-03-13 (×4): 81 mg via ORAL
  Filled 2021-03-10 (×4): qty 1

## 2021-03-10 MED ORDER — ONDANSETRON HCL 4 MG/2ML IJ SOLN: 4.0000 mg | Freq: Four times a day (QID) | INTRAMUSCULAR | Status: DC | PRN

## 2021-03-10 MED ORDER — MAGNESIUM SULFATE IN D5W 1-5 GM/100ML-% IV SOLN
1.0000 g | Freq: Once | INTRAVENOUS | Status: AC
  Administered 2021-03-10: 1 g via INTRAVENOUS
  Filled 2021-03-10: qty 100

## 2021-03-10 MED ORDER — PANTOPRAZOLE SODIUM 40 MG PO TBEC
40.0000 mg | DELAYED_RELEASE_TABLET | Freq: Every day | ORAL | Status: DC
  Administered 2021-03-10 – 2021-03-13 (×4): 40 mg via ORAL
  Filled 2021-03-10 (×4): qty 1

## 2021-03-10 NOTE — Evaluation (Signed)
Physical Therapy Evaluation Patient Details Name: [name] [name] MRN: [id number] DOB: 1940/03/13 Today's Date: 03/10/2021  History of Present Illness  81 y/o female presented to [place] ED on 03/09/21 for cough x 2 weeks, fever, and SOB on exertion. Tested positive for COVID-19. CT negative for pneumonia. Transferred to [facility]. PMH: COPD, coronary disease, HTN, morbid obesity.  Clinical Impression  Patient admitted with above diagnosis. Patient presents with generalized weakness, impaired balance, and decreased activity tolerance. Patient requiring 2L O2 [place] to maintain spO2 >90% with mobility. Patient at min guard level for mobility with use of RW. Patient lives at [facility] and uses rollator and requires assist for bathing at baseline. Patient will benefit from skilled PT services during acute stay to address listed deficits. Recommend HHPT at discharge back to ALF to maximize functional independence.      Recommendations for follow up therapy are one component of a multi-disciplinary discharge planning process, led by the attending physician.  Recommendations may be updated based on patient status, additional functional criteria and insurance authorization.  Follow Up Recommendations Home health PT    Assistance Recommended at Discharge Intermittent Supervision/Assistance  Patient can return home with the following  A little help with walking and/or transfers;A little help with bathing/dressing/bathroom;Assist for transportation    Equipment Recommendations None recommended by PT  Recommendations for Other Services       Functional Status Assessment Patient has had a recent decline in their functional status and demonstrates the ability to make significant improvements in function in a reasonable and predictable amount of time.     Precautions / Restrictions Precautions Precautions: Fall Precaution Comments: watch O2 Restrictions Weight Bearing Restrictions: No       Mobility  Bed Mobility Overal bed mobility: Needs Assistance Bed Mobility: Supine to Sit     Supine to sit: Min guard     General bed mobility comments: min guard for safety    Transfers Overall transfer level: Needs assistance Equipment used: Rolling [place] Shannahan (2 wheels) Transfers: Sit to/from Stand Sit to Stand: Min guard                Ambulation/Gait Ambulation/Gait assistance: Min guard Gait Distance (Feet): 20 Feet Assistive device: Rolling Magin Balbi (2 wheels) Gait Pattern/deviations: Step-through pattern, Decreased stride length Gait velocity: decreased     General Gait Details: min guard for safety, assist for managing lines  Stairs            Wheelchair Mobility    Modified Rankin (Stroke Patients Only)       Balance Overall balance assessment: Needs assistance Sitting-balance support: No upper extremity supported, Feet supported Sitting balance-Leahy Scale: Fair     Standing balance support: Bilateral upper extremity supported, Reliant on assistive device for balance Standing balance-Leahy Scale: Poor Standing balance comment: reilant on RW                             Pertinent Vitals/Pain Pain Assessment Pain Assessment: Faces Faces Pain Scale: Hurts little more Pain Location: L forearm IV site Pain Descriptors / Indicators: Discomfort, Grimacing Pain Intervention(s): Monitored during session, Repositioned    Home Living Family/patient expects to be discharged to:: Assisted living                 Home Equipment: Rollator (4 wheels)      Prior Function Prior Level of Function : Needs assist       Physical Assist : ADLs (  physical)   ADLs (physical): Bathing Mobility Comments: uses rollator for mobility ADLs Comments: requires assistance for bathing back and feet by staff at [employer]        Extremity/Trunk Assessment   Upper Extremity Assessment Upper Extremity Assessment: Defer to OT  evaluation    Lower Extremity Assessment Lower Extremity Assessment: Generalized weakness    Cervical / Trunk Assessment Cervical / Trunk Assessment: Normal  Communication   Communication: No difficulties  Cognition Arousal/Alertness: Awake/alert Behavior During Therapy: WFL for tasks assessed/performed Overall Cognitive Status: Within Functional Limits for tasks assessed                                 General Comments: tangential at times        General Comments General comments (skin integrity, edema, etc.): on 2L O2 [place], spO2 >95%. Ambulated to bathroom on RA with spO2 90%. Patient complaining of SOB with exertion 2/4 DOE. Donned 2L O2 [place] for comfort    Exercises     Assessment/Plan    PT Assessment Patient needs continued PT services  PT Problem List Decreased strength;Decreased activity tolerance;Decreased mobility;Decreased balance;Decreased coordination;Decreased safety awareness;Cardiopulmonary status limiting activity       PT Treatment Interventions DME instruction;Gait training;Functional mobility training;Therapeutic activities;Balance training;Therapeutic exercise;Patient/family education    PT Goals (Current goals can be found in the Care Plan section)  Acute Rehab PT Goals Patient Stated Goal: to go home PT Goal Formulation: With patient Time For Goal Achievement: 03/24/21 Potential to Achieve Goals: Good    Frequency Min 2X/week     Co-evaluation               AM-PAC PT "6 Clicks" Mobility  Outcome Measure Help needed turning from your back to your side while in a flat bed without using bedrails?: A Little Help needed moving from lying on your back to sitting on the side of a flat bed without using bedrails?: A Little Help needed moving to and from a bed to a chair (including a wheelchair)?: A Little Help needed standing up from a chair using your arms (e.g., wheelchair or bedside chair)?: A Little Help needed to walk in  hospital room?: A Little Help needed climbing 3-5 steps with a railing? : A Little 6 Click Score: 18    End of Session Equipment Utilized During Treatment: Oxygen Activity Tolerance: Patient tolerated treatment well Patient left: in chair;with call bell/phone within reach Nurse Communication: Mobility status PT Visit Diagnosis: Unsteadiness on feet (R26.81);Muscle weakness (generalized) (M62.81)    Time: 6195-0932 PT Time Calculation (min) (ACUTE ONLY): 30 min   Charges:   PT Evaluation $PT Eval Moderate Complexity: 1 Mod          Laquinda Moller A. Gilford Rile PT, DPT Acute Rehabilitation Services Pager [phone] Office [phone]   Linna Hoff 03/10/2021, 4:14 PM

## 2021-03-10 NOTE — Progress Notes (Signed)
Carelink Summary Report / Loop Recorder 

## 2021-03-10 NOTE — H&P (Signed)
History and Physical    [name] [name] KGM:[id number] DOB: Apr 01, 1940 DOA: 03/09/2021  DOS: the patient was seen and examined on 03/09/2021  PCP: Orma Render, NP   Patient coming from: ALF [facility]  I have personally briefly reviewed patient's old medical records in [facility]  CC: fever x 1 day, cough x 2 weeks HPI: 81 year old female with a history of COPD, coronary disease, hypertension, morbid obesity who lives at [facility].  Patient states that she has had 2 weeks of a cough.  She has 1 day of fever.  Cough is dry.  She has never had COVID before.  Patient presented to the ER for evaluation.  She states that she has had a fever of 103 yesterday.  On arrival to ER, temp noted to be 103.  Heart rate 104.  Blood pressure 141/103.  Ambulatory room air saturations dropped to 88% while walking.  Placed on 2 L of oxygen.  Labs showed patient positive for COVID.  Lactic acid was negative.  BNP was normal at 50.  White count 9.1, hemoglobin 8.8, platelets 336.  Chest x-ray showed possible left lower lobe airspace disease in the retrocardiac region.  Patient started on IV Rocephin and p.o. doxycycline due to a litany of medication allergies.  Patient transferred to [facility] for further care.   ED Course: febrile to 103 on arrival to ER. O2 sats dropped to 88% while walking. Covid +, CXR ??LLL pneumonia. Pt given IV rocephin and po doxycycline.  Review of Systems:  Review of Systems  Constitutional:  Positive for chills and fever. Negative for malaise/fatigue.  HENT: Negative.    Eyes: Negative.   Respiratory:  Positive for cough and shortness of breath.   Cardiovascular: Negative.   Gastrointestinal: Negative.   Genitourinary: Negative.   Musculoskeletal:  Positive for back pain.  Skin: Negative.   Neurological: Negative.   Endo/Heme/Allergies: Negative.   Psychiatric/Behavioral: Negative.    All other systems reviewed and are  negative.  Past Medical History:  Diagnosis Date   Allergy    Anxiety    Arthritis    Blood transfusion without reported diagnosis    Cancer ([place])    Melanoma on Great toe   Cataract    COPD (chronic obstructive pulmonary disease) (HCC)    Depression    GERD (gastroesophageal reflux disease)    Hyperlipidemia    Hypertension    Osteoporosis    Sleep apnea     Past Surgical History:  Procedure Laterality Date   ABDOMINAL HYSTERECTOMY  1980   CHOLECYSTECTOMY  1980   MELANOMA EXCISION Right    Great Toe   REPLACEMENT TOTAL KNEE Bilateral 2000   sacral neuromodulation       reports that she quit smoking about 35 years ago. Her smoking use included cigarettes. She started smoking about 66 years ago. She has never used smokeless tobacco. She reports that she does not currently use alcohol. She reports that she does not use drugs.  Allergies  Allergen Reactions   Azithromycin Anaphylaxis and Hives   Bee Venom Anaphylaxis   Erythromycin Base Anaphylaxis   Metronidazole Anaphylaxis   Nitrofurantoin Macrocrystal Anaphylaxis   Penicillins Anaphylaxis and Hives    childhood   Sulfa Antibiotics Anaphylaxis and Hives    Childhood   Cymbalta [Duloxetine Hcl]    Ivp Dye [Iodinated Contrast Media]    Penicillin G Hives   Codeine Rash   Erythromycin Rash and Hives   Fosfomycin Rash  Levofloxacin Rash   Nitrofurantoin Hives   Percocet [Oxycodone-Acetaminophen] Rash    Family History  Problem Relation Age of Onset   Cancer Mother    Intellectual disability Mother    Cancer Father    Early death Father    Hypertension Father    Cancer Sister    Arthritis Maternal Grandmother    Asthma Paternal Grandmother    Asthma Paternal Grandfather     Prior to Admission medications   Medication Sig Start Date End Date Taking? Authorizing Provider  albuterol (VENTOLIN HFA) 108 (90 Base) MCG/ACT inhaler Inhale 1-2 puffs into the lungs every 4 (four) hours as needed for wheezing or  shortness of breath. 01/27/21   Martyn Ehrich, NP  ALPRAZolam Duanne Moron) 0.25 MG tablet TAKE (1) TABLET BY MOUTH TWICE DAILY AT 11AM & 5PM FOR ANXIETY. 01/25/21   Orma Render, NP  AMBULATORY NON FORMULARY MEDICATION One four wheel rolling walker with seat and hand brakes. Brand/style covered by insurance. Medically necessary for ICD-10: R26.81, Z74.1 12/15/20   Early, Coralee Pesa, NP  amLODipine (NORVASC) 10 MG tablet Take 1 tablet (10 mg total) by mouth daily. 02/28/21   Orma Render, NP  ARTHRITIS PAIN RELIEVING 0.075 % topical cream Apply topically. 07/06/20   [provider]  aspirin EC 81 MG tablet Take 81 mg by mouth daily. Swallow whole.    [provider]  atorvastatin (LIPITOR) 10 MG tablet Take 10 mg by mouth daily. 11/21/19   [provider]  Bacillus Coagulans-Inulin (PROBIOTIC FORMULA) 1-250 BILLION-MG CAPS Take by mouth. 06/19/20   [provider]  camphor-menthol Timoteo Ace) lotion Apply 1 application topically as needed for itching. 12/19/19   Brunetta Jeans, PA-C  cetirizine (ZYRTEC) 10 MG tablet Take 10 mg by mouth daily.    [provider]  cloNIDine (CATAPRES) 0.2 MG tablet Take 0.2 mg by mouth daily. Take 1 tablet by mouth once every hour as needed for bp greater than 190    [provider]  diphenhydrAMINE (BENADRYL) 25 mg capsule Take 1 capsule (25 mg total) by mouth every 8 (eight) hours as needed for itching or allergies. 02/04/21   Orma Render, NP  EPINEPHrine 0.3 mg/0.3 mL IJ SOAJ injection SMARTSIG:0.3 Milligram(s) IM Once PRN 11/03/19   [provider]  estradiol (ESTRACE) 0.1 MG/GM vaginal cream Place 0.5g nightly for two weeks then twice a week after 12/21/20   Jaquita Folds, MD  famotidine (PEPCID) 20 MG tablet Take 1 tablet (20 mg total) by mouth daily as needed for heartburn or indigestion. 08/13/20   Orma Render, NP  hydrALAZINE (APRESOLINE) 25 MG tablet Take 25 mg by mouth 2 (two) times daily.  12/12/19   [provider]  hydrochlorothiazide (HYDRODIURIL) 25 MG tablet 25mg  daily for BP greater than 381 systolic. 12.5mg  for BP less than 017 systolic. 02/04/21   Orma Render, NP  Lactase 9000 units TABS Take one tablet with the first bite of meals containing milk or dairy products. 11/11/20   Orma Render, NP  mirtazapine (REMERON) 7.5 MG tablet Take 1 tablet (7.5 mg total) by mouth at bedtime. For depression and anxiety symptoms 11/11/20   Early, Coralee Pesa, NP  olmesartan (BENICAR) 40 MG tablet Take 1 tablet (40 mg total) by mouth daily. 03/01/21   Donato Heinz, MD  pantoprazole (PROTONIX) 40 MG tablet TAKE ONE TABLET BY MOUTH EVERY MORNING FOR GERD 04/28/20   Orma Flaming, MD  PARoxetine (PAXIL) 40  MG tablet Take 40 mg by mouth daily. 10/08/19   [provider]  Probiotic Product (PROBIOTIC ACIDOPHILUS BEADS) CAPS Take 250 mg by mouth 2 (two) times daily.    [provider]  TRELEGY ELLIPTA 100-62.5-25 MCG/ACT AEPB INHALE 1 PUFF INTO THE LUNGS DAILY. ** RINSE MOUTH AFTER USE** 02/21/21   Parrett, Fonnie Mu, NP    Physical Exam: Vitals:   03/09/21 1731 03/09/21 1938 03/09/21 2012 03/09/21 2125  BP:  (!) 150/74 (!) 113/98 98/85  Pulse:  91 89 87  Resp:  20 (!) 22 17  Temp: (!) 101 F (38.3 C) 99.1 F (37.3 C)    TempSrc: Oral Oral    SpO2:  96% 96% 96%    Physical Exam Vitals and nursing note reviewed.  Constitutional:      General: She is not in acute distress.    Appearance: Normal appearance. She is obese. She is not ill-appearing, toxic-appearing or diaphoretic.  HENT:     Head: Normocephalic and atraumatic.     Nose: Nose normal. No rhinorrhea.  Cardiovascular:     Rate and Rhythm: Normal rate and regular rhythm.     Pulses: Normal pulses.  Pulmonary:     Effort: No respiratory distress.     Breath sounds: No wheezing or rales.  Abdominal:     General: Abdomen is protuberant. There is no distension.     Tenderness: There is no  abdominal tenderness. There is no guarding or rebound.  Skin:    General: Skin is warm and dry.     Capillary Refill: Capillary refill takes less than 2 seconds.  Neurological:     General: No focal deficit present.     Mental Status: She is alert and oriented to person, place, and time.     Labs on Admission: I have personally reviewed following labs and imaging studies  CBC: Recent Labs  Lab 03/09/21 1620  WBC 9.1  NEUTROABS 7.0  HGB 8.8*  HCT 30.8*  MCV 68.4*  PLT 967   Basic Metabolic Panel: Recent Labs  Lab 03/09/21 1620  NA 136  K 3.7  CL 100  CO2 27  GLUCOSE 142*  BUN 11  CREATININE 0.82  CALCIUM 9.3   GFR: Estimated Creatinine Clearance: 68.8 mL/min (by C-G formula based on SCr of 0.82 mg/dL). Liver Function Tests: Recent Labs  Lab 03/09/21 1620  AST 20  ALT 14  ALKPHOS 101  BILITOT 0.5  PROT 7.2  ALBUMIN 3.9   No results for input(s): LIPASE, AMYLASE in the last 168 hours. No results for input(s): AMMONIA in the last 168 hours. Coagulation Profile: No results for input(s): INR, PROTIME in the last 168 hours. Cardiac Enzymes: No results for input(s): CKTOTAL, CKMB, CKMBINDEX, TROPONINI in the last 168 hours. BNP (last 3 results) No results for input(s): PROBNP in the last 8760 hours. HbA1C: No results for input(s): HGBA1C in the last 72 hours. CBG: No results for input(s): GLUCAP in the last 168 hours. Lipid Profile: Recent Labs    03/09/21 1620  TRIG 83   Thyroid Function Tests: No results for input(s): TSH, T4TOTAL, FREET4, T3FREE, THYROIDAB in the last 72 hours. Anemia Panel: Recent Labs    03/09/21 1743  VITAMINB12 288  FOLATE 31.1  FERRITIN 12  TIBC 451*  IRON 29  RETICCTPCT 1.6   Urine analysis:    Component Value Date/Time   BILIRUBINUR Negative 01/11/2021 1454   PROTEINUR Positive (A) 01/11/2021 1454   UROBILINOGEN 0.2 01/11/2021 1454  NITRITE Positive 01/11/2021 1454   LEUKOCYTESUR Moderate (2+) (A) 01/11/2021  1454    Radiological Exams on Admission: I have personally reviewed images DG Chest Port 1 View  Result Date: 03/09/2021 CLINICAL DATA:  Shortness of breath and fever. Patient has not slept for 2 days by report. EXAM: PORTABLE CHEST 1 VIEW COMPARISON:  July 13, 2020 FINDINGS: EKG leads project over the patient's chest as well as a cardiac loop recorder in the soft tissues of the anterior chest. Cardiomediastinal contours are stable. LEFT lower lobe airspace disease in the retrocardiac region. Lungs are otherwise clear. On limited assessment there is no acute skeletal process. IMPRESSION: LEFT lower lobe airspace disease in the retrocardiac region compatible suspicious for developing infection/pneumonia. Electronically Signed   By: Zetta Bills M.D.   On: 03/09/2021 16:29    EKG: I have personally reviewed EKG: NSR   Assessment/Plan Principal Problem:   COVID-19 virus infection Active Problems:   Acute respiratory failure with hypoxia (HCC)   Coronary artery disease involving native heart without angina pectoris   Essential hypertension   Chronic obstructive lung disease (HCC)   Morbid obesity ([place])   Obstructive sleep apnea syndrome    Assessment and Plan: * COVID-19 virus infection- (present on admission) Admit to medical bed. On IV remdesivir. Unclear from CXR if pt has true LLL pneumonia or not. Will obtain non-contrast CT chest. Pt already received first 24 hours of abx with Rocephin and doxycycline.  Acute respiratory failure with hypoxia (HCC) Continue supplemental O2. Pt does not use home O2.  Obstructive sleep apnea syndrome- (present on admission) Chronic.  Morbid obesity ([place])- (present on admission) Chronic.  Chronic obstructive lung disease ([place])- (present on admission) Prn albuterol nebs. No wheezing.  Essential hypertension- (present on admission) Stable.  Coronary artery disease involving native heart without angina pectoris- (present on admission) Stable. On  ASA, statin    DVT prophylaxis: Lovenox Code Status: Full Code Family Communication: no family at bedside  Disposition Plan: return to ALF([facility])  Consults called: none  Admission status: Inpatient, Telemetry bed   Kristopher Oppenheim, DO [facility] 03/10/2021, 12:15 AM

## 2021-03-10 NOTE — Assessment & Plan Note (Signed)
Stable

## 2021-03-10 NOTE — Assessment & Plan Note (Signed)
Chronic. 

## 2021-03-10 NOTE — Progress Notes (Signed)
°  Transition of Care [facility]) Screening Note   Patient Details  Name: [name] [name] Date of Birth: December 17, 1940   [facility]) CM/SW Contact:    Cyndi Bender, RN Phone Number: 03/10/2021, 8:44 AM    Transition of Care Department [facility]) has reviewed patient and no TOC needs have been identified at this time. We will continue to monitor patient advancement through interdisciplinary progression rounds. If new patient transition needs arise, please place a TOC consult.

## 2021-03-10 NOTE — Subjective & Objective (Signed)
CC: fever x 1 day, cough x 2 weeks HPI: 81 year old female with a history of COPD, coronary disease, hypertension, morbid obesity who lives at [facility].  Patient states that she has had 2 weeks of a cough.  She has 1 day of fever.  Cough is dry.  She has never had COVID before.  Patient presented to the ER for evaluation.  She states that she has had a fever of 103 yesterday.  On arrival to ER, temp noted to be 103.  Heart rate 104.  Blood pressure 141/103.  Ambulatory room air saturations dropped to 88% while walking.  Placed on 2 L of oxygen.  Labs showed patient positive for COVID.  Lactic acid was negative.  BNP was normal at 50.  White count 9.1, hemoglobin 8.8, platelets 336.  Chest x-ray showed possible left lower lobe airspace disease in the retrocardiac region.  Patient started on IV Rocephin and p.o. doxycycline due to a litany of medication allergies.  Patient transferred to [facility] for further care.

## 2021-03-10 NOTE — Assessment & Plan Note (Signed)
Stable. On ASA, statin

## 2021-03-10 NOTE — Assessment & Plan Note (Signed)
Continue supplemental O2. Pt does not use home O2.

## 2021-03-10 NOTE — Assessment & Plan Note (Signed)
Admit to medical bed. On IV remdesivir. Unclear from CXR if pt has true LLL pneumonia or not. Will obtain non-contrast CT chest. Pt already received first 24 hours of abx with Rocephin and doxycycline.

## 2021-03-10 NOTE — Progress Notes (Signed)
On my read of CT chest without contrast. Pt has only atelectasis of lung tissue. No pneumonia seen.  Procal is negative and pt without leukocytosis. I think her fever is due to covid and not from pneumonia. Hold on further abx for now.  Kristopher Oppenheim, DO

## 2021-03-10 NOTE — Assessment & Plan Note (Signed)
Prn albuterol nebs. No wheezing.

## 2021-03-10 NOTE — Evaluation (Signed)
Occupational Therapy Evaluation Patient Details Name: [name] [name] MRN: [id number] DOB: 02-Aug-1940 Today's Date: 03/10/2021   History of Present Illness 81 y/o female presented to [facility] ED on 03/09/21 for cough x 2 weeks, fever, and SOB on exertion. Tested positive for COVID-19. CT negative for pneumonia. Transferred to [facility]. PMH: COPD, coronary disease, HTN, morbid obesity.   Clinical Impression   Pt presents with decline in function and safety with ADLs and ADL mobility with impaired strength, balance and endurance. PTA pt lived at [facility] and used a Marketing executive for mobility and was Ind with ADLs/selfcare (assist with bathing back and feet in shower), light meal prep. Pt currently requires min A with LB ADLs, min guard A with grooming standing and min guard A with mobility using RW. Pt would benefit from acute OT services to address impairments to maximize level of function and safety     Recommendations for follow up therapy are one component of a multi-disciplinary discharge planning process, led by the attending physician.  Recommendations may be updated based on patient status, additional functional criteria and insurance authorization.   Follow Up Recommendations  No OT follow up    Assistance Recommended at Discharge Intermittent Supervision/Assistance  Patient can return home with the following A little help with bathing/dressing/bathroom;A little help with walking and/or transfers    Functional Status Assessment  Patient has had a recent decline in their functional status and demonstrates the ability to make significant improvements in function in a reasonable and predictable amount of time.  Equipment Recommendations  None recommended by OT    Recommendations for Other Services       Precautions / Restrictions Precautions Precautions: Fall Precaution Comments: watch O2 Restrictions Weight Bearing Restrictions: No      Mobility Bed Mobility Overal bed  mobility: Needs Assistance Bed Mobility: Supine to Sit     Supine to sit: Min guard     General bed mobility comments: min guard for safety    Transfers Overall transfer level: Needs assistance Equipment used: Rolling walker (2 wheels) Transfers: Sit to/from Stand Sit to Stand: Min guard                  Balance Overall balance assessment: Needs assistance Sitting-balance support: No upper extremity supported, Feet supported Sitting balance-Leahy Scale: Fair     Standing balance support: Bilateral upper extremity supported, Reliant on assistive device for balance Standing balance-Leahy Scale: Poor                             ADL either performed or assessed with clinical judgement   ADL Overall ADL's : Needs assistance/impaired Eating/Feeding: Independent;Sitting   Grooming: Wash/dry hands;Wash/dry face;Min guard;Standing   Upper Body Bathing: Set up;Sitting   Lower Body Bathing: Minimal assistance;Sitting/lateral leans Lower Body Bathing Details (indicate cue type and reason): basline Upper Body Dressing : Set up;Sitting   Lower Body Dressing: Minimal assistance;Sit to/from stand   Toilet Transfer: Min guard;Ambulation;Rolling walker (2 wheels);Regular Toilet;Grab bars;Cueing for safety   Toileting- Clothing Manipulation and Hygiene: Min guard;Sit to/from stand       Functional mobility during ADLs: Min guard;Rolling walker (2 wheels)       Vision Baseline Vision/History: 0 No visual deficits Patient Visual Report: No change from baseline       Perception     Praxis      Pertinent Vitals/Pain Pain Assessment Pain Assessment: Faces Faces Pain Scale: Hurts little more  Pain Location: L forearm IV site Pain Descriptors / Indicators: Discomfort, Grimacing Pain Intervention(s): Monitored during session, Repositioned     Hand Dominance Right   Extremity/Trunk Assessment Upper Extremity Assessment Upper Extremity Assessment: Overall  WFL for tasks assessed   Lower Extremity Assessment Lower Extremity Assessment: LLE deficits/detail   Cervical / Trunk Assessment Cervical / Trunk Assessment: Normal   Communication Communication Communication: No difficulties   Cognition Arousal/Alertness: Awake/alert Behavior During Therapy: WFL for tasks assessed/performed Overall Cognitive Status: Within Functional Limits for tasks assessed                                 General Comments: tangential at times     General Comments  on 2L O2 [place], spO2 >95%. Ambulated to bathroom on RA with spO2 90%. Patient complaining of SOB with exertion 2/4 DOE. Donned 2L O2 [place] for comfort    Exercises     Shoulder Instructions      Home Living Family/patient expects to be discharged to:: Assisted living                             Home Equipment: Rollator (4 wheels)          Prior Functioning/Environment Prior Level of Function : Needs assist       Physical Assist : ADLs (physical)   ADLs (physical): Bathing Mobility Comments: uses rollator for mobility ADLs Comments: requires assistance for bathing back and feet by staff at ALF        OT Problem List: Decreased activity tolerance;Impaired balance (sitting and/or standing)      OT Treatment/Interventions: Self-care/ADL training;Patient/family education;Therapeutic activities;DME and/or AE instruction    OT Goals(Current goals can be found in the care plan section) Acute Rehab OT Goals Patient Stated Goal: go back to ALF OT Goal Formulation: With patient Time For Goal Achievement: 03/24/21 Potential to Achieve Goals: Good ADL Goals Pt Will Perform Grooming: with supervision;with set-up;with modified independence;standing Pt Will Perform Upper Body Bathing: with modified independence;Independently;sitting Pt Will Perform Upper Body Dressing: with modified independence;Independently;sitting Pt Will Perform Lower Body Dressing: with min guard  assist;with supervision Pt Will Transfer to Toilet: with supervision;with modified independence;ambulating Pt Will Perform Toileting - Clothing Manipulation and hygiene: with supervision;with modified independence;sit to/from stand  OT Frequency: Min 2X/week    Co-evaluation              AM-PAC OT "6 Clicks" Daily Activity     Outcome Measure Help from another person eating meals?: None Help from another person taking care of personal grooming?: A Little Help from another person toileting, which includes using toliet, bedpan, or urinal?: A Little Help from another person bathing (including washing, rinsing, drying)?: A Little Help from another person to put on and taking off regular upper body clothing?: None Help from another person to put on and taking off regular lower body clothing?: A Little 6 Click Score: 20   End of Session Equipment Utilized During Treatment: Rolling walker (2 wheels)  Activity Tolerance: Patient tolerated treatment well Patient left: in chair;with call bell/phone within reach  OT Visit Diagnosis: Other abnormalities of gait and mobility (R26.89);Muscle weakness (generalized) (M62.81)                Time: 4193-7902 OT Time Calculation (min): 32 min Charges:  OT General Charges $OT Visit: 1 Visit OT Evaluation $OT Eval Moderate  Complexity: 1 Mod    Britt Bottom 03/10/2021, 4:47 PM

## 2021-03-10 NOTE — Progress Notes (Signed)
PROGRESS NOTE                                                                                                                                                                                                             Patient Demographics:    [name] [name], is a 81 y.o. female, DOB - April 30, 1940, ZOX:[id number]  Outpatient Primary MD for the patient is Early, Coralee Pesa, NP    LOS - 1  Admit date - 03/09/2021    Chief Complaint  Patient presents with   URI       Brief Narrative (HPI from H&P)   - 81 year old female with a history of COPD, coronary disease, hypertension, morbid obesity who lives at [facility] assisted living.  Patient presented with 2-week history of dry cough fever of 103 and some wheezing and shortness of breath.  She was diagnosed with COVID-19 infection causing COPD exacerbation and she was admitted to the hospital.   Subjective:    [name] [name] today has, No headache, No chest pain, No abdominal pain - No Nausea, No new weakness tingling or numbness, mild SOB   Assessment  & Plan :    Acute COVID-19 infection in a patient who is fully vaccinated causing bronchitis and COPD exacerbation.  Her inflammatory markers are relatively stable, she has some wheezing, currently placed on steroids along with oral doxycycline.  Does not require any COVID-specific treatment at this time.  We will continue to monitor closely.  Advance activity and titrate down oxygen.  Encouraged to sit up in chair use I-S and flutter valve for pulmonary toiletry.  2.  Acute hypoxic respiratory failure improving advance activity and titrate off oxygen.  Plan as a #1 above.  3.  Obesity with OSA.  Nighttime CPAP  4.  CAD.  Continue aspirin and statin for secondary prevention.  5.  AKI due to dehydration.  Hydrate with IV fluids and hold diuretics.  6.  Hypotension.  Hydrate.  Hold blood pressure medications and monitor.   7.   Mildly elevated D-dimer likely due to mild inflammation from COVID-19, on Lovenox for DVT prophylaxis.  Check lower extremity ultrasound       Condition - Extremely Guarded  Family Communication  :  none present  Code Status :  Full  Consults  :  None  PUD Prophylaxis :  PPI   Procedures  :     Leg [place]  CT - 1. Bronchial wall thickening with atelectasis bilaterally, greater on the left than on the right, may be infectious or inflammatory. 2. Stable right upper lobe pulmonary nodule. Continued surveillance is recommended. 3. Cardiomegaly with multi-vessel coronary artery disease. 4. Aortic atherosclerosis. 5. Hepatic steatosis.      Disposition Plan  :    Status is: Inpatient -   DVT Prophylaxis  :  Lovenox  SCDs Start: 03/10/21 0042    Lab Results  Component Value Date   PLT 299 03/10/2021    Diet :  Diet Order             Diet Heart Room service appropriate? Yes; Fluid consistency: Thin  Diet effective now                    Inpatient Medications  Scheduled Meds:  ALPRAZolam  0.25 mg Oral BID   aspirin EC  81 mg Oral Daily   atorvastatin  10 mg Oral Daily   [START ON 03/11/2021] enoxaparin (LOVENOX) injection  55 mg Subcutaneous Q24H   fluticasone furoate-vilanterol  1 puff Inhalation Daily   And   umeclidinium bromide  1 puff Inhalation Daily   hydrALAZINE  25 mg Oral BID   ipratropium-albuterol  3 mL Nebulization Q6H   mirtazapine  7.5 mg Oral QHS   pantoprazole  40 mg Oral Daily   PARoxetine  40 mg Oral Daily   Continuous Infusions:  lactated ringers     magnesium sulfate bolus IVPB     remdesivir 100 mg in NS 100 mL     PRN Meds:.acetaminophen **OR** acetaminophen, albuterol, famotidine, guaiFENesin-dextromethorphan, hydrALAZINE, melatonin, ondansetron **OR** ondansetron (ZOFRAN) IV  Antibiotics  :    Anti-infectives (From admission, onward)    Start     Dose/Rate Route Frequency Ordered Stop   03/10/21 1000  remdesivir 100 mg in sodium  chloride 0.9 % 100 mL IVPB       See Hyperspace for full Linked Orders Report.   100 mg 200 mL/hr over 30 Minutes Intravenous Daily 03/09/21 1838 03/14/21 0959   03/09/21 1845  remdesivir 200 mg in sodium chloride 0.9% 250 mL IVPB       See Hyperspace for full Linked Orders Report.   200 mg 580 mL/hr over 30 Minutes Intravenous Once 03/09/21 1838 03/09/21 1938   03/09/21 1830  doxycycline (VIBRA-TABS) tablet 100 mg        100 mg Oral  Once 03/09/21 1826 03/09/21 1853   03/09/21 1645  cefTRIAXone (ROCEPHIN) 1 g in sodium chloride 0.9 % 100 mL IVPB        1 g 200 mL/hr over 30 Minutes Intravenous  Once 03/09/21 1635 03/09/21 1808        Time Spent in minutes  30   Lala Lund M.D on 03/10/2021 at 10:55 AM  To page go to [email or web address]   [facility] -  Office  [phone]  See all Orders from today for further details    Objective:   Vitals:   03/10/21 0100 03/10/21 0154 03/10/21 0400 03/10/21 0841  BP:   (!) 90/57 129/60  Pulse: 77 77 79 91  Resp: 18 18 19 18   Temp:   97.7 F (36.5 C) 98.1 F (36.7 C)  TempSrc:   Oral Oral  SpO2:   93% 96%    Wt Readings from Last 3 Encounters:  03/01/21 113.6 kg  12/21/20 113.4  kg  12/20/20 114.9 kg     Intake/Output Summary (Last 24 hours) at 03/10/2021 1055 Last data filed at 03/10/2021 0827 Gross per 24 hour  Intake 1136.88 ml  Output --  Net 1136.88 ml     Physical Exam  Awake Alert, No new F.N deficits, Normal affect [place].AT,PERRAL Supple Neck, No JVD,   Symmetrical Chest wall movement, Good air movement bilaterally, mild wheezing RRR,No Gallops,Rubs or new Murmurs,  +ve B.Sounds, Abd Soft, No tenderness,   No Cyanosis, Clubbing or edema       Data Review:    CBC Recent Labs  Lab 03/09/21 1620 03/10/21 0142 03/10/21 0644  WBC 9.1 7.5 5.7  HGB 8.8* 8.1* 8.4*  HCT 30.8* 28.7* 30.2*  PLT 336 289 299  MCV 68.4* 70.3* 71.1*  MCH 19.6* 19.9* 19.8*  MCHC 28.6* 28.2* 27.8*  RDW 18.8* 18.8* 19.1*   LYMPHSABS 0.9 1.6  --   MONOABS 1.1* 1.2*  --   EOSABS 0.1 0.1  --   BASOSABS 0.0 0.0  --     Electrolytes Recent Labs  Lab 03/09/21 1620 03/09/21 1814 03/09/21 1815 03/10/21 0142 03/10/21 0644  NA 136  --   --  138 138  K 3.7  --   --  3.5 3.5  CL 100  --   --  104 102  CO2 27  --   --  25 25  GLUCOSE 142*  --   --  157* 122*  BUN 11  --   --  14 18  CREATININE 0.82  --   --  1.42* 1.49*  CALCIUM 9.3  --   --  8.4* 8.3*  AST 20  --   --  26  --   ALT 14  --   --  17  --   ALKPHOS 101  --   --  87  --   BILITOT 0.5  --   --  0.1*  --   ALBUMIN 3.9  --   --  3.0*  --   MG  --   --   --  1.3* 1.5*  CRP  --  3.6*  --   --  4.6*  DDIMER 1.60*  --   --   --  1.67*  PROCALCITON  --  <0.10  --   --   --   LATICACIDVEN 1.3  --  1.1  --   --   BNP 50.3  --   --   --  41.5    Lab Results  Component Value Date   HGBA1C 7.0 (H) 12/29/2019     Radiology Reports CT CHEST WO CONTRAST  Result Date: 03/10/2021 CLINICAL DATA:  Pneumonia, complication suspected. EXAM: CT CHEST WITHOUT CONTRAST TECHNIQUE: Multidetector CT imaging of the chest was performed following the standard protocol without IV contrast. RADIATION DOSE REDUCTION: This exam was performed according to the departmental dose-optimization program which includes automated exposure control, adjustment of the mA and/or kV according to patient size and/or use of iterative reconstruction technique. COMPARISON:  02/14/2021, 03/09/2021. FINDINGS: Cardiovascular: The heart is enlarged and there is no pericardial effusion. Multi-vessel coronary artery calcifications are noted. There is atherosclerotic calcification of the aorta without evidence of aneurysm. The pulmonary trunk is normal in caliber. Mediastinum/Nodes: Shotty lymph nodes are present in the mediastinum. No axillary lymphadenopathy. Evaluation of the hilar regions is limited due to lack of IV contrast. Calcifications and a few subcentimeter nodules are present in the  thyroid gland. The  trachea and esophagus are within normal limits. Lungs/Pleura: Bronchial wall thickening is present bilaterally. Atelectasis is noted in the lungs bilaterally. No effusion or pneumothorax. A subpleural nodule is present in the right upper lobe measuring 5 mm, axial image 58, unchanged from the prior exams. The previously described 3 mm nodule in the right lower lobe is not visualized on exam. Upper Abdomen: Hepatic steatosis is noted. The gallbladder is surgically absent. The spleen is mildly enlarged at 13.3 cm. Musculoskeletal: Degenerative changes are present in the thoracic spine. There is confluent osteophyte formation along the anterior longitudinal ligament which may be associated with diffuse idiopathic skeletal hyperostosis. No acute osseous abnormality is seen. IMPRESSION: 1. Bronchial wall thickening with atelectasis bilaterally, greater on the left than on the right, may be infectious or inflammatory. 2. Stable right upper lobe pulmonary nodule. Continued surveillance is recommended. 3. Cardiomegaly with multi-vessel coronary artery disease. 4. Aortic atherosclerosis. 5. Hepatic steatosis. Electronically Signed   By: Brett Fairy M.D.   On: 03/10/2021 02:42   DG Chest Port 1 View  Result Date: 03/10/2021 CLINICAL DATA:  81 year old female positive COVID-19. EXAM: PORTABLE CHEST 1 VIEW COMPARISON:  Chest CT 0223 hours today. FINDINGS: Portable AP semi upright view at 0701 hours. Mildly asymmetric elevated left hemidiaphragm with left lung base opacity which most resembles atelectasis by CT. Stable lung volumes and ventilation since 03/09/2021. No pneumothorax or pulmonary edema. Stable mediastinal contour. Superficial chest cardiac loop recorder. Visualized tracheal air column is within normal limits. No acute osseous abnormality identified. Paucity of bowel gas in the upper abdomen. IMPRESSION: Stable lung base atelectasis greater on the left. Electronically Signed   By: Genevie Ann  M.D.   On: 03/10/2021 07:14   DG Chest Port 1 View  Result Date: 03/09/2021 CLINICAL DATA:  Shortness of breath and fever. Patient has not slept for 2 days by report. EXAM: PORTABLE CHEST 1 VIEW COMPARISON:  July 13, 2020 FINDINGS: EKG leads project over the patient's chest as well as a cardiac loop recorder in the soft tissues of the anterior chest. Cardiomediastinal contours are stable. LEFT lower lobe airspace disease in the retrocardiac region. Lungs are otherwise clear. On limited assessment there is no acute skeletal process. IMPRESSION: LEFT lower lobe airspace disease in the retrocardiac region compatible suspicious for developing infection/pneumonia. Electronically Signed   By: Zetta Bills M.D.   On: 03/09/2021 16:29

## 2021-03-11 ENCOUNTER — Inpatient Hospital Stay (HOSPITAL_COMMUNITY): Payer: Medicare HMO

## 2021-03-11 DIAGNOSIS — R7989 Other specified abnormal findings of blood chemistry: Secondary | ICD-10-CM

## 2021-03-11 DIAGNOSIS — U071 COVID-19: Secondary | ICD-10-CM | POA: Diagnosis not present

## 2021-03-11 LAB — CBC WITH DIFFERENTIAL/PLATELET
Abs Immature Granulocytes: 0.04 10*3/uL (ref 0.00–0.07)
Basophils Absolute: 0 10*3/uL (ref 0.0–0.1)
Basophils Relative: 0 %
Eosinophils Absolute: 0 10*3/uL (ref 0.0–0.5)
Eosinophils Relative: 0 %
HCT: 27.3 % — ABNORMAL LOW (ref 36.0–46.0)
Hemoglobin: 8 g/dL — ABNORMAL LOW (ref 12.0–15.0)
Immature Granulocytes: 1 %
Lymphocytes Relative: 11 %
Lymphs Abs: 0.6 10*3/uL — ABNORMAL LOW (ref 0.7–4.0)
MCH: 20.2 pg — ABNORMAL LOW (ref 26.0–34.0)
MCHC: 29.3 g/dL — ABNORMAL LOW (ref 30.0–36.0)
MCV: 68.8 fL — ABNORMAL LOW (ref 80.0–100.0)
Monocytes Absolute: 0.1 10*3/uL (ref 0.1–1.0)
Monocytes Relative: 2 %
Neutro Abs: 4.5 10*3/uL (ref 1.7–7.7)
Neutrophils Relative %: 86 %
Platelets: 242 10*3/uL (ref 150–400)
RBC: 3.97 MIL/uL (ref 3.87–5.11)
RDW: 18.6 % — ABNORMAL HIGH (ref 11.5–15.5)
WBC: 5.2 10*3/uL (ref 4.0–10.5)
nRBC: 0 % (ref 0.0–0.2)

## 2021-03-11 LAB — COMPREHENSIVE METABOLIC PANEL
ALT: 23 U/L (ref 0–44)
AST: 35 U/L (ref 15–41)
Albumin: 2.9 g/dL — ABNORMAL LOW (ref 3.5–5.0)
Alkaline Phosphatase: 92 U/L (ref 38–126)
Anion gap: 11 (ref 5–15)
BUN: 28 mg/dL — ABNORMAL HIGH (ref 8–23)
CO2: 21 mmol/L — ABNORMAL LOW (ref 22–32)
Calcium: 8.2 mg/dL — ABNORMAL LOW (ref 8.9–10.3)
Chloride: 100 mmol/L (ref 98–111)
Creatinine, Ser: 1.49 mg/dL — ABNORMAL HIGH (ref 0.44–1.00)
GFR, Estimated: 35 mL/min — ABNORMAL LOW (ref >60)
Glucose, Bld: 306 mg/dL — ABNORMAL HIGH (ref 70–99)
Potassium: 4.1 mmol/L (ref 3.5–5.1)
Sodium: 132 mmol/L — ABNORMAL LOW (ref 135–145)
Total Bilirubin: 0.5 mg/dL (ref 0.3–1.2)
Total Protein: 6 g/dL — ABNORMAL LOW (ref 6.5–8.1)

## 2021-03-11 LAB — MAGNESIUM: Magnesium: 2.1 mg/dL (ref 1.7–2.4)

## 2021-03-11 LAB — C-REACTIVE PROTEIN: CRP: 4.7 mg/dL — ABNORMAL HIGH (ref <1.0)

## 2021-03-11 LAB — BRAIN NATRIURETIC PEPTIDE: B Natriuretic Peptide: 46.4 pg/mL (ref 0.0–100.0)

## 2021-03-11 LAB — D-DIMER, QUANTITATIVE: D-Dimer, Quant: 2.69 ug/mL-FEU — ABNORMAL HIGH (ref 0.00–0.50)

## 2021-03-11 MED ORDER — DIPHENHYDRAMINE HCL 25 MG PO CAPS: 25.0000 mg | ORAL_CAPSULE | Freq: Once | ORAL | Status: DC

## 2021-03-11 MED ORDER — SODIUM CHLORIDE 0.9 % IV SOLN
25.0000 mg | Freq: Once | INTRAVENOUS | Status: AC
  Administered 2021-03-11: 25 mg via INTRAVENOUS
  Filled 2021-03-11: qty 0.5

## 2021-03-11 MED ORDER — METHYLPREDNISOLONE SODIUM SUCC 40 MG IJ SOLR
40.0000 mg | Freq: Every day | INTRAMUSCULAR | Status: DC
  Administered 2021-03-11: 40 mg via INTRAVENOUS
  Filled 2021-03-11: qty 1

## 2021-03-11 MED ORDER — APIXABAN 5 MG PO TABS
10.0000 mg | ORAL_TABLET | Freq: Two times a day (BID) | ORAL | Status: DC
  Administered 2021-03-11 – 2021-03-13 (×4): 10 mg via ORAL
  Filled 2021-03-11 (×4): qty 2

## 2021-03-11 MED ORDER — DIPHENHYDRAMINE HCL 25 MG PO CAPS
25.0000 mg | ORAL_CAPSULE | Freq: Three times a day (TID) | ORAL | Status: DC | PRN
  Administered 2021-03-12 – 2021-03-13 (×3): 25 mg via ORAL
  Filled 2021-03-11 (×3): qty 1

## 2021-03-11 MED ORDER — APIXABAN 5 MG PO TABS: 5.0000 mg | ORAL_TABLET | Freq: Two times a day (BID) | ORAL | Status: DC

## 2021-03-11 NOTE — TOC Initial Note (Signed)
Transition of Care [facility]) - Initial/Assessment Note    Patient Details  Name: [name] [name] MRN: [id number] Date of Birth: 12/03/1940  [facility]) CM/SW Contact:    Benard Halsted, LCSW Phone Number: 03/11/2021, 4:53 PM  Clinical Narrative:                 CSW spoke with Rip Harbour at [facility] and discussed patient's discharge plan. She stated patient can have home health therapy as long as orders are faxed to them and they will contact home health. If patient requires oxygen at discharge, hospital needs to obtain orders and set it up with [facility]. They are aware of patient's COVID positive status. TOC to fax DC Summary and FL2 to f. [phone] at discharge.   Expected Discharge Plan: Assisted Living Barriers to Discharge: Continued Medical Work up   Patient Goals and CMS Choice Patient states their goals for this hospitalization and ongoing recovery are:: Return to ALF CMS Medicare.gov Compare Post Acute Care list provided to:: Patient Choice offered to / list presented to : Patient  Expected Discharge Plan and Services Expected Discharge Plan: Assisted Living In-house Referral: Clinical Social Work   Post Acute Care Choice: [place] arrangements for the past 2 months: [place]                                      Prior Living Arrangements/Services Living arrangements for the past 2 months: [place] Lives with:: Facility Resident Patient language and need for interpreter reviewed:: Yes Do you feel safe going back to the place where you live?: Yes      Need for Family Participation in Patient Care: Yes (Comment) Care giver support system in place?: Yes (comment) Current home services: DME Criminal Activity/Legal Involvement Pertinent to Current Situation/Hospitalization: No - Comment as needed  Activities of Daily Living Home Assistive Devices/Equipment: Gilford Rile (specify type) ADL Screening (condition  at time of admission) Patient's cognitive ability adequate to safely complete daily activities?: Yes Is the patient deaf or have difficulty hearing?: No Does the patient have difficulty seeing, even when wearing glasses/contacts?: No Does the patient have difficulty concentrating, remembering, or making decisions?: No Patient able to express need for assistance with ADLs?: Yes Does the patient have difficulty dressing or bathing?: No Independently performs ADLs?: Yes (appropriate for developmental age) Does the patient have difficulty walking or climbing stairs?: Yes Weakness of Legs: Both Weakness of Arms/Hands: None  Permission Sought/Granted Permission sought to share information with : Facility Sport and exercise psychologist, Family Supports Permission granted to share information with : Yes, Verbal Permission Granted     Permission granted to share info w AGENCY: [facility]        Emotional Assessment Appearance:: Appears stated age Attitude/Demeanor/Rapport: Engaged Affect (typically observed): Accepting, Appropriate Orientation: : Oriented to Self, Oriented to Place, Oriented to  Time, Oriented to Situation Alcohol / Substance Use: Not Applicable Psych Involvement: No (comment)  Admission diagnosis:  Acute respiratory failure with hypoxia (HCC) [J96.01] Iron deficiency anemia, unspecified iron deficiency anemia type [D50.9] Community acquired pneumonia of left lower lobe of lung [J18.9] COVID-19 virus infection [U07.1] COVID-19 [U07.1] Patient Active Problem List   Diagnosis Date Noted   Acute respiratory failure with hypoxia ([place]) 03/10/2021   COVID-19 virus infection 03/09/2021   Hypertension 02/04/2021   At risk for allergic reaction to medication 02/04/2021   Irritation of  right eye 12/09/2020   Lactose intolerance 11/11/2020   Pulmonary nodules 10/21/2020   Encounter for Medicare annual wellness exam 07/01/2020   Neuropathy 07/01/2020   COVID-19 vaccine series  started 07/01/2020   Need for shingles vaccine 07/01/2020   Encounter to establish care 06/03/2020   Encounter for medication management 06/03/2020   Coronary artery disease involving native heart without angina pectoris 12/18/2019   History of malignant melanoma 12/18/2019   Basal cell carcinoma of skin 12/18/2019   History of surgery for cerebral aneurysm 12/18/2019   Mixed hyperlipidemia 05/22/2019   Prediabetes 05/22/2019   Essential hypertension 05/19/2019   Adjustment disorder with mixed anxiety and depressed mood 05/19/2019   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 05/19/2019   Bilateral carotid artery occlusion 05/19/2019   Anemia 05/19/2019   Bipolar disorder ([place]) 05/19/2019   Chronic obstructive lung disease ([place]) 05/19/2019   Chronic pain syndrome 05/19/2019   Gastroesophageal reflux disease without esophagitis 05/19/2019   Generalized anxiety disorder 05/19/2019   History of opioid abuse ([place]) 05/19/2019   Hyperglycemia 05/19/2019   Hypertensive heart disease without congestive heart failure 05/19/2019   Obstructive sleep apnea syndrome 05/19/2019   Polyp of colon 05/19/2019   Depression, major, single episode, moderate ([place]) 05/19/2019   Spinal stenosis in cervical region 05/19/2019   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   Transient ischemic attack 05/19/2019   Mixed urinary incontinence due to female genital prolapse 05/19/2019   Tricuspid valve disorders, 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   Carotid artery plaque, 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   Panic disorder with agoraphobia 02/22/2018   Female stress incontinence 02/14/2018   Diverticulosis of large intestine 02/14/2018   Localized, primary osteoarthritis of shoulder region 02/14/2018   Mitral valve regurgitation 02/14/2018   Grade I internal hemorrhoids 02/14/2018   Bilateral carpal tunnel syndrome 02/11/2018   Menopause present 02/11/2018   Morbid obesity ([place]) 02/11/2018    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

## 2021-03-11 NOTE — Progress Notes (Signed)
ANTICOAGULATION CONSULT NOTE  Pharmacy Consult for Apixaban Indication: DVT  Allergies  Allergen Reactions   Azithromycin Anaphylaxis and Hives   Bee Venom Anaphylaxis   Erythromycin Base Anaphylaxis   Metronidazole Anaphylaxis   Nitrofurantoin Macrocrystal Anaphylaxis   Penicillins Anaphylaxis and Hives    childhood   Sulfa Antibiotics Anaphylaxis and Hives    Childhood   Cymbalta [Duloxetine Hcl]    Ivp Dye [Iodinated Contrast Media]    Penicillin G Hives   Codeine Rash   Erythromycin Rash and Hives   Fosfomycin Rash   Levofloxacin Rash   Nitrofurantoin Hives   Percocet [Oxycodone-Acetaminophen] Rash   Vital Signs: BP: 122/59 (02/03 0955)  Labs: Recent Labs    03/09/21 1620 03/09/21 1815 03/10/21 0142 03/10/21 0644 03/11/21 0119  HGB 8.8*  --  8.1* 8.4* 8.0*  HCT 30.8*  --  28.7* 30.2* 27.3*  PLT 336  --  289 299 242  CREATININE 0.82  --  1.42* 1.49* 1.49*  TROPONINIHS 11 13  --   --   --     Estimated Creatinine Clearance: 37.8 mL/min (A) (by C-G formula based on SCr of 1.49 mg/dL (H)).   Assessment: 81 yo female who presented with 2-week history of dry cough and fevers was found to have COVID-19 infection causing COPD exacerbation. Pharmacy consulted to manage apixaban for new DVT.   Goal of Therapy:  Monitor platelets by anticoagulation protocol: Yes   Plan:  Stop prophylactic enoxaparin Start apixaban 10mg  BID x7 days followed by apixaban 5mg  BID Continue to monitor H&H and platelets    Thank you for allowing pharmacy to be a part of this patients care.  Ardyth Harps, PharmD Clinical Pharmacist

## 2021-03-11 NOTE — Progress Notes (Signed)
PROGRESS NOTE                                                                                                                                                                                                             Patient Demographics:    [name] [name], is a 81 y.o. female, DOB - Dec 28, 1940, TIR:[id number]  Outpatient Primary MD for the patient is Early, Coralee Pesa, NP    LOS - 2  Admit date - 03/09/2021    Chief Complaint  Patient presents with   URI       Brief Narrative (HPI from H&P)   - 81 year old female with a history of COPD, coronary disease, hypertension, morbid obesity who lives at [facility].  Patient presented with 2-week history of dry cough fever of 103 and some wheezing and shortness of breath.  She was diagnosed with COVID-19 infection causing COPD exacerbation and she was admitted to the hospital.   Subjective:   Patient in bed, appears comfortable, denies any headache, no fever, no chest pain or pressure, no shortness of breath , no abdominal pain. No new focal weakness, some itching all over since this am.   Assessment  & Plan :    Acute COVID-19 infection in a patient who is fully vaccinated causing bronchitis and COPD exacerbation.  Her inflammatory markers are relatively stable, she has some wheezing, currently placed on steroids along with oral doxycycline, allergic to Doxy and several other medications will discontinue Doxy after 1 dose causing itching.  Does not require any COVID-specific treatment at this time, stop remdesivir continue steroids.  We will continue to monitor closely.  Advance activity and titrate down oxygen.  Encouraged to sit up in chair use I-S and flutter valve for pulmonary toiletry.  2.  Acute hypoxic respiratory failure improving advance activity and titrate off oxygen.  Plan as a #1 above.  3.  Obesity with OSA.  Nighttime CPAP  4.  CAD.  Continue  aspirin and statin for secondary prevention.  5.  AKI due to dehydration.  Hydrate with IV fluids and hold diuretics.  6.  Hypotension.  Hydrate.  Hold blood pressure medications and monitor.   7.  Mildly elevated D-dimer likely due to mild inflammation from COVID-19, on Lovenox for DVT prophylaxis.  Check lower extremity ultrasound  8.  Allergic reaction  with itching either to doxycycline on remdesivir.  Stop both medications, improved after Benadryl.  Monitor.       Condition - Extremely Guarded  Family Communication  :  none present  Code Status :  Full  Consults  :  None  PUD Prophylaxis : PPI   Procedures  :     Leg [place]  CT - 1. Bronchial wall thickening with atelectasis bilaterally, greater on the left than on the right, may be infectious or inflammatory. 2. Stable right upper lobe pulmonary nodule. Continued surveillance is recommended. 3. Cardiomegaly with multi-vessel coronary artery disease. 4. Aortic atherosclerosis. 5. Hepatic steatosis.      Disposition Plan  :    Status is: Inpatient -   DVT Prophylaxis  :  Lovenox  SCDs Start: 03/10/21 0042    Lab Results  Component Value Date   PLT 242 03/11/2021    Diet :  Diet Order             Diet Heart Room service appropriate? Yes; Fluid consistency: Thin  Diet effective now                    Inpatient Medications  Scheduled Meds:  ALPRAZolam  0.25 mg Oral BID   aspirin EC  81 mg Oral Daily   atorvastatin  10 mg Oral Daily   enoxaparin (LOVENOX) injection  55 mg Subcutaneous Q24H   fluticasone furoate-vilanterol  1 puff Inhalation Daily   And   umeclidinium bromide  1 puff Inhalation Daily   hydrALAZINE  25 mg Oral BID   ipratropium-albuterol  3 mL Nebulization BID   methylPREDNISolone (SOLU-MEDROL) injection  40 mg Intravenous Daily   mirtazapine  7.5 mg Oral QHS   pantoprazole  40 mg Oral Daily   PARoxetine  40 mg Oral Daily   Continuous Infusions:  diphenhydrAMINE 25 mg (03/11/21  1106)   PRN Meds:.acetaminophen **OR** acetaminophen, albuterol, diphenhydrAMINE, famotidine, guaiFENesin-dextromethorphan, hydrALAZINE, melatonin, ondansetron **OR** ondansetron (ZOFRAN) IV  Antibiotics  :    Anti-infectives (From admission, onward)    Start     Dose/Rate Route Frequency Ordered Stop   03/10/21 1200  doxycycline (VIBRA-TABS) tablet 100 mg  Status:  Discontinued        100 mg Oral Every 12 hours 03/10/21 1100 03/11/21 0817   03/10/21 1000  remdesivir 100 mg in sodium chloride 0.9 % 100 mL IVPB  Status:  Discontinued       See Hyperspace for full Linked Orders Report.   100 mg 200 mL/hr over 30 Minutes Intravenous Daily 03/09/21 1838 03/11/21 0817   03/09/21 1845  remdesivir 200 mg in sodium chloride 0.9% 250 mL IVPB       See Hyperspace for full Linked Orders Report.   200 mg 580 mL/hr over 30 Minutes Intravenous Once 03/09/21 1838 03/09/21 1938   03/09/21 1830  doxycycline (VIBRA-TABS) tablet 100 mg        100 mg Oral  Once 03/09/21 1826 03/09/21 1853   03/09/21 1645  cefTRIAXone (ROCEPHIN) 1 g in sodium chloride 0.9 % 100 mL IVPB        1 g 200 mL/hr over 30 Minutes Intravenous  Once 03/09/21 1635 03/09/21 1808        Time Spent in minutes  30   Lala Lund M.D on 03/11/2021 at 11:35 AM  To page go to [email or web address]   [facility] -  Office  [phone]  See all Orders from today for further details  Objective:   Vitals:   03/10/21 1126 03/10/21 2030 03/11/21 0955 03/11/21 1105  BP: (!) 95/42 (!) 124/49 (!) 122/59   Pulse: 80 94    Resp: 20 19    Temp:  97.6 F (36.4 C)    TempSrc:  Oral    SpO2: 93% 97%  99%    Wt Readings from Last 3 Encounters:  03/01/21 113.6 kg  12/21/20 113.4 kg  12/20/20 114.9 kg     Intake/Output Summary (Last 24 hours) at 03/11/2021 1135 Last data filed at 03/11/2021 1002 Gross per 24 hour  Intake 0 ml  Output 1000 ml  Net -1000 ml     Physical Exam  Awake Alert, No new F.N deficits, Normal  affect [place].AT,PERRAL Supple Neck, No JVD,   Symmetrical Chest wall movement, Good air movement bilaterally, CTAB RRR,No Gallops, Rubs or new Murmurs,  +ve B.Sounds, Abd Soft, No tenderness,   No Cyanosis, Clubbing or edema      Data Review:    CBC Recent Labs  Lab 03/09/21 1620 03/10/21 0142 03/10/21 0644 03/11/21 0119  WBC 9.1 7.5 5.7 5.2  HGB 8.8* 8.1* 8.4* 8.0*  HCT 30.8* 28.7* 30.2* 27.3*  PLT 336 289 299 242  MCV 68.4* 70.3* 71.1* 68.8*  MCH 19.6* 19.9* 19.8* 20.2*  MCHC 28.6* 28.2* 27.8* 29.3*  RDW 18.8* 18.8* 19.1* 18.6*  LYMPHSABS 0.9 1.6  --  0.6*  MONOABS 1.1* 1.2*  --  0.1  EOSABS 0.1 0.1  --  0.0  BASOSABS 0.0 0.0  --  0.0    Electrolytes Recent Labs  Lab 03/09/21 1620 03/09/21 1814 03/09/21 1815 03/10/21 0142 03/10/21 0644 03/11/21 0119  NA 136  --   --  138 138 132*  K 3.7  --   --  3.5 3.5 4.1  CL 100  --   --  104 102 100  CO2 27  --   --  25 25 21*  GLUCOSE 142*  --   --  157* 122* 306*  BUN 11  --   --  14 18 28*  CREATININE 0.82  --   --  1.42* 1.49* 1.49*  CALCIUM 9.3  --   --  8.4* 8.3* 8.2*  AST 20  --   --  26  --  35  ALT 14  --   --  17  --  23  ALKPHOS 101  --   --  87  --  92  BILITOT 0.5  --   --  0.1*  --  0.5  ALBUMIN 3.9  --   --  3.0*  --  2.9*  MG  --   --   --  1.3* 1.5* 2.1  CRP  --  3.6*  --   --  4.6* 4.7*  DDIMER 1.60*  --   --   --  1.67* 2.69*  PROCALCITON  --  <0.10  --   --   --   --   LATICACIDVEN 1.3  --  1.1  --   --   --   BNP 50.3  --   --   --  41.5 46.4    Lab Results  Component Value Date   HGBA1C 7.0 (H) 12/29/2019     Radiology Reports CT CHEST WO CONTRAST  Result Date: 03/10/2021 CLINICAL DATA:  Pneumonia, complication suspected. EXAM: CT CHEST WITHOUT CONTRAST TECHNIQUE: Multidetector CT imaging of the chest was performed following the standard protocol without IV contrast. RADIATION DOSE  REDUCTION: This exam was performed according to the departmental dose-optimization program which includes  automated exposure control, adjustment of the mA and/or kV according to patient size and/or use of iterative reconstruction technique. COMPARISON:  02/14/2021, 03/09/2021. FINDINGS: Cardiovascular: The heart is enlarged and there is no pericardial effusion. Multi-vessel coronary artery calcifications are noted. There is atherosclerotic calcification of the aorta without evidence of aneurysm. The pulmonary trunk is normal in caliber. Mediastinum/Nodes: Shotty lymph nodes are present in the mediastinum. No axillary lymphadenopathy. Evaluation of the hilar regions is limited due to lack of IV contrast. Calcifications and a few subcentimeter nodules are present in the thyroid gland. The trachea and esophagus are within normal limits. Lungs/Pleura: Bronchial wall thickening is present bilaterally. Atelectasis is noted in the lungs bilaterally. No effusion or pneumothorax. A subpleural nodule is present in the right upper lobe measuring 5 mm, axial image 58, unchanged from the prior exams. The previously described 3 mm nodule in the right lower lobe is not visualized on exam. Upper Abdomen: Hepatic steatosis is noted. The gallbladder is surgically absent. The spleen is mildly enlarged at 13.3 cm. Musculoskeletal: Degenerative changes are present in the thoracic spine. There is confluent osteophyte formation along the anterior longitudinal ligament which may be associated with diffuse idiopathic skeletal hyperostosis. No acute osseous abnormality is seen. IMPRESSION: 1. Bronchial wall thickening with atelectasis bilaterally, greater on the left than on the right, may be infectious or inflammatory. 2. Stable right upper lobe pulmonary nodule. Continued surveillance is recommended. 3. Cardiomegaly with multi-vessel coronary artery disease. 4. Aortic atherosclerosis. 5. Hepatic steatosis. Electronically Signed   By: Brett Fairy M.D.   On: 03/10/2021 02:42   DG Chest Port 1 View  Result Date: 03/10/2021 CLINICAL DATA:   81 year old female positive COVID-19. EXAM: PORTABLE CHEST 1 VIEW COMPARISON:  Chest CT 0223 hours today. FINDINGS: Portable AP semi upright view at 0701 hours. Mildly asymmetric elevated left hemidiaphragm with left lung base opacity which most resembles atelectasis by CT. Stable lung volumes and ventilation since 03/09/2021. No pneumothorax or pulmonary edema. Stable mediastinal contour. Superficial chest cardiac loop recorder. Visualized tracheal air column is within normal limits. No acute osseous abnormality identified. Paucity of bowel gas in the upper abdomen. IMPRESSION: Stable lung base atelectasis greater on the left. Electronically Signed   By: Genevie Ann M.D.   On: 03/10/2021 07:14   DG Chest Port 1 View  Result Date: 03/09/2021 CLINICAL DATA:  Shortness of breath and fever. Patient has not slept for 2 days by report. EXAM: PORTABLE CHEST 1 VIEW COMPARISON:  July 13, 2020 FINDINGS: EKG leads project over the patient's chest as well as a cardiac loop recorder in the soft tissues of the anterior chest. Cardiomediastinal contours are stable. LEFT lower lobe airspace disease in the retrocardiac region. Lungs are otherwise clear. On limited assessment there is no acute skeletal process. IMPRESSION: LEFT lower lobe airspace disease in the retrocardiac region compatible suspicious for developing infection/pneumonia. Electronically Signed   By: Zetta Bills M.D.   On: 03/09/2021 16:29

## 2021-03-11 NOTE — Plan of Care (Signed)

## 2021-03-11 NOTE — Progress Notes (Addendum)
Bilateral lower extremity venous duplex completed. Refer to "CV Proc" under chart review to view preliminary results.  Preliminary results discussed with Gae Bon, RN.  03/11/2021 4:59 PM Kelby Aline., MHA, RVT, RDCS, RDMS

## 2021-03-11 NOTE — Progress Notes (Signed)
OT Cancellation Note  Patient Details Name: [name] [name] MRN: [id number] DOB: [birth year]   Cancelled Treatment:    Reason Eval/Treat Not Completed: Other (comment) MD in with pt this morning upon attempt. OT will follow up next available time  Britt Bottom 03/11/2021, 3:31 PM

## 2021-03-11 NOTE — Progress Notes (Signed)
Inpatient Diabetes Program Recommendations  AACE/ADA: New Consensus Statement on Inpatient Glycemic Control (2015)  Target Ranges:  Prepandial:   less than 140 mg/dL      Peak postprandial:   less than 180 mg/dL (1-2 hours)      Critically ill patients:  140 - 180 mg/dL   Lab Results  Component Value Date   GLUCAP 322 (H) 03/10/2021   HGBA1C 7.0 (H) 12/29/2019    Review of Glycemic Control  Latest Reference Range & Units 03/10/21 08:10 03/10/21 12:20 03/10/21 17:06  Glucose-Capillary 70 - 99 mg/dL 128 (H) 202 (H) 322 (H)    Latest Reference Range & Units 03/11/21 01:19  Glucose 70 - 99 mg/dL 306 (H)   Diabetes history: PreDM  Current orders for Inpatient glycemic control:  None  Solumedrol 40 mg Daily  Inpatient Diabetes Program Recommendations:    Pt started on steroids, Glucose trends in the 300 range Dr. Candiss Norse to evaluate during rounds Would benefit from correction scale/ tradjenta.  Thanks,  Tama Headings RN, MSN, BC-ADM Inpatient Diabetes Coordinator Team Pager [phone] (8a-5p)

## 2021-03-12 DIAGNOSIS — U071 COVID-19: Secondary | ICD-10-CM | POA: Diagnosis not present

## 2021-03-12 LAB — COMPREHENSIVE METABOLIC PANEL
ALT: 24 U/L (ref 0–44)
AST: 31 U/L (ref 15–41)
Albumin: 3.5 g/dL (ref 3.5–5.0)
Alkaline Phosphatase: 85 U/L (ref 38–126)
Anion gap: 13 (ref 5–15)
BUN: 37 mg/dL — ABNORMAL HIGH (ref 8–23)
CO2: 19 mmol/L — ABNORMAL LOW (ref 22–32)
Calcium: 8.8 mg/dL — ABNORMAL LOW (ref 8.9–10.3)
Chloride: 99 mmol/L (ref 98–111)
Creatinine, Ser: 1.53 mg/dL — ABNORMAL HIGH (ref 0.44–1.00)
GFR, Estimated: 34 mL/min — ABNORMAL LOW (ref >60)
Glucose, Bld: 438 mg/dL — ABNORMAL HIGH (ref 70–99)
Potassium: 3.9 mmol/L (ref 3.5–5.1)
Sodium: 131 mmol/L — ABNORMAL LOW (ref 135–145)
Total Bilirubin: 0.4 mg/dL (ref 0.3–1.2)
Total Protein: 6.8 g/dL (ref 6.5–8.1)

## 2021-03-12 LAB — CBC WITH DIFFERENTIAL/PLATELET
Abs Immature Granulocytes: 0.09 10*3/uL — ABNORMAL HIGH (ref 0.00–0.07)
Basophils Absolute: 0 10*3/uL (ref 0.0–0.1)
Basophils Relative: 0 %
Eosinophils Absolute: 0 10*3/uL (ref 0.0–0.5)
Eosinophils Relative: 0 %
HCT: 29.9 % — ABNORMAL LOW (ref 36.0–46.0)
Hemoglobin: 8.8 g/dL — ABNORMAL LOW (ref 12.0–15.0)
Immature Granulocytes: 1 %
Lymphocytes Relative: 6 %
Lymphs Abs: 0.8 10*3/uL (ref 0.7–4.0)
MCH: 20 pg — ABNORMAL LOW (ref 26.0–34.0)
MCHC: 29.4 g/dL — ABNORMAL LOW (ref 30.0–36.0)
MCV: 68.1 fL — ABNORMAL LOW (ref 80.0–100.0)
Monocytes Absolute: 0.4 10*3/uL (ref 0.1–1.0)
Monocytes Relative: 3 %
Neutro Abs: 11.7 10*3/uL — ABNORMAL HIGH (ref 1.7–7.7)
Neutrophils Relative %: 90 %
Platelets: 350 10*3/uL (ref 150–400)
RBC: 4.39 MIL/uL (ref 3.87–5.11)
RDW: 18.6 % — ABNORMAL HIGH (ref 11.5–15.5)
WBC: 13 10*3/uL — ABNORMAL HIGH (ref 4.0–10.5)
nRBC: 0 % (ref 0.0–0.2)

## 2021-03-12 LAB — D-DIMER, QUANTITATIVE: D-Dimer, Quant: 2.1 ug/mL-FEU — ABNORMAL HIGH (ref 0.00–0.50)

## 2021-03-12 LAB — HEMOGLOBIN A1C
Hgb A1c MFr Bld: 8 % — ABNORMAL HIGH (ref 4.8–5.6)
Mean Plasma Glucose: 182.9 mg/dL

## 2021-03-12 LAB — BRAIN NATRIURETIC PEPTIDE: B Natriuretic Peptide: 99.1 pg/mL (ref 0.0–100.0)

## 2021-03-12 LAB — GLUCOSE, CAPILLARY
Glucose-Capillary: 312 mg/dL — ABNORMAL HIGH (ref 70–99)
Glucose-Capillary: 325 mg/dL — ABNORMAL HIGH (ref 70–99)
Glucose-Capillary: 294 mg/dL — ABNORMAL HIGH (ref 70–99)
Glucose-Capillary: 411 mg/dL — ABNORMAL HIGH (ref 70–99)

## 2021-03-12 LAB — MAGNESIUM: Magnesium: 2.1 mg/dL (ref 1.7–2.4)

## 2021-03-12 LAB — C-REACTIVE PROTEIN: CRP: 2.8 mg/dL — ABNORMAL HIGH (ref <1.0)

## 2021-03-12 MED ORDER — METHYLPREDNISOLONE SODIUM SUCC 40 MG IJ SOLR
20.0000 mg | Freq: Every day | INTRAMUSCULAR | Status: DC
  Administered 2021-03-12: 20 mg via INTRAVENOUS
  Filled 2021-03-12: qty 1

## 2021-03-12 MED ORDER — CARVEDILOL 3.125 MG PO TABS
3.1250 mg | ORAL_TABLET | Freq: Two times a day (BID) | ORAL | Status: DC
  Administered 2021-03-12 – 2021-03-13 (×2): 3.125 mg via ORAL
  Filled 2021-03-12 (×2): qty 1

## 2021-03-12 MED ORDER — INSULIN ASPART 100 UNIT/ML IJ SOLN
8.0000 [IU] | Freq: Once | INTRAMUSCULAR | Status: AC
  Administered 2021-03-12: 8 [IU] via SUBCUTANEOUS

## 2021-03-12 MED ORDER — FUROSEMIDE 40 MG PO TABS
40.0000 mg | ORAL_TABLET | Freq: Once | ORAL | Status: AC
Start: 2021-03-12 — End: 2021-03-12
  Administered 2021-03-12: 40 mg via ORAL
  Filled 2021-03-12: qty 1

## 2021-03-12 MED ORDER — INSULIN ASPART 100 UNIT/ML IJ SOLN: 0.0000 [IU] | Freq: Every day | INTRAMUSCULAR | Status: DC

## 2021-03-12 MED ORDER — INSULIN ASPART 100 UNIT/ML IJ SOLN
0.0000 [IU] | Freq: Three times a day (TID) | INTRAMUSCULAR | Status: DC
  Administered 2021-03-12: 8 [IU] via SUBCUTANEOUS
  Administered 2021-03-12 (×2): 11 [IU] via SUBCUTANEOUS
  Administered 2021-03-13: 3 [IU] via SUBCUTANEOUS

## 2021-03-12 MED ORDER — INSULIN GLARGINE-YFGN 100 UNIT/ML ~~LOC~~ SOLN
10.0000 [IU] | Freq: Once | SUBCUTANEOUS | Status: AC
Start: 2021-03-12 — End: 2021-03-12
  Administered 2021-03-12: 10 [IU] via SUBCUTANEOUS
  Filled 2021-03-12: qty 0.1

## 2021-03-12 MED ORDER — HYDROXYZINE HCL 25 MG PO TABS
25.0000 mg | ORAL_TABLET | Freq: Once | ORAL | Status: AC
  Administered 2021-03-12: 25 mg via ORAL
  Filled 2021-03-12: qty 1

## 2021-03-12 NOTE — Progress Notes (Signed)
OT Cancellation Note  Patient Details Name: [name] [name] MRN: [id number] DOB: 12/18/1940   Cancelled Treatment:    Reason Eval/Treat Not Completed: Patient declined, no reason specified Pt frustrated her discharge date has been pushed to another day. Pt declined to attempt any activity with OT until she had breakfast. OT will follow-up at a later date.  Layla Maw 03/12/2021, 9:10 AM

## 2021-03-12 NOTE — Progress Notes (Signed)
PROGRESS NOTE                                                                                                                                                                                                             Patient Demographics:    [name] [name], is a 81 y.o. female, DOB - [birth year], NFA:[id number]  Outpatient Primary MD for the patient is Early, Coralee Pesa, NP    LOS - 3  Admit date - 03/09/2021    Chief Complaint  Patient presents with   URI       Brief Narrative (HPI from H&P)   - 81 year old female with a history of COPD, coronary disease, hypertension, morbid obesity who lives at [facility].  Patient presented with 2-week history of dry cough fever of 103 and some wheezing and shortness of breath.  She was diagnosed with COVID-19 infection causing COPD exacerbation and she was admitted to the hospital.   Subjective:   Patient in bed, appears comfortable, denies any headache, no fever, no chest pain or pressure, no shortness of breath , no abdominal pain. No new focal weakness.    Assessment  & Plan :    Acute COVID-19 infection in a patient who is fully vaccinated causing bronchitis and COPD exacerbation.  Her inflammatory markers are relatively stable, she has some wheezing, currently placed on steroids along with oral doxycycline, allergic to Doxy and several other medications will discontinue Doxy after 1 dose causing itching.  Does not require any COVID-specific treatment at this time, stop remdesivir continue steroids.  We will continue to monitor closely.  Advance activity and titrate down oxygen.  Encouraged to sit up in chair use I-S and flutter valve for pulmonary toiletry.  2.  Acute hypoxic respiratory failure improving advance activity and titrate off oxygen.  Plan as a #1 above.  3.  Obesity with OSA.  Nighttime CPAP  4.  CAD.  Continue aspirin and statin for secondary  prevention.  5.  AKI - initially hydrated now appears to have fluid overload, hold further IV fluids gentle Lasix and monitor.  6.  Hypotension.  Resolved after hydration with IV fluids.  Placed on low-dose Coreg   7.  Acute right lower extremity DVT.  Placed on Eliquis.  8.  Allergic reaction with itching either to doxycycline on remdesivir.  Stop both medications, improved after Benadryl.  Monitor.         Condition - Extremely Guarded  Family Communication  :  none present  Code Status :  Full  Consults  :  None  PUD Prophylaxis : PPI   Procedures  :     Leg [place]  CT - 1. Bronchial wall thickening with atelectasis bilaterally, greater on the left than on the right, may be infectious or inflammatory. 2. Stable right upper lobe pulmonary nodule. Continued surveillance is recommended. 3. Cardiomegaly with multi-vessel coronary artery disease. 4. Aortic atherosclerosis. 5. Hepatic steatosis.      Disposition Plan  :    Status is: Inpatient -   DVT Prophylaxis  :  Lovenox  Place TED hose Start: 03/12/21 1012 SCDs Start: 03/10/21 0042 apixaban (ELIQUIS) tablet 10 mg  apixaban (ELIQUIS) tablet 5 mg    Lab Results  Component Value Date   PLT 350 03/12/2021    Diet :  Diet Order             Diet Heart Room service appropriate? Yes; Fluid consistency: Thin  Diet effective now                    Inpatient Medications  Scheduled Meds:  ALPRAZolam  0.25 mg Oral BID   apixaban  10 mg Oral BID   Followed by   Derrill Memo ON 03/18/2021] apixaban  5 mg Oral BID   aspirin EC  81 mg Oral Daily   atorvastatin  10 mg Oral Daily   carvedilol  3.125 mg Oral BID WC   fluticasone furoate-vilanterol  1 puff Inhalation Daily   And   umeclidinium bromide  1 puff Inhalation Daily   furosemide  40 mg Oral Once   hydrALAZINE  25 mg Oral BID   insulin aspart  0-15 Units Subcutaneous TID WC   insulin aspart  0-5 Units Subcutaneous QHS   ipratropium-albuterol  3 mL  Nebulization BID   mirtazapine  7.5 mg Oral QHS   pantoprazole  40 mg Oral Daily   PARoxetine  40 mg Oral Daily   Continuous Infusions:   PRN Meds:.acetaminophen **OR** acetaminophen, albuterol, diphenhydrAMINE, famotidine, guaiFENesin-dextromethorphan, hydrALAZINE, melatonin, ondansetron **OR** ondansetron (ZOFRAN) IV  Antibiotics  :    Anti-infectives (From admission, onward)    Start     Dose/Rate Route Frequency Ordered Stop   03/10/21 1200  doxycycline (VIBRA-TABS) tablet 100 mg  Status:  Discontinued        100 mg Oral Every 12 hours 03/10/21 1100 03/11/21 0817   03/10/21 1000  remdesivir 100 mg in sodium chloride 0.9 % 100 mL IVPB  Status:  Discontinued       See Hyperspace for full Linked Orders Report.   100 mg 200 mL/hr over 30 Minutes Intravenous Daily 03/09/21 1838 03/11/21 0817   03/09/21 1845  remdesivir 200 mg in sodium chloride 0.9% 250 mL IVPB       See Hyperspace for full Linked Orders Report.   200 mg 580 mL/hr over 30 Minutes Intravenous Once 03/09/21 1838 03/09/21 1938   03/09/21 1830  doxycycline (VIBRA-TABS) tablet 100 mg        100 mg Oral  Once 03/09/21 1826 03/09/21 1853   03/09/21 1645  cefTRIAXone (ROCEPHIN) 1 g in sodium chloride 0.9 % 100 mL IVPB        1 g 200 mL/hr over 30 Minutes Intravenous  Once 03/09/21 1635 03/09/21 1808  Time Spent in minutes  30   Lala Lund M.D on 03/12/2021 at 10:15 AM  To page go to [email or web address]   [facility] -  Office  [phone]  See all Orders from today for further details    Objective:   Vitals:   03/12/21 0028 03/12/21 0541 03/12/21 0754 03/12/21 0806  BP: (!) 146/63 116/68 (!) 152/64   Pulse: 78 99 86   Resp: 16 20 19    Temp: 99 F (37.2 C) 98.9 F (37.2 C) 98.1 F (36.7 C)   TempSrc: Oral Oral Oral   SpO2: 92% 94% 95% 97%    Wt Readings from Last 3 Encounters:  03/01/21 113.6 kg  12/21/20 113.4 kg  12/20/20 114.9 kg     Intake/Output Summary (Last 24 hours) at  03/12/2021 1015 Last data filed at 03/11/2021 1437 Gross per 24 hour  Intake 50 ml  Output 700 ml  Net -650 ml     Physical Exam  Awake Alert, No new F.N deficits, Normal affect [place].AT,PERRAL Supple Neck, No JVD,   Symmetrical Chest wall movement, Good air movement bilaterally, CTAB RRR,No Gallops, Rubs or new Murmurs,  +ve B.Sounds, Abd Soft, No tenderness,   No Cyanosis, 1+ edema      Data Review:    CBC Recent Labs  Lab 03/09/21 1620 03/10/21 0142 03/10/21 0644 03/11/21 0119 03/12/21 0231  WBC 9.1 7.5 5.7 5.2 13.0*  HGB 8.8* 8.1* 8.4* 8.0* 8.8*  HCT 30.8* 28.7* 30.2* 27.3* 29.9*  PLT 336 289 299 242 350  MCV 68.4* 70.3* 71.1* 68.8* 68.1*  MCH 19.6* 19.9* 19.8* 20.2* 20.0*  MCHC 28.6* 28.2* 27.8* 29.3* 29.4*  RDW 18.8* 18.8* 19.1* 18.6* 18.6*  LYMPHSABS 0.9 1.6  --  0.6* 0.8  MONOABS 1.1* 1.2*  --  0.1 0.4  EOSABS 0.1 0.1  --  0.0 0.0  BASOSABS 0.0 0.0  --  0.0 0.0    Electrolytes Recent Labs  Lab 03/09/21 1620 03/09/21 1814 03/09/21 1815 03/10/21 0142 03/10/21 0644 03/11/21 0119 03/12/21 0231  NA 136  --   --  138 138 132* 131*  K 3.7  --   --  3.5 3.5 4.1 3.9  CL 100  --   --  104 102 100 99  CO2 27  --   --  25 25 21* 19*  GLUCOSE 142*  --   --  157* 122* 306* 438*  BUN 11  --   --  14 18 28* 37*  CREATININE 0.82  --   --  1.42* 1.49* 1.49* 1.53*  CALCIUM 9.3  --   --  8.4* 8.3* 8.2* 8.8*  AST 20  --   --  26  --  35 31  ALT 14  --   --  17  --  23 24  ALKPHOS 101  --   --  87  --  92 85  BILITOT 0.5  --   --  0.1*  --  0.5 0.4  ALBUMIN 3.9  --   --  3.0*  --  2.9* 3.5  MG  --   --   --  1.3* 1.5* 2.1 2.1  CRP  --  3.6*  --   --  4.6* 4.7* 2.8*  DDIMER 1.60*  --   --   --  1.67* 2.69* 2.10*  PROCALCITON  --  <0.10  --   --   --   --   --   LATICACIDVEN 1.3  --  1.1  --   --   --   --   BNP 50.3  --   --   --  41.5 46.4 99.1    Lab Results  Component Value Date   HGBA1C 7.0 (H) 12/29/2019     Radiology Reports CT CHEST WO  CONTRAST  Result Date: 03/10/2021 CLINICAL DATA:  Pneumonia, complication suspected. EXAM: CT CHEST WITHOUT CONTRAST TECHNIQUE: Multidetector CT imaging of the chest was performed following the standard protocol without IV contrast. RADIATION DOSE REDUCTION: This exam was performed according to the departmental dose-optimization program which includes automated exposure control, adjustment of the mA and/or kV according to patient size and/or use of iterative reconstruction technique. COMPARISON:  02/14/2021, 03/09/2021. FINDINGS: Cardiovascular: The heart is enlarged and there is no pericardial effusion. Multi-vessel coronary artery calcifications are noted. There is atherosclerotic calcification of the aorta without evidence of aneurysm. The pulmonary trunk is normal in caliber. Mediastinum/Nodes: Shotty lymph nodes are present in the mediastinum. No axillary lymphadenopathy. Evaluation of the hilar regions is limited due to lack of IV contrast. Calcifications and a few subcentimeter nodules are present in the thyroid gland. The trachea and esophagus are within normal limits. Lungs/Pleura: Bronchial wall thickening is present bilaterally. Atelectasis is noted in the lungs bilaterally. No effusion or pneumothorax. A subpleural nodule is present in the right upper lobe measuring 5 mm, axial image 58, unchanged from the prior exams. The previously described 3 mm nodule in the right lower lobe is not visualized on exam. Upper Abdomen: Hepatic steatosis is noted. The gallbladder is surgically absent. The spleen is mildly enlarged at 13.3 cm. Musculoskeletal: Degenerative changes are present in the thoracic spine. There is confluent osteophyte formation along the anterior longitudinal ligament which may be associated with diffuse idiopathic skeletal hyperostosis. No acute osseous abnormality is seen. IMPRESSION: 1. Bronchial wall thickening with atelectasis bilaterally, greater on the left than on the right, may be  infectious or inflammatory. 2. Stable right upper lobe pulmonary nodule. Continued surveillance is recommended. 3. Cardiomegaly with multi-vessel coronary artery disease. 4. Aortic atherosclerosis. 5. Hepatic steatosis. Electronically Signed   By: Brett Fairy M.D.   On: 03/10/2021 02:42   DG Chest Port 1 View  Result Date: 03/10/2021 CLINICAL DATA:  81 year old female positive COVID-19. EXAM: PORTABLE CHEST 1 VIEW COMPARISON:  Chest CT 0223 hours today. FINDINGS: Portable AP semi upright view at 0701 hours. Mildly asymmetric elevated left hemidiaphragm with left lung base opacity which most resembles atelectasis by CT. Stable lung volumes and ventilation since 03/09/2021. No pneumothorax or pulmonary edema. Stable mediastinal contour. Superficial chest cardiac loop recorder. Visualized tracheal air column is within normal limits. No acute osseous abnormality identified. Paucity of bowel gas in the upper abdomen. IMPRESSION: Stable lung base atelectasis greater on the left. Electronically Signed   By: Genevie Ann M.D.   On: 03/10/2021 07:14   DG Chest Port 1 View  Result Date: 03/09/2021 CLINICAL DATA:  Shortness of breath and fever. Patient has not slept for 2 days by report. EXAM: PORTABLE CHEST 1 VIEW COMPARISON:  July 13, 2020 FINDINGS: EKG leads project over the patient's chest as well as a cardiac loop recorder in the soft tissues of the anterior chest. Cardiomediastinal contours are stable. LEFT lower lobe airspace disease in the retrocardiac region. Lungs are otherwise clear. On limited assessment there is no acute skeletal process. IMPRESSION: LEFT lower lobe airspace disease in the retrocardiac region compatible suspicious for developing infection/pneumonia. Electronically Signed   By: Cay Schillings  Wile M.D.   On: 03/09/2021 16:29   VAS [place] LOWER EXTREMITY VENOUS (DVT)  Result Date: 03/11/2021  Lower Venous DVT Study Patient Name:  NAVDEEP FESSENDEN  Date of Exam:   03/11/2021 Medical Rec #: [id number]          Accession #:    [id number] Date of Birth: 1941-01-16        Patient Gender: F Patient Age:   89 years Exam Location:  [facility] Procedure:      VAS [place] LOWER EXTREMITY VENOUS (DVT) Referring Phys: Deno Etienne [facility] --------------------------------------------------------------------------------  Indications: Elevated d-dimer= 2.69.  Limitations: Body habitus, poor ultrasound/tissue interface and patient unable to tolerate all compression maneuvers. Comparison Study: No prior study Performing Technologist: Maudry Mayhew MHA, RDMS, RVT, RDCS  Examination Guidelines: A complete evaluation includes B-mode imaging, spectral Doppler, color Doppler, and power Doppler as needed of all accessible portions of each vessel. Bilateral testing is considered an integral part of a complete examination. Limited examinations for reoccurring indications may be performed as noted. The reflux portion of the exam is performed with the patient in reverse Trendelenburg.  +---------+---------------+---------+-----------+----------+--------------+  RIGHT     Compressibility Phasicity Spontaneity Properties Thrombus Aging  +---------+---------------+---------+-----------+----------+--------------+  CFV       Full            Yes       Yes                                    +---------+---------------+---------+-----------+----------+--------------+  SFJ       Full                                                             +---------+---------------+---------+-----------+----------+--------------+  FV Prox   Full                                                             +---------+---------------+---------+-----------+----------+--------------+  FV Mid    Full                                                             +---------+---------------+---------+-----------+----------+--------------+  FV Distal Full                                                              +---------+---------------+---------+-----------+----------+--------------+  PFV       Full                                                             +---------+---------------+---------+-----------+----------+--------------+  POP       Full            Yes       Yes                                    +---------+---------------+---------+-----------+----------+--------------+  PTV       Full                      Yes                                    +---------+---------------+---------+-----------+----------+--------------+  PERO      None                      No                     Acute           +---------+---------------+---------+-----------+----------+--------------+   +---------+---------------+---------+-----------+---------------+--------------+  LEFT      Compressibility Phasicity Spontaneity Properties      Thrombus Aging  +---------+---------------+---------+-----------+---------------+--------------+  CFV       Full            Yes       Yes                                         +---------+---------------+---------+-----------+---------------+--------------+  SFJ       Full                                                                  +---------+---------------+---------+-----------+---------------+--------------+  FV Prox   Full                                                                  +---------+---------------+---------+-----------+---------------+--------------+  FV Mid    Full                                                                  +---------+---------------+---------+-----------+---------------+--------------+  FV Distal                           Yes         patent by color  Doppler                         +---------+---------------+---------+-----------+---------------+--------------+  PFV       Full                                                                   +---------+---------------+---------+-----------+---------------+--------------+  POP       Full                                                                  +---------+---------------+---------+-----------+---------------+--------------+  PTV       Full                                                                  +---------+---------------+---------+-----------+---------------+--------------+  PERO      Full                                                                  +---------+---------------+---------+-----------+---------------+--------------+    Summary: RIGHT: - Findings consistent with acute deep vein thrombosis involving the right peroneal veins. - No cystic structure found in the popliteal fossa.  LEFT: - There is no evidence of deep vein thrombosis in the lower extremity. However, portions of this examination were limited- see technologist comments above.  - No cystic structure found in the popliteal fossa.  *See table(s) above for measurements and observations.    Preliminary

## 2021-03-12 NOTE — Plan of Care (Signed)

## 2021-03-13 DIAGNOSIS — U071 COVID-19: Secondary | ICD-10-CM | POA: Diagnosis not present

## 2021-03-13 LAB — COMPREHENSIVE METABOLIC PANEL
ALT: 19 U/L (ref 0–44)
AST: 20 U/L (ref 15–41)
Albumin: 3.2 g/dL — ABNORMAL LOW (ref 3.5–5.0)
Alkaline Phosphatase: 87 U/L (ref 38–126)
Anion gap: 8 (ref 5–15)
BUN: 36 mg/dL — ABNORMAL HIGH (ref 8–23)
CO2: 26 mmol/L (ref 22–32)
Calcium: 8.9 mg/dL (ref 8.9–10.3)
Chloride: 103 mmol/L (ref 98–111)
Creatinine, Ser: 1.14 mg/dL — ABNORMAL HIGH (ref 0.44–1.00)
GFR, Estimated: 49 mL/min — ABNORMAL LOW (ref >60)
Glucose, Bld: 271 mg/dL — ABNORMAL HIGH (ref 70–99)
Potassium: 3.9 mmol/L (ref 3.5–5.1)
Sodium: 137 mmol/L (ref 135–145)
Total Bilirubin: 0.4 mg/dL (ref 0.3–1.2)
Total Protein: 6.2 g/dL — ABNORMAL LOW (ref 6.5–8.1)

## 2021-03-13 LAB — CBC WITH DIFFERENTIAL/PLATELET
Abs Immature Granulocytes: 0.1 10*3/uL — ABNORMAL HIGH (ref 0.00–0.07)
Basophils Absolute: 0 10*3/uL (ref 0.0–0.1)
Basophils Relative: 0 %
Eosinophils Absolute: 0.1 10*3/uL (ref 0.0–0.5)
Eosinophils Relative: 1 %
HCT: 28.5 % — ABNORMAL LOW (ref 36.0–46.0)
Hemoglobin: 8.2 g/dL — ABNORMAL LOW (ref 12.0–15.0)
Immature Granulocytes: 1 %
Lymphocytes Relative: 9 %
Lymphs Abs: 1.1 10*3/uL (ref 0.7–4.0)
MCH: 19.9 pg — ABNORMAL LOW (ref 26.0–34.0)
MCHC: 28.8 g/dL — ABNORMAL LOW (ref 30.0–36.0)
MCV: 69.2 fL — ABNORMAL LOW (ref 80.0–100.0)
Monocytes Absolute: 0.6 10*3/uL (ref 0.1–1.0)
Monocytes Relative: 5 %
Neutro Abs: 10 10*3/uL — ABNORMAL HIGH (ref 1.7–7.7)
Neutrophils Relative %: 84 %
Platelets: 349 10*3/uL (ref 150–400)
RBC: 4.12 MIL/uL (ref 3.87–5.11)
RDW: 18.9 % — ABNORMAL HIGH (ref 11.5–15.5)
WBC: 11.8 10*3/uL — ABNORMAL HIGH (ref 4.0–10.5)
nRBC: 0 % (ref 0.0–0.2)

## 2021-03-13 LAB — BRAIN NATRIURETIC PEPTIDE: B Natriuretic Peptide: 80.3 pg/mL (ref 0.0–100.0)

## 2021-03-13 LAB — GLUCOSE, CAPILLARY
Glucose-Capillary: 154 mg/dL — ABNORMAL HIGH (ref 70–99)
Glucose-Capillary: 191 mg/dL — ABNORMAL HIGH (ref 70–99)

## 2021-03-13 LAB — D-DIMER, QUANTITATIVE: D-Dimer, Quant: 1.5 ug/mL-FEU — ABNORMAL HIGH (ref 0.00–0.50)

## 2021-03-13 LAB — C-REACTIVE PROTEIN: CRP: 1.1 mg/dL — ABNORMAL HIGH (ref <1.0)

## 2021-03-13 LAB — MAGNESIUM: Magnesium: 1.9 mg/dL (ref 1.7–2.4)

## 2021-03-13 MED ORDER — CARVEDILOL 6.25 MG PO TABS: 6.2500 mg | ORAL_TABLET | Freq: Two times a day (BID) | ORAL | Status: DC

## 2021-03-13 MED ORDER — CARVEDILOL 3.125 MG PO TABS: 3.1250 mg | ORAL_TABLET | Freq: Once | ORAL | Status: DC

## 2021-03-13 MED ORDER — APIXABAN 5 MG PO TABS: 10.0000 mg | ORAL_TABLET | Freq: Two times a day (BID) | ORAL | 0 refills | Status: DC

## 2021-03-13 MED ORDER — LIVING WELL WITH DIABETES BOOK
Freq: Once | Status: DC
  Filled 2021-03-13: qty 1

## 2021-03-13 MED ORDER — CARVEDILOL 6.25 MG PO TABS: 6.2500 mg | ORAL_TABLET | Freq: Two times a day (BID) | ORAL | 0 refills | Status: DC

## 2021-03-13 MED ORDER — METFORMIN HCL 500 MG PO TABS: 500.0000 mg | ORAL_TABLET | Freq: Two times a day (BID) | ORAL | 0 refills | Status: DC

## 2021-03-13 MED ORDER — APIXABAN 5 MG PO TABS: 5.0000 mg | ORAL_TABLET | Freq: Two times a day (BID) | ORAL | 0 refills | Status: DC

## 2021-03-13 MED ORDER — FUROSEMIDE 40 MG PO TABS
40.0000 mg | ORAL_TABLET | Freq: Once | ORAL | Status: AC
  Administered 2021-03-13: 40 mg via ORAL
  Filled 2021-03-13: qty 1

## 2021-03-13 MED ORDER — POTASSIUM CHLORIDE CRYS ER 20 MEQ PO TBCR
20.0000 meq | EXTENDED_RELEASE_TABLET | Freq: Once | ORAL | Status: AC
  Administered 2021-03-13: 20 meq via ORAL
  Filled 2021-03-13: qty 1

## 2021-03-13 NOTE — Discharge Summary (Signed)
[name] [name] ERD:[id number] DOB: 1940-03-28 DOA: 03/09/2021  PCP: Orma Render, NP  Admit date: 03/09/2021  Discharge date: 03/13/2021  Admitted From: Home   Disposition:  Home   Recommendations for Outpatient Follow-up:   Follow up with PCP in 1-2 weeks  PCP Please obtain BMP/CBC, 2 view CXR in 1week,  (see Discharge instructions)   PCP Please follow up on the following pending results: Check CBC, CMP, magnesium, TSH, A1c and a two-view chest x-ray in 7 to 10 days.  Continue diabetic monitoring and teaching.   Home Health: PT Equipment/Devices: None  Consultations: None  Discharge Condition: Stable    CODE STATUS: Full    Diet Recommendation: Heart Healthy Low Carb  Diet Order             Diet Heart Room service appropriate? Yes; Fluid consistency: Thin  Diet effective now                    Chief Complaint  Patient presents with   URI     Brief history of present illness from the day of admission and additional interim summary    81 year old female with a history of COPD, coronary disease, hypertension, morbid obesity who lives at [facility].  Patient presented with 2-week history of dry cough fever of 103 and some wheezing and shortness of breath.  She was diagnosed with COVID-19 infection causing COPD exacerbation and she was admitted to the hospital.                                                                 Hospital Course    Acute COVID-19 infection in a patient who is fully vaccinated causing bronchitis and COPD exacerbation.  Her inflammatory markers are relatively stable, she had mild wheezing and cough, minimal to no shortness of breath, she was treated with a short course of steroids, due to multiple allergies we stopped her remdesivir and doxycycline.  She got  some breathing treatments as well.  Currently completely symptom-free no wheezing no oxygen requirement, eager to be discharged back home, COVID-19 infection itself was pretty mild and just called minimal bronchial irritation.  She is now close to baseline will be discharged home with outpatient PCP follow-up .   2.  Acute hypoxic respiratory failure improving was on nasal cannula oxygen at the time of admission.  Promptly titrated down to room air within 24 hours, remains on room air since last night without any symptom.   3.  Obesity with OSA.  Nighttime CPAP   4.  CAD.  Continue aspirin and statin for secondary prevention.   5.  AKI - initially hydrated now appears to have fluid overload, hold further IV fluids gentle Lasix given on yesterday and today.  Renal function has improved.  Will discharge her but hold back her HCTZ and ARB.  PCP to monitor renal function post discharge.   6.  Hypotension.  Resolved after hydration with IV fluids.  Placed on Coreg, continue home dose hydralazine and monitor   7.  Acute right lower extremity DVT.  Placed on Eliquis.   8.  Allergic reaction with itching either to doxycycline on remdesivir.  Stop both medications, improved after Benadryl.  Monitor.  9.  New diagnosis of DM type II.  Diabetic education, Glucophage and follow with PCP.  Lab Results  Component Value Date   HGBA1C 8.0 (H) 03/12/2021    Discharge diagnosis     Principal Problem:   COVID-19 virus infection Active Problems:   Coronary artery disease involving native heart without angina pectoris   Essential hypertension   Chronic obstructive lung disease ([place])   Morbid obesity ([place])   Obstructive sleep apnea syndrome   Acute respiratory failure with hypoxia [facility])    Discharge instructions    Discharge Instructions     Discharge instructions   Complete by: As directed    Follow with Primary MD Early, Coralee Pesa, NP in 7 days   Get CBC, CMP, Magnesium, TSH, A1c, 2 view Chest  X ray -  checked next visit within 1 week by Primary MD    Activity: As tolerated with Full fall precautions use walker/cane & assistance as needed  Disposition Home    Diet: Heart Healthy Low Carb  Special Instructions: If you have smoked or chewed Tobacco  in the last 2 yrs please stop smoking, stop any regular Alcohol  and or any Recreational drug use.  On your next visit with your primary care physician please Get Medicines reviewed and adjusted.  Please request your Prim.MD to go over all Hospital Tests and Procedure/Radiological results at the follow up, please get all Hospital records sent to your Prim MD by signing hospital release before you go home.  If you experience worsening of your admission symptoms, develop shortness of breath, life threatening emergency, suicidal or homicidal thoughts you must seek medical attention immediately by calling 911 or calling your MD immediately  if symptoms less severe.  You Must read complete instructions/literature along with all the possible adverse reactions/side effects for all the Medicines you take and that have been prescribed to you. Take any new Medicines after you have completely understood and accpet all the possible adverse reactions/side effects.   Increase activity slowly   Complete by: As directed        Discharge Medications   Allergies as of 03/13/2021       Reactions   Azithromycin Anaphylaxis, Hives   Bee Venom Anaphylaxis   Erythromycin Base Anaphylaxis   Metronidazole Anaphylaxis   Nitrofurantoin Macrocrystal Anaphylaxis   Penicillins Anaphylaxis, Hives   childhood   Sulfa Antibiotics Anaphylaxis, Hives   Childhood   Cymbalta [duloxetine Hcl]    Ivp Dye [iodinated Contrast Media]    Penicillin G Hives   Codeine Rash   Erythromycin Rash, Hives   Fosfomycin Rash   Levofloxacin Rash   Nitrofurantoin Hives   Percocet [oxycodone-acetaminophen] Rash        Medication List     STOP taking these medications     amLODipine 10 MG tablet Commonly known as: NORVASC   aspirin EC 81 MG tablet   cephALEXin 250 MG capsule Commonly known as: KEFLEX   hydrochlorothiazide 25 MG tablet Commonly known as: HYDRODIURIL   olmesartan 40 MG tablet Commonly known  as: BENICAR       TAKE these medications    albuterol 108 (90 Base) MCG/ACT inhaler Commonly known as: VENTOLIN HFA Inhale 1-2 puffs into the lungs every 4 (four) hours as needed for wheezing or shortness of breath.   ALPRAZolam 0.25 MG tablet Commonly known as: XANAX TAKE (1) TABLET BY MOUTH TWICE DAILY AT 11AM & 5PM FOR ANXIETY. What changed: See the new instructions.   AMBULATORY NON FORMULARY MEDICATION One four wheel rolling walker with seat and hand brakes. Brand/style covered by insurance. Medically necessary for ICD-10: R26.81, Z74.1   apixaban 5 MG Tabs tablet Commonly known as: ELIQUIS Take 2 tablets (10 mg total) by mouth 2 (two) times daily for 4 days.   apixaban 5 MG Tabs tablet Commonly known as: ELIQUIS Take 1 tablet (5 mg total) by mouth 2 (two) times daily. Start taking on: March 18, 2021   Arthritis Pain Relieving 0.075 % topical cream Generic drug: capsicum Apply 1 application topically 3 (three) times daily as needed (neuropathic pain in feet).   atorvastatin 10 MG tablet Commonly known as: LIPITOR Take 10 mg by mouth daily.   carvedilol 6.25 MG tablet Commonly known as: COREG Take 1 tablet (6.25 mg total) by mouth 2 (two) times daily with a meal.   cetirizine 10 MG tablet Commonly known as: ZYRTEC Take 10 mg by mouth daily.   cloNIDine 0.2 MG tablet Commonly known as: CATAPRES Take 0.2 mg by mouth See admin instructions. Take 0.2mg  by mouth once every hour as needed for bp greater than 190- Max 0.6mg    diphenhydrAMINE 25 mg capsule Commonly known as: BENADRYL Take 1 capsule (25 mg total) by mouth every 8 (eight) hours as needed for itching or allergies.   estradiol 0.1 MG/GM vaginal  cream Commonly known as: ESTRACE Place 0.5g nightly for two weeks then twice a week after What changed:  how much to take how to take this when to take this additional instructions   famotidine 20 MG tablet Commonly known as: PEPCID Take 1 tablet (20 mg total) by mouth daily as needed for heartburn or indigestion.   hydrALAZINE 25 MG tablet Commonly known as: APRESOLINE Take 25 mg by mouth 2 (two) times daily.   Lactase 9000 units Tabs Take one tablet with the first bite of meals containing milk or dairy products. What changed:  how much to take how to take this when to take this additional instructions   metFORMIN 500 MG tablet Commonly known as: Glucophage Take 1 tablet (500 mg total) by mouth 2 (two) times daily with a meal.   mirtazapine 7.5 MG tablet Commonly known as: REMERON Take 1 tablet (7.5 mg total) by mouth at bedtime. For depression and anxiety symptoms   pantoprazole 40 MG tablet Commonly known as: PROTONIX TAKE ONE TABLET BY MOUTH EVERY MORNING FOR GERD What changed: See the new instructions.   PARoxetine 40 MG tablet Commonly known as: PAXIL Take 40 mg by mouth daily.   Probiotic Acidophilus Beads Caps Take 250 mg by mouth 2 (two) times daily.   Sarna lotion Generic drug: camphor-menthol Apply 1 application topically as needed for itching. What changed: when to take this   Trelegy Ellipta 100-62.5-25 MCG/ACT Aepb Generic drug: Fluticasone-Umeclidin-Vilant INHALE 1 PUFF INTO THE LUNGS DAILY. ** RINSE MOUTH AFTER USE** What changed: See the new instructions.         Follow-up Information     Early, Coralee Pesa, NP. Schedule an appointment as soon as possible for  a visit in 1 week(s).   Specialty: Nurse Practitioner Contact information: [address] [place] [address] [phone]                 Major procedures and Radiology Reports - PLEASE review detailed and final reports thoroughly  -       CT CHEST WO  CONTRAST  Result Date: 03/10/2021 CLINICAL DATA:  Pneumonia, complication suspected. EXAM: CT CHEST WITHOUT CONTRAST TECHNIQUE: Multidetector CT imaging of the chest was performed following the standard protocol without IV contrast. RADIATION DOSE REDUCTION: This exam was performed according to the departmental dose-optimization program which includes automated exposure control, adjustment of the mA and/or kV according to patient size and/or use of iterative reconstruction technique. COMPARISON:  02/14/2021, 03/09/2021. FINDINGS: Cardiovascular: The heart is enlarged and there is no pericardial effusion. Multi-vessel coronary artery calcifications are noted. There is atherosclerotic calcification of the aorta without evidence of aneurysm. The pulmonary trunk is normal in caliber. Mediastinum/Nodes: Shotty lymph nodes are present in the mediastinum. No axillary lymphadenopathy. Evaluation of the hilar regions is limited due to lack of IV contrast. Calcifications and a few subcentimeter nodules are present in the thyroid gland. The trachea and esophagus are within normal limits. Lungs/Pleura: Bronchial wall thickening is present bilaterally. Atelectasis is noted in the lungs bilaterally. No effusion or pneumothorax. A subpleural nodule is present in the right upper lobe measuring 5 mm, axial image 58, unchanged from the prior exams. The previously described 3 mm nodule in the right lower lobe is not visualized on exam. Upper Abdomen: Hepatic steatosis is noted. The gallbladder is surgically absent. The spleen is mildly enlarged at 13.3 cm. Musculoskeletal: Degenerative changes are present in the thoracic spine. There is confluent osteophyte formation along the anterior longitudinal ligament which may be associated with diffuse idiopathic skeletal hyperostosis. No acute osseous abnormality is seen. IMPRESSION: 1. Bronchial wall thickening with atelectasis bilaterally, greater on the left than on the right, may be  infectious or inflammatory. 2. Stable right upper lobe pulmonary nodule. Continued surveillance is recommended. 3. Cardiomegaly with multi-vessel coronary artery disease. 4. Aortic atherosclerosis. 5. Hepatic steatosis. Electronically Signed   By: Brett Fairy M.D.   On: 03/10/2021 02:42   DG Chest Port 1 View  Result Date: 03/10/2021 CLINICAL DATA:  81 year old female positive COVID-19. EXAM: PORTABLE CHEST 1 VIEW COMPARISON:  Chest CT 0223 hours today. FINDINGS: Portable AP semi upright view at 0701 hours. Mildly asymmetric elevated left hemidiaphragm with left lung base opacity which most resembles atelectasis by CT. Stable lung volumes and ventilation since 03/09/2021. No pneumothorax or pulmonary edema. Stable mediastinal contour. Superficial chest cardiac loop recorder. Visualized tracheal air column is within normal limits. No acute osseous abnormality identified. Paucity of bowel gas in the upper abdomen. IMPRESSION: Stable lung base atelectasis greater on the left. Electronically Signed   By: Genevie Ann M.D.   On: 03/10/2021 07:14   DG Chest Port 1 View  Result Date: 03/09/2021 CLINICAL DATA:  Shortness of breath and fever. Patient has not slept for 2 days by report. EXAM: PORTABLE CHEST 1 VIEW COMPARISON:  July 13, 2020 FINDINGS: EKG leads project over the patient's chest as well as a cardiac loop recorder in the soft tissues of the anterior chest. Cardiomediastinal contours are stable. LEFT lower lobe airspace disease in the retrocardiac region. Lungs are otherwise clear. On limited assessment there is no acute skeletal process. IMPRESSION: LEFT lower lobe airspace disease in the retrocardiac region compatible suspicious for  developing infection/pneumonia. Electronically Signed   By: Zetta Bills M.D.   On: 03/09/2021 16:29   CUP PACEART REMOTE DEVICE CHECK  Result Date: 02/28/2021 ILR summary report received. Battery status OK. Normal device function. No new symptom,  brady, or pause episodes.  No new AF episodes. Monthly summary reports and ROV/PRN 2 tachy events, available EGM shows 6sec, regular tachy, rate 182 LA  VAS [place] LOWER EXTREMITY VENOUS (DVT)  Result Date: 03/12/2021  Lower Venous DVT Study Patient Name:  ZABELLA WEASE  Date of Exam:   03/11/2021 Medical Rec #: [id number]         Accession #:    [id number] Date of Birth: 1940/05/12        Patient Gender: F Patient Age:   81 years Exam Location:  [facility] Procedure:      VAS [place] LOWER EXTREMITY VENOUS (DVT) Referring Phys: Deno Etienne [facility] --------------------------------------------------------------------------------  Indications: Elevated d-dimer= 2.69.  Limitations: Body habitus, poor ultrasound/tissue interface and patient unable to tolerate all compression maneuvers. Comparison Study: No prior study Performing Technologist: Maudry Mayhew MHA, RDMS, RVT, RDCS  Examination Guidelines: A complete evaluation includes B-mode imaging, spectral Doppler, color Doppler, and power Doppler as needed of all accessible portions of each vessel. Bilateral testing is considered an integral part of a complete examination. Limited examinations for reoccurring indications may be performed as noted. The reflux portion of the exam is performed with the patient in reverse Trendelenburg.  +---------+---------------+---------+-----------+----------+--------------+  RIGHT     Compressibility Phasicity Spontaneity Properties Thrombus Aging  +---------+---------------+---------+-----------+----------+--------------+  CFV       Full            Yes       Yes                                    +---------+---------------+---------+-----------+----------+--------------+  SFJ       Full                                                             +---------+---------------+---------+-----------+----------+--------------+  FV Prox   Full                                                              +---------+---------------+---------+-----------+----------+--------------+  FV Mid    Full                                                             +---------+---------------+---------+-----------+----------+--------------+  FV Distal Full                                                             +---------+---------------+---------+-----------+----------+--------------+  PFV       Full                                                             +---------+---------------+---------+-----------+----------+--------------+  POP       Full            Yes       Yes                                    +---------+---------------+---------+-----------+----------+--------------+  PTV       Full                      Yes                                    +---------+---------------+---------+-----------+----------+--------------+  PERO      None                      No                     Acute           +---------+---------------+---------+-----------+----------+--------------+   +---------+---------------+---------+-----------+---------------+--------------+  LEFT      Compressibility Phasicity Spontaneity Properties      Thrombus Aging  +---------+---------------+---------+-----------+---------------+--------------+  CFV       Full            Yes       Yes                                         +---------+---------------+---------+-----------+---------------+--------------+  SFJ       Full                                                                  +---------+---------------+---------+-----------+---------------+--------------+  FV Prox   Full                                                                  +---------+---------------+---------+-----------+---------------+--------------+  FV Mid    Full                                                                  +---------+---------------+---------+-----------+---------------+--------------+  FV Distal  Yes         patent by color                                                                   Doppler                         +---------+---------------+---------+-----------+---------------+--------------+  PFV       Full                                                                  +---------+---------------+---------+-----------+---------------+--------------+  POP       Full                                                                  +---------+---------------+---------+-----------+---------------+--------------+  PTV       Full                                                                  +---------+---------------+---------+-----------+---------------+--------------+  PERO      Full                                                                  +---------+---------------+---------+-----------+---------------+--------------+     Summary: RIGHT: - Findings consistent with acute deep vein thrombosis involving the right peroneal veins. - No cystic structure found in the popliteal fossa.  LEFT: - There is no evidence of deep vein thrombosis in the lower extremity. However, portions of this examination were limited- see technologist comments above.  - No cystic structure found in the popliteal fossa.  *See table(s) above for measurements and observations. Electronically signed by Harold Barban MD on 03/12/2021 at 5:29:43 PM.    Final    CT Super D Chest Wo Contrast  Result Date: 02/15/2021 CLINICAL DATA:  Evaluate pulmonary nodule EXAM: CT CHEST WITHOUT CONTRAST TECHNIQUE: Multidetector CT imaging of the chest was performed using thin slice collimation for electromagnetic bronchoscopy planning purposes, without intravenous contrast. COMPARISON:  Chest CT dated July 22, 2020 FINDINGS: Cardiovascular: No pericardial effusion. Left main and three-vessel coronary artery calcifications. Atherosclerotic disease of the thoracic aorta. Mediastinum/Nodes: No pathologically enlarged lymph nodes seen in the chest. Esophagus is unremarkable.  Lungs/Pleura: Central airways are patent. Solid juxtapleural pulmonary nodule of the right lower lobe is slightly increased in size when compared with prior exam (remeasured  in similar plane) measures 5.8 mm in mean diameter, previously 4.8 mm. Additional smaller nodule is seen in the right lower lobe measuring 3 mm on series 3 image 36, unchanged in size when compared to prior exam. No consolidation, pleural effusion or pneumothorax. Upper Abdomen: Cholecystectomy clips. Bilateral low-attenuation lesions the kidneys which are likely simple cysts. High attenuation exophytic lesion of the right kidney which is likely a proteinaceous cyst. No acute abnormality. Musculoskeletal: No chest wall mass or suspicious bone lesions identified. IMPRESSION: 1. 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 Recommend continued surveillance. 2. Aortic Atherosclerosis (ICD10-I70.0). Electronically Signed   By: Yetta Glassman M.D.   On: 02/15/2021 08:58     Today   Subjective    [name] [name] today has no headache,no chest abdominal pain,no new weakness tingling or numbness, feels much better wants to go home today.     Objective   Blood pressure (!) 159/74, pulse 78, temperature 98 F (36.7 C), temperature source Oral, resp. rate 16, SpO2 98 %.   Intake/Output Summary (Last 24 hours) at 03/13/2021 0941 Last data filed at 03/13/2021 0620 Gross per 24 hour  Intake 420 ml  Output 1450 ml  Net -1030 ml    Exam  Awake Alert, No new F.N deficits,    [place].AT,PERRAL Supple Neck,   Symmetrical Chest wall movement, Good air movement bilaterally, CTAB RRR,No Gallops,   +ve B.Sounds, Abd Soft, Non tender,  No Cyanosis, Clubbing or edema    Data Review   CBC w Diff:  Lab Results  Component Value Date   WBC 11.8 (H) 03/13/2021   HGB 8.2 (L) 03/13/2021   HGB 11.9 12/29/2019   HCT  28.5 (L) 03/13/2021   HCT 36.4 12/29/2019   PLT 349 03/13/2021   PLT 312 12/29/2019   LYMPHOPCT 9 03/13/2021   MONOPCT 5 03/13/2021   EOSPCT 1 03/13/2021   BASOPCT 0 03/13/2021    CMP:  Lab Results  Component Value Date   NA 137 03/13/2021   NA 144 03/01/2021   K 3.9 03/13/2021   CL 103 03/13/2021   CO2 26 03/13/2021   BUN 36 (H) 03/13/2021   BUN 16 03/01/2021   CREATININE 1.14 (H) 03/13/2021   PROT 6.2 (L) 03/13/2021   PROT 7.1 12/29/2019   ALBUMIN 3.2 (L) 03/13/2021   ALBUMIN 4.1 12/29/2019   BILITOT 0.4 03/13/2021   BILITOT 0.3 12/29/2019   ALKPHOS 87 03/13/2021   AST 20 03/13/2021   ALT 19 03/13/2021  . Lab Results  Component Value Date   HGBA1C 8.0 (H) 03/12/2021     Total Time in preparing paper work, data evaluation and todays exam - 107 minutes  Lala Lund M.D on 03/13/2021 at 9:41 AM  [facility]

## 2021-03-13 NOTE — NC FL2 (Signed)
[place] LEVEL OF CARE SCREENING TOOL     IDENTIFICATION  Patient Name: [name] [name] Birthdate: 13-Mar-1940 Sex: female Admission Date (Current Location): 03/09/2021  [facility] and [place] Number:  Herbalist and Address:  [facility] [place], [place] [address] [place], [place] [address]      Provider Number: [id number]  Attending Physician Name and Address:  Thurnell Lose, MD  Relative Name and Phone Number:       Current Level of Care: Hospital Recommended Level of Care: [place] Prior Approval Number:    Date Approved/Denied:   PASRR Number:    Discharge Plan: Other (Comment) (ALF)    Current Diagnoses: Patient Active Problem List   Diagnosis Date Noted   Acute respiratory failure with hypoxia ([place]) 03/10/2021   COVID-19 virus infection 03/09/2021   Hypertension 02/04/2021   At risk for allergic reaction to medication 02/04/2021   Irritation of right eye 12/09/2020   Lactose intolerance 11/11/2020   Pulmonary nodules 10/21/2020   Encounter for Medicare annual wellness exam 07/01/2020   Neuropathy 07/01/2020   COVID-19 vaccine series started 07/01/2020   Need for shingles vaccine 07/01/2020   Encounter to establish care 06/03/2020   Encounter for medication management 06/03/2020   Coronary artery disease involving native heart without angina pectoris 12/18/2019   History of malignant melanoma 12/18/2019   Basal cell carcinoma of skin 12/18/2019   History of surgery for cerebral aneurysm 12/18/2019   Mixed hyperlipidemia 05/22/2019   Prediabetes 05/22/2019   Essential hypertension 05/19/2019   Adjustment disorder with mixed anxiety and depressed mood 05/19/2019   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 05/19/2019   Bilateral carotid artery occlusion 05/19/2019   Anemia 05/19/2019   Bipolar disorder ([place]) 05/19/2019   Chronic obstructive lung disease ([place])  05/19/2019   Chronic pain syndrome 05/19/2019   Gastroesophageal reflux disease without esophagitis 05/19/2019   Generalized anxiety disorder 05/19/2019   History of opioid abuse ([place]) 05/19/2019   Hyperglycemia 05/19/2019   Hypertensive heart disease without congestive heart failure 05/19/2019   Obstructive sleep apnea syndrome 05/19/2019   Polyp of colon 05/19/2019   Depression, major, single episode, moderate ([place]) 05/19/2019   Spinal stenosis in cervical region 05/19/2019   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   Transient ischemic attack 05/19/2019   Mixed urinary incontinence due to female genital prolapse 05/19/2019   Tricuspid valve disorders, 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   Carotid artery plaque, 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   Panic disorder with agoraphobia 02/22/2018   Female stress incontinence 02/14/2018   Diverticulosis of large intestine 02/14/2018   Localized, primary osteoarthritis of shoulder region 02/14/2018   Mitral valve regurgitation 02/14/2018   Grade I internal hemorrhoids 02/14/2018   Bilateral carpal tunnel syndrome 02/11/2018   Menopause present 02/11/2018   Morbid obesity ([place]) 02/11/2018   Nondependent opioid abuse in remission ([place]) 02/11/2018   Closed fracture of upper end of humerus 11/03/2017    Orientation RESPIRATION BLADDER Height & Weight     Self, Situation, Place, Time  Normal Incontinent Weight:   Height:     BEHAVIORAL SYMPTOMS/MOOD NEUROLOGICAL BOWEL NUTRITION STATUS      Continent Diet (Normal)  AMBULATORY STATUS COMMUNICATION OF NEEDS Skin   Limited Assist Verbally Normal                       Personal Care Assistance Level of Assistance  Bathing, Feeding, Dressing Bathing Assistance: Maximum assistance  Feeding assistance: Independent Dressing Assistance: Limited assistance     Functional Limitations Info  Sight, Hearing, Speech Sight Info: Adequate Hearing Info: Adequate Speech Info: Adequate    SPECIAL CARE FACTORS  FREQUENCY  PT (By licensed PT)     PT Frequency: Home Health PT              Contractures Contractures Info: Not present    Additional Factors Info  Code Status, Allergies, Psychotropic, Isolation Precautions Code Status Info: Full Allergies Info: Azithromycin, Bee Venom, Erythromycin Base, Metronidazole, Nitrofurantoin Macrocrystal, Penicillins, Sulfa Antibiotics, Cymbalta (Duloxetine Hcl), Ivp Dye (Iodinated Contrast Media), Penicillin G, Codeine, Erythromycin, Fosfomycin, Levofloxacin, Nitrofurantoin, Percocet (Oxycodone-acetaminophen) Psychotropic Info: Xanax, Paxil   Isolation Precautions Info: COVID +     Current Medications (03/13/2021):  This is the current hospital active medication list Current Facility-Administered Medications  Medication Dose Route Frequency Provider Last Rate Last Admin   acetaminophen (TYLENOL) tablet 650 mg  650 mg Oral Q6H PRN Kristopher Oppenheim, DO       Or   acetaminophen (TYLENOL) suppository 650 mg  650 mg Rectal Q6H PRN Kristopher Oppenheim, DO       albuterol (PROVENTIL) (2.5 MG/3ML) 0.083% nebulizer solution 3 mL  3 mL Inhalation Q4H PRN Thurnell Lose, MD       ALPRAZolam Duanne Moron) tablet 0.25 mg  0.25 mg Oral BID Kristopher Oppenheim, DO   0.25 mg at 03/12/21 1618   apixaban (ELIQUIS) tablet 10 mg  10 mg Oral BID Mignon Pine, RPH   10 mg at 03/13/21 2694   Followed by   Derrill Memo ON 03/18/2021] apixaban (ELIQUIS) tablet 5 mg  5 mg Oral BID Mignon Pine, [facility]       aspirin EC tablet 81 mg  81 mg Oral Daily Kristopher Oppenheim, DO   81 mg at 03/13/21 8546   atorvastatin (LIPITOR) tablet 10 mg  10 mg Oral Daily Thurnell Lose, MD   10 mg at 03/13/21 2703   carvedilol (COREG) tablet 3.125 mg  3.125 mg Oral Once Thurnell Lose, MD       carvedilol (COREG) tablet 6.25 mg  6.25 mg Oral BID WC Thurnell Lose, MD       diphenhydrAMINE (BENADRYL) capsule 25 mg  25 mg Oral Q8H PRN Thurnell Lose, MD   25 mg at 03/13/21 0936   famotidine (PEPCID) tablet 20 mg  20  mg Oral Daily PRN Thurnell Lose, MD       fluticasone furoate-vilanterol (BREO ELLIPTA) 100-25 MCG/ACT 1 puff  1 puff Inhalation Daily Thurnell Lose, MD   1 puff at 03/13/21 0810   And   umeclidinium bromide (INCRUSE ELLIPTA) 62.5 MCG/ACT 1 puff  1 puff Inhalation Daily Thurnell Lose, MD   1 puff at 03/13/21 0810   guaiFENesin-dextromethorphan (ROBITUSSIN DM) 100-10 MG/5ML syrup 15 mL  15 mL Oral Q4H PRN Kristopher Oppenheim, DO       hydrALAZINE (APRESOLINE) injection 10 mg  10 mg Intravenous Q6H PRN Thurnell Lose, MD   10 mg at 03/12/21 1136   hydrALAZINE (APRESOLINE) tablet 25 mg  25 mg Oral BID Thurnell Lose, MD   25 mg at 03/13/21 0908   insulin aspart (novoLOG) injection 0-15 Units  0-15 Units Subcutaneous TID WC Thurnell Lose, MD   3 Units at 03/13/21 0910   insulin aspart (novoLOG) injection 0-5 Units  0-5 Units Subcutaneous QHS Thurnell Lose, MD       ipratropium-albuterol (  DUONEB) 0.5-2.5 (3) MG/3ML nebulizer solution 3 mL  3 mL Nebulization BID Thurnell Lose, MD   3 mL at 03/13/21 0810   living well with diabetes book MISC   Does not apply Once Thurnell Lose, MD       melatonin tablet 10 mg  10 mg Oral QHS PRN Kristopher Oppenheim, DO       mirtazapine (REMERON) tablet 7.5 mg  7.5 mg Oral QHS Lala Lund K, MD   7.5 mg at 03/12/21 2138   ondansetron (ZOFRAN) tablet 4 mg  4 mg Oral Q6H PRN Kristopher Oppenheim, DO       Or   ondansetron [facility]) injection 4 mg  4 mg Intravenous Q6H PRN Kristopher Oppenheim, DO       pantoprazole (PROTONIX) EC tablet 40 mg  40 mg Oral Daily Kristopher Oppenheim, DO   40 mg at 03/13/21 9179   PARoxetine (PAXIL) tablet 40 mg  40 mg Oral Daily Thurnell Lose, MD   40 mg at 03/13/21 0908     Discharge Medications: Please see discharge summary for a list of discharge medications.  Relevant Imaging Results:  Relevant Lab Results:   Additional Information SSN: [id number].  Archie Endo, LCSW

## 2021-03-13 NOTE — Progress Notes (Signed)
Inpatient Diabetes Program Recommendations  AACE/ADA: New Consensus Statement on Inpatient Glycemic Control (2015)  Target Ranges:  Prepandial:   less than 140 mg/dL      Peak postprandial:   less than 180 mg/dL (1-2 hours)      Critically ill patients:  140 - 180 mg/dL   Lab Results  Component Value Date   GLUCAP 154 (H) 03/13/2021   HGBA1C 8.0 (H) 03/12/2021    Referral placed this morning for DM and insulin teaching.  Appears MD is discharging on Metformin 500 mg and to follow up with PCP.  Ordered LWWD.    Thank you, Reche Dixon, MSN, RN Diabetes Coordinator Inpatient Diabetes Program [phone] (team pager from 8a-5p)

## 2021-03-13 NOTE — Care Management (Signed)
Provided patient with Eliquis card, CSW arranging DC to ALF.

## 2021-03-13 NOTE — Progress Notes (Signed)
RN and patient family at bedside. Patient appears calm and free of pain. Discharge summary given to patient per discharge orders. All questions by patient and patient family answered by RN. Telemonitoring connections + IV removed per policy. Patient changed into personal clothing with assistance. Patient transported to hospital entrance by RN. Charge RN aware

## 2021-03-13 NOTE — Discharge Instructions (Signed)
Follow with Primary MD Early, Coralee Pesa, NP in 7 days   Get CBC, CMP, Magnesium, TSH, A1c, 2 view Chest X ray -  checked next visit within 1 week by Primary MD    Activity: As tolerated with Full fall precautions use walker/cane & assistance as needed  Disposition Home    Diet: Heart Healthy Low Carb  Special Instructions: If you have smoked or chewed Tobacco  in the last 2 yrs please stop smoking, stop any regular Alcohol  and or any Recreational drug use.  On your next visit with your primary care physician please Get Medicines reviewed and adjusted.  Please request your Prim.MD to go over all Hospital Tests and Procedure/Radiological results at the follow up, please get all Hospital records sent to your Prim MD by signing hospital release before you go home.  If you experience worsening of your admission symptoms, develop shortness of breath, life threatening emergency, suicidal or homicidal thoughts you must seek medical attention immediately by calling 911 or calling your MD immediately  if symptoms less severe.  You Must read complete instructions/literature along with all the possible adverse reactions/side effects for all the Medicines you take and that have been prescribed to you. Take any new Medicines after you have completely understood and accpet all the possible adverse reactions/side effects.

## 2021-03-13 NOTE — Plan of Care (Signed)

## 2021-03-13 NOTE — TOC Transition Note (Signed)
Transition of Care [facility]) - CM/SW Discharge Note   Patient Details  Name: [name] [name] MRN: [id number] Date of Birth: 1940/03/06  [facility]) CM/SW Contact:  Gabrielle Dare Phone Number: 03/13/2021, 11:15 AM   Clinical Narrative:    Patient will Discharge To: [facility] Anticipated DC Date:03/13/21 Family Notified:yes, son, Shaquana Buel [phone] Transport By: Marlene Bast   Per MD patient ready for DC to  [facility]. RN, patient, patient's family, and facility notified of DC. Assessment, Fl2/Pasrr, and Discharge Summary sent to facility. RN given number for report [phone]). DC packet on chart. Patient will b transported by son's car.   CSW signing off.  Reed Breech LCSWA [phone]     Final next level of care: Assisted Living Barriers to Discharge: No Barriers Identified   Patient Goals and CMS Choice Patient states their goals for this hospitalization and ongoing recovery are:: Return to ALF CMS Medicare.gov Compare Post Acute Care list provided to:: Patient Choice offered to / list presented to : Patient  Discharge Placement              Patient chooses bed at:  ([facility]) Patient to be transferred to facility by: Car (Son) Name of family member notified: Starlynn Klinkner Patient and family notified of of transfer: 03/13/21  Discharge Plan and Services In-house Referral: Clinical Social Work   Post Acute Care Choice: Home Health                               Social Determinants of Health (SDOH) Interventions     Readmission Risk Interventions No flowsheet data found.

## 2021-03-14 LAB — CULTURE, BLOOD (ROUTINE X 2)
Culture: NO GROWTH
Culture: NO GROWTH
Special Requests: ADEQUATE
Special Requests: ADEQUATE

## 2021-03-15 ENCOUNTER — Other Ambulatory Visit (HOSPITAL_BASED_OUTPATIENT_CLINIC_OR_DEPARTMENT_OTHER): Payer: Self-pay

## 2021-03-15 MED ORDER — BENZONATATE 200 MG PO CAPS: 200.0000 mg | ORAL_CAPSULE | Freq: Three times a day (TID) | ORAL | 2 refills | Status: DC | PRN

## 2021-03-24 ENCOUNTER — Encounter (HOSPITAL_BASED_OUTPATIENT_CLINIC_OR_DEPARTMENT_OTHER): Payer: Self-pay | Admitting: Nurse Practitioner

## 2021-03-24 ENCOUNTER — Ambulatory Visit (INDEPENDENT_AMBULATORY_CARE_PROVIDER_SITE_OTHER): Payer: Medicare HMO | Admitting: Nurse Practitioner

## 2021-03-24 ENCOUNTER — Ambulatory Visit (HOSPITAL_BASED_OUTPATIENT_CLINIC_OR_DEPARTMENT_OTHER)
Admission: RE | Admit: 2021-03-24 | Discharge: 2021-03-24 | Disposition: A | Discharge status: Home / Self Care | Payer: Medicare HMO | Source: Ambulatory Visit | Attending: Nurse Practitioner | Admitting: Nurse Practitioner

## 2021-03-24 ENCOUNTER — Other Ambulatory Visit: Payer: Self-pay

## 2021-03-24 VITALS — BP 128/82 | HR 107 | Ht 65.0 in | Wt 282.7 lb

## 2021-03-24 DIAGNOSIS — Z09 Encounter for follow-up examination after completed treatment for conditions other than malignant neoplasm: Secondary | ICD-10-CM | POA: Insufficient documentation

## 2021-03-24 DIAGNOSIS — I10 Essential (primary) hypertension: Secondary | ICD-10-CM

## 2021-03-24 DIAGNOSIS — R052 Subacute cough: Secondary | ICD-10-CM | POA: Diagnosis not present

## 2021-03-24 DIAGNOSIS — J449 Chronic obstructive pulmonary disease, unspecified: Secondary | ICD-10-CM

## 2021-03-24 DIAGNOSIS — J9601 Acute respiratory failure with hypoxia: Secondary | ICD-10-CM

## 2021-03-24 DIAGNOSIS — E1165 Type 2 diabetes mellitus with hyperglycemia: Secondary | ICD-10-CM

## 2021-03-24 HISTORY — DX: Subacute cough: R05.2

## 2021-03-24 HISTORY — DX: Encounter for follow-up examination after completed treatment for conditions other than malignant neoplasm: Z09

## 2021-03-24 MED ORDER — DAPAGLIFLOZIN PROPANEDIOL 5 MG PO TABS: 5.0000 mg | ORAL_TABLET | Freq: Every day | ORAL | 6 refills | Status: DC

## 2021-03-24 MED ORDER — BLOOD GLUCOSE MONITOR KIT: PACK | 99 refills | Status: AC

## 2021-03-24 MED ORDER — GUAIFENESIN ER 600 MG PO TB12: 1200.0000 mg | ORAL_TABLET | Freq: Two times a day (BID) | ORAL | 1 refills | Status: DC | PRN

## 2021-03-24 NOTE — Progress Notes (Signed)
Established Patient Office Visit  Subjective:  Patient ID: [name] [name], female    DOB: 10/10/1940  Age: 81 y.o. MRN: [id number]  CC:  Chief Complaint  Patient presents with   Follow-up    Patient presents today for follow up from hospital. She is feeling better, just gets very tired easily. Patient states she has a cough. While in the hospital they discovered she is diabetic. She was put on Metformin she feel this has been a nightmare due to the loose stools. She lives at [facility] and they are not monitoring her blood sugar.     HPI [name] [name] presents for hospital follow-up for recent COVID-1 and pneumonia infection as well as new dx of DM while hospitalized.   [name] [name] tells me that she is feeling better since her hospitalization but she is still fatigued.  She also still has a lingering wet sounding cough which she cannot get to go away.  She denies fever, chills, body aches, sinus pain or pressure, shortness of breath.  Her biggest concern is the lingering cough.  Hospital discharge summary does recommend repeat chest x-ray at follow-up.  During her hospitalization Hogan was found to be diabetic and was started on metformin daily.  She reports that since starting the metformin 500 mg twice a day she is having severe diarrhea.  She tells me that the diarrhea is so bad that she is unable to leave her apartment for several hours after she takes the medication.  This is quite concerning for her.  She is not checking her blood sugars at home.  She reports she was not provided with any instructions on monitoring blood sugar or provided with a glucometer at discharge. She lives at [facility] nursing facility and they do manage her medication and treatments and they were not provided with any information on this as well.  She is not sure if her diet is diabetic compliant as she does eat meals prepared by [facility].  She denies any increased thirst or urination at this  time.  Outpatient Medications Prior to Visit  Medication Sig Dispense Refill   albuterol (VENTOLIN HFA) 108 (90 Base) MCG/ACT inhaler Inhale 1-2 puffs into the lungs every 4 (four) hours as needed for wheezing or shortness of breath. 18 g 1   ALPRAZolam (XANAX) 0.25 MG tablet TAKE (1) TABLET BY MOUTH TWICE DAILY AT 11AM & 5PM FOR ANXIETY. (Patient taking differently: Take 0.25 mg by mouth 2 (two) times daily. 1100 and 1500 And an extra 0.19m oral twice daily as needed for anxiety) 60 tablet 0   AMBULATORY NON FORMULARY MEDICATION One four wheel rolling walker with seat and hand brakes. Brand/style covered by insurance. Medically necessary for ICD-10: R26.81, Z74.1 1 Units 0   apixaban (ELIQUIS) 5 MG TABS tablet Take 1 tablet (5 mg total) by mouth 2 (two) times daily. 60 tablet 0   ARTHRITIS PAIN RELIEVING 0.075 % topical cream Apply 1 application topically 3 (three) times daily as needed (neuropathic pain in feet).     atorvastatin (LIPITOR) 10 MG tablet Take 10 mg by mouth daily.     Bacillus Coagulans-Inulin (PROBIOTIC FORMULA) 1-250 BILLION-MG CAPS Take by mouth.     benzonatate (TESSALON) 200 MG capsule Take 1 capsule (200 mg total) by mouth 3 (three) times daily as needed for cough. 30 capsule 2   camphor-menthol (SARNA) lotion Apply 1 application topically as needed for itching. (Patient taking differently: Apply 1 application topically 2 (two) times daily as  needed for itching.) 222 mL 0   carvedilol (COREG) 6.25 MG tablet Take 1 tablet (6.25 mg total) by mouth 2 (two) times daily with a meal. 60 tablet 0   cetirizine (ZYRTEC) 10 MG tablet Take 10 mg by mouth daily.     cloNIDine (CATAPRES) 0.2 MG tablet Take 0.2 mg by mouth See admin instructions. Take 0.23m by mouth once every hour as needed for bp greater than 190- Max 0.626m    diphenhydrAMINE (BENADRYL) 25 mg capsule Take 1 capsule (25 mg total) by mouth every 8 (eight) hours as needed for itching or allergies. 30 capsule 1   EASYMAX  TEST test strip      estradiol (ESTRACE) 0.1 MG/GM vaginal cream Place 0.5g nightly for two weeks then twice a week after (Patient taking differently: Place 1 Applicatorful vaginally 2 (two) times a week. Mondays and Thursdays) 30 g 11   famotidine (PEPCID) 20 MG tablet Take 1 tablet (20 mg total) by mouth daily as needed for heartburn or indigestion. 90 tablet 3   hydrALAZINE (APRESOLINE) 25 MG tablet Take 25 mg by mouth 2 (two) times daily.     Lactase 9000 units TABS Take one tablet with the first bite of meals containing milk or dairy products. (Patient taking differently: Take 9,000 Units by mouth See admin instructions. Take 9000units with the first bite of meals containing milk or dairy products.) 90 tablet 11   metFORMIN (GLUCOPHAGE) 500 MG tablet Take 1 tablet (500 mg total) by mouth 2 (two) times daily with a meal. 60 tablet 0   mirtazapine (REMERON) 7.5 MG tablet Take 1 tablet (7.5 mg total) by mouth at bedtime. For depression and anxiety symptoms 30 tablet 2   pantoprazole (PROTONIX) 40 MG tablet TAKE ONE TABLET BY MOUTH EVERY MORNING FOR GERD (Patient taking differently: Take 40 mg by mouth daily.) 30 tablet 0   PARoxetine (PAXIL) 40 MG tablet Take 40 mg by mouth daily.     TRELEGY ELLIPTA 100-62.5-25 MCG/ACT AEPB INHALE 1 PUFF INTO THE LUNGS DAILY. ** RINSE MOUTH AFTER USE** (Patient taking differently: Inhale 1 puff into the lungs daily.) 60 each 0   apixaban (ELIQUIS) 5 MG TABS tablet Take 2 tablets (10 mg total) by mouth 2 (two) times daily for 4 days. 16 tablet 0   Probiotic Product (PROBIOTIC ACIDOPHILUS BEADS) CAPS Take 250 mg by mouth 2 (two) times daily.     No facility-administered medications prior to visit.    Allergies  Allergen Reactions   Azithromycin Anaphylaxis and Hives   Bee Venom Anaphylaxis   Erythromycin Base Anaphylaxis   Metronidazole Anaphylaxis   Nitrofurantoin Macrocrystal Anaphylaxis   Penicillins Anaphylaxis and Hives    childhood   Sulfa Antibiotics  Anaphylaxis and Hives    Childhood   Cymbalta [Duloxetine Hcl]    Ivp Dye [Iodinated Contrast Media]    Penicillin G Hives   Codeine Rash   Erythromycin Rash and Hives   Fosfomycin Rash   Levofloxacin Rash   Nitrofurantoin Hives   Percocet [Oxycodone-Acetaminophen] Rash    ROS Review of Systems All review of systems negative except what is listed in the HPI    Objective:    Physical Exam Vitals and nursing note reviewed.  Constitutional:      Appearance: Normal appearance.  HENT:     Head: Normocephalic.  Eyes:     Extraocular Movements: Extraocular movements intact.     Conjunctiva/sclera: Conjunctivae normal.     Pupils: Pupils are equal, round,  and reactive to light.  Neck:     Vascular: No carotid bruit.  Cardiovascular:     Rate and Rhythm: Normal rate and regular rhythm.     Pulses: Normal pulses.     Heart sounds: Normal heart sounds.  Pulmonary:     Effort: Pulmonary effort is normal.     Breath sounds: Rhonchi present.  Chest:     Chest wall: No tenderness.  Abdominal:     General: Bowel sounds are normal. There is no distension.     Palpations: Abdomen is soft.     Tenderness: There is no abdominal tenderness. There is no guarding.  Musculoskeletal:     Cervical back: Normal range of motion.     Right lower leg: No edema.     Left lower leg: No edema.  Skin:    General: Skin is warm and dry.     Capillary Refill: Capillary refill takes less than 2 seconds.  Neurological:     General: No focal deficit present.     Mental Status: She is alert and oriented to person, place, and time. Mental status is at baseline.     Motor: Weakness present.     Gait: Gait abnormal.  Psychiatric:        Mood and Affect: Mood normal.        Behavior: Behavior normal.        Thought Content: Thought content normal.        Judgment: Judgment normal.    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  Recent COVID-19 infection with hospitalization.  Continue current inhalers.  Restart Mucinex 1200 mg twice a day. No signs of infection present today.  Lungs do sound coarse and rhonchorous with wet cough however patient is still recovering from COVID-19 virus and this is likely causing symptoms.  Oxygenation status is stable with no signs of shortness of breath.  Will obtain chest x-ray today for further evaluation given ongoing cough.      Relevant Medications   guaiFENesin (MUCINEX) 600 MG 12 hr tablet   Hypertension    Recent changes to blood pressure medication during hospitalization.  Blood pressure today appears well controlled at 128/82. Instructions provided for [facility] to continue with daily blood pressure monitoring and notify if blood pressure readings are above 140/90 as medication changes will need to be made at that time. We will continue collaboration with cardiology.      Acute respiratory failure with hypoxia (HCC)    Resolved.  Oxygen level stable on room air.      Hospital discharge follow-up - Primary    Recent hospitalization for COVID-19 with new diagnosis of type 2 diabetes. Discussed medication changes with patient today and ensured accurate instructions were provided to [facility]. Discussed importance of following diabetic diet and how to avoid eating excessive carbs.  Patient also provided with handout on diabetes today. Patient provided with prescription for blood glucose monitor kit with instructions on monitoring  and what to do in the event of high or low blood glucose measures. All instructions also faxed to [facility] for nursing staff to be aware. At this time patient appears stable.   We will obtain labs and additional chest x-ray today for evaluation. Recommend follow-up if symptoms worsen or fail to improve.      Relevant Orders   DG Chest 2 View (Completed)   CBC with Differential/Platelet (Completed)   Comprehensive metabolic panel (Completed)   TSH (Completed)   Subacute cough    Ongoing cough following recent COVID-19 infection with pneumonia. Chest x-ray today shows no concerning findings.  Suspect this is related to postinfectious mucus production and inflammation in the setting of COPD. Recommend continuation of current inhalers and adding back Mucinex 1200 mg twice a day.  Discussed importance of coughing and deep breathing exercises with patient and encouraged her to work on increasing her movement.  No alarm signs present at this time.  Discussed with patient if she begins to notice any signs of increasing cough, shortness of breath, fevers, chills, body aches, or increased fatigue to notify immediately or seek emergency care.      Relevant Medications   guaiFENesin (MUCINEX) 600 MG 12 hr tablet   Type 2 diabetes mellitus with hyperglycemia, without long-term current use of insulin (HCC)    New diagnosis of type 2 diabetes while hospitalized for COVID-19. Discussion with patient about the condition and medication management. Patient is not able to tolerate metformin at this time therefore we will discontinue use of this.  Given the patient's comorbid conditions recommend starting Farxiga and monitoring blood sugars closely.  Patient provided with instructions on blood glucose monitoring and what to do if blood sugars are high or low.  Information also faxed to [facility]. Recommend diabetic diet with no more than 180 g of carbohydrates a day.  Also recommend increased physical activity to help with blood sugar control and chronic conditions. We will continue to monitor closely and make recommendations to changes in the plan of care as necessary based on  findings.      Relevant Medications   dapagliflozin propanediol (FARXIGA) 5 MG TABS tablet   blood glucose meter kit and supplies KIT    Meds ordered this encounter  Medications   guaiFENesin (MUCINEX) 600 MG 12 hr tablet    Sig: Take 2 tablets (1,200 mg total) by mouth 2 (two) times daily as needed for to loosen phlegm or cough. Use for 7 days then as needed if cough is improving.    Dispense:  30 tablet    Refill:  1   dapagliflozin propanediol (FARXIGA) 5 MG TABS tablet    Sig: Take 1 tablet (5 mg total) by mouth daily before breakfast.    Dispense:  30 tablet    Refill:  6   blood glucose meter kit and supplies KIT    Sig: Meter, test strips, lancets, and alcohol swabs. Use for testing blood sugar every morning before meals and up to 3 additional times per day as needed. Brand based on patient and insurance coverage. 100 strips with 99 refills, 100 lancets with 99 refills, 200 alcohol swabs with 99 refills. Dx: E11.65    Dispense:  1 each    Refill:  99    Order Specific Question:   Number of strips    Answer:   100    Order Specific Question:   Number of lancets    Answer:   100  Follow-up: Return for Follow-up scheduled 3/30.    Orma Render, NP

## 2021-03-24 NOTE — Patient Instructions (Signed)
I have changed your medications today.   I want you to stop:  Metformin  I want you to start:  Farxiga 5mg  once a day  Mucinex 1200mg  twice a day for 2 weeks then as needed.  I want your to have the staff check your blood sugar at least once in the morning before breakfast. They can check your blood sugar at other times during the day if you are having any symptoms of low blood sugar (shaking, sweating, nausea, fatigue).

## 2021-03-25 ENCOUNTER — Telehealth (HOSPITAL_BASED_OUTPATIENT_CLINIC_OR_DEPARTMENT_OTHER): Payer: Self-pay

## 2021-03-25 ENCOUNTER — Encounter (HOSPITAL_BASED_OUTPATIENT_CLINIC_OR_DEPARTMENT_OTHER): Payer: Self-pay | Admitting: Nurse Practitioner

## 2021-03-25 DIAGNOSIS — K219 Gastro-esophageal reflux disease without esophagitis: Secondary | ICD-10-CM

## 2021-03-25 DIAGNOSIS — D649 Anemia, unspecified: Secondary | ICD-10-CM

## 2021-03-25 LAB — COMPREHENSIVE METABOLIC PANEL
ALT: 12 IU/L (ref 0–32)
AST: 20 IU/L (ref 0–40)
Albumin/Globulin Ratio: 1.4 (ref 1.2–2.2)
Albumin: 3.8 g/dL (ref 3.7–4.7)
Alkaline Phosphatase: 112 IU/L (ref 44–121)
BUN/Creatinine Ratio: 15 (ref 12–28)
BUN: 14 mg/dL (ref 8–27)
Bilirubin Total: 0.3 mg/dL (ref 0.0–1.2)
CO2: 23 mmol/L (ref 20–29)
Calcium: 9.2 mg/dL (ref 8.7–10.3)
Chloride: 104 mmol/L (ref 96–106)
Creatinine, Ser: 0.94 mg/dL (ref 0.57–1.00)
Globulin, Total: 2.7 g/dL (ref 1.5–4.5)
Glucose: 186 mg/dL — ABNORMAL HIGH (ref 70–99)
Potassium: 4.6 mmol/L (ref 3.5–5.2)
Sodium: 142 mmol/L (ref 134–144)
Total Protein: 6.5 g/dL (ref 6.0–8.5)
eGFR: 61 mL/min/{1.73_m2} (ref >59)

## 2021-03-25 LAB — CBC WITH DIFFERENTIAL/PLATELET
Basophils Absolute: 0 10*3/uL (ref 0.0–0.2)
Basos: 0 %
EOS (ABSOLUTE): 0.2 10*3/uL (ref 0.0–0.4)
Eos: 2 %
Hematocrit: 30.4 % — ABNORMAL LOW (ref 34.0–46.6)
Hemoglobin: 8.9 g/dL — ABNORMAL LOW (ref 11.1–15.9)
Immature Grans (Abs): 0.1 10*3/uL (ref 0.0–0.1)
Immature Granulocytes: 1 %
Lymphocytes Absolute: 1.7 10*3/uL (ref 0.7–3.1)
Lymphs: 15 %
MCH: 20.4 pg — ABNORMAL LOW (ref 26.6–33.0)
MCHC: 29.3 g/dL — ABNORMAL LOW (ref 31.5–35.7)
MCV: 70 fL — ABNORMAL LOW (ref 79–97)
Monocytes Absolute: 0.9 10*3/uL (ref 0.1–0.9)
Monocytes: 8 %
Neutrophils Absolute: 8.4 10*3/uL — ABNORMAL HIGH (ref 1.4–7.0)
Neutrophils: 74 %
Platelets: 348 10*3/uL (ref 150–450)
RBC: 4.36 x10E6/uL (ref 3.77–5.28)
RDW: 17.9 % — ABNORMAL HIGH (ref 11.7–15.4)
WBC: 11.3 10*3/uL — ABNORMAL HIGH (ref 3.4–10.8)

## 2021-03-25 LAB — TSH: TSH: 2.18 u[IU]/mL (ref 0.450–4.500)

## 2021-03-25 MED ORDER — IRON (FERROUS SULFATE) 325 (65 FE) MG PO TABS: 325.0000 mg | ORAL_TABLET | Freq: Every day | ORAL | 0 refills | Status: DC

## 2021-03-25 NOTE — Assessment & Plan Note (Signed)
New diagnosis of type 2 diabetes while hospitalized for COVID-19. Discussion with patient about the condition and medication management. Patient is not able to tolerate metformin at this time therefore we will discontinue use of this.  Given the patient's comorbid conditions recommend starting Farxiga and monitoring blood sugars closely.  Patient provided with instructions on blood glucose monitoring and what to do if blood sugars are high or low.  Information also faxed to [facility]. Recommend diabetic diet with no more than 180 g of carbohydrates a day.  Also recommend increased physical activity to help with blood sugar control and chronic conditions. We will continue to monitor closely and make recommendations to changes in the plan of care as necessary based on findings.

## 2021-03-25 NOTE — Assessment & Plan Note (Signed)
Ongoing cough following recent COVID-19 infection with pneumonia. Chest x-ray today shows no concerning findings.  Suspect this is related to postinfectious mucus production and inflammation in the setting of COPD. Recommend continuation of current inhalers and adding back Mucinex 1200 mg twice a day.  Discussed importance of coughing and deep breathing exercises with patient and encouraged her to work on increasing her movement.  No alarm signs present at this time.  Discussed with patient if she begins to notice any signs of increasing cough, shortness of breath, fevers, chills, body aches, or increased fatigue to notify immediately or seek emergency care.

## 2021-03-25 NOTE — Assessment & Plan Note (Addendum)
Chronic.  Recent COVID-19 infection with hospitalization.  Continue current inhalers.  Restart Mucinex 1200 mg twice a day. No signs of infection present today.  Lungs do sound coarse and rhonchorous with wet cough however patient is still recovering from COVID-19 virus and this is likely causing symptoms.  Oxygenation status is stable with no signs of shortness of breath.  Will obtain chest x-ray today for further evaluation given ongoing cough.

## 2021-03-25 NOTE — Assessment & Plan Note (Signed)
Recent hospitalization for COVID-19 with new diagnosis of type 2 diabetes. Discussed medication changes with patient today and ensured accurate instructions were provided to [facility]. Discussed importance of following diabetic diet and how to avoid eating excessive carbs.  Patient also provided with handout on diabetes today. Patient provided with prescription for blood glucose monitor kit with instructions on monitoring and what to do in the event of high or low blood glucose measures. All instructions also faxed to [facility] for nursing staff to be aware. At this time patient appears stable.  We will obtain labs and additional chest x-ray today for evaluation. Recommend follow-up if symptoms worsen or fail to improve.

## 2021-03-25 NOTE — Assessment & Plan Note (Signed)
Recent changes to blood pressure medication during hospitalization.  Blood pressure today appears well controlled at 128/82. Instructions provided for [facility] to continue with daily blood pressure monitoring and notify if blood pressure readings are above 140/90 as medication changes will need to be made at that time. We will continue collaboration with cardiology.

## 2021-03-25 NOTE — Assessment & Plan Note (Signed)
Resolved.  Oxygen level stable on room air.

## 2021-03-26 LAB — IRON,TIBC AND FERRITIN PANEL
Ferritin: 22 ng/mL (ref 15–150)
Iron Saturation: 5 % — CL (ref 15–55)
Iron: 18 ug/dL — ABNORMAL LOW (ref 27–139)
Total Iron Binding Capacity: 360 ug/dL (ref 250–450)
UIBC: 342 ug/dL (ref 118–369)

## 2021-03-26 LAB — SPECIMEN STATUS REPORT

## 2021-03-31 ENCOUNTER — Ambulatory Visit (INDEPENDENT_AMBULATORY_CARE_PROVIDER_SITE_OTHER): Payer: Medicare HMO | Admitting: Pharmacist Clinician (PhC)/ Clinical Pharmacy Specialist

## 2021-03-31 ENCOUNTER — Encounter: Payer: Self-pay | Admitting: Pharmacist Clinician (PhC)/ Clinical Pharmacy Specialist

## 2021-03-31 ENCOUNTER — Other Ambulatory Visit: Payer: Self-pay

## 2021-03-31 DIAGNOSIS — I10 Essential (primary) hypertension: Secondary | ICD-10-CM | POA: Diagnosis not present

## 2021-03-31 NOTE — Progress Notes (Signed)
03/31/2021 [name] [name] 02-03-1941 [id number]   MB is an 67 YOF of Dr. Oswaldo Milian arriving to the clinic today for follow-up management of her hypertension. She was last seen by our clinic on 03/01/21 and her office BP was 158/80. The day before her appointment, her Amlodipine was increased from 21m to 131m Her readings from home were in the 150s-170s. At that visit, losartan was changed to Olmesartan. In the interim, she was hospitalized for COVID pneumonia from 2/1 to 03/13/21 and newly diagnosed with diabetes and DVT. At discharge, Amlodipine, Olmesartan, and HCTZ were discontinued. She was discharged on Carvedilol, Clonidine, and Hydralazine.   She had a follow-up visit with her PCP on 03/24/21 where her office BP was 128/82. She also complained of diarrhea with her metformin, switched to FaIranNo changes to her antihypertensives were made at this time but instructions were given to [facility] to notify PCP if BP > 140/90.   Today, her BP in the clinic is 118/60 and reports feeling back to normal since admission . She doesn't have a log of her BPs but tells usKoreat's similar to today's office BP throughout the day except in the mornings it's always high. She defines high as up to 190s. She reports the facility has not had to give her any clonidine since she was last admitted.    Patient denies confusion, chest pain, swelling, vision changes, shortness of breath, headaches, dizziness, palpitations or arrhythmias.   Labs (03/24/21): SCr 0.94, BUN 14, K 4.6, all labs WNL   Relevant Past Medical History:  CAD - Atorvastatin 1018mEliquis (DVT)  HTN - Carvedilol 6.25, Clonidine 0.2mg18mN, Hydralazine 25mg62m A1c 8.0 Farxiga 5mg, 72mformin 500mg  36mD, OSA, TIA, mitral valve regurgitation, bilateral carotid occlusion    Blood Pressure Goal: < 130/80   FH: cancer, intellectual disability in mother; cancer, HTN, in father; cancer in sister, asthma in paternal  grandparents; HTN in two sons; cancer in niece   SH: currently not drinking; former smoker x 20 years    Current CV agents: Carvedilol 6.25mg BI49mlonidine 0.2mg qhou75mRN for SBP > 190 (max 3 tablets/day), Hydralazine 25mg BID 19mst CV agents: Amlodipine, HCTZ, Losartan, Olmesartan, Furosemide      Patient-reported dietary habits:  o   Breakfast: bowl of cereal, oatmeal, or raisin toast o   Lunch: eats lunch provided by assisted living facility -- lots of chicken and fish o   Dinner: similar to lunch o   Snacks: snacks on popcorn; is an unsweet tea-a-holic   Patient-reported exercise habits: walking around the facility    Wt Readings from Last 3 Encounters:  03/31/21 255 lb (115.7 kg)  03/24/21 282 lb 11.2 oz (128.2 kg)  03/01/21 250 lb 6.4 oz (113.6 kg)   BP Readings from Last 3 Encounters:  03/31/21 118/60  03/24/21 128/82  03/13/21 (!) 159/74   Pulse Readings from Last 3 Encounters:  03/31/21 77  03/24/21 (!) 107  03/13/21 78    Current Outpatient Medications  Medication Sig Dispense Refill   albuterol (VENTOLIN HFA) 108 (90 Base) MCG/ACT inhaler Inhale 1-2 puffs into the lungs every 4 (four) hours as needed for wheezing or shortness of breath. 18 g 1   ALPRAZolam (XANAX) 0.25 MG tablet TAKE (1) TABLET BY MOUTH TWICE DAILY AT 11AM & 5PM FOR ANXIETY. (Patient taking differently: Take 0.25 mg by mouth 2 (two) times daily. 1100 and 1500 And an extra 0.25mg38m  oral twice daily as needed for anxiety) 60 tablet 0   AMBULATORY NON FORMULARY MEDICATION One four wheel rolling walker with seat and hand brakes. Brand/style covered by insurance. Medically necessary for ICD-10: R26.81, Z74.1 1 Units 0   amLODipine (NORVASC) 2.5 MG tablet Take 2.5 mg by mouth daily.     apixaban (ELIQUIS) 5 MG TABS tablet Take 1 tablet (5 mg total) by mouth 2 (two) times daily. 60 tablet 0   ARTHRITIS PAIN RELIEVING 0.075 % topical cream Apply 1 application topically 3 (three) times daily as needed  (neuropathic pain in feet).     atorvastatin (LIPITOR) 10 MG tablet Take 10 mg by mouth daily.     Bacillus Coagulans-Inulin (PROBIOTIC FORMULA) 1-250 BILLION-MG CAPS Take by mouth.     blood glucose meter kit and supplies KIT Meter, test strips, lancets, and alcohol swabs. Use for testing blood sugar every morning before meals and up to 3 additional times per day as needed. Brand based on patient and insurance coverage. 100 strips with 99 refills, 100 lancets with 99 refills, 200 alcohol swabs with 99 refills. Dx: E11.65 1 each 99   camphor-menthol (SARNA) lotion Apply 1 application topically as needed for itching. (Patient taking differently: Apply 1 application topically 2 (two) times daily as needed for itching.) 222 mL 0   carvedilol (COREG) 6.25 MG tablet Take 1 tablet (6.25 mg total) by mouth 2 (two) times daily with a meal. 60 tablet 0   cetirizine (ZYRTEC) 10 MG tablet Take 10 mg by mouth daily.     cloNIDine (CATAPRES) 0.2 MG tablet Take 0.2 mg by mouth See admin instructions. Take 0.90m by mouth once every hour as needed for bp greater than 190- Max 0.654m    dapagliflozin propanediol (FARXIGA) 5 MG TABS tablet Take 1 tablet (5 mg total) by mouth daily before breakfast. 30 tablet 6   diphenhydrAMINE (BENADRYL) 25 mg capsule Take 1 capsule (25 mg total) by mouth every 8 (eight) hours as needed for itching or allergies. 30 capsule 1   EASYMAX TEST test strip      estradiol (ESTRACE) 0.1 MG/GM vaginal cream Place 0.5g nightly for two weeks then twice a week after (Patient taking differently: Place 1 Applicatorful vaginally 2 (two) times a week. Mondays and Thursdays) 30 g 11   famotidine (PEPCID) 20 MG tablet Take 1 tablet (20 mg total) by mouth daily as needed for heartburn or indigestion. 90 tablet 3   guaiFENesin (MUCINEX) 600 MG 12 hr tablet Take 2 tablets (1,200 mg total) by mouth 2 (two) times daily as needed for to loosen phlegm or cough. Use for 7 days then as needed if cough is  improving. 30 tablet 1   hydrALAZINE (APRESOLINE) 25 MG tablet Take 25 mg by mouth 2 (two) times daily.     Iron, Ferrous Sulfate, 325 (65 Fe) MG TABS Take 325 mg by mouth daily. 30 tablet 0   Lactase 9000 units TABS Take one tablet with the first bite of meals containing milk or dairy products. (Patient taking differently: Take 9,000 Units by mouth See admin instructions. Take 9000units with the first bite of meals containing milk or dairy products.) 90 tablet 11   metFORMIN (GLUCOPHAGE) 500 MG tablet Take 1 tablet (500 mg total) by mouth 2 (two) times daily with a meal. 60 tablet 0   mirtazapine (REMERON) 7.5 MG tablet Take 1 tablet (7.5 mg total) by mouth at bedtime. For depression and anxiety symptoms 30 tablet 2  pantoprazole (PROTONIX) 40 MG tablet TAKE ONE TABLET BY MOUTH EVERY MORNING FOR GERD (Patient taking differently: Take 40 mg by mouth daily.) 30 tablet 0   PARoxetine (PAXIL) 40 MG tablet Take 40 mg by mouth daily.     TRELEGY ELLIPTA 100-62.5-25 MCG/ACT AEPB INHALE 1 PUFF INTO THE LUNGS DAILY. ** RINSE MOUTH AFTER USE** (Patient taking differently: Inhale 1 puff into the lungs daily.) 60 each 0   benzonatate (TESSALON) 200 MG capsule Take 1 capsule (200 mg total) by mouth 3 (three) times daily as needed for cough. (Patient not taking: Reported on 03/31/2021) 30 capsule 2   No current facility-administered medications for this visit.    Allergies  Allergen Reactions   Azithromycin Anaphylaxis and Hives   Bee Venom Anaphylaxis   Erythromycin Base Anaphylaxis   Metronidazole Anaphylaxis   Nitrofurantoin Macrocrystal Anaphylaxis   Penicillins Anaphylaxis and Hives    childhood   Sulfa Antibiotics Anaphylaxis and Hives    Childhood   Cymbalta [Duloxetine Hcl]    Ivp Dye [Iodinated Contrast Media]    Penicillin G Hives   Codeine Rash   Erythromycin Rash and Hives   Fosfomycin Rash   Levofloxacin Rash   Nitrofurantoin Hives   Percocet [Oxycodone-Acetaminophen] Rash    Past  Medical History:  Diagnosis Date   Allergy    Anxiety    Arthritis    Blood transfusion without reported diagnosis    Cancer ([place])    Melanoma on Great toe   Cataract    COPD (chronic obstructive pulmonary disease) ([place])    COVID-19 vaccine series started 07/01/2020   COVID-19 virus infection 03/09/2021   Depression    GERD (gastroesophageal reflux disease)    Hyperlipidemia    Hypertension    Need for shingles vaccine 07/01/2020   Osteoporosis    Prediabetes 05/22/2019   Sleep apnea    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 rate 17, height '5\' 5"'  (1.651 m), weight 255 lb (115.7 kg), SpO2 92 %.  Essential hypertension Blood pressure appears controlled on current regimen based solely on last two office BPs. Reports medication adherence but her facility [facility] does not correlate with ours. BP goal < 130/80.    Continue current regimen for now Encouraged to bring BP log and medications to follow-up visit with PCP  [employer]  I was with patient and student for entire visit and agree with above assessment and plan.  Tommy Medal PharmD CPP [facility] [address] [place], [place] [address] [phone]

## 2021-03-31 NOTE — Patient Instructions (Signed)
°  Check your blood pressure at home daily and keep record of the readings.  Take your BP meds as follows:  No changes to your medications today.   Please follow up with your PCP, and if you have any further concerns, please reach out to [place].   Bring all of your meds, your BP cuff and your record of home blood pressures to your next appointment.  Exercise as youre able, try to walk approximately 30 minutes per day.  Keep salt intake to a minimum, especially watch canned and prepared boxed foods.  Eat more fresh fruits and vegetables and fewer canned items.  Avoid eating in fast food restaurants.    HOW TO TAKE YOUR BLOOD PRESSURE: Rest 5 minutes before taking your blood pressure.  Dont smoke or drink caffeinated beverages for at least 30 minutes before. Take your blood pressure before (not after) you eat. Sit comfortably with your back supported and both feet on the floor (dont cross your legs). Elevate your arm to heart level on a table or a desk. Use the proper sized cuff. It should fit smoothly and snugly around your bare upper arm. There should be enough room to slip a fingertip under the cuff. The bottom edge of the cuff should be 1 inch above the crease of the elbow. Ideally, take 3 measurements at one sitting and record the average.

## 2021-03-31 NOTE — Assessment & Plan Note (Signed)
Blood pressure appears controlled on current regimen based solely on last two office BPs. Reports medication adherence but her facility [facility] does not correlate with ours. BP goal < 130/80.    Continue current regimen for now Encouraged to bring BP log and medications to follow-up visit with PCP

## 2021-04-04 ENCOUNTER — Ambulatory Visit (INDEPENDENT_AMBULATORY_CARE_PROVIDER_SITE_OTHER): Payer: Medicare HMO

## 2021-04-04 DIAGNOSIS — R55 Syncope and collapse: Secondary | ICD-10-CM

## 2021-04-04 LAB — CUP PACEART REMOTE DEVICE CHECK
Date Time Interrogation Session: 20230224234737
Implantable Pulse Generator Implant Date: 20191002

## 2021-04-04 NOTE — Telephone Encounter (Signed)
I spoke with patient and she was agreeable with a referral to GI. Sent referral.

## 2021-04-06 NOTE — Progress Notes (Signed)
Carelink Summary Report / Loop Recorder 

## 2021-04-08 ENCOUNTER — Other Ambulatory Visit: Payer: Self-pay

## 2021-04-08 ENCOUNTER — Ambulatory Visit (INDEPENDENT_AMBULATORY_CARE_PROVIDER_SITE_OTHER): Payer: Medicare HMO

## 2021-04-08 ENCOUNTER — Other Ambulatory Visit (HOSPITAL_COMMUNITY)
Admission: RE | Admit: 2021-04-08 | Discharge: 2021-04-08 | Disposition: A | Discharge status: Home / Self Care | Payer: Medicare HMO | Source: Ambulatory Visit | Attending: Obstetrics and Gynecology | Admitting: Obstetrics and Gynecology

## 2021-04-08 DIAGNOSIS — R350 Frequency of micturition: Secondary | ICD-10-CM | POA: Diagnosis present

## 2021-04-08 DIAGNOSIS — N3000 Acute cystitis without hematuria: Secondary | ICD-10-CM

## 2021-04-08 LAB — POCT URINALYSIS DIPSTICK
Appearance: ABNORMAL
Bilirubin, UA: NEGATIVE
Glucose, UA: POSITIVE — AB
Ketones, UA: NEGATIVE
Nitrite, UA: NEGATIVE
Protein, UA: POSITIVE — AB
Spec Grav, UA: 1.02 (ref 1.010–1.025)
Urobilinogen, UA: 0.2 E.U./dL
pH, UA: 6.5 (ref 5.0–8.0)

## 2021-04-08 NOTE — Progress Notes (Signed)
[name] [name] is a 81 y.o. female arrived today with UTI sx.  ?A urine specimen was collected and POCT Urine was done. ?POCT Urine was Positive and a urine sent for culture. A rx will be sent when the culture is back. Pt was made aware. ?

## 2021-04-08 NOTE — Patient Instructions (Signed)
You will be contacted when your culture is back. ?

## 2021-04-10 LAB — URINE CULTURE: Culture: >=100000 — AB

## 2021-04-11 MED ORDER — CIPROFLOXACIN HCL 500 MG PO TABS: 500.0000 mg | ORAL_TABLET | Freq: Two times a day (BID) | ORAL | 0 refills | Status: AC

## 2021-04-11 NOTE — Progress Notes (Signed)
Pt was called and contacted re: positive urine culture and antibiotics sent to the pharmacy. ?

## 2021-04-11 NOTE — Addendum Note (Signed)
Addended by: Jaquita Folds on: 04/11/2021 07:18 AM ? ? Modules accepted: Orders ? ?

## 2021-04-12 ENCOUNTER — Other Ambulatory Visit: Payer: Self-pay | Admitting: Nurse Practitioner

## 2021-04-18 ENCOUNTER — Encounter (HOSPITAL_BASED_OUTPATIENT_CLINIC_OR_DEPARTMENT_OTHER): Payer: Self-pay | Admitting: Nurse Practitioner

## 2021-04-20 ENCOUNTER — Encounter (HOSPITAL_BASED_OUTPATIENT_CLINIC_OR_DEPARTMENT_OTHER): Payer: Self-pay | Admitting: Nurse Practitioner

## 2021-04-20 ENCOUNTER — Ambulatory Visit (INDEPENDENT_AMBULATORY_CARE_PROVIDER_SITE_OTHER): Payer: Medicare HMO | Admitting: Nurse Practitioner

## 2021-04-20 ENCOUNTER — Other Ambulatory Visit: Payer: Self-pay

## 2021-04-20 DIAGNOSIS — B379 Candidiasis, unspecified: Secondary | ICD-10-CM | POA: Diagnosis not present

## 2021-04-20 DIAGNOSIS — I152 Hypertension secondary to endocrine disorders: Secondary | ICD-10-CM

## 2021-04-20 DIAGNOSIS — J0140 Acute pansinusitis, unspecified: Secondary | ICD-10-CM

## 2021-04-20 DIAGNOSIS — E1159 Type 2 diabetes mellitus with other circulatory complications: Secondary | ICD-10-CM | POA: Diagnosis not present

## 2021-04-20 DIAGNOSIS — I119 Hypertensive heart disease without heart failure: Secondary | ICD-10-CM

## 2021-04-20 DIAGNOSIS — I10 Essential (primary) hypertension: Secondary | ICD-10-CM | POA: Diagnosis not present

## 2021-04-20 HISTORY — DX: Acute pansinusitis, unspecified: J01.40

## 2021-04-20 HISTORY — DX: Candidiasis, unspecified: B37.9

## 2021-04-20 MED ORDER — AMLODIPINE-VALSARTAN-HCTZ 5-160-12.5 MG PO TABS: 0.5000 | ORAL_TABLET | Freq: Every day | ORAL | 1 refills | Status: DC

## 2021-04-20 MED ORDER — CEPHALEXIN 500 MG PO CAPS: 500.0000 mg | ORAL_CAPSULE | Freq: Two times a day (BID) | ORAL | 0 refills | Status: AC

## 2021-04-20 MED ORDER — FLUCONAZOLE 150 MG PO TABS: ORAL_TABLET | ORAL | 2 refills | Status: DC

## 2021-04-20 NOTE — Progress Notes (Signed)
Virtual Visit Encounter  telephone visit. ? ? ?I connected with  [name] [name] on 04/20/21 at 11:30 AM EDT by secure audio and/or video enabled telemedicine application. I verified that I am speaking with the correct person using two identifiers. ?  ?I introduced myself as a Designer, jewellery with the practice. The limitations of evaluation and management by telemedicine discussed with the patient and the availability of in person appointments. The patient expressed verbal understanding and consent to proceed. ? ?Participating parties in this visit include: Myself and patient ? ?The patient is: Patient Location: Home ?I am: Provider Location: Office/Clinic ?Subjective:   ? ?CC and HPI: [name] [name] is a 81 y.o. year old female presenting for follow up of BP. ? ?[name] [name] endorses: ?- At home BP readings are running high ?- Highest 197/84 reported this week. This AM 180/100 ?- BP readings are taken daily by facility staff ?- Using automatic wrist monitor ?- No HA, CP, palpitations, dizziness, weakness, confusion, vision changes ?- No changes that could exacerbate this ? ?Congestion, Cough, Sinus Pressure ?- started last week ?- has not been taking mucinex as she did not realize that she needed to ask for it ?- using throat lozenges for her sore throat and cough, which has been helpful ?- no fevers that she is aware of ?- pressure in face, rhinorrhea, congestion, cough, sore throat present ?- no ShOB, wheezing, CP ? ?Vaginal Itching ?- antibiotic for UTI two weeks ago ?- endorses vaginal itching and discharge present ?- has not tried anything for this ?- not sexually active ?- no dysuria ? ?Past medical history, Surgical history, Family history not pertinant except as noted below, Social history, Allergies, and medications have been entered into the medical record, reviewed, and corrections made.  ? ?Review of Systems:  ?All review of systems negative except what is listed in the HPI ? ?Objective:   ? ?Alert and  oriented x 4 ?Speaking in clear sentences with no shortness of breath. ?No distress. ? ?Impression and Recommendations:   ? ?Problem List Items Addressed This Visit   ? ? Hypertension associated with diabetes ([place])  ?  Elevated BP readings reported at home. No alarm sx present at this time ?Historically difficult to treat HTN.  ?Will stop amlodipine and change to amlodipine-valsartan-hctz combo at 2.5-80-6.25 dose initially in conjunction with carvedilol and hydralazine scheduled. Continue with clonidine 0.'2mg'$  PRN for BP > 190 every 1 hour for up to max 0.'6mg'$  dose in 24 h.  ?Will send written instructions to facility for new medication and instructions for upper arm blood pressure monitoring to ensure accuracy.  ?Patient will f/u in the next 2-3 days to let me know if her BP has not improved.  ?  ?  ? Relevant Medications  ? amLODIPine-Valsartan-HCTZ 5-160-12.5 MG TABS  ? Candida infection  ?  Symptoms consistent with vaginal candida infection related to recent anitbiotic use.  ?Will send fluconazole for 2 doses to be taken 72 hours apart.  ?Patient will f/u if sx worsen or fail to improve.  ?  ?  ? Relevant Medications  ? cephALEXin (KEFLEX) 500 MG capsule  ? fluconazole (DIFLUCAN) 150 MG tablet  ? Acute non-recurrent pansinusitis - Primary  ?  Symptoms consistent with bacterial origin. Will send abx therapy for treatment. Recommend continued mucinex BID PRN for congestion. Contact the office if symptoms worsen or new sx develop.  ?  ?  ? Relevant Medications  ? cephALEXin (KEFLEX) 500 MG capsule  ? fluconazole (DIFLUCAN)  150 MG tablet  ? Essential hypertension  ? Relevant Medications  ? amLODIPine-Valsartan-HCTZ 5-160-12.5 MG TABS  ? Hypertensive heart disease without congestive heart failure  ? Relevant Medications  ? amLODIPine-Valsartan-HCTZ 5-160-12.5 MG TABS  ? ? ?orders and follow up as documented in EMR ?I discussed the assessment and treatment plan with the patient. The patient was provided an opportunity  to ask questions and all were answered. The patient agreed with the plan and demonstrated an understanding of the instructions. ?  ?The patient was advised to call back or seek an in-person evaluation if the symptoms worsen or if the condition fails to improve as anticipated. ? ?Follow-Up: in a few days to report if BP is remaining elevated ? ?I provided 22 minutes of non-face-to-face interaction with this non face-to-face encounter including intake, same-day documentation, and chart review.  ? ?Orma Render, NP , DNP, AGNP-c ?[facility] ?Primary Care & Sports Medicine at [facility] ?[phone] ?[phone] (fax) ? ?

## 2021-04-20 NOTE — Assessment & Plan Note (Signed)
Symptoms consistent with vaginal candida infection related to recent anitbiotic use.  ?Will send fluconazole for 2 doses to be taken 72 hours apart.  ?Patient will f/u if sx worsen or fail to improve.  ?

## 2021-04-20 NOTE — Assessment & Plan Note (Signed)
Elevated BP readings reported at home. No alarm sx present at this time ?Historically difficult to treat HTN.  ?Will stop amlodipine and change to amlodipine-valsartan-hctz combo at 2.5-80-6.25 dose initially in conjunction with carvedilol and hydralazine scheduled. Continue with clonidine 0.'2mg'$  PRN for BP > 190 every 1 hour for up to max 0.'6mg'$  dose in 24 h.  ?Will send written instructions to facility for new medication and instructions for upper arm blood pressure monitoring to ensure accuracy.  ?Patient will f/u in the next 2-3 days to let me know if her BP has not improved.  ?

## 2021-04-20 NOTE — Assessment & Plan Note (Signed)
Symptoms consistent with bacterial origin. Will send abx therapy for treatment. Recommend continued mucinex BID PRN for congestion. Contact the office if symptoms worsen or new sx develop.  ?

## 2021-04-21 ENCOUNTER — Other Ambulatory Visit (HOSPITAL_BASED_OUTPATIENT_CLINIC_OR_DEPARTMENT_OTHER): Payer: Self-pay | Admitting: Nurse Practitioner

## 2021-04-25 ENCOUNTER — Other Ambulatory Visit (HOSPITAL_BASED_OUTPATIENT_CLINIC_OR_DEPARTMENT_OTHER): Payer: Self-pay | Admitting: Nurse Practitioner

## 2021-04-25 DIAGNOSIS — D649 Anemia, unspecified: Secondary | ICD-10-CM

## 2021-04-27 ENCOUNTER — Other Ambulatory Visit: Payer: Self-pay

## 2021-04-27 NOTE — Progress Notes (Signed)
error 

## 2021-04-28 ENCOUNTER — Other Ambulatory Visit (HOSPITAL_BASED_OUTPATIENT_CLINIC_OR_DEPARTMENT_OTHER): Payer: Self-pay | Admitting: Nurse Practitioner

## 2021-04-30 ENCOUNTER — Encounter (HOSPITAL_BASED_OUTPATIENT_CLINIC_OR_DEPARTMENT_OTHER): Payer: Self-pay | Admitting: Emergency Medicine

## 2021-04-30 ENCOUNTER — Emergency Department (HOSPITAL_BASED_OUTPATIENT_CLINIC_OR_DEPARTMENT_OTHER)
Admission: EM | Admit: 2021-04-30 | Discharge: 2021-04-30 | Disposition: A | Discharge status: Home / Self Care | Payer: Medicare HMO | Attending: Emergency Medicine | Admitting: Emergency Medicine

## 2021-04-30 ENCOUNTER — Other Ambulatory Visit: Payer: Self-pay

## 2021-04-30 DIAGNOSIS — I10 Essential (primary) hypertension: Secondary | ICD-10-CM | POA: Diagnosis present

## 2021-04-30 DIAGNOSIS — Z79899 Other long term (current) drug therapy: Secondary | ICD-10-CM | POA: Insufficient documentation

## 2021-04-30 LAB — BASIC METABOLIC PANEL
Anion gap: 7 (ref 5–15)
BUN: 13 mg/dL (ref 8–23)
CO2: 30 mmol/L (ref 22–32)
Calcium: 10 mg/dL (ref 8.9–10.3)
Chloride: 104 mmol/L (ref 98–111)
Creatinine, Ser: 0.86 mg/dL (ref 0.44–1.00)
GFR, Estimated: >60 mL/min (ref >60)
Glucose, Bld: 104 mg/dL — ABNORMAL HIGH (ref 70–99)
Potassium: 3.8 mmol/L (ref 3.5–5.1)
Sodium: 141 mmol/L (ref 135–145)

## 2021-04-30 MED ORDER — CLONIDINE HCL 0.1 MG PO TABS: 0.1000 mg | ORAL_TABLET | Freq: Three times a day (TID) | ORAL | 0 refills | Status: DC | PRN

## 2021-04-30 MED ORDER — CLONIDINE HCL 0.1 MG PO TABS
0.1000 mg | ORAL_TABLET | Freq: Once | ORAL | Status: AC
  Administered 2021-04-30: 0.1 mg via ORAL
  Filled 2021-04-30: qty 1

## 2021-04-30 NOTE — ED Notes (Signed)
ED Provider at bedside. 

## 2021-04-30 NOTE — ED Triage Notes (Signed)
Per ems and pt , she has been concerned about her bp running high these past few days with her systolic being in the 885O ,  ( EMS today 160 /88no deficits , pt  denies dizziness and any weakness, called her dr today and was sent here for further eval, denies gait changes , does have a h/a but hashx of migraines ?

## 2021-04-30 NOTE — ED Notes (Signed)
[facility] called for transport  AOM ?

## 2021-04-30 NOTE — Discharge Instructions (Signed)
Please have the facility gave you your as needed clonidine if your blood pressure is elevated.  Call your doctor Monday to schedule a follow-up visit for your blood pressure management ?

## 2021-04-30 NOTE — ED Provider Notes (Signed)
?[facility] ?Provider Note ? ? ?CSN: [id number] ?Arrival date & time: 04/30/21  1133 ? ?  ? ?History ? ?Chief Complaint  ?Patient presents with  ? Hypertension  ? ? ?[name] [name] is a 81 y.o. female. ? ?81 year old female presents with asymptomatic high blood pressure at home.  Patient lives in a facility where they check her blood pressure 3 times a day and she was found to have systolic of 242.  Patient denies any symptoms with this.  Has not had any recent chest pain, shortness of breath.  No abdominal discomfort.  No syncope or near syncope.  Patient is on 3 different medications for blood pressure.  She has as needed clonidine which she did not take today.  Otherwise feels okay ? ? ?  ? ?Home Medications ?Prior to Admission medications   ?Medication Sig Start Date End Date Taking? Authorizing Provider  ?albuterol (VENTOLIN HFA) 108 (90 Base) MCG/ACT inhaler Inhale 1-2 puffs into the lungs every 4 (four) hours as needed for wheezing or shortness of breath. 01/27/21   Martyn Ehrich, NP  ?ALPRAZolam (XANAX) 0.25 MG tablet TAKE (1) TABLET BY MOUTH TWICE DAILY AT 11AM & 5PM FOR ANXIETY. ?Patient taking differently: Take 0.25 mg by mouth 2 (two) times daily. 1100 and 1500 ?And an extra 0.64m oral twice daily as needed for anxiety 01/25/21   Early, SCoralee Pesa NP  ?AMBULATORY NON FORMULARY MEDICATION One four wheel rolling walker with seat and hand brakes. Brand/style covered by insurance. Medically necessary for ICD-10: R26.81, Z74.1 12/15/20   EOrma Render NP  ?amLODIPine-Valsartan-HCTZ 5-160-12.5 MG TABS Take 0.5 tablets by mouth daily. 04/20/21   EOrma Render NP  ?ARTHRITIS PAIN RELIEVING 0.075 % topical cream Apply 1 application topically 3 (three) times daily as needed (neuropathic pain in feet). 07/06/20   [provider]  ?atorvastatin (LIPITOR) 10 MG tablet Take 10 mg by mouth daily. 11/21/19   [provider]  ?Bacillus Coagulans-Inulin (PROBIOTIC FORMULA)  1-250 BILLION-MG CAPS Take by mouth. 03/24/21   [provider]  ?blood glucose meter kit and supplies KIT Meter, test strips, lancets, and alcohol swabs. Use for testing blood sugar every morning before meals and up to 3 additional times per day as needed. Brand based on patient and insurance coverage. 100 strips with 99 refills, 100 lancets with 99 refills, 200 alcohol swabs with 99 refills. Dx: E11.65 03/24/21   EOrma Render NP  ?camphor-menthol (Timoteo Ace lotion Apply 1 application topically as needed for itching. ?Patient taking differently: Apply 1 application topically 2 (two) times daily as needed for itching. 12/19/19   MBrunetta Jeans PA-C  ?carvedilol (COREG) 6.25 MG tablet TAKE (1) TABLET BY MOUTH TWICE DAILY WITH A MEAL. 04/22/21   EOrma Render NP  ?cetirizine (ZYRTEC) 10 MG tablet Take 10 mg by mouth daily.    [provider]  ?cloNIDine (CATAPRES) 0.2 MG tablet Take 0.2 mg by mouth See admin instructions. Take 0.264mby mouth once every hour as needed for bp greater than 190- Max 0.75m44m  [provider]  ?dapagliflozin propanediol (FARXIGA) 5 MG TABS tablet Take 1 tablet (5 mg total) by mouth daily before breakfast. 03/24/21   Early, SarCoralee PesaP  ?diphenhydrAMINE (BENADRYL) 25 mg capsule Take 1 capsule (25 mg total) by mouth every 8 (eight) hours as needed for itching or allergies. 02/04/21   EarOrma RenderP  ?[facility] test strip  03/24/21   [provider]  ?  ELIQUIS 5 MG TABS tablet TAKE (1) TABLET BY MOUTH TWICE DAILY. 04/25/21   Orma Render, NP  ?estradiol (ESTRACE) 0.1 MG/GM vaginal cream Place 0.5g nightly for two weeks then twice a week after ?Patient taking differently: Place 1 Applicatorful vaginally 2 (two) times a week. Mondays and Thursdays 12/21/20   Jaquita Folds, MD  ?famotidine (PEPCID) 20 MG tablet Take 1 tablet (20 mg total) by mouth daily as needed for heartburn or indigestion. 08/13/20   Orma Render, NP  ?FEROSUL 325 (65 Fe) MG tablet  TAKE (1) TABLET BY MOUTH ONCE DAILY. 04/25/21   Orma Render, NP  ?fluconazole (DIFLUCAN) 150 MG tablet Take one tablet by mouth today and second dose in 72 hours. May repeat if symptoms return. 04/20/21   Orma Render, NP  ?guaiFENesin (MUCINEX) 600 MG 12 hr tablet Take 2 tablets (1,200 mg total) by mouth 2 (two) times daily as needed for to loosen phlegm or cough. Use for 7 days then as needed if cough is improving. 03/24/21   Orma Render, NP  ?hydrALAZINE (APRESOLINE) 25 MG tablet Take 25 mg by mouth 2 (two) times daily. 12/12/19   [provider]  ?Lactase 9000 units TABS Take one tablet with the first bite of meals containing milk or dairy products. ?Patient taking differently: Take 9,000 Units by mouth See admin instructions. Take 9000units with the first bite of meals containing milk or dairy products. 11/11/20   Orma Render, NP  ?mirtazapine (REMERON) 7.5 MG tablet Take 1 tablet (7.5 mg total) by mouth at bedtime. For depression and anxiety symptoms 11/11/20   Early, Coralee Pesa, NP  ?pantoprazole (PROTONIX) 40 MG tablet TAKE ONE TABLET BY MOUTH EVERY MORNING FOR GERD ?Patient taking differently: Take 40 mg by mouth daily. 04/28/20   Orma Flaming, MD  ?PARoxetine (PAXIL) 40 MG tablet Take 40 mg by mouth daily. 10/08/19   [provider]  ?Donnal Debar 100-62.5-25 MCG/ACT AEPB INHALE 1 PUFF INTO THE LUNGS DAILY. ** RINSE MOUTH AFTER USE** ?Patient taking differently: Inhale 1 puff into the lungs daily. 02/21/21   Parrett, Fonnie Mu, NP  ?   ? ?Allergies    ?Azithromycin, Bee venom, Erythromycin base, Metronidazole, Nitrofurantoin macrocrystal, Penicillins, Sulfa antibiotics, Cymbalta [duloxetine hcl], Ivp dye [iodinated contrast media], Penicillin g, Codeine, Erythromycin, Fosfomycin, Levofloxacin, Nitrofurantoin, and Percocet [oxycodone-acetaminophen]   ? ?Review of Systems   ?Review of Systems  ?All other systems reviewed and are negative. ? ?Physical Exam ?Updated Vital Signs ?BP (!) 183/79 (BP  Location: Left Arm)   Pulse 77   Resp 19   Ht 1.651 m (_0 )   Wt 115.7 kg   SpO2 97%   BMI 42.45 kg/m?  ?Physical Exam ?Vitals and nursing note reviewed.  ?Constitutional:   ?   General: She is not in acute distress. ?   Appearance: Normal appearance. She is well-developed. She is not toxic-appearing.  ?HENT:  ?   Head: Normocephalic and atraumatic.  ?Eyes:  ?   General: Lids are normal.  ?   Conjunctiva/sclera: Conjunctivae normal.  ?   Pupils: Pupils are equal, round, and reactive to light.  ?Neck:  ?   Thyroid: No thyroid mass.  ?   Trachea: No tracheal deviation.  ?Cardiovascular:  ?   Rate and Rhythm: Normal rate and regular rhythm.  ?   Heart sounds: Normal heart sounds. No murmur heard. ?  No gallop.  ?Pulmonary:  ?   Effort: Pulmonary effort is  normal. No respiratory distress.  ?   Breath sounds: Normal breath sounds. No stridor. No decreased breath sounds, wheezing, rhonchi or rales.  ?Abdominal:  ?   General: There is no distension.  ?   Palpations: Abdomen is soft.  ?   Tenderness: There is no abdominal tenderness. There is no rebound.  ?Musculoskeletal:     ?   General: No tenderness. Normal range of motion.  ?   Cervical back: Normal range of motion and neck supple.  ?Skin: ?   General: Skin is warm and dry.  ?   Findings: No abrasion or rash.  ?Neurological:  ?   Mental Status: She is alert and oriented to person, place, and time. Mental status is at baseline.  ?   GCS: GCS eye subscore is 4. GCS verbal subscore is 5. GCS motor subscore is 6.  ?   Cranial Nerves: No cranial nerve deficit.  ?   Sensory: No sensory deficit.  ?   Motor: Motor function is intact.  ?Psychiatric:     ?   Attention and Perception: Attention normal.     ?   Speech: Speech normal.     ?   Behavior: Behavior normal.  ? ? ?ED Results / Procedures / Treatments   ?Labs ?(all labs ordered are listed, but only abnormal results are displayed) ?Labs Reviewed  ?BASIC METABOLIC PANEL  ? ? ?EKG ?EKG  Interpretation ? ?Date/Time:  Saturday April 30 2021 11:50:34 EDT ?Ventricular Rate:  81 ?PR Interval:  182 ?QRS Duration: 95 ?QT Interval:  372 ?QTC Calculation: 432 ?R Axis:   -21 ?Text Interpretation: Sinus rhythm Borderline left a

## 2021-05-02 ENCOUNTER — Telehealth (HOSPITAL_BASED_OUTPATIENT_CLINIC_OR_DEPARTMENT_OTHER): Payer: Self-pay | Admitting: Nurse Practitioner

## 2021-05-02 NOTE — Telephone Encounter (Signed)
Received a fax from after hours on 3/24 after the office was closed. Call stated they are needing PA for Rx Clonidine ACL. ?Please advise. ?

## 2021-05-02 NOTE — Telephone Encounter (Signed)
Pt also called on 3/25 regaridng BP being high looks like in pts chart she was advised to go to ED and did that same day. ?Please advise. ?

## 2021-05-02 NOTE — Telephone Encounter (Signed)
Patient has in office appt on 3/30 for eval ?

## 2021-05-05 ENCOUNTER — Ambulatory Visit (INDEPENDENT_AMBULATORY_CARE_PROVIDER_SITE_OTHER): Payer: Medicare HMO | Admitting: Nurse Practitioner

## 2021-05-05 ENCOUNTER — Encounter (HOSPITAL_BASED_OUTPATIENT_CLINIC_OR_DEPARTMENT_OTHER): Payer: Self-pay | Admitting: Nurse Practitioner

## 2021-05-05 ENCOUNTER — Ambulatory Visit (INDEPENDENT_AMBULATORY_CARE_PROVIDER_SITE_OTHER): Payer: Medicare HMO

## 2021-05-05 VITALS — BP 122/82 | HR 86 | Temp 98.7°F | Ht 65.0 in | Wt 257.0 lb

## 2021-05-05 DIAGNOSIS — E1169 Type 2 diabetes mellitus with other specified complication: Secondary | ICD-10-CM

## 2021-05-05 DIAGNOSIS — E1149 Type 2 diabetes mellitus with other diabetic neurological complication: Secondary | ICD-10-CM | POA: Diagnosis not present

## 2021-05-05 DIAGNOSIS — J449 Chronic obstructive pulmonary disease, unspecified: Secondary | ICD-10-CM | POA: Diagnosis not present

## 2021-05-05 DIAGNOSIS — D649 Anemia, unspecified: Secondary | ICD-10-CM

## 2021-05-05 DIAGNOSIS — E1159 Type 2 diabetes mellitus with other circulatory complications: Secondary | ICD-10-CM | POA: Diagnosis not present

## 2021-05-05 DIAGNOSIS — E785 Hyperlipidemia, unspecified: Secondary | ICD-10-CM

## 2021-05-05 DIAGNOSIS — I152 Hypertension secondary to endocrine disorders: Secondary | ICD-10-CM

## 2021-05-05 DIAGNOSIS — R55 Syncope and collapse: Secondary | ICD-10-CM

## 2021-05-05 DIAGNOSIS — I739 Peripheral vascular disease, unspecified: Secondary | ICD-10-CM

## 2021-05-05 DIAGNOSIS — E1165 Type 2 diabetes mellitus with hyperglycemia: Secondary | ICD-10-CM

## 2021-05-05 DIAGNOSIS — F321 Major depressive disorder, single episode, moderate: Secondary | ICD-10-CM

## 2021-05-05 DIAGNOSIS — R590 Localized enlarged lymph nodes: Secondary | ICD-10-CM

## 2021-05-05 LAB — CUP PACEART REMOTE DEVICE CHECK
Date Time Interrogation Session: 20230330004813
Implantable Pulse Generator Implant Date: 20191002

## 2021-05-05 MED ORDER — DAPAGLIFLOZIN PROPANEDIOL 10 MG PO TABS: 10.0000 mg | ORAL_TABLET | Freq: Every day | ORAL | 3 refills | Status: DC

## 2021-05-05 NOTE — Progress Notes (Signed)
? ?Established Patient Office Visit ? ?Subjective:  ?Patient ID: [name] [name], female    DOB: 11/18/1940  Age: 81 y.o. MRN: [id number] ? ?CC:  ?Chief Complaint  ?Patient presents with  ? Hypertension  ? ? ?HPI ?[name] [name] presents for follow-up for HTN. She is with her son today.  ?[name] [name] has concerns over recent elevated readings in her BP. She reports the nursing staff at the facility where she resides takes her BP every morning and afternoon. She tells me they use a wrist cuff and her BP is often very elevated. This weekend she reports her BP was over 810 systolic and she became very concerned. The nursing facility had run out of clonidine for emergency use and she went to the emergency room for evaluation and recommendations. She denies symptoms of HTN including no HA, palpitations, dizziness, CP, vision changes, weakness, or dizziness.  ? ?Her son tells me that she has not been using her CPAP routinely and she has been sleeping in her recliner most nights. He is concerned that this is contributing to her elevated BP. He also tells me that she is not very physically active and stays in her room most of the time. He is concerned sleep habits and sedentary lifestyle are contributing to her problems.  ? ? ?ROS ?Review of Systems ?All review of systems negative except what is listed in the HPI ? ?  ?Objective:  ?  ?Physical Exam ?Vitals and nursing note reviewed.  ?Constitutional:   ?   Appearance: Normal appearance.  ?HENT:  ?   Head: Normocephalic.  ?Eyes:  ?   Extraocular Movements: Extraocular movements intact.  ?   Conjunctiva/sclera: Conjunctivae normal.  ?   Pupils: Pupils are equal, round, and reactive to light.  ?Neck:  ?   Vascular: No carotid bruit.  ?Cardiovascular:  ?   Rate and Rhythm: Normal rate and regular rhythm.  ?   Pulses: Normal pulses.  ?   Heart sounds: Normal heart sounds. No murmur heard. ?Pulmonary:  ?   Effort: Pulmonary effort is normal.  ?   Breath sounds: Normal breath  sounds.  ?Musculoskeletal:  ?   Cervical back: Normal range of motion.  ?   Right lower leg: Edema present.  ?   Left lower leg: Edema present.  ?Skin: ?   General: Skin is warm and dry.  ?   Capillary Refill: Capillary refill takes less than 2 seconds.  ?Neurological:  ?   General: No focal deficit present.  ?   Mental Status: She is alert and oriented to person, place, and time.  ?Psychiatric:     ?   Mood and Affect: Mood normal.     ?   Behavior: Behavior normal.     ?   Thought Content: Thought content normal.     ?   Judgment: Judgment normal.  ? ? ?BP 122/82   Pulse 86   Temp 98.7 ?F (37.1 ?C)   Ht '5\' 5"'$  (1.651 m)   Wt 257 lb (116.6 kg)   SpO2 97%   BMI 42.77 kg/m?  ?Wt Readings from Last 3 Encounters:  ?05/05/21 257 lb (116.6 kg)  ?04/30/21 255 lb 1.2 oz (115.7 kg)  ?03/31/21 255 lb (115.7 kg)  ? ?  ?Assessment & Plan:  ? ?Problem List Items Addressed This Visit   ? ? Anemia  ?  Labs today for monitoring. No alarm symptoms present.  ?  ?  ? Relevant Orders  ? CBC with  Differential/Platelet  ? Iron, TIBC and Ferritin Panel  ? Chronic obstructive lung disease ([place])  ?  No alarm sx present today.  ?Will obtain labs.  ?Recommend CPAP use nightly for at least 5-6 hours for best results. Recommend use with naps for best measure.  ?  ?  ? Relevant Orders  ? CBC with Differential/Platelet  ? Iron, TIBC and Ferritin Panel  ? LP+LDL Direct  ? Hyperlipidemia associated with type 2 diabetes mellitus ([place])  ?  Chronic. Labs today. No changes to medication management at this time.  ?  ?  ? Relevant Medications  ? dapagliflozin propanediol (FARXIGA) 10 MG TABS tablet  ? Other Relevant Orders  ? CBC with Differential/Platelet  ? Iron, TIBC and Ferritin Panel  ? LP+LDL Direct  ? Depression, major, single episode, moderate ([place])  ?  Stable. Chronic. No changes to medication. Close monitoring for new or worsening symptoms.  ?  ?  ? Hypertension associated with diabetes ([place]) - Primary  ?  BP well controlled in the office  today.  ?Lengthy discussion with patient and her son about the importance of proper BP monitoring and avoiding use of a wrist cuff to prevent abnormally elevated BP readings.  ?Discussed medication management and the importance of avoiding over medicating to help prevent hypotension.  ?Discussed importance of sleep hygiene, CPAP use, water intake, decreased sodium intake, and regular activity to help manage BP.  ?Note provided and faxed to facility to ensure that BP orders are written to be taken in upper arm only with patient seated and resting.  ?Recommend continue current medication at this time, but continue to monitor BP and report the at home readings to me at follow-up visit.  ?Will obtain labs today.  ?Discussion on use of PRN medication when BP is significantly elevated vs use daily to help avoid possible hypotensive episodes. It does seem that when she is getting appropriate rest and using her CPAP her BP is well controlled therefore no changes to medications today.  ?  ?  ? Relevant Medications  ? dapagliflozin propanediol (FARXIGA) 10 MG TABS tablet  ? Other Relevant Orders  ? CBC with Differential/Platelet  ? Iron, TIBC and Ferritin Panel  ? LP+LDL Direct  ? Type 2 diabetes mellitus with hyperglycemia, without long-term current use of insulin ([place])  ?  Chronic. Labs today. Will add [place] today for improved BG control as her at home readings are showing to be elevated. Plan to f/u with A1c in 3 months.  ?  ?  ? Relevant Medications  ? dapagliflozin propanediol (FARXIGA) 10 MG TABS tablet  ? Lymphadenopathy, inguinal  ?  Chronic. Recommend elevation of feet nightly to help with blood flow and reduction of LE swelling.  ?  ?  ? PAD (peripheral artery disease) ([place])  ?  Mild LE edema present today. She is wearing compression stockings.  ?Stressed importance of using CPAP and sleeping the bed so that her feet can be elevated to assist with peripheral blood return. She expressed understanding.  ?  ?  ?  Morbid obesity ([place])  ? Relevant Medications  ? dapagliflozin propanediol (FARXIGA) 10 MG TABS tablet  ? Other Relevant Orders  ? CBC with Differential/Platelet  ? Iron, TIBC and Ferritin Panel  ? LP+LDL Direct  ? ? ?Meds ordered this encounter  ?Medications  ? dapagliflozin propanediol (FARXIGA) 10 MG TABS tablet  ?  Sig: Take 1 tablet (10 mg total) by mouth daily before breakfast.  ?  Dispense:  90 tablet  ?  Refill:  3  ? ? ?Follow-up: Return in about 3 months (around 08/05/2021) for diabetes and htn.  ? ? ?Orma Render, NP ?

## 2021-05-05 NOTE — Patient Instructions (Addendum)
It was a pleasure seeing you today. I hope your time spent with [place] was pleasant and helpful. Please let us know if there is anything we can do to improve the service you receive.  ? ?Today we discussed concerns with: ? ?Anemia, unspecified type ?Type 2 diabetes mellitus with neurological complications ([place]) ?Hypertension associated with diabetes ([place]) ?Chronic obstructive pulmonary disease, unspecified COPD type ([place]) ?Morbid obesity ([place]) ?Hyperlipidemia associated with type 2 diabetes mellitus ([place]) ?Type 2 diabetes mellitus with hyperglycemia, without long-term current use of insulin ([place]) ?Lymphadenopathy, inguinal ? ? ?The following orders have been placed for you today: ? ?Orders Placed This Encounter  ?Procedures  ? CBC with Differential/Platelet  ?  Order Specific Question:   Release to patient  ?  Answer:   Immediate  ? Iron, TIBC and Ferritin Panel  ? LP+LDL Direct  ?  Order Specific Question:   Release to patient  ?  Answer:   Immediate  ?  Order Specific Question:   Remote health to draw?  ?  Answer:   No  ? ? ? ?Important Office Information ?Lab Results ?If labs were ordered, please note that you will see results through [place] as soon as they come available from [facility].  ?It takes up to 5 business days for the results to be routed to me and for me to review them once all of the lab results have come through from [facility]. I will make recommendations based on your results and send these through [place] or someone from the office will call you to discuss. If your labs are abnormal, we may contact you to schedule a visit to discuss the results and make recommendations.  ?If you have not heard from [place] within 5 business days or you have waited longer than a week and your lab results have not come through on [place], please feel free to call the office or send a message through [place] to follow-up on these labs.  ? ?Referrals ?If referrals were placed today, the office where the referral was sent will  contact you either by phone or through [place] to set up scheduling. Please note that it can take up to a week for the referral office to contact you. If you do not hear from them in a week, please contact the referral office directly to inquire about scheduling.  ? ?Condition Treated ?If your condition worsens or you begin to have new symptoms, please schedule a follow-up appointment for further evaluation. If you are not sure if an appointment is needed, you may call the office to leave a message for the nurse and someone will contact you with recommendations.  ?If you have an urgent or life threatening emergency, please do not call the office, but seek emergency evaluation by calling 911 or going to the nearest emergency room for evaluation.  ? ?MyChart and Phone Calls ?Please do not use MyChart for urgent messages. It may take up to 3 business days for MyChart messages to be read by staff and if they are unable to handle the request, an additional 3 business days for them to be routed to me and for my response.  ?Messages sent to the provider through [place] do not come directly to the provider, please allow time for these messages to be routed and for me to respond.  ?We get a large volume of MyChart messages daily and these are responded to in the order received.  ? ?For urgent messages, please call the office at [phone]  and speak with the front office staff or leave a message on the line of my assistant for guidance.  ?We are seeing patients from the hours of 8:00 am through 5:00 pm and calls directly to the nurse may not be answered immediately due to seeing patients, but your call will be returned as soon as possible.  ?Phone  messages received after 4:00 PM Monday through Thursday may not be returned until the following business day. Phone messages received after 11:00 AM on Friday may not be returned until Monday.  ? ?After Hours ?We share on call hours with providers from other offices. If you have  an urgent need after hours that cannot wait until the next business day, please contact the on call provider by calling the office number. A nurse will speak with you and contact the provider if needed for recommendations.  ?If you have an urgent or life threatening emergency after hours, please do not call the on call provider, but seek emergency evaluation by calling 911 or going to the nearest emergency room for evaluation.  ? ?Paperwork ?All paperwork requires a minimum of 5 days to complete and return to you or the designated personnel. Please keep this in mind when bringing in forms or sending requests for paperwork completion to the office.  ?  ?

## 2021-05-09 ENCOUNTER — Ambulatory Visit: Payer: Medicare HMO | Admitting: Podiatry

## 2021-05-10 ENCOUNTER — Encounter (HOSPITAL_BASED_OUTPATIENT_CLINIC_OR_DEPARTMENT_OTHER): Payer: Self-pay | Admitting: Nurse Practitioner

## 2021-05-10 DIAGNOSIS — I739 Peripheral vascular disease, unspecified: Secondary | ICD-10-CM | POA: Insufficient documentation

## 2021-05-10 NOTE — Assessment & Plan Note (Signed)
BP well controlled in the office today.  ?Lengthy discussion with patient and her son about the importance of proper BP monitoring and avoiding use of a wrist cuff to prevent abnormally elevated BP readings.  ?Discussed medication management and the importance of avoiding over medicating to help prevent hypotension.  ?Discussed importance of sleep hygiene, CPAP use, water intake, decreased sodium intake, and regular activity to help manage BP.  ?Note provided and faxed to facility to ensure that BP orders are written to be taken in upper arm only with patient seated and resting.  ?Recommend continue current medication at this time, but continue to monitor BP and report the at home readings to me at follow-up visit.  ?Will obtain labs today.  ?Discussion on use of PRN medication when BP is significantly elevated vs use daily to help avoid possible hypotensive episodes. It does seem that when she is getting appropriate rest and using her CPAP her BP is well controlled therefore no changes to medications today.  ?

## 2021-05-10 NOTE — Assessment & Plan Note (Signed)
Labs today for monitoring. No alarm symptoms present.  ?

## 2021-05-10 NOTE — Addendum Note (Signed)
Addended by: Walsie Smeltz, Clarise Cruz E on: 05/10/2021 09:08 PM ? ? Modules accepted: Level of Service ? ?

## 2021-05-10 NOTE — Assessment & Plan Note (Signed)
Chronic. Labs today. Will add [place] today for improved BG control as her at home readings are showing to be elevated. Plan to f/u with A1c in 3 months.  ?

## 2021-05-10 NOTE — Assessment & Plan Note (Signed)
Chronic. Recommend elevation of feet nightly to help with blood flow and reduction of LE swelling.  ?

## 2021-05-10 NOTE — Assessment & Plan Note (Signed)
Mild LE edema present today. She is wearing compression stockings.  ?Stressed importance of using CPAP and sleeping the bed so that her feet can be elevated to assist with peripheral blood return. She expressed understanding.  ?

## 2021-05-10 NOTE — Assessment & Plan Note (Signed)
Stable. Chronic. No changes to medication. Close monitoring for new or worsening symptoms.  ?

## 2021-05-10 NOTE — Assessment & Plan Note (Signed)
Chronic. Labs today. No changes to medication management at this time.  ?

## 2021-05-10 NOTE — Assessment & Plan Note (Signed)
No alarm sx present today.  ?Will obtain labs.  ?Recommend CPAP use nightly for at least 5-6 hours for best results. Recommend use with naps for best measure.  ?

## 2021-05-17 NOTE — Progress Notes (Signed)
Carelink Summary Report / Loop Recorder 

## 2021-05-19 ENCOUNTER — Ambulatory Visit (INDEPENDENT_AMBULATORY_CARE_PROVIDER_SITE_OTHER): Payer: Medicare HMO | Admitting: Podiatry

## 2021-05-19 ENCOUNTER — Encounter: Payer: Self-pay | Admitting: Podiatry

## 2021-05-19 DIAGNOSIS — G629 Polyneuropathy, unspecified: Secondary | ICD-10-CM

## 2021-05-19 DIAGNOSIS — M79674 Pain in right toe(s): Secondary | ICD-10-CM

## 2021-05-19 DIAGNOSIS — S98111A Complete traumatic amputation of right great toe, initial encounter: Secondary | ICD-10-CM

## 2021-05-20 ENCOUNTER — Encounter (HOSPITAL_BASED_OUTPATIENT_CLINIC_OR_DEPARTMENT_OTHER): Payer: Medicare HMO

## 2021-05-20 NOTE — Progress Notes (Signed)
Subjective:  ? ?Patient ID: [name] [name], female   DOB: 81 y.o.   MRN: [id number]  ? 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 ? ?ROS ? ? ?   ?Objective:  ?Physical Exam  ?Neurovascular status diminished with palpable pulses but weak and diminished sharp dull vibratory.  There is a loss of the great toe right and there is medial shift of the second and third digits no active lesions formation currently no breakdown of tissue ? ?   ?Assessment:  ?High risk patient who currently appears to be stable who would probably not benefit from toe filler ? ?   ?Plan:  ?H&P diabetic care discussed with patient along with daily inspections and do not see where toe filler will be of benefit to her as I think it would push against the second toe it may cause abrasion.  Patient will be seen back for [place] to recheck encouraged to call questions concerns ?   ? ? ?

## 2021-05-31 ENCOUNTER — Encounter (HOSPITAL_BASED_OUTPATIENT_CLINIC_OR_DEPARTMENT_OTHER): Payer: Self-pay | Admitting: Nurse Practitioner

## 2021-06-02 ENCOUNTER — Telehealth (HOSPITAL_BASED_OUTPATIENT_CLINIC_OR_DEPARTMENT_OTHER): Payer: Self-pay

## 2021-06-02 ENCOUNTER — Telehealth (HOSPITAL_BASED_OUTPATIENT_CLINIC_OR_DEPARTMENT_OTHER): Payer: Self-pay | Admitting: Nurse Practitioner

## 2021-06-02 NOTE — Telephone Encounter (Signed)
CMA contacted patient with next steps ?

## 2021-06-02 NOTE — Telephone Encounter (Signed)
Per SB instructions I faxed order over to [facility] for patient to be given cloninde 0.'1mg'$  by mouth 3 x times daily for ( systolic blood pressure above 150. This was faxed to [phone] ?

## 2021-06-02 NOTE — Telephone Encounter (Signed)
Pt son called regarding BP issues looks like there was a MyChart message regarding this issue pt is needing triage to assess further by clinical ? ?Please advise. ?

## 2021-06-09 ENCOUNTER — Other Ambulatory Visit (HOSPITAL_BASED_OUTPATIENT_CLINIC_OR_DEPARTMENT_OTHER): Payer: Self-pay | Admitting: Nurse Practitioner

## 2021-06-09 DIAGNOSIS — D649 Anemia, unspecified: Secondary | ICD-10-CM

## 2021-06-11 LAB — CUP PACEART REMOTE DEVICE CHECK
Date Time Interrogation Session: 20230505232422
Implantable Pulse Generator Implant Date: 20191002

## 2021-06-13 ENCOUNTER — Ambulatory Visit (INDEPENDENT_AMBULATORY_CARE_PROVIDER_SITE_OTHER): Payer: Medicare HMO

## 2021-06-13 DIAGNOSIS — R55 Syncope and collapse: Secondary | ICD-10-CM | POA: Diagnosis not present

## 2021-06-27 ENCOUNTER — Telehealth (HOSPITAL_BASED_OUTPATIENT_CLINIC_OR_DEPARTMENT_OTHER): Payer: Self-pay | Admitting: Nurse Practitioner

## 2021-06-27 NOTE — Telephone Encounter (Signed)
Received call from [facility] representative by the name of [place]. She called inquiring about some referrals that were sent to their office for psychiatry. All 3 of them were denied or closed for different reasons. Representative then asked if another referral could be placed and directly routed to Dr. Conception Chancy. Please advise.

## 2021-06-28 NOTE — Telephone Encounter (Signed)
Will put in referral if you are okay with that. Please just let me know.

## 2021-06-28 NOTE — Progress Notes (Signed)
Carelink Summary Report / Loop Recorder 

## 2021-07-05 ENCOUNTER — Encounter (HOSPITAL_BASED_OUTPATIENT_CLINIC_OR_DEPARTMENT_OTHER): Payer: Self-pay | Admitting: Nurse Practitioner

## 2021-07-05 ENCOUNTER — Ambulatory Visit (INDEPENDENT_AMBULATORY_CARE_PROVIDER_SITE_OTHER): Payer: Medicare HMO | Admitting: Nurse Practitioner

## 2021-07-05 VITALS — BP 146/70 | HR 86 | Ht 64.0 in | Wt 256.0 lb

## 2021-07-05 DIAGNOSIS — R2681 Unsteadiness on feet: Secondary | ICD-10-CM

## 2021-07-05 DIAGNOSIS — E1159 Type 2 diabetes mellitus with other circulatory complications: Secondary | ICD-10-CM | POA: Diagnosis not present

## 2021-07-05 DIAGNOSIS — Z741 Need for assistance with personal care: Secondary | ICD-10-CM

## 2021-07-05 DIAGNOSIS — E1165 Type 2 diabetes mellitus with hyperglycemia: Secondary | ICD-10-CM

## 2021-07-05 DIAGNOSIS — I152 Hypertension secondary to endocrine disorders: Secondary | ICD-10-CM

## 2021-07-05 DIAGNOSIS — F321 Major depressive disorder, single episode, moderate: Secondary | ICD-10-CM

## 2021-07-05 DIAGNOSIS — I2510 Atherosclerotic heart disease of native coronary artery without angina pectoris: Secondary | ICD-10-CM

## 2021-07-05 DIAGNOSIS — E785 Hyperlipidemia, unspecified: Secondary | ICD-10-CM

## 2021-07-05 DIAGNOSIS — E1169 Type 2 diabetes mellitus with other specified complication: Secondary | ICD-10-CM

## 2021-07-05 DIAGNOSIS — F4323 Adjustment disorder with mixed anxiety and depressed mood: Secondary | ICD-10-CM

## 2021-07-05 MED ORDER — AMLODIPINE-VALSARTAN-HCTZ 5-160-12.5 MG PO TABS: 1.0000 | ORAL_TABLET | Freq: Every day | ORAL | 1 refills | Status: DC

## 2021-07-05 NOTE — Assessment & Plan Note (Signed)
Chronic. Not well controlled at this time. Review of medication with patient today. Will plan to increase amlodipine-valsartan-hctz combo to 1 full tab daily. Will plan to follow-up with phone call in 1-2 weeks to see how her BP is looking with the increased dose. May need to continue to increase based on response.  Labs today to monitor kidney function given edema. No signs of fluid overload.  No alarm sx present.  Will monitor.

## 2021-07-05 NOTE — Assessment & Plan Note (Signed)
Chronic. Currently utilizing rollator walker for ambulation. I do feel that increased activity would be helpful for her mental and physical health. She is in need of therapeutic assistance to help develop a plan and boundaries to help improve mobility safely. Recommend PT evaluation for this. She would like this. Will send referral today.

## 2021-07-05 NOTE — Patient Instructions (Addendum)
It was a pleasure seeing you today. I hope your time spent with [place] was pleasant and helpful. Please let us know if there is anything we can do to improve the service you receive.   Today we discussed concerns with:  I will send a referral to counseling services for you to see if we can find someone who is taking new patients and can see you virtually.   I will call you in 1 week to see how your blood pressures are doing and if we need to make changes we can do that then.   I have increased the amlodipine-valsartan-hctz to 1 tab a day  Keep walking!! I am proud of you.  I have sent the referral to PT services     Important Office Information Lab Results If labs were ordered, please note that you will see results through [place] as soon as they come available from [place].  It takes up to 5 business days for the results to be routed to me and for me to review them once all of the lab results have come through from [facility]. I will make recommendations based on your results and send these through [place] or someone from the office will call you to discuss. If your labs are abnormal, we may contact you to schedule a visit to discuss the results and make recommendations.  If you have not heard from [place] within 5 business days or you have waited longer than a week and your lab results have not come through on [place], please feel free to call the office or send a message through [place] to follow-up on these labs.   Referrals If referrals were placed today, the office where the referral was sent will contact you either by phone or through [place] to set up scheduling. Please note that it can take up to a week for the referral office to contact you. If you do not hear from them in a week, please contact the referral office directly to inquire about scheduling.   Condition Treated If your condition worsens or you begin to have new symptoms, please schedule a follow-up appointment for further  evaluation. If you are not sure if an appointment is needed, you may call the office to leave a message for the nurse and someone will contact you with recommendations.  If you have an urgent or life threatening emergency, please do not call the office, but seek emergency evaluation by calling 911 or going to the nearest emergency room for evaluation.   MyChart and Phone Calls Please do not use MyChart for urgent messages. It may take up to 3 business days for MyChart messages to be read by staff and if they are unable to handle the request, an additional 3 business days for them to be routed to me and for my response.  Messages sent to the provider through [place] do not come directly to the provider, please allow time for these messages to be routed and for me to respond.  We get a large volume of MyChart messages daily and these are responded to in the order received.   For urgent messages, please call the office at [phone] and speak with the front office staff or leave a message on the line of my assistant for guidance.  We are seeing patients from the hours of 8:00 am through 5:00 pm and calls directly to the nurse may not be answered immediately due to seeing patients, but your call will be returned  as soon as possible.  Phone  messages received after 4:00 PM Monday through Thursday may not be returned until the following business day. Phone messages received after 11:00 AM on Friday may not be returned until Monday.   After Hours We share on call hours with providers from other offices. If you have an urgent need after hours that cannot wait until the next business day, please contact the on call provider by calling the office number. A nurse will speak with you and contact the provider if needed for recommendations.  If you have an urgent or life threatening emergency after hours, please do not call the on call provider, but seek emergency evaluation by calling 911 or going to the nearest  emergency room for evaluation.   Paperwork All paperwork requires a minimum of 5 days to complete and return to you or the designated personnel. Please keep this in mind when bringing in forms or sending requests for paperwork completion to the office.

## 2021-07-05 NOTE — Assessment & Plan Note (Signed)
Chronic. Following with psychiatry at this time. No changes in medications today.  Recommend continue with psychiatry for medication management given the complexities of dosing and HR medications.  Will send referral today for counseling services. Patient will follow-up if she does not hear from counseling in the next 10 days.

## 2021-07-05 NOTE — Progress Notes (Signed)
[name] Keeler, DNP, AGNP-c [address] [place], [place] [address] [phone] Office [phone] Fax  ESTABLISHED PATIENT- Chronic Health and/or Follow-Up Visit  Blood pressure (!) 146/70, pulse 86, height '5\' 4"'$  (1.626 m), weight 256 lb (116.1 kg), SpO2 98 %.  Follow-up (Patient wants to discuss high blood pressure and anxiety. Son is with her today./It is running 170/78 and higher. )   HPI  [name]  is a 81 y.o. year old female presenting today for evaluation and management of the following: HTN BP running 200's in the morning prior to breakfast 150's-170's on a regular basis BP will come down a bit during the day but never less than 145.  No HA, CP, palpitations, ShOB, signs of fluid volume overload LE edema is present consistently Anxiety Working with psychiatry to get on a medication regimen that is helpful BP is causing anxiety to worsen Would like to talk to someone to help with her anxiety  Counseling services beneficial in the past Would prefer to have virtual visits so she doesn't have to have transportation.  Mobility Has been working to improve mobility by walking at least 10 minutes a day Would like to have formal PT to help her improve her independent function to walk more  ROS All ROS negative with exception of what is listed in HPI  PHYSICAL EXAM Physical Exam Vitals and nursing note reviewed.  Constitutional:      General: She is not in acute distress.    Appearance: Normal appearance.  HENT:     Head: Normocephalic.  Eyes:     Extraocular Movements: Extraocular movements intact.     Conjunctiva/sclera: Conjunctivae normal.     Pupils: Pupils are equal, round, and reactive to light.  Neck:     Vascular: No carotid bruit.  Cardiovascular:     Rate and Rhythm: Normal rate and regular rhythm.     Pulses: Normal pulses.     Heart sounds: Normal heart sounds. No murmur  heard. Pulmonary:     Effort: Pulmonary effort is normal.     Breath sounds: Normal breath sounds. No wheezing.  Chest:     Chest wall: No tenderness.  Abdominal:     General: Bowel sounds are normal. There is no distension.     Palpations: Abdomen is soft.     Tenderness: There is no abdominal tenderness. There is no guarding.  Musculoskeletal:        General: Normal range of motion.     Cervical back: Normal range of motion and neck supple.     Right lower leg: Edema present.     Left lower leg: Edema present.  Lymphadenopathy:     Cervical: No cervical adenopathy.  Skin:    General: Skin is warm and dry.     Capillary Refill: Capillary refill takes less than 2 seconds.  Neurological:     General: No focal deficit present.     Mental Status: She is alert and oriented to person, place, and time. Mental status is at baseline.     Sensory: No sensory deficit.     Motor: Weakness present.     Gait: Gait abnormal.  Psychiatric:        Mood and Affect: Mood normal.        Behavior: Behavior normal.        Thought Content: Thought content normal.        Judgment: Judgment normal.    ASSESSMENT &  PLAN Problem List Items Addressed This Visit     Adjustment disorder with mixed anxiety and depressed mood    Chronic. Following with psychiatry at this time. No changes in medications today.  Recommend continue with psychiatry for medication management given the complexities of dosing and HR medications.  Will send referral today for counseling services. Patient will follow-up if she does not hear from counseling in the next 10 days.        Relevant Orders   Ambulatory referral to Psychology   Hypertension associated with diabetes ([place]) - Primary    Chronic. Not well controlled at this time. Review of medication with patient today. Will plan to increase amlodipine-valsartan-hctz combo to 1 full tab daily. Will plan to follow-up with phone call in 1-2 weeks to see how her BP is  looking with the increased dose. May need to continue to increase based on response.  Labs today to monitor kidney function given edema. No signs of fluid overload.  No alarm sx present.  Will monitor.        Relevant Medications   amLODIPine-Valsartan-HCTZ 5-160-12.5 MG TABS   Other Relevant Orders   Ambulatory referral to Physical Therapy   CBC with Differential/Platelet   Comprehensive metabolic panel   Lipid panel   Hemoglobin A1c   Gait instability    Chronic. Currently utilizing rollator walker for ambulation. I do feel that increased activity would be helpful for her mental and physical health. She is in need of therapeutic assistance to help develop a plan and boundaries to help improve mobility safely. Recommend PT evaluation for this. She would like this. Will send referral today.        Relevant Orders   Ambulatory referral to Physical Therapy   Hyperlipidemia associated with type 2 diabetes mellitus ([place])   Relevant Medications   amLODIPine-Valsartan-HCTZ 5-160-12.5 MG TABS   Other Relevant Orders   Ambulatory referral to Physical Therapy   CBC with Differential/Platelet   Comprehensive metabolic panel   Lipid panel   Hemoglobin A1c   Depression, major, single episode, moderate ([place])   Relevant Orders   Ambulatory referral to Psychology   Type 2 diabetes mellitus with hyperglycemia, without long-term current use of insulin (HCC)   Relevant Medications   amLODIPine-Valsartan-HCTZ 5-160-12.5 MG TABS   Other Relevant Orders   Ambulatory referral to Physical Therapy   CBC with Differential/Platelet   Comprehensive metabolic panel   Lipid panel   Hemoglobin A1c   Coronary artery disease involving native heart without angina pectoris   Relevant Medications   amLODIPine-Valsartan-HCTZ 5-160-12.5 MG TABS   Other Relevant Orders   Ambulatory referral to Physical Therapy   CBC with Differential/Platelet   Comprehensive metabolic panel   Lipid panel   Hemoglobin  A1c   Other Visit Diagnoses     Dependent for walking       Relevant Orders   Ambulatory referral to Physical Therapy        FOLLOW-UP Return for 1 week phone call end of day (let her know about 5pm) for BP.   [name] Keeler, DNP, AGNP-c 07/05/2021  9:36 AM

## 2021-07-06 LAB — LIPID PANEL
Chol/HDL Ratio: 3.1 ratio (ref 0.0–4.4)
Cholesterol, Total: 122 mg/dL (ref 100–199)
HDL: 39 mg/dL — ABNORMAL LOW (ref >39)
LDL Chol Calc (NIH): 50 mg/dL (ref 0–99)
Triglycerides: 207 mg/dL — ABNORMAL HIGH (ref 0–149)
VLDL Cholesterol Cal: 33 mg/dL (ref 5–40)

## 2021-07-06 LAB — HEMOGLOBIN A1C
Est. average glucose Bld gHb Est-mCnc: 148 mg/dL
Hgb A1c MFr Bld: 6.8 % — ABNORMAL HIGH (ref 4.8–5.6)

## 2021-07-06 LAB — CBC WITH DIFFERENTIAL/PLATELET
Basophils Absolute: 0 10*3/uL (ref 0.0–0.2)
Basos: 1 %
EOS (ABSOLUTE): 0.2 10*3/uL (ref 0.0–0.4)
Eos: 2 %
Hematocrit: 41 % (ref 34.0–46.6)
Hemoglobin: 12.8 g/dL (ref 11.1–15.9)
Immature Grans (Abs): 0 10*3/uL (ref 0.0–0.1)
Immature Granulocytes: 0 %
Lymphocytes Absolute: 1.3 10*3/uL (ref 0.7–3.1)
Lymphs: 15 %
MCH: 25.5 pg — ABNORMAL LOW (ref 26.6–33.0)
MCHC: 31.2 g/dL — ABNORMAL LOW (ref 31.5–35.7)
MCV: 82 fL (ref 79–97)
Monocytes Absolute: 0.6 10*3/uL (ref 0.1–0.9)
Monocytes: 7 %
Neutrophils Absolute: 6.5 10*3/uL (ref 1.4–7.0)
Neutrophils: 75 %
Platelets: 275 10*3/uL (ref 150–450)
RBC: 5.01 x10E6/uL (ref 3.77–5.28)
RDW: 16.3 % — ABNORMAL HIGH (ref 11.7–15.4)
WBC: 8.7 10*3/uL (ref 3.4–10.8)

## 2021-07-06 LAB — COMPREHENSIVE METABOLIC PANEL
ALT: 16 IU/L (ref 0–32)
AST: 22 IU/L (ref 0–40)
Albumin/Globulin Ratio: 1.4 (ref 1.2–2.2)
Albumin: 4.1 g/dL (ref 3.7–4.7)
Alkaline Phosphatase: 118 IU/L (ref 44–121)
BUN/Creatinine Ratio: 17 (ref 12–28)
BUN: 16 mg/dL (ref 8–27)
Bilirubin Total: 0.3 mg/dL (ref 0.0–1.2)
CO2: 23 mmol/L (ref 20–29)
Calcium: 9.5 mg/dL (ref 8.7–10.3)
Chloride: 102 mmol/L (ref 96–106)
Creatinine, Ser: 0.92 mg/dL (ref 0.57–1.00)
Globulin, Total: 2.9 g/dL (ref 1.5–4.5)
Glucose: 139 mg/dL — ABNORMAL HIGH (ref 70–99)
Potassium: 4.2 mmol/L (ref 3.5–5.2)
Sodium: 141 mmol/L (ref 134–144)
Total Protein: 7 g/dL (ref 6.0–8.5)
eGFR: 63 mL/min/{1.73_m2} (ref >59)

## 2021-07-08 ENCOUNTER — Ambulatory Visit: Payer: Medicare HMO | Admitting: Internal Medicine

## 2021-07-11 ENCOUNTER — Encounter (HOSPITAL_BASED_OUTPATIENT_CLINIC_OR_DEPARTMENT_OTHER): Payer: Self-pay | Admitting: Nurse Practitioner

## 2021-07-14 ENCOUNTER — Encounter (HOSPITAL_BASED_OUTPATIENT_CLINIC_OR_DEPARTMENT_OTHER): Payer: Self-pay | Admitting: Nurse Practitioner

## 2021-07-14 ENCOUNTER — Ambulatory Visit (INDEPENDENT_AMBULATORY_CARE_PROVIDER_SITE_OTHER): Payer: Medicare HMO | Admitting: Nurse Practitioner

## 2021-07-14 ENCOUNTER — Other Ambulatory Visit (HOSPITAL_BASED_OUTPATIENT_CLINIC_OR_DEPARTMENT_OTHER): Payer: Self-pay

## 2021-07-14 DIAGNOSIS — I152 Hypertension secondary to endocrine disorders: Secondary | ICD-10-CM

## 2021-07-14 DIAGNOSIS — E1159 Type 2 diabetes mellitus with other circulatory complications: Secondary | ICD-10-CM

## 2021-07-14 DIAGNOSIS — F4001 Agoraphobia with panic disorder: Secondary | ICD-10-CM | POA: Diagnosis not present

## 2021-07-14 DIAGNOSIS — F321 Major depressive disorder, single episode, moderate: Secondary | ICD-10-CM | POA: Diagnosis not present

## 2021-07-14 DIAGNOSIS — F4323 Adjustment disorder with mixed anxiety and depressed mood: Secondary | ICD-10-CM | POA: Diagnosis not present

## 2021-07-14 DIAGNOSIS — E1169 Type 2 diabetes mellitus with other specified complication: Secondary | ICD-10-CM

## 2021-07-14 DIAGNOSIS — I2510 Atherosclerotic heart disease of native coronary artery without angina pectoris: Secondary | ICD-10-CM

## 2021-07-14 DIAGNOSIS — E1165 Type 2 diabetes mellitus with hyperglycemia: Secondary | ICD-10-CM | POA: Diagnosis not present

## 2021-07-14 DIAGNOSIS — E785 Hyperlipidemia, unspecified: Secondary | ICD-10-CM

## 2021-07-14 MED ORDER — AMLODIPINE-VALSARTAN-HCTZ 5-160-12.5 MG PO TABS: 2.0000 | ORAL_TABLET | Freq: Every day | ORAL | 1 refills | Status: DC

## 2021-07-14 NOTE — Assessment & Plan Note (Signed)
Improved BP readings with new medication, but still not at goal. Discussed with patient that we want to ensure we are going slow as to not drop her pressures too quickly and cause side effects. I am pleased with the improvement we have seen.  We will plan to increase amlodipine/valsartan/hctz to 2 tabs a day (10-320-'25mg'$  total dose) and monitor blood pressures. She will report to me in 1 week if her pressures are controlled.  If not controlled- we will plan to discontinue the PRN clonidine and add scheduled doxazosin '2mg'$  daily with option to titrate up.  Repeat labs in the next few days for CMP. Patient coming tomorrow for PT and can do this at that time.

## 2021-07-14 NOTE — Patient Instructions (Signed)
[name] [name],   I am increasing your amlodipine '5mg'$ / valsartan '160mg'$ / hctz 12.'5mg'$  to 2 tabs a day. This will increase the total dose to 10/320/25 mg  Please let me know if your blood pressure continues to remain elevated in one week.   When you come in for physical therapy, I would like to check your kidney function. If you can come up to the 3rd floor, we can do that at any time tomorrow while you are here.

## 2021-07-14 NOTE — Progress Notes (Signed)
Virtual Visit Encounter telephone visit.   I connected with  [name] [name] on 07/14/21 at  2:10 PM EDT by secure audio telemedicine application. I verified that I am speaking with the correct person using two identifiers.   I introduced myself as a Designer, jewellery with the practice. The limitations of evaluation and management by telemedicine discussed with the patient and the availability of in person appointments. The patient expressed verbal understanding and consent to proceed.  Participating parties in this visit include: Myself and patient  The patient is: Patient Location: Home I am: Provider Location: Office/Clinic Subjective:    CC and HPI: [name] [name] is a 81 y.o. year old female presenting for follow up of HTN. Patient reports the following: Started Amlodipine/Valsartan/HCTZ 5-160-12.5 one week ago.  Since starting blood pressures are better, but still in the 151'V systolic.  She is not having any symptoms of elevated BP She is not having any swelling No CP, ShOB, palpitations She tells me the nurses are providing her with clonidine 0.'1mg'$  daily due to her systolic BP being so elevated.   Past medical history, Surgical history, Family history not pertinant except as noted below, Social history, Allergies, and medications have been entered into the medical record, reviewed, and corrections made.   Review of Systems:  All review of systems negative except what is listed in the HPI  Objective:    Alert and oriented x 4 Speaking in clear sentences with no shortness of breath. No distress.  Impression and Recommendations:    Problem List Items Addressed This Visit     Hypertension associated with diabetes ([place])    Improved BP readings with new medication, but still not at goal. Discussed with patient that we want to ensure we are going slow as to not drop her pressures too quickly and cause side effects. I am pleased with the improvement we have seen.  We will  plan to increase amlodipine/valsartan/hctz to 2 tabs a day (10-320-'25mg'$  total dose) and monitor blood pressures. She will report to me in 1 week if her pressures are controlled.  If not controlled- we will plan to discontinue the PRN clonidine and add scheduled doxazosin '2mg'$  daily with option to titrate up.  Repeat labs in the next few days for CMP. Patient coming tomorrow for PT and can do this at that time.       Relevant Medications   amLODIPine-Valsartan-HCTZ 5-160-12.5 MG TABS   Adjustment disorder with mixed anxiety and depressed mood - Primary   Relevant Orders   Ambulatory referral to Psychology   Hyperlipidemia associated with type 2 diabetes mellitus ([place])   Relevant Medications   amLODIPine-Valsartan-HCTZ 5-160-12.5 MG TABS   Depression, major, single episode, moderate (HCC)   Relevant Orders   Ambulatory referral to Psychology   Type 2 diabetes mellitus with hyperglycemia, without long-term current use of insulin (HCC)   Relevant Medications   amLODIPine-Valsartan-HCTZ 5-160-12.5 MG TABS   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 The patient was provided an opportunity to ask questions and all were answered. The patient agreed with the plan and demonstrated an understanding of the instructions.   The patient was advised to call back or seek an in-person evaluation if  the symptoms worsen or if the condition fails to improve as anticipated.  Follow-Up: 1 week update, 3 months f/u  I provided 18 minutes of non-face-to-face interaction with this non face-to-face encounter including intake, same-day documentation, and chart review.   Orma Render, NP , DNP, AGNP-c [place] [facility] [phone] (fax)

## 2021-07-19 ENCOUNTER — Ambulatory Visit (HOSPITAL_BASED_OUTPATIENT_CLINIC_OR_DEPARTMENT_OTHER): Payer: Medicare HMO | Attending: Nurse Practitioner | Admitting: Physical Therapy

## 2021-07-19 ENCOUNTER — Encounter (HOSPITAL_BASED_OUTPATIENT_CLINIC_OR_DEPARTMENT_OTHER): Payer: Self-pay | Admitting: Physical Therapy

## 2021-07-19 ENCOUNTER — Other Ambulatory Visit (HOSPITAL_BASED_OUTPATIENT_CLINIC_OR_DEPARTMENT_OTHER): Payer: Self-pay | Admitting: Nurse Practitioner

## 2021-07-19 DIAGNOSIS — Z741 Need for assistance with personal care: Secondary | ICD-10-CM | POA: Insufficient documentation

## 2021-07-19 DIAGNOSIS — E785 Hyperlipidemia, unspecified: Secondary | ICD-10-CM | POA: Diagnosis not present

## 2021-07-19 DIAGNOSIS — I152 Hypertension secondary to endocrine disorders: Secondary | ICD-10-CM | POA: Insufficient documentation

## 2021-07-19 DIAGNOSIS — E1165 Type 2 diabetes mellitus with hyperglycemia: Secondary | ICD-10-CM | POA: Insufficient documentation

## 2021-07-19 DIAGNOSIS — M25551 Pain in right hip: Secondary | ICD-10-CM | POA: Diagnosis present

## 2021-07-19 DIAGNOSIS — E1159 Type 2 diabetes mellitus with other circulatory complications: Secondary | ICD-10-CM | POA: Insufficient documentation

## 2021-07-19 DIAGNOSIS — R2689 Other abnormalities of gait and mobility: Secondary | ICD-10-CM | POA: Diagnosis present

## 2021-07-19 DIAGNOSIS — R2681 Unsteadiness on feet: Secondary | ICD-10-CM | POA: Insufficient documentation

## 2021-07-19 DIAGNOSIS — I2510 Atherosclerotic heart disease of native coronary artery without angina pectoris: Secondary | ICD-10-CM | POA: Insufficient documentation

## 2021-07-19 DIAGNOSIS — E1169 Type 2 diabetes mellitus with other specified complication: Secondary | ICD-10-CM | POA: Diagnosis not present

## 2021-07-19 NOTE — Progress Notes (Signed)
OUTPATIENT PHYSICAL THERAPY LOWER EXTREMITY EVALUATION   Patient Name: [name] [name] MRN: [id number] DOB:1940/07/28, 81 y.o., female Today's Date: 07/19/2021    Past Medical History:  Diagnosis Date   Acute non-recurrent pansinusitis 04/20/2021   Acute respiratory failure with hypoxia ([place]) 03/10/2021   Allergy    Anxiety    Arthritis    At risk for allergic reaction to medication 02/04/2021   Blood transfusion without reported diagnosis    Cancer ([place])    Melanoma on Great toe   Candida infection 04/20/2021   Carotid artery plaque, 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   Cataract    Closed fracture of upper end of humerus 11/03/2017   COPD (chronic obstructive pulmonary disease) ([place])    COVID-19 vaccine series started 07/01/2020   COVID-19 virus infection 03/09/2021   Depression    Encounter for Medicare annual wellness exam 07/01/2020   Encounter to establish care 06/03/2020   Essential hypertension 05/19/2019   Essential hypertension 05/19/2019   Female stress incontinence 02/14/2018   GERD (gastroesophageal reflux disease)    History of opioid abuse ([place]) 05/19/2019   Hospital discharge follow-up 03/24/2021   Hyperglycemia 05/19/2019   Hyperlipidemia    Hypertension    Irritation of right eye 12/09/2020   Menopause present 02/11/2018   Need for shingles vaccine 07/01/2020   Osteoporosis    Prediabetes 05/22/2019   Sleep apnea    Subacute cough 03/24/2021   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   Past Surgical History:  Procedure Laterality Date   [place] Right    Great Toe   REPLACEMENT TOTAL KNEE Bilateral 2000   sacral neuromodulation     Patient Active Problem List   Diagnosis Date Noted   Gait instability 07/05/2021   PAD (peripheral artery disease) ([place]) 05/10/2021   Lymphadenopathy, inguinal 05/05/2021   Type 2 diabetes mellitus with hyperglycemia, without long-term current use of insulin ([place]) 03/24/2021    Hypertension associated with diabetes ([place]) 02/04/2021   Lactose intolerance 11/11/2020   Pulmonary nodules 10/21/2020   Neuropathy 07/01/2020   Encounter for medication management 06/03/2020   Coronary artery disease involving native heart without angina pectoris 12/18/2019   History of malignant melanoma 12/18/2019   Basal cell carcinoma of skin 12/18/2019   History of surgery for cerebral aneurysm 12/18/2019   Hyperlipidemia associated with type 2 diabetes mellitus ([place]) 05/22/2019   Adjustment disorder with mixed anxiety and depressed mood 05/19/2019   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 05/19/2019   Bilateral carotid artery occlusion 05/19/2019   Anemia 05/19/2019   Bipolar disorder ([place]) 05/19/2019   Chronic obstructive lung disease ([place]) 05/19/2019   Chronic pain syndrome 05/19/2019   Gastroesophageal reflux disease without esophagitis 05/19/2019   Generalized anxiety disorder 05/19/2019   Hypertensive heart disease without congestive heart failure 05/19/2019   Obstructive sleep apnea syndrome 05/19/2019   Polyp of colon 05/19/2019   Depression, major, single episode, moderate ([place]) 05/19/2019   Spinal stenosis in cervical region 05/19/2019   History of TIA (transient ischemic attack) 05/19/2019   Mixed urinary incontinence due to female genital prolapse 05/19/2019   Tricuspid valve disorders, 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   Panic disorder with agoraphobia 02/22/2018   Diverticulosis of large intestine 02/14/2018   Localized, primary osteoarthritis of shoulder region 02/14/2018   Mitral valve regurgitation 02/14/2018   Grade I internal hemorrhoids 02/14/2018   Bilateral carpal tunnel syndrome 02/11/2018   Morbid obesity ([place]) 02/11/2018  Nondependent opioid abuse in remission ([place]) 02/11/2018    PCP: Orma Render, NP  REFERRING PROVIDER: Orma Render, NP  REFERRING DIAG:  R26.81 (ICD-10-CM) - Gait instability  Z74.1  (ICD-10-CM) - Dependent for walking    THERAPY DIAG:  No diagnosis found.  Rationale for Evaluation and Treatment Rehabilitation  ONSET DATE: Jan 2023  SUBJECTIVE:   SUBJECTIVE STATEMENT: R posterior hip pain  PERTINENT HISTORY: Bil femur fractures  PAIN:  Are you having pain? Yes: NPRS scale: 2/10 Pain location: R posterior hip Pain description: pinching, pulling muscle pain Aggravating factors: Sitting,  Relieving factors: Ice, self-massage  PRECAUTIONS: None  WEIGHT BEARING RESTRICTIONS No  FALLS:  Has patient fallen in last 6 months? No  LIVING ENVIRONMENT: Lives with: lives in an assisted living facility Lives in: House/apartment Stairs: No Has following equipment at home: Gilford Rile - 4 wheeled  OCCUPATION: Retired PTA  PLOF: [place] Wants to be able to visit sister in [place] in October.   OBJECTIVE:   DIAGNOSTIC FINDINGS:  None  PATIENT SURVEYS:  {rehab surveys:24030}  COGNITION:  Overall cognitive status: {cognition:24006}     SENSATION: {sensation:27233}  EDEMA:  {edema:24020}   POSTURE: {posture:25561}  PALPATION: ***  LOWER EXTREMITY ROM:  {AROM/PROM:27142} ROM Right eval Left eval  Hip flexion    Hip extension    Hip abduction    Hip adduction    Hip internal rotation    Hip external rotation    Knee flexion    Knee extension    Ankle dorsiflexion    Ankle plantarflexion    Ankle inversion    Ankle eversion     (Blank rows = not tested)  LOWER EXTREMITY MMT:  MMT Right eval Left eval  Hip flexion 26.2 36.1  Hip extension    Hip abduction 21 27.1 not able to maintain hold   Hip adduction    Hip internal rotation    Hip external rotation    Knee flexion    Knee extension 23 16.5  Ankle dorsiflexion    Ankle plantarflexion    Ankle inversion    Ankle eversion     (Blank rows = not tested)  LOWER EXTREMITY SPECIAL TESTS:  {LEspecialtests:26242}  FUNCTIONAL TESTS:  {Functional  tests:24029}  GAIT: Distance walked: *** Assistive device utilized: {Assistive devices:23999} Level of assistance: {Levels of assistance:24026} Comments: ***    TODAY'S TREATMENT:    PATIENT EDUCATION:  Education details: *** Person educated: Patient Education method: Explanation Education comprehension: verbalized understanding   HOME EXERCISE PROGRAM: ***  ASSESSMENT:  CLINICAL IMPRESSION: Patient is a 81 y.o. F who was seen today for physical therapy evaluation and treatment for ***.    OBJECTIVE IMPAIRMENTS {opptimpairments:25111}.   ACTIVITY LIMITATIONS {activitylimitations:27494}  PARTICIPATION LIMITATIONS: {participationrestrictions:25113}  PERSONAL FACTORS {Personal factors:25162} are also affecting patient's functional outcome.   REHAB POTENTIAL: {rehabpotential:25112}  CLINICAL DECISION MAKING: {clinical decision making:25114}  EVALUATION COMPLEXITY: {Evaluation complexity:25115}   GOALS: Goals reviewed with patient? Yes  SHORT TERM GOALS: Target date: {follow up:25551}  *** Baseline: Goal status: {GOALSTATUS:25110}  2.  *** Baseline:  Goal status: {GOALSTATUS:25110}  3.  *** Baseline:  Goal status: {GOALSTATUS:25110}  4.  *** Baseline:  Goal status: {GOALSTATUS:25110}  5.  *** Baseline:  Goal status: {GOALSTATUS:25110}  6.  *** Baseline:  Goal status: {GOALSTATUS:25110}  LONG TERM GOALS: Target date: {follow up:25551}   *** Baseline:  Goal status: {GOALSTATUS:25110}  2.  *** Baseline:  Goal status: {GOALSTATUS:25110}  3.  *** Baseline:  Goal status: {GOALSTATUS:25110}  4.  *** Baseline:  Goal status: {GOALSTATUS:25110}  5.  *** Baseline:  Goal status: {GOALSTATUS:25110}  6.  *** Baseline:  Goal status: {GOALSTATUS:25110}   PLAN: PT FREQUENCY: {rehab frequency:25116}  PT DURATION: {rehab duration:25117}  PLANNED INTERVENTIONS: {rehab planned interventions:25118::"Therapeutic exercises","Therapeutic  activity","Neuromuscular re-education","Balance training","Gait training","Patient/Family education","Joint mobilization"}  PLAN FOR NEXT SESSION: ***   Carney Living, PT 07/19/2021, 12:04 PM

## 2021-07-19 NOTE — Progress Notes (Incomplete)
OUTPATIENT PHYSICAL THERAPY LOWER EXTREMITY EVALUATION   Patient Name: [name] [name] MRN: [id number] DOB:01-May-1940, 81 y.o., female Today's Date: 07/19/2021   PT End of Session - 07/19/21 1446     Visit Number 1    Number of Visits 13    Date for PT Re-Evaluation 08/30/21    PT Start Time 1346    PT Stop Time 1433    PT Time Calculation (min) 47 min    Activity Tolerance Patient tolerated treatment well    Behavior During Therapy [facility] for tasks assessed/performed             Past Medical History:  Diagnosis Date   Acute non-recurrent pansinusitis 04/20/2021   Acute respiratory failure with hypoxia ([place]) 03/10/2021   Allergy    Anxiety    Arthritis    At risk for allergic reaction to medication 02/04/2021   Blood transfusion without reported diagnosis    Cancer ([place])    Melanoma on Great toe   Candida infection 04/20/2021   Carotid artery plaque, 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   Cataract    Closed fracture of upper end of humerus 11/03/2017   COPD (chronic obstructive pulmonary disease) ([place])    COVID-19 vaccine series started 07/01/2020   COVID-19 virus infection 03/09/2021   Depression    Encounter for Medicare annual wellness exam 07/01/2020   Encounter to establish care 06/03/2020   Essential hypertension 05/19/2019   Essential hypertension 05/19/2019   Female stress incontinence 02/14/2018   GERD (gastroesophageal reflux disease)    History of opioid abuse ([place]) 05/19/2019   Hospital discharge follow-up 03/24/2021   Hyperglycemia 05/19/2019   Hyperlipidemia    Hypertension    Irritation of right eye 12/09/2020   Menopause present 02/11/2018   Need for shingles vaccine 07/01/2020   Osteoporosis    Prediabetes 05/22/2019   Sleep apnea    Subacute cough 03/24/2021   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   Past Surgical History:  Procedure Laterality Date   [place] Right    Great Toe   REPLACEMENT TOTAL KNEE  Bilateral 2000   sacral neuromodulation     Patient Active Problem List   Diagnosis Date Noted   Gait instability 07/05/2021   PAD (peripheral artery disease) ([place]) 05/10/2021   Lymphadenopathy, inguinal 05/05/2021   Type 2 diabetes mellitus with hyperglycemia, without long-term current use of insulin ([place]) 03/24/2021   Hypertension associated with diabetes ([place]) 02/04/2021   Lactose intolerance 11/11/2020   Pulmonary nodules 10/21/2020   Neuropathy 07/01/2020   Encounter for medication management 06/03/2020   Coronary artery disease involving native heart without angina pectoris 12/18/2019   History of malignant melanoma 12/18/2019   Basal cell carcinoma of skin 12/18/2019   History of surgery for cerebral aneurysm 12/18/2019   Hyperlipidemia associated with type 2 diabetes mellitus ([place]) 05/22/2019   Adjustment disorder with mixed anxiety and depressed mood 05/19/2019   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 05/19/2019   Bilateral carotid artery occlusion 05/19/2019   Anemia 05/19/2019   Bipolar disorder ([place]) 05/19/2019   Chronic obstructive lung disease ([place]) 05/19/2019   Chronic pain syndrome 05/19/2019   Gastroesophageal reflux disease without esophagitis 05/19/2019   Generalized anxiety disorder 05/19/2019   Hypertensive heart disease without congestive heart failure 05/19/2019   Obstructive sleep apnea syndrome 05/19/2019   Polyp of colon 05/19/2019   Depression, major, single episode, moderate ([place]) 05/19/2019   Spinal  stenosis in cervical region 05/19/2019   History of TIA (transient ischemic attack) 05/19/2019   Mixed urinary incontinence due to female genital prolapse 05/19/2019   Tricuspid valve disorders, 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   Panic disorder with agoraphobia 02/22/2018   Diverticulosis of large intestine 02/14/2018   Localized, primary osteoarthritis of shoulder region 02/14/2018   Mitral valve regurgitation  02/14/2018   Grade I internal hemorrhoids 02/14/2018   Bilateral carpal tunnel syndrome 02/11/2018   Morbid obesity ([place]) 02/11/2018   Nondependent opioid abuse in remission ([place]) 02/11/2018    PCP: Orma Render, NP  REFERRING PROVIDER: Orma Render, NP  REFERRING DIAG:  R26.81 (ICD-10-CM) - Gait instability  Z74.1 (ICD-10-CM) - Dependent for walking    THERAPY DIAG:  Other abnormalities of gait and mobility  Pain in right hip  Rationale for Evaluation and Treatment Rehabilitation  ONSET DATE: Jan 2023  SUBJECTIVE:   SUBJECTIVE STATEMENT: Pt presents today with ongoing R posterior hip pain; chronic and insidious onset approx. 6 mo. ago. Pt also reports generalized feeling of weakness in the legs, difficulty standing or walking secondary to pain, and trouble completing ADLs. Pt is a resident at a local assisted living facility; uses a 4-wheel walker for ambulation.   PERTINENT HISTORY: Bil femur fractures  PAIN:  Are you having pain? Yes: NPRS scale: 2/10 Pain location: R posterolateral hip Pain description: pinching, pulling muscle pain Aggravating factors: Sitting, standing>44mn, walking>161m  Relieving factors: Ice, self-massage, stretching  PRECAUTIONS: None  WEIGHT BEARING RESTRICTIONS No  FALLS:  Has patient fallen in last 6 months? No  LIVING ENVIRONMENT: Lives with: lives in an assisted living facility Lives in: House/apartment Stairs: No Has following equipment at home: Wa[place]ford Rile 4 wheeled  OCCUPATION: Retired PTA  PLOF: Independent  PATIENT GOALS: Wants to improve walking/fitness and [place]   OBJECTIVE:   DIAGNOSTIC FINDINGS:  None  PATIENT SURVEYS:  FOTO ***  COGNITION:  Overall cognitive status: Within functional limits for tasks assessed     SENSATION: WFL  EDEMA:  {edema:24020}   POSTURE: rounded shoulders, forward head, and flexed trunk   PALPATION: Tender to palpation of  glute med/min, piriformis, IT band  LOWER EXTREMITY ROM:  Active ROM Right eval Left eval  Hip flexion Limited, concordant pain Limited  Hip extension    Hip abduction    Hip adduction    Hip internal rotation Painful   Hip external rotation Painful   Knee flexion    Knee extension Painful   Ankle dorsiflexion Limited Limited  Ankle plantarflexion    Ankle inversion    Ankle eversion     (Blank rows = not tested)  LOWER EXTREMITY MMT:  MMT Right eval Left eval  Hip flexion 26.2 36.1  Hip extension    Hip abduction 21 27.1 not able to maintain hold   Hip adduction    Hip internal rotation    Hip external rotation    Knee flexion    Knee extension 23 16.5  Ankle dorsiflexion    Ankle plantarflexion    Ankle inversion    Ankle eversion     (Blank rows = not tested)    FUNCTIONAL TESTS:  Timed up and go (TUG): 21sec   TODAY'S TREATMENT:    PATIENT EDUCATION:  Education details: *** Person educated: Patient Education method: Explanation Education comprehension: verbalized understanding   HOME EXERCISE PROGRAM: ***  ASSESSMENT:  CLINICAL IMPRESSION: Patient is a [age].o. F who  was seen today for physical therapy evaluation and treatment for ***.    OBJECTIVE IMPAIRMENTS {opptimpairments:25111}.   ACTIVITY LIMITATIONS {activitylimitations:27494}  PARTICIPATION LIMITATIONS: {participationrestrictions:25113}  PERSONAL FACTORS {Personal factors:25162} are also affecting patient's functional outcome.   REHAB POTENTIAL: {rehabpotential:25112}  CLINICAL DECISION MAKING: {clinical decision making:25114}  EVALUATION COMPLEXITY: {Evaluation complexity:25115}   GOALS: Goals reviewed with patient? Yes  SHORT TERM GOALS: Target date: {follow up:25551}  *** Baseline: Goal status: {GOALSTATUS:25110}  2.  *** Baseline:  Goal status: {GOALSTATUS:25110}  3.  *** Baseline:  Goal status: {GOALSTATUS:25110}  4.  *** Baseline:  Goal status:  {GOALSTATUS:25110}  5.  *** Baseline:  Goal status: {GOALSTATUS:25110}  6.  *** Baseline:  Goal status: {GOALSTATUS:25110}  LONG TERM GOALS: Target date: {follow up:25551}   *** Baseline:  Goal status: {GOALSTATUS:25110}  2.  *** Baseline:  Goal status: {GOALSTATUS:25110}  3.  *** Baseline:  Goal status: {GOALSTATUS:25110}  4.  *** Baseline:  Goal status: {GOALSTATUS:25110}  5.  *** Baseline:  Goal status: {GOALSTATUS:25110}  6.  *** Baseline:  Goal status: {GOALSTATUS:25110}   PLAN: PT FREQUENCY: {rehab frequency:25116}  PT DURATION: {rehab duration:25117}  PLANNED INTERVENTIONS: {rehab planned interventions:25118::"Therapeutic exercises","Therapeutic activity","Neuromuscular re-education","Balance training","Gait training","Patient/Family education","Joint mobilization"}  PLAN FOR NEXT SESSION: ***   Lenell Antu, Student-PT 07/19/2021, 2:48 PM

## 2021-07-20 ENCOUNTER — Encounter (HOSPITAL_BASED_OUTPATIENT_CLINIC_OR_DEPARTMENT_OTHER): Payer: Self-pay | Admitting: Physical Therapy

## 2021-07-26 ENCOUNTER — Encounter: Payer: Self-pay | Admitting: Obstetrics and Gynecology

## 2021-07-26 ENCOUNTER — Telehealth: Payer: Self-pay

## 2021-07-26 ENCOUNTER — Other Ambulatory Visit (HOSPITAL_COMMUNITY)
Admission: RE | Admit: 2021-07-26 | Discharge: 2021-07-26 | Disposition: A | Discharge status: Home / Self Care | Payer: Medicare HMO | Source: Ambulatory Visit | Attending: Obstetrics and Gynecology | Admitting: Obstetrics and Gynecology

## 2021-07-26 ENCOUNTER — Ambulatory Visit (INDEPENDENT_AMBULATORY_CARE_PROVIDER_SITE_OTHER): Payer: Medicare HMO | Admitting: Obstetrics and Gynecology

## 2021-07-26 VITALS — BP 127/68 | HR 80

## 2021-07-26 DIAGNOSIS — B379 Candidiasis, unspecified: Secondary | ICD-10-CM

## 2021-07-26 DIAGNOSIS — N390 Urinary tract infection, site not specified: Secondary | ICD-10-CM | POA: Diagnosis not present

## 2021-07-26 DIAGNOSIS — N952 Postmenopausal atrophic vaginitis: Secondary | ICD-10-CM

## 2021-07-26 DIAGNOSIS — B3731 Acute candidiasis of vulva and vagina: Secondary | ICD-10-CM | POA: Diagnosis not present

## 2021-07-26 DIAGNOSIS — R350 Frequency of micturition: Secondary | ICD-10-CM

## 2021-07-26 LAB — POCT URINALYSIS DIPSTICK
Appearance: NORMAL
Bilirubin, UA: NEGATIVE
Blood, UA: NEGATIVE
Glucose, UA: POSITIVE — AB
Ketones, UA: NEGATIVE
Leukocytes, UA: NEGATIVE
Nitrite, UA: NEGATIVE
Protein, UA: NEGATIVE
Spec Grav, UA: 1.02 (ref 1.010–1.025)
Urobilinogen, UA: 0.2 E.U./dL
pH, UA: 5.5 (ref 5.0–8.0)

## 2021-07-26 MED ORDER — ESTRADIOL 2 MG VA RING
2.0000 mg | VAGINAL_RING | VAGINAL | 4 refills | Status: DC
Start: 2021-07-26 — End: 2021-09-06

## 2021-07-26 MED ORDER — FLUCONAZOLE 150 MG PO TABS: 150.0000 mg | ORAL_TABLET | Freq: Once | ORAL | 0 refills | Status: AC

## 2021-07-26 MED ORDER — CEPHALEXIN 250 MG PO CAPS: 250.0000 mg | ORAL_CAPSULE | Freq: Every day | ORAL | 5 refills | Status: DC

## 2021-07-26 NOTE — Progress Notes (Signed)
[facility] Return Visit  SUBJECTIVE  History of Present Illness: [name] [name] is a 81 y.o. female seen in follow-up for recurrent urinary tract infections.   Has been using the estrogen cream but unable to really get it inside of the vagina. Currently taking 265m keflex daily. Has some vaginal burning/ discomfort and unsure if she has an infection.  Has Interstim device. Having more urinary frequency. Voiding once per hour.   Past Medical History: Patient  has a past medical history of Acute non-recurrent pansinusitis (04/20/2021), Acute respiratory failure with hypoxia ([place] (03/10/2021), Allergy, Anxiety, Arthritis, At risk for allergic reaction to medication (02/04/2021), Blood transfusion without reported diagnosis, Cancer ([place], Candida infection (04/20/2021), Carotid artery plaque, bilateral (02/27/2018), Cataract, Closed fracture of upper end of humerus (11/03/2017), COPD (chronic obstructive pulmonary disease) ([place], COVID-19 vaccine series started (07/01/2020), COVID-19 virus infection (03/09/2021), Depression, Encounter for Medicare annual wellness exam (07/01/2020), Encounter to establish care (06/03/2020), Essential hypertension (05/19/2019), Essential hypertension (05/19/2019), Female stress incontinence (02/14/2018), GERD (gastroesophageal reflux disease), History of opioid abuse ([place] (05/19/2019), Hospital discharge follow-up (03/24/2021), Hyperglycemia (05/19/2019), Hyperlipidemia, Hypertension, Irritation of right eye (12/09/2020), Menopause present (02/11/2018), Need for shingles vaccine (07/01/2020), Osteoporosis, Prediabetes (05/22/2019), Sleep apnea, Subacute cough (03/24/2021), and 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   Past Surgical History: She  has a past surgical history that includes Abdominal hysterectomy (1980); Melanoma excision (Right); Cholecystectomy (1980); Replacement total knee (Bilateral, 2000); and sacral neuromodulation.   Medications: She has a current  medication list which includes the following prescription(s): albuterol, alprazolam, AMBULATORY NON FORMULARY MEDICATION, amlodipine-valsartan-hctz, arthritis pain relieving, atorvastatin, probiotic formula, blood glucose meter kit and supplies, sarna, carvedilol, cephalexin, cetirizine, clonidine, clonidine, dapagliflozin propanediol, diphenhydramine, easymax test, eliquis, estradiol, estradiol, famotidine, ferosul, fluconazole, fluconazole, guaifenesin, hydralazine, lactase, mirtazapine, pantoprazole, paroxetine, and trelegy ellipta.   Allergies: Patient is allergic to azithromycin, bee venom, erythromycin base, metronidazole, nitrofurantoin macrocrystal, penicillins, sulfa antibiotics, cymbalta [duloxetine hcl], ivp dye [iodinated contrast media], penicillin g, codeine, erythromycin, fosfomycin, levofloxacin, nitrofurantoin, and percocet [oxycodone-acetaminophen].   Social History: Patient  reports that she quit smoking about 35 years ago. Her smoking use included cigarettes. She started smoking about 66 years ago. She has never used smokeless tobacco. She reports that she does not currently use alcohol. She reports that she does not use drugs.      OBJECTIVE     Physical Exam: Vitals:   07/26/21 1507  BP: 127/68  Pulse: 80   Gen: No apparent distress, A&O x 3.  Detailed Urogynecologic Evaluation:  Normal external genitalia. Thick white discharge present on inner labia. Speculum exam does not show any discharge, atrophy present.    POC urine: negative  ASSESSMENT AND PLAN    [name] a 81y.o. with:  1. Yeast infection   2. Recurrent UTI   3. Vaginal atrophy     - No sign of UTI today.  - Refill provided of Keflex 2558mdaily for UTI prevention.  -Discussed that estrogen can also be used for prevention but has been unable to place properly. Will prescribe estring and she can come for placement in the office q3 months.  - Suspect yeast infection. Aptima swab obtained.  Will treat with diflucan 15022mO x1 - Will reassess urine frequency symptoms and urgency once infection is cleared up. Will need to check interstim device.   Return for estring placement.   MicJaquita FoldsD

## 2021-07-26 NOTE — Addendum Note (Signed)
Addended by: Elita Quick on: 07/26/2021 04:44 PM   Modules accepted: Orders

## 2021-07-27 ENCOUNTER — Other Ambulatory Visit (HOSPITAL_BASED_OUTPATIENT_CLINIC_OR_DEPARTMENT_OTHER): Payer: Self-pay

## 2021-07-27 LAB — CERVICOVAGINAL ANCILLARY ONLY
Bacterial Vaginitis (gardnerella): NEGATIVE
Candida Glabrata: POSITIVE — AB
Candida Vaginitis: POSITIVE — AB
Comment: NEGATIVE
Comment: NEGATIVE
Comment: NEGATIVE

## 2021-07-28 MED ORDER — FLUCONAZOLE 150 MG PO TABS: 150.0000 mg | ORAL_TABLET | Freq: Once | ORAL | 1 refills | Status: DC | PRN

## 2021-07-29 ENCOUNTER — Telehealth: Payer: Self-pay

## 2021-08-01 NOTE — Progress Notes (Signed)
After several attempts. Pt was finally contacted and notified of the message from Dr. Florian Buff. Pt verbalized understanding

## 2021-08-02 ENCOUNTER — Ambulatory Visit (HOSPITAL_BASED_OUTPATIENT_CLINIC_OR_DEPARTMENT_OTHER): Payer: Medicare HMO | Admitting: Nurse Practitioner

## 2021-08-04 ENCOUNTER — Ambulatory Visit (HOSPITAL_BASED_OUTPATIENT_CLINIC_OR_DEPARTMENT_OTHER): Payer: Medicare HMO | Admitting: Physical Therapy

## 2021-08-04 DIAGNOSIS — R2689 Other abnormalities of gait and mobility: Secondary | ICD-10-CM | POA: Diagnosis not present

## 2021-08-04 DIAGNOSIS — M25551 Pain in right hip: Secondary | ICD-10-CM

## 2021-08-04 NOTE — Therapy (Signed)
Therapist)               PT End of Session - 08/04/21 1310     Visit Number 1    Number of Visits 13    Date for PT Re-Evaluation 08/30/21    PT Start Time 1346    PT Stop Time 2426    PT Time Calculation (min) 47 min    Activity Tolerance Patient tolerated treatment well    Behavior During Therapy [facility] for tasks assessed/performed                OUTPATIENT PHYSICAL THERAPY LOWER EXTREMITY EVALUATION     Patient Name: [name] [name] MRN: [id number] DOB:[birth year], 81 y.o., female Today's Date: 07/19/2021           Past Medical History:  Diagnosis Date   Acute non-recurrent pansinusitis 04/20/2021   Acute respiratory failure with hypoxia ([place]) 03/10/2021   Allergy     Anxiety     Arthritis     At risk for allergic reaction to medication 02/04/2021   Blood transfusion without reported diagnosis     Cancer ([place])      Melanoma on Great toe   Candida infection 04/20/2021   Carotid artery plaque, 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   Cataract     Closed fracture of upper end of humerus 11/03/2017   COPD (chronic obstructive pulmonary disease) ([place])     COVID-19 vaccine series started 07/01/2020   COVID-19 virus infection 03/09/2021   Depression     Encounter for Medicare annual wellness exam 07/01/2020   Encounter to establish care 06/03/2020   Essential hypertension 05/19/2019   Essential hypertension 05/19/2019   Female stress incontinence 02/14/2018   GERD (gastroesophageal reflux disease)     History of opioid abuse ([place]) 05/19/2019   Hospital discharge follow-up 03/24/2021   Hyperglycemia 05/19/2019   Hyperlipidemia     Hypertension     Irritation of right eye 12/09/2020   Menopause present 02/11/2018   Need for shingles vaccine 07/01/2020   Osteoporosis     Prediabetes 05/22/2019   Sleep apnea     Subacute cough 03/24/2021   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         Past Surgical History:  Procedure Laterality Date   [place] Right      Great Toe   REPLACEMENT TOTAL KNEE Bilateral 2000   sacral neuromodulation            Patient Active Problem List    Diagnosis Date Noted   Gait instability 07/05/2021   PAD (peripheral artery disease) ([place]) 05/10/2021   Lymphadenopathy, inguinal 05/05/2021   Type 2 diabetes mellitus with hyperglycemia, without long-term current use of insulin ([place]) 03/24/2021   Hypertension associated with diabetes ([place]) 02/04/2021   Lactose intolerance 11/11/2020   Pulmonary nodules 10/21/2020   Neuropathy 07/01/2020   Encounter for medication management 06/03/2020   Coronary artery disease involving native heart without angina pectoris 12/18/2019   History of malignant melanoma 12/18/2019   Basal cell carcinoma of skin 12/18/2019   History of surgery for cerebral aneurysm 12/18/2019   Hyperlipidemia associated with type 2 diabetes mellitus ([place]) 05/22/2019   Adjustment disorder with mixed anxiety and depressed mood 05/19/2019   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 05/19/2019   Bilateral carotid artery occlusion 05/19/2019   Anemia 05/19/2019   Bipolar disorder ([place]) 05/19/2019   Chronic  obstructive lung disease ([place]) 05/19/2019   Chronic pain syndrome 05/19/2019   Gastroesophageal reflux disease without esophagitis 05/19/2019   Generalized anxiety disorder 05/19/2019   Hypertensive heart disease without congestive heart failure 05/19/2019   Obstructive sleep apnea syndrome 05/19/2019   Polyp of colon 05/19/2019   Depression, major, single episode, moderate ([place]) 05/19/2019   Spinal stenosis in cervical region 05/19/2019   History of TIA (transient ischemic attack) 05/19/2019   Mixed urinary incontinence due to female genital prolapse 05/19/2019   Tricuspid valve disorders, 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   Panic disorder with agoraphobia 02/22/2018   Diverticulosis of large intestine 02/14/2018   Localized, primary  osteoarthritis of shoulder region 02/14/2018   Mitral valve regurgitation 02/14/2018   Grade I internal hemorrhoids 02/14/2018   Bilateral carpal tunnel syndrome 02/11/2018   Morbid obesity ([place]) 02/11/2018   Nondependent opioid abuse in remission ([place]) 02/11/2018      PCP: Orma Render, NP   REFERRING PROVIDER: Orma Render, NP   REFERRING DIAG:  R26.81 (ICD-10-CM) - Gait instability  Z74.1 (ICD-10-CM) - Dependent for walking      THERAPY DIAG:  Other abnormalities of gait and mobility   Pain in right hip   Rationale for Evaluation and Treatment Rehabilitation   ONSET DATE: Jan 2023   SUBJECTIVE:    SUBJECTIVE STATEMENT: Pt presents today with ongoing R posterior hip pain; chronic and insidious onset approx. 6 mo. ago. Pt also reports generalized feeling of weakness in the legs (from a decrease in activity level) and trouble completing ADLs. Pt is a resident at a local assisted living facility; uses a 4-wheel walker for ambulation. Patient reports moderate to high difficulty secondary to pain/weakness with bed mobility, transfers from chair, and with longer periods of standing and walking; pain is eased with ice and stretching. Pt wants to improve pain, enhance mobility within the home and community, and increase fitness before a trip in October to be able to participate in activities.   PERTINENT HISTORY: Bil femur fractures, T2DM, CAD, cancer (melanoma) and subsequent R great toe amputation, anxiety   PAIN:  Are you having pain? Yes: NPRS scale: 2/10 Pain location: R posterolateral hip Pain description: pinching, pulling muscle pain Aggravating factors: Sitting, standing>71mn, walking>144m  Relieving factors: Ice, self-massage, stretching   PRECAUTIONS: None   WEIGHT BEARING RESTRICTIONS No   FALLS:  Has patient fallen in last 6 months? No   LIVING ENVIRONMENT: Lives with: lives in an assisted living facility Lives in: House/apartment Stairs: No Has  following equipment at home: [employer] 4 wheeled   OCCUPATION: Retired PTA   PLOF: Independent   PATIENT GOALS: Wants to improve walking/fitness and be able to visit her sister in [place] October.     OBJECTIVE:    DIAGNOSTIC FINDINGS:  None   PATIENT SURVEYS:  FOTO 44                        Expected 5554n 11 visits   COGNITION:           Overall cognitive status: Within functional limits for tasks assessed                          SENSATION: Impaired - pt reports decreased sensation in bil feet from T2DM neuropathy     POSTURE: rounded shoulders, forward head, and flexed trunk    PALPATION: Tender to palpation of glute med/min, piriformis, IT band  LOWER EXTREMITY ROM:   Active ROM Right eval Left eval  Hip flexion Limited, concordant pain Limited  Hip extension      Hip abduction      Hip adduction      Hip internal rotation Painful    Hip external rotation Painful    Knee flexion      Knee extension Painful    Ankle dorsiflexion Limited Limited  Ankle plantarflexion      Ankle inversion      Ankle eversion       (Blank rows = not tested)   LOWER EXTREMITY MMT:   MMT Right eval Left eval  Hip flexion 26.2 36.1  Hip extension      Hip abduction 21 27.1 not able to maintain hold   Hip adduction      Hip internal rotation      Hip external rotation      Knee flexion      Knee extension 23 16.5  Ankle dorsiflexion      Ankle plantarflexion      Ankle inversion      Ankle eversion       (Blank rows = not tested)       FUNCTIONAL TESTS:  Timed up and go (TUG): 21sec -Pt required use of UE to get out of chair     TODAY'S TREATMENT: Eval Stretching - knee to chest, piriformis Educaiton on plan of care and HEP       PATIENT EDUCATION:  Education details: HEP, exercise, safety with transfers Person educated: Patient Education method: Explanation Education comprehension: verbalized understanding     HOME EXERCISE PROGRAM: -Gentle  stretching           -Knee-to-chest           -Piriformis stretch -Self-release of trigger points   ASSESSMENT:   CLINICAL IMPRESSION: Patient is a 81 y.o. F who was seen today for physical therapy evaluation and treatment for chronic R hip pain; she is highly motivated to improve fitness and walking before an upcoming family visit in October. Pt has most significant limitations with range of motion, hip strength, and tolerance to standing and walking. TUG score of 21 seconds indicates patient is at higher risk for falls and will benefit from education on safety with mobility and transfers. Pt has signs and symptoms consistent with GTPS/lateral hip pain and will benefit from skilled therapy to address impairments, improve strength and stability, and improve tolerance to walking.     OBJECTIVE IMPAIRMENTS decreased balance, decreased endurance, difficulty walking, decreased ROM, and decreased strength.    ACTIVITY LIMITATIONS bending, standing, stairs, transfers, and bed mobility   PARTICIPATION LIMITATIONS: shopping and community activity   PERSONAL FACTORS Age, Fitness, Time since onset of injury/illness/exacerbation, and 3+ comorbidities: (see PMH)  are also affecting patient's functional outcome.    REHAB POTENTIAL: Good   CLINICAL DECISION MAKING: Evolving/moderate complexity   EVALUATION COMPLEXITY: Moderate     GOALS: Goals reviewed with patient? Yes   SHORT TERM GOALS: Target date: 08/09/2021  Pt will improve hip flexion and ankle dorsiflexion ROM to [facility] to improve bed mobility and gait mechanics. Baseline: Goal status: INITIAL   2.  Pt will be able to transfer from supine to seated and from sit-standing without pain or assistance. Baseline:  Goal status: INITIAL   3.  Pt will improve  Baseline:  Goal status: INITIAL     LONG TERM GOALS: Target date: 08/30/2021    Pt will improve TUG  score by MCID of 3.4 seconds to demonstrate a reduced risk for falls. Baseline:   Goal status: INITIAL   2.  Pt will be able to stand or ambulate 20 minutes with normalized gait mechanics without pain. Baseline:  Goal status: INITIAL   3.  Pt will be fully independent with exercise program to maintain current level of fitness. Baseline:  Goal status: INITIAL     PLAN: PT FREQUENCY: 1-2x/week   PT DURATION: 6 weeks   PLANNED INTERVENTIONS: Therapeutic exercises, Therapeutic activity, Neuromuscular re-education, Balance training, Gait training, Patient/Family education, Joint mobilization, Stair training, Aquatic Therapy, Dry Needling, Cryotherapy, and Moist heat   PLAN FOR NEXT SESSION: STM/IASTM/TrPDN, gentle stretching, hip strengthening, gait training, aerobic endurance assessment     Lenell Antu, Student-PT 07/19/2021, 2:48 PM               Carney Living, PT 07/19/2021, 12:04 PM   During this treatment session, the therapist was present, participating in and directing the treatment.

## 2021-08-04 NOTE — Therapy (Signed)
Therapist)               PT End of Session - 08/04/21 1355     Visit Number 2    Number of Visits 13    Date for PT Re-Evaluation 08/30/21    PT Start Time 1234    PT Stop Time 4742    PT Time Calculation (min) 43 min    Activity Tolerance Patient tolerated treatment well    Behavior During Therapy [facility] for tasks assessed/performed                OUTPATIENT PHYSICAL THERAPY LOWER EXTREMITY EVALUATION     Patient Name: [name] [name] MRN: [id number] DOB:1940-08-28, 81 y.o., female Today's Date: 07/19/2021           Past Medical History:  Diagnosis Date   Acute non-recurrent pansinusitis 04/20/2021   Acute respiratory failure with hypoxia ([place]) 03/10/2021   Allergy     Anxiety     Arthritis     At risk for allergic reaction to medication 02/04/2021   Blood transfusion without reported diagnosis     Cancer ([place])      Melanoma on Great toe   Candida infection 04/20/2021   Carotid artery plaque, 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   Cataract     Closed fracture of upper end of humerus 11/03/2017   COPD (chronic obstructive pulmonary disease) ([place])     COVID-19 vaccine series started 07/01/2020   COVID-19 virus infection 03/09/2021   Depression     Encounter for Medicare annual wellness exam 07/01/2020   Encounter to establish care 06/03/2020   Essential hypertension 05/19/2019   Essential hypertension 05/19/2019   Female stress incontinence 02/14/2018   GERD (gastroesophageal reflux disease)     History of opioid abuse ([place]) 05/19/2019   Hospital discharge follow-up 03/24/2021   Hyperglycemia 05/19/2019   Hyperlipidemia     Hypertension     Irritation of right eye 12/09/2020   Menopause present 02/11/2018   Need for shingles vaccine 07/01/2020   Osteoporosis     Prediabetes 05/22/2019   Sleep apnea     Subacute cough 03/24/2021   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         Past Surgical History:  Procedure Laterality Date   [place] Right      Great Toe   REPLACEMENT TOTAL KNEE Bilateral 2000   sacral neuromodulation            Patient Active Problem List    Diagnosis Date Noted   Gait instability 07/05/2021   PAD (peripheral artery disease) ([place]) 05/10/2021   Lymphadenopathy, inguinal 05/05/2021   Type 2 diabetes mellitus with hyperglycemia, without long-term current use of insulin ([place]) 03/24/2021   Hypertension associated with diabetes ([place]) 02/04/2021   Lactose intolerance 11/11/2020   Pulmonary nodules 10/21/2020   Neuropathy 07/01/2020   Encounter for medication management 06/03/2020   Coronary artery disease involving native heart without angina pectoris 12/18/2019   History of malignant melanoma 12/18/2019   Basal cell carcinoma of skin 12/18/2019   History of surgery for cerebral aneurysm 12/18/2019   Hyperlipidemia associated with type 2 diabetes mellitus ([place]) 05/22/2019   Adjustment disorder with mixed anxiety and depressed mood 05/19/2019   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 05/19/2019   Bilateral carotid artery occlusion 05/19/2019   Anemia 05/19/2019   Bipolar disorder ([place]) 05/19/2019   Chronic  obstructive lung disease ([place]) 05/19/2019   Chronic pain syndrome 05/19/2019   Gastroesophageal reflux disease without esophagitis 05/19/2019   Generalized anxiety disorder 05/19/2019   Hypertensive heart disease without congestive heart failure 05/19/2019   Obstructive sleep apnea syndrome 05/19/2019   Polyp of colon 05/19/2019   Depression, major, single episode, moderate ([place]) 05/19/2019   Spinal stenosis in cervical region 05/19/2019   History of TIA (transient ischemic attack) 05/19/2019   Mixed urinary incontinence due to female genital prolapse 05/19/2019   Tricuspid valve disorders, 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   Panic disorder with agoraphobia 02/22/2018   Diverticulosis of large intestine 02/14/2018   Localized, primary  osteoarthritis of shoulder region 02/14/2018   Mitral valve regurgitation 02/14/2018   Grade I internal hemorrhoids 02/14/2018   Bilateral carpal tunnel syndrome 02/11/2018   Morbid obesity ([place]) 02/11/2018   Nondependent opioid abuse in remission ([place]) 02/11/2018       PCP: Orma Render, NP   REFERRING PROVIDER: Orma Render, NP   REFERRING DIAG:  R26.81 (ICD-10-CM) - Gait instability  Z74.1 (ICD-10-CM) - Dependent for walking      THERAPY DIAG:  Other abnormalities of gait and mobility   Pain in right hip   Rationale for Evaluation and Treatment Rehabilitation   ONSET DATE: Jan 2023   SUBJECTIVE:    SUBJECTIVE STATEMENT: Pt says her hip is feeling slightly improved, she is having more pain in her low back today. She has been able to sleep better. Her c/c is stiffness and discomfort in standing - she wants to work on strength and core to improve.   PERTINENT HISTORY: Bil femur fractures, T2DM, CAD, cancer (melanoma) and subsequent R great toe amputation, anxiety   PAIN:  Are you having pain? Yes: NPRS scale: 2/10 Pain location: R posterolateral hip Pain description: pinching, pulling muscle pain Aggravating factors: Sitting, standing>8mn, walking>171m  Relieving factors: Ice, self-massage, stretching   PRECAUTIONS: None   WEIGHT BEARING RESTRICTIONS No   FALLS:  Has patient fallen in last 6 months? No   LIVING ENVIRONMENT: Lives with: lives in an assisted living facility Lives in: House/apartment Stairs: No Has following equipment at home: [employer] 4 wheeled   OCCUPATION: Retired PTA   PLOF: Independent   PATIENT GOALS: Wants to improve walking/fitness and be able to visit her sister in [place] October.     OBJECTIVE:    DIAGNOSTIC FINDINGS:  None   PATIENT SURVEYS:  FOTO 44                        Expected 5534n 11 visits   COGNITION:           Overall cognitive status: Within functional limits for tasks assessed                           SENSATION: Impaired - pt reports decreased sensation in bil feet from T2DM neuropathy     POSTURE: rounded shoulders, forward head, and flexed trunk    PALPATION: Tender to palpation of glute med/min, piriformis, IT band   LOWER EXTREMITY ROM:   Active ROM Right eval Left eval  Hip flexion Limited, concordant pain Limited  Hip extension      Hip abduction      Hip adduction      Hip internal rotation Painful    Hip external rotation Painful    Knee flexion      Knee extension Painful  Ankle dorsiflexion Limited Limited  Ankle plantarflexion      Ankle inversion      Ankle eversion       (Blank rows = not tested)   LOWER EXTREMITY MMT:   MMT Right eval Left eval  Hip flexion 26.2 36.1  Hip extension      Hip abduction 21 27.1 not able to maintain hold   Hip adduction      Hip internal rotation      Hip external rotation      Knee flexion      Knee extension 23 16.5  Ankle dorsiflexion      Ankle plantarflexion      Ankle inversion      Ankle eversion       (Blank rows = not tested)       FUNCTIONAL TESTS:  Timed up and go (TUG): 21sec -Pt required use of UE to get out of chair     TODAY'S TREATMENT: Manual - STM and TrP release to R glute med/max Education on theracane use for TrP release Seated hip abd - 2x10, red band Seated marches - 2x10, 2.5lb Reviewed HEP and stretching       PATIENT EDUCATION:  Education details: HEP, exercise, safety with transfers Person educated: Patient Education method: Explanation Education comprehension: verbalized understanding     HOME EXERCISE PROGRAM: -Gentle stretching           -Knee-to-chest           -Piriformis stretch -Self-release of trigger points   ASSESSMENT:   CLINICAL IMPRESSION:   Pt has made improvement since evaluation. Her pain is decreased overall, she does complain of some stiffness in her back with standing. She had multiple trigger points in R glute med/max that she reports felt  much improved following skilled STM and release. Patient was given education on theracane use for self-massage at home. She tolerated exercises well; was also given cues for posture and core stability to improve standing mechanics. She will continue to benefit from skilled therapy to address impairments and guide pt to desired level of function.     OBJECTIVE IMPAIRMENTS decreased balance, decreased endurance, difficulty walking, decreased ROM, and decreased strength.    ACTIVITY LIMITATIONS bending, standing, stairs, transfers, and bed mobility   PARTICIPATION LIMITATIONS: shopping and community activity   PERSONAL FACTORS Age, Fitness, Time since onset of injury/illness/exacerbation, and 3+ comorbidities: (see PMH)  are also affecting patient's functional outcome.    REHAB POTENTIAL: Good   CLINICAL DECISION MAKING: Evolving/moderate complexity   EVALUATION COMPLEXITY: Moderate     GOALS: Goals reviewed with patient? Yes   SHORT TERM GOALS: Target date: 08/09/2021  Pt will improve hip flexion and ankle dorsiflexion ROM to [facility] to improve bed mobility and gait mechanics. Baseline: Goal status: INITIAL   2.  Pt will be able to transfer from supine to seated and from sit-standing without pain or assistance. Baseline:  Goal status: INITIAL   3.  Pt will improve bil hip abduction strength by 20%. Baseline:  Goal status: INITIAL     LONG TERM GOALS: Target date: 08/30/2021    Pt will improve TUG score by MCID of 3.4 seconds to demonstrate a reduced risk for falls. Baseline:  Goal status: INITIAL   2.  Pt will be able to stand or ambulate 20 minutes with normalized gait mechanics without pain. Baseline:  Goal status: INITIAL   3.  Pt will be fully independent with exercise program to maintain current  level of fitness. Baseline:  Goal status: INITIAL     PLAN: PT FREQUENCY: 1-2x/week   PT DURATION: 6 weeks   PLANNED INTERVENTIONS: Therapeutic exercises, Therapeutic  activity, Neuromuscular re-education, Balance training, Gait training, Patient/Family education, Joint mobilization, Stair training, Aquatic Therapy, Dry Needling, Cryotherapy, and Moist heat   PLAN FOR NEXT SESSION: Continue STM as indicated. Lateral hip and core strengthening, gait training. Consider aerobic endurance assessment.     Lenell Antu, Student-PT 07/19/2021, 2:48 PM               Carney Living, PT 07/19/2021, 12:04 PM      Carney Living, PT 07/19/2021, 12:04 PM   During this treatment session, the therapist was present, participating in and directing the treatment.

## 2021-08-05 ENCOUNTER — Encounter (HOSPITAL_BASED_OUTPATIENT_CLINIC_OR_DEPARTMENT_OTHER): Payer: Medicare HMO | Admitting: Physical Therapy

## 2021-08-15 ENCOUNTER — Ambulatory Visit: Payer: Medicare HMO | Admitting: Internal Medicine

## 2021-08-15 ENCOUNTER — Other Ambulatory Visit (HOSPITAL_BASED_OUTPATIENT_CLINIC_OR_DEPARTMENT_OTHER): Payer: Medicare HMO

## 2021-08-16 ENCOUNTER — Ambulatory Visit (HOSPITAL_BASED_OUTPATIENT_CLINIC_OR_DEPARTMENT_OTHER): Payer: Medicare HMO | Attending: Nurse Practitioner | Admitting: Physical Therapy

## 2021-08-16 DIAGNOSIS — R2689 Other abnormalities of gait and mobility: Secondary | ICD-10-CM | POA: Diagnosis not present

## 2021-08-16 DIAGNOSIS — M25551 Pain in right hip: Secondary | ICD-10-CM | POA: Diagnosis present

## 2021-08-16 NOTE — Therapy (Incomplete)
Therapist)                    OUTPATIENT PHYSICAL THERAPY LOWER EXTREMITY EVALUATION     Patient Name: [name] [name] MRN: [id number] DOB:Nov 29, 1940, 81 y.o., female Today's Date: 07/19/2021           Past Medical History:  Diagnosis Date   Acute non-recurrent pansinusitis 04/20/2021   Acute respiratory failure with hypoxia ([place]) 03/10/2021   Allergy     Anxiety     Arthritis     At risk for allergic reaction to medication 02/04/2021   Blood transfusion without reported diagnosis     Cancer ([place])      Melanoma on Great toe   Candida infection 04/20/2021   Carotid artery plaque, 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   Cataract     Closed fracture of upper end of humerus 11/03/2017   COPD (chronic obstructive pulmonary disease) ([place])     COVID-19 vaccine series started 07/01/2020   COVID-19 virus infection 03/09/2021   Depression     Encounter for Medicare annual wellness exam 07/01/2020   Encounter to establish care 06/03/2020   Essential hypertension 05/19/2019   Essential hypertension 05/19/2019   Female stress incontinence 02/14/2018   GERD (gastroesophageal reflux disease)     History of opioid abuse ([place]) 05/19/2019   Hospital discharge follow-up 03/24/2021   Hyperglycemia 05/19/2019   Hyperlipidemia     Hypertension     Irritation of right eye 12/09/2020   Menopause present 02/11/2018   Need for shingles vaccine 07/01/2020   Osteoporosis     Prediabetes 05/22/2019   Sleep apnea     Subacute cough 03/24/2021   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         Past Surgical History:  Procedure Laterality Date   [place] Right      Great Toe   REPLACEMENT TOTAL KNEE Bilateral 2000   sacral neuromodulation            Patient Active Problem List    Diagnosis Date Noted   Gait instability 07/05/2021   PAD (peripheral artery disease) ([place]) 05/10/2021   Lymphadenopathy, inguinal 05/05/2021   Type 2 diabetes mellitus with  hyperglycemia, without long-term current use of insulin ([place]) 03/24/2021   Hypertension associated with diabetes ([place]) 02/04/2021   Lactose intolerance 11/11/2020   Pulmonary nodules 10/21/2020   Neuropathy 07/01/2020   Encounter for medication management 06/03/2020   Coronary artery disease involving native heart without angina pectoris 12/18/2019   History of malignant melanoma 12/18/2019   Basal cell carcinoma of skin 12/18/2019   History of surgery for cerebral aneurysm 12/18/2019   Hyperlipidemia associated with type 2 diabetes mellitus ([place]) 05/22/2019   Adjustment disorder with mixed anxiety and depressed mood 05/19/2019   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 05/19/2019   Bilateral carotid artery occlusion 05/19/2019   Anemia 05/19/2019   Bipolar disorder ([place]) 05/19/2019   Chronic obstructive lung disease ([place]) 05/19/2019   Chronic pain syndrome 05/19/2019   Gastroesophageal reflux disease without esophagitis 05/19/2019   Generalized anxiety disorder 05/19/2019   Hypertensive heart disease without congestive heart failure 05/19/2019   Obstructive sleep apnea syndrome 05/19/2019   Polyp of colon 05/19/2019   Depression, major, single episode, moderate ([place]) 05/19/2019   Spinal stenosis in cervical region 05/19/2019   History of TIA (transient ischemic attack) 05/19/2019   Mixed urinary incontinence due to  female genital prolapse 05/19/2019   Tricuspid valve disorders, 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   Panic disorder with agoraphobia 02/22/2018   Diverticulosis of large intestine 02/14/2018   Localized, primary osteoarthritis of shoulder region 02/14/2018   Mitral valve regurgitation 02/14/2018   Grade I internal hemorrhoids 02/14/2018   Bilateral carpal tunnel syndrome 02/11/2018   Morbid obesity ([place]) 02/11/2018   Nondependent opioid abuse in remission ([place]) 02/11/2018       PCP: Orma Render, NP   REFERRING PROVIDER: Orma Render, NP   REFERRING DIAG:  R26.81 (ICD-10-CM) - Gait instability  Z74.1 (ICD-10-CM) - Dependent for walking      THERAPY DIAG:  Other abnormalities of gait and mobility   Pain in right hip   Rationale for Evaluation and Treatment Rehabilitation   ONSET DATE: Jan 2023   SUBJECTIVE:    SUBJECTIVE STATEMENT: Pt says her hip is feeling slightly improved, she is having more pain in her low back today. She has been able to sleep better. Her c/c is stiffness and discomfort in standing - she wants to work on strength and core to improve.   PERTINENT HISTORY: Bil femur fractures, T2DM, CAD, cancer (melanoma) and subsequent R great toe amputation, anxiety   PAIN:  Are you having pain? Yes: NPRS scale: 2/10 Pain location: R posterolateral hip Pain description: pinching, pulling muscle pain Aggravating factors: Sitting, standing>59mn, walking>148m  Relieving factors: Ice, self-massage, stretching   PRECAUTIONS: None   WEIGHT BEARING RESTRICTIONS No   FALLS:  Has patient fallen in last 6 months? No   LIVING ENVIRONMENT: Lives with: lives in an assisted living facility Lives in: House/apartment Stairs: No Has following equipment at home: [employer] 4 wheeled   OCCUPATION: Retired PTA   PLOF: Independent   PATIENT GOALS: Wants to improve walking/fitness and be able to visit her sister in [place] October.     OBJECTIVE:    DIAGNOSTIC FINDINGS:  None   PATIENT SURVEYS:  FOTO 44                        Expected 5546n 11 visits   COGNITION:           Overall cognitive status: Within functional limits for tasks assessed                          SENSATION: Impaired - pt reports decreased sensation in bil feet from T2DM neuropathy     POSTURE: rounded shoulders, forward head, and flexed trunk    PALPATION: Tender to palpation of glute med/min, piriformis, IT band   LOWER EXTREMITY ROM:   Active ROM Right eval Left eval  Hip flexion Limited, concordant pain  Limited  Hip extension      Hip abduction      Hip adduction      Hip internal rotation Painful    Hip external rotation Painful    Knee flexion      Knee extension Painful    Ankle dorsiflexion Limited Limited  Ankle plantarflexion      Ankle inversion      Ankle eversion       (Blank rows = not tested)   LOWER EXTREMITY MMT:   MMT Right eval Left eval  Hip flexion 26.2 36.1  Hip extension      Hip abduction 21 27.1 not able to maintain hold   Hip adduction      Hip internal rotation  Hip external rotation      Knee flexion      Knee extension 23 16.5  Ankle dorsiflexion      Ankle plantarflexion      Ankle inversion      Ankle eversion       (Blank rows = not tested)       FUNCTIONAL TESTS:  Timed up and go (TUG): 21sec -Pt required use of UE to get out of chair     TODAY'S TREATMENT:  7/11 Manual LTR 2x10 SLR 2x10 Pelvic tilt Bridging with lateral shift    Manual - STM and TrP release to R glute med/max Education on theracane use for TrP release Seated hip abd - 2x10, red band Seated marches - 2x10, 2.5lb Reviewed HEP and stretching       PATIENT EDUCATION:  Education details: HEP, exercise, safety with transfers Person educated: Patient Education method: Explanation Education comprehension: verbalized understanding     HOME EXERCISE PROGRAM: -Gentle stretching           -Knee-to-chest           -Piriformis stretch -Self-release of trigger points   ASSESSMENT:   CLINICAL IMPRESSION:   Pt has made improvement since evaluation. Her pain is decreased overall, she does complain of some stiffness in her back with standing. She had multiple trigger points in R glute med/max that she reports felt much improved following skilled STM and release. Patient was given education on theracane use for self-massage at home. She tolerated exercises well; was also given cues for posture and core stability to improve standing mechanics. She will continue  to benefit from skilled therapy to address impairments and guide pt to desired level of function.     OBJECTIVE IMPAIRMENTS decreased balance, decreased endurance, difficulty walking, decreased ROM, and decreased strength.    ACTIVITY LIMITATIONS bending, standing, stairs, transfers, and bed mobility   PARTICIPATION LIMITATIONS: shopping and community activity   PERSONAL FACTORS Age, Fitness, Time since onset of injury/illness/exacerbation, and 3+ comorbidities: (see PMH)  are also affecting patient's functional outcome.    REHAB POTENTIAL: Good   CLINICAL DECISION MAKING: Evolving/moderate complexity   EVALUATION COMPLEXITY: Moderate     GOALS: Goals reviewed with patient? Yes   SHORT TERM GOALS: Target date: 08/09/2021  Pt will improve hip flexion and ankle dorsiflexion ROM to [facility] to improve bed mobility and gait mechanics. Baseline: Goal status: INITIAL   2.  Pt will be able to transfer from supine to seated and from sit-standing without pain or assistance. Baseline:  Goal status: INITIAL   3.  Pt will improve bil hip abduction strength by 20%. Baseline:  Goal status: INITIAL     LONG TERM GOALS: Target date: 08/30/2021    Pt will improve TUG score by MCID of 3.4 seconds to demonstrate a reduced risk for falls. Baseline:  Goal status: INITIAL   2.  Pt will be able to stand or ambulate 20 minutes with normalized gait mechanics without pain. Baseline:  Goal status: INITIAL   3.  Pt will be fully independent with exercise program to maintain current level of fitness. Baseline:  Goal status: INITIAL     PLAN: PT FREQUENCY: 1-2x/week   PT DURATION: 6 weeks   PLANNED INTERVENTIONS: Therapeutic exercises, Therapeutic activity, Neuromuscular re-education, Balance training, Gait training, Patient/Family education, Joint mobilization, Stair training, Aquatic Therapy, Dry Needling, Cryotherapy, and Moist heat   PLAN FOR NEXT SESSION: Continue STM as indicated. Lateral  hip and core strengthening, gait  training. Consider aerobic endurance assessment.                  Carney Living, PT 07/19/2021, 12:04 PM   During this treatment session, the therapist was present, participating in and directing the treatment.

## 2021-08-16 NOTE — Therapy (Incomplete)
OUTPATIENT PHYSICAL THERAPY LOWER EXTREMITY EVALUATION     Patient Name: [name] [name] MRN: [id number] DOB:[birth year], 81 y.o., female Today's Date: 08/16/2021           Past Medical History:  Diagnosis Date   Acute non-recurrent pansinusitis 04/20/2021   Acute respiratory failure with hypoxia ([facility]) 03/10/2021   Allergy     Anxiety     Arthritis     At risk for allergic reaction to medication 02/04/2021   Blood transfusion without reported diagnosis     Cancer ([place])      Melanoma on Great toe   Candida infection 04/20/2021   Carotid artery plaque, 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   Cataract     Closed fracture of upper end of humerus 11/03/2017   COPD (chronic obstructive pulmonary disease) ([place])     COVID-19 vaccine series started 07/01/2020   COVID-19 virus infection 03/09/2021   Depression     Encounter for Medicare annual wellness exam 07/01/2020   Encounter to establish care 06/03/2020   Essential hypertension 05/19/2019   Essential hypertension 05/19/2019   Female stress incontinence 02/14/2018   GERD (gastroesophageal reflux disease)     History of opioid abuse ([place]) 05/19/2019   Hospital discharge follow-up 03/24/2021   Hyperglycemia 05/19/2019   Hyperlipidemia     Hypertension     Irritation of right eye 12/09/2020   Menopause present 02/11/2018   Need for shingles vaccine 07/01/2020   Osteoporosis     Prediabetes 05/22/2019   Sleep apnea     Subacute cough 03/24/2021   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         Past Surgical History:  Procedure Laterality Date   [place] Right      Great Toe   REPLACEMENT TOTAL KNEE Bilateral 2000   sacral neuromodulation            Patient Active Problem List    Diagnosis Date Noted   Gait instability 07/05/2021   PAD (peripheral artery disease) ([place]) 05/10/2021   Lymphadenopathy, inguinal 05/05/2021   Type 2 diabetes mellitus with hyperglycemia, without  long-term current use of insulin ([place]) 03/24/2021   Hypertension associated with diabetes ([place]) 02/04/2021   Lactose intolerance 11/11/2020   Pulmonary nodules 10/21/2020   Neuropathy 07/01/2020   Encounter for medication management 06/03/2020   Coronary artery disease involving native heart without angina pectoris 12/18/2019   History of malignant melanoma 12/18/2019   Basal cell carcinoma of skin 12/18/2019   History of surgery for cerebral aneurysm 12/18/2019   Hyperlipidemia associated with type 2 diabetes mellitus ([place]) 05/22/2019   Adjustment disorder with mixed anxiety and depressed mood 05/19/2019   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 05/19/2019   Bilateral carotid artery occlusion 05/19/2019   Anemia 05/19/2019   Bipolar disorder ([place]) 05/19/2019   Chronic obstructive lung disease ([place]) 05/19/2019   Chronic pain syndrome 05/19/2019   Gastroesophageal reflux disease without esophagitis 05/19/2019   Generalized anxiety disorder 05/19/2019   Hypertensive heart disease without congestive heart failure 05/19/2019   Obstructive sleep apnea syndrome 05/19/2019   Polyp of colon 05/19/2019   Depression, major, single episode, moderate ([place]) 05/19/2019   Spinal stenosis in cervical region 05/19/2019   History of TIA (transient ischemic attack) 05/19/2019   Mixed urinary incontinence due to female genital prolapse 05/19/2019   Tricuspid valve disorders, 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   Panic disorder with agoraphobia  02/22/2018   Diverticulosis of large intestine 02/14/2018   Localized, primary osteoarthritis of shoulder region 02/14/2018   Mitral valve regurgitation 02/14/2018   Grade I internal hemorrhoids 02/14/2018   Bilateral carpal tunnel syndrome 02/11/2018   Morbid obesity ([place]) 02/11/2018   Nondependent opioid abuse in remission ([place]) 02/11/2018       PCP: Orma Render, NP   REFERRING PROVIDER: Orma Render, NP    REFERRING DIAG:  R26.81 (ICD-10-CM) - Gait instability  Z74.1 (ICD-10-CM) - Dependent for walking      THERAPY DIAG:  Other abnormalities of gait and mobility   Pain in right hip   Rationale for Evaluation and Treatment Rehabilitation   ONSET DATE: Jan 2023   SUBJECTIVE:    SUBJECTIVE STATEMENT:    Pt says her hip is feeling slightly improved, she is having more pain in her low back today. She has been able to sleep better. Her c/c is stiffness and discomfort in standing - she wants to work on strength and core to improve.   PERTINENT HISTORY: Bil femur fractures, T2DM, CAD, cancer (melanoma) and subsequent R great toe amputation, anxiety   PAIN:  Are you having pain? Yes: NPRS scale: 2/10 Pain location: R posterolateral hip Pain description: pinching, pulling muscle pain Aggravating factors: Sitting, standing>5mn, walking>137m  Relieving factors: Ice, self-massage, stretching   PRECAUTIONS: None   WEIGHT BEARING RESTRICTIONS No   FALLS:  Has patient fallen in last 6 months? No   LIVING ENVIRONMENT: Lives with: lives in an assisted living facility Lives in: House/apartment Stairs: No Has following equipment at home: [employer] 4 wheeled   OCCUPATION: Retired PTA   PLOF: Independent   PATIENT GOALS: Wants to improve walking/fitness and be able to visit her sister in [place] October.     OBJECTIVE:    DIAGNOSTIC FINDINGS:  None   PATIENT SURVEYS:  FOTO 44                        Expected 5562n 11 visits   COGNITION:           Overall cognitive status: Within functional limits for tasks assessed                          SENSATION: Impaired - pt reports decreased sensation in bil feet from T2DM neuropathy     POSTURE: rounded shoulders, forward head, and flexed trunk    PALPATION: Tender to palpation of glute med/min, piriformis, IT band   LOWER EXTREMITY ROM:   Active ROM Right eval Left eval  Hip flexion Limited, concordant pain  Limited  Hip extension      Hip abduction      Hip adduction      Hip internal rotation Painful    Hip external rotation Painful    Knee flexion      Knee extension Painful    Ankle dorsiflexion Limited Limited  Ankle plantarflexion      Ankle inversion      Ankle eversion       (Blank rows = not tested)   LOWER EXTREMITY MMT:   MMT Right eval Left eval  Hip flexion 26.2 36.1  Hip extension      Hip abduction 21 27.1 not able to maintain hold   Hip adduction      Hip internal rotation      Hip external rotation      Knee  flexion      Knee extension 23 16.5  Ankle dorsiflexion      Ankle plantarflexion      Ankle inversion      Ankle eversion       (Blank rows = not tested)       FUNCTIONAL TESTS:  Timed up and go (TUG): 21sec -Pt required use of UE to get out of chair     TODAY'S TREATMENT: Manual - STM and TrP release to R glute med/max Education on theracane use for TrP release Seated hip abd - 2x10, red band Seated marches - 2x10, 2.5lb Reviewed HEP and stretching       PATIENT EDUCATION:  Education details: HEP, exercise, safety with transfers Person educated: Patient Education method: Explanation Education comprehension: verbalized understanding     HOME EXERCISE PROGRAM: -Gentle stretching           -Knee-to-chest           -Piriformis stretch -Self-release of trigger points   ASSESSMENT:   CLINICAL IMPRESSION:   Pt has made improvement since evaluation. Her pain is decreased overall, she does complain of some stiffness in her back with standing. She had multiple trigger points in R glute med/max that she reports felt much improved following skilled STM and release. Patient was given education on theracane use for self-massage at home. She tolerated exercises well; was also given cues for posture and core stability to improve standing mechanics. She will continue to benefit from skilled therapy to address impairments and guide pt to desired  level of function.     OBJECTIVE IMPAIRMENTS decreased balance, decreased endurance, difficulty walking, decreased ROM, and decreased strength.    ACTIVITY LIMITATIONS bending, standing, stairs, transfers, and bed mobility   PARTICIPATION LIMITATIONS: shopping and community activity   PERSONAL FACTORS Age, Fitness, Time since onset of injury/illness/exacerbation, and 3+ comorbidities: (see PMH)  are also affecting patient's functional outcome.    REHAB POTENTIAL: Good   CLINICAL DECISION MAKING: Evolving/moderate complexity   EVALUATION COMPLEXITY: Moderate     GOALS: Goals reviewed with patient? Yes   SHORT TERM GOALS: Target date: 08/09/2021  Pt will improve hip flexion and ankle dorsiflexion ROM to [facility] to improve bed mobility and gait mechanics. Baseline: Goal status: INITIAL   2.  Pt will be able to transfer from supine to seated and from sit-standing without pain or assistance. Baseline:  Goal status: INITIAL   3.  Pt will improve bil hip abduction strength by 20%. Baseline:  Goal status: INITIAL     LONG TERM GOALS: Target date: 08/30/2021    Pt will improve TUG score by MCID of 3.4 seconds to demonstrate a reduced risk for falls. Baseline:  Goal status: INITIAL   2.  Pt will be able to stand or ambulate 20 minutes with normalized gait mechanics without pain. Baseline:  Goal status: INITIAL   3.  Pt will be fully independent with exercise program to maintain current level of fitness. Baseline:  Goal status: INITIAL     PLAN: PT FREQUENCY: 1-2x/week   PT DURATION: 6 weeks   PLANNED INTERVENTIONS: Therapeutic exercises, Therapeutic activity, Neuromuscular re-education, Balance training, Gait training, Patient/Family education, Joint mobilization, Stair training, Aquatic Therapy, Dry Needling, Cryotherapy, and Moist heat   PLAN FOR NEXT SESSION: Continue STM as indicated. Lateral hip and core strengthening, gait training. Consider aerobic endurance  assessment.     Lenell Antu, Student-PT 07/19/2021, 2:48 PM  Carney Living, PT 07/19/2021, 12:04 PM      Carney Living, PT 07/19/2021, 12:04 PM   During this treatment session, the therapist was present, participating in and directing the treatment.

## 2021-08-17 ENCOUNTER — Encounter (HOSPITAL_BASED_OUTPATIENT_CLINIC_OR_DEPARTMENT_OTHER): Payer: Self-pay | Admitting: Physical Therapy

## 2021-08-17 NOTE — Therapy (Signed)
Therapist)               PT End of Session - 08/17/21 0812     Visit Number 3    Number of Visits 13    Date for PT Re-Evaluation 08/30/21    PT Start Time 5397    PT Stop Time 1513    PT Time Calculation (min) 42 min    Activity Tolerance Patient tolerated treatment well    Behavior During Therapy [facility] for tasks assessed/performed                OUTPATIENT PHYSICAL THERAPY LOWER EXTREMITY Treatment      Patient Name: [name] [name] MRN: [id number] DOB:1941-01-18, 81 y.o., female Today's Date: 07/19/2021           Past Medical History:  Diagnosis Date   Acute non-recurrent pansinusitis 04/20/2021   Acute respiratory failure with hypoxia ([place]) 03/10/2021   Allergy     Anxiety     Arthritis     At risk for allergic reaction to medication 02/04/2021   Blood transfusion without reported diagnosis     Cancer ([place])      Melanoma on Great toe   Candida infection 04/20/2021   Carotid artery plaque, 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   Cataract     Closed fracture of upper end of humerus 11/03/2017   COPD (chronic obstructive pulmonary disease) ([place])     COVID-19 vaccine series started 07/01/2020   COVID-19 virus infection 03/09/2021   Depression     Encounter for Medicare annual wellness exam 07/01/2020   Encounter to establish care 06/03/2020   Essential hypertension 05/19/2019   Essential hypertension 05/19/2019   Female stress incontinence 02/14/2018   GERD (gastroesophageal reflux disease)     History of opioid abuse ([place]) 05/19/2019   Hospital discharge follow-up 03/24/2021   Hyperglycemia 05/19/2019   Hyperlipidemia     Hypertension     Irritation of right eye 12/09/2020   Menopause present 02/11/2018   Need for shingles vaccine 07/01/2020   Osteoporosis     Prediabetes 05/22/2019   Sleep apnea     Subacute cough 03/24/2021   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         Past Surgical History:  Procedure Laterality Date   [place] Right      Great Toe   REPLACEMENT TOTAL KNEE Bilateral 2000   sacral neuromodulation            Patient Active Problem List    Diagnosis Date Noted   Gait instability 07/05/2021   PAD (peripheral artery disease) ([place]) 05/10/2021   Lymphadenopathy, inguinal 05/05/2021   Type 2 diabetes mellitus with hyperglycemia, without long-term current use of insulin ([place]) 03/24/2021   Hypertension associated with diabetes ([place]) 02/04/2021   Lactose intolerance 11/11/2020   Pulmonary nodules 10/21/2020   Neuropathy 07/01/2020   Encounter for medication management 06/03/2020   Coronary artery disease involving native heart without angina pectoris 12/18/2019   History of malignant melanoma 12/18/2019   Basal cell carcinoma of skin 12/18/2019   History of surgery for cerebral aneurysm 12/18/2019   Hyperlipidemia associated with type 2 diabetes mellitus ([place]) 05/22/2019   Adjustment disorder with mixed anxiety and depressed mood 05/19/2019   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 05/19/2019   Bilateral carotid artery occlusion 05/19/2019   Anemia 05/19/2019   Bipolar disorder ([place]) 05/19/2019  Chronic obstructive lung disease ([place]) 05/19/2019   Chronic pain syndrome 05/19/2019   Gastroesophageal reflux disease without esophagitis 05/19/2019   Generalized anxiety disorder 05/19/2019   Hypertensive heart disease without congestive heart failure 05/19/2019   Obstructive sleep apnea syndrome 05/19/2019   Polyp of colon 05/19/2019   Depression, major, single episode, moderate ([place]) 05/19/2019   Spinal stenosis in cervical region 05/19/2019   History of TIA (transient ischemic attack) 05/19/2019   Mixed urinary incontinence due to female genital prolapse 05/19/2019   Tricuspid valve disorders, 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   Panic disorder with agoraphobia 02/22/2018   Diverticulosis of large intestine 02/14/2018   Localized, primary  osteoarthritis of shoulder region 02/14/2018   Mitral valve regurgitation 02/14/2018   Grade I internal hemorrhoids 02/14/2018   Bilateral carpal tunnel syndrome 02/11/2018   Morbid obesity ([place]) 02/11/2018   Nondependent opioid abuse in remission ([place]) 02/11/2018      PCP: Orma Render, NP   REFERRING PROVIDER: Orma Render, NP   REFERRING DIAG:  R26.81 (ICD-10-CM) - Gait instability  Z74.1 (ICD-10-CM) - Dependent for walking      THERAPY DIAG:  Other abnormalities of gait and mobility   Pain in right hip   Rationale for Evaluation and Treatment Rehabilitation   ONSET DATE: Jan 2023   SUBJECTIVE:    SUBJECTIVE STATEMENT: Patient reports her hip and low back are much improved. She has been doing self-massage and stretching regularly. She has been walking more but has concerns of pain on the bottom of her R foot today.     PERTINENT HISTORY: Bil femur fractures, T2DM, CAD, cancer (melanoma) and subsequent R great toe amputation, anxiety   PAIN:  Are you having pain? Yes: NPRS scale: 2/10 Pain location: R posterolateral hip Pain description: pinching, pulling muscle pain Aggravating factors: Sitting, standing>42mn, walking>116m  Relieving factors: Ice, self-massage, stretching   PRECAUTIONS: None   WEIGHT BEARING RESTRICTIONS No   FALLS:  Has patient fallen in last 6 months? No   LIVING ENVIRONMENT: Lives with: lives in an assisted living facility Lives in: House/apartment Stairs: No Has following equipment at home: [employer] 4 wheeled   OCCUPATION: Retired PTA   PLOF: Independent   PATIENT GOALS: Wants to improve walking/fitness and be able to visit her sister in [place] October.     OBJECTIVE:    DIAGNOSTIC FINDINGS:  None   PATIENT SURVEYS:  FOTO 44                        Expected 5565n 11 visits   COGNITION:           Overall cognitive status: Within functional limits for tasks assessed                          SENSATION: Impaired -  pt reports decreased sensation in bil feet from T2DM neuropathy     POSTURE: rounded shoulders, forward head, and flexed trunk    PALPATION: Tender to palpation of glute med/min, piriformis, IT band   LOWER EXTREMITY ROM:   Active ROM Right eval Left eval  Hip flexion Limited, concordant pain Limited  Hip extension      Hip abduction      Hip adduction      Hip internal rotation Painful    Hip external rotation Painful    Knee flexion      Knee extension Painful    Ankle dorsiflexion Limited  Limited  Ankle plantarflexion      Ankle inversion      Ankle eversion       (Blank rows = not tested)   LOWER EXTREMITY MMT:   MMT Right eval Left eval  Hip flexion 26.2 36.1  Hip extension      Hip abduction 21 27.1 not able to maintain hold   Hip adduction      Hip internal rotation      Hip external rotation      Knee flexion      Knee extension 23 16.5  Ankle dorsiflexion      Ankle plantarflexion      Ankle inversion      Ankle eversion       (Blank rows = not tested)       FUNCTIONAL TESTS:  Timed up and go (TUG): 21sec -Pt required use of UE to get out of chair     TODAY'S TREATMENT:   7/11 Manual - STM and TrP release of the R glute med/TFL, STM of plantar fascia w/ grade III/IV first ray mobs LTR 2x10 SLR 2x10 Pelvic tilt x15 Bridging with lateral shift x8     6/29 Manual - STM and TrP release to R glute med/max Education on theracane use for TrP release Seated hip abd - 2x10, red band Seated marches - 2x10, 2.5lb Reviewed HEP and stretching       PATIENT EDUCATION:  Education details: HEP, exercise, safety with transfers Person educated: Patient Education method: Explanation Education comprehension: verbalized understanding     HOME EXERCISE PROGRAM: -Gentle stretching           -Knee-to-chest           -Piriformis stretch -Self-release of trigger points   ASSESSMENT:   CLINICAL IMPRESSION:   Patient continues to make good  improvement. She has been more mobile and active by increasing her walking - which is likely main contributing factor to irritation of plantar fascia. She responded well to manual and reported a decrease in stiffness following. She tolerates exercises well. She wants to continue to improve bed mobility - we worked on bridging and discussed proper shifting techniques in bed. Pt was also provided education on gradually building up walking/fitness program. She will continue to benefit from skilled therapy to improve function and fitness.   OBJECTIVE IMPAIRMENTS decreased balance, decreased endurance, difficulty walking, decreased ROM, and decreased strength.    ACTIVITY LIMITATIONS bending, standing, stairs, transfers, and bed mobility   PARTICIPATION LIMITATIONS: shopping and community activity   PERSONAL FACTORS Age, Fitness, Time since onset of injury/illness/exacerbation, and 3+ comorbidities: (see PMH)  are also affecting patient's functional outcome.    REHAB POTENTIAL: Good   CLINICAL DECISION MAKING: Evolving/moderate complexity   EVALUATION COMPLEXITY: Moderate     GOALS: Goals reviewed with patient? Yes   SHORT TERM GOALS: Target date: 08/09/2021  Pt will improve hip flexion and ankle dorsiflexion ROM to [facility] to improve bed mobility and gait mechanics. Baseline: Goal status: INITIAL   2.  Pt will be able to transfer from supine to seated and from sit-standing without pain or assistance. Baseline:  Goal status: INITIAL   3.  Pt will improve bil hip abduction strength by 20%. Baseline:  Goal status: INITIAL     LONG TERM GOALS: Target date: 08/30/2021    Pt will improve TUG score by MCID of 3.4 seconds to demonstrate a reduced risk for falls. Baseline:  Goal status: INITIAL   2.  Pt will be able to stand or ambulate 20 minutes with normalized gait mechanics without pain. Baseline:  Goal status: INITIAL   3.  Pt will be fully independent with exercise program to maintain  current level of fitness. Baseline:  Goal status: INITIAL     PLAN: PT FREQUENCY: 1-2x/week   PT DURATION: 6 weeks   PLANNED INTERVENTIONS: Therapeutic exercises, Therapeutic activity, Neuromuscular re-education, Balance training, Gait training, Patient/Family education, Joint mobilization, Stair training, Aquatic Therapy, Dry Needling, Cryotherapy, and Moist heat   PLAN FOR NEXT SESSION: Continue STM as indicated. Lateral hip, core, ankle strengthening, gait training. Consider aerobic endurance assessment.                   Carolyne Littles PT DPT  08/17/2021  Jolene Schimke SPAT  08/17/2021  During this treatment session, the therapist was present, participating in and directing the treatment.

## 2021-08-18 ENCOUNTER — Ambulatory Visit (HOSPITAL_BASED_OUTPATIENT_CLINIC_OR_DEPARTMENT_OTHER): Payer: Medicare HMO | Admitting: Physical Therapy

## 2021-08-18 ENCOUNTER — Other Ambulatory Visit (HOSPITAL_BASED_OUTPATIENT_CLINIC_OR_DEPARTMENT_OTHER): Payer: Self-pay | Admitting: Nurse Practitioner

## 2021-08-18 DIAGNOSIS — M25551 Pain in right hip: Secondary | ICD-10-CM

## 2021-08-18 DIAGNOSIS — R2689 Other abnormalities of gait and mobility: Secondary | ICD-10-CM | POA: Diagnosis not present

## 2021-08-18 DIAGNOSIS — K219 Gastro-esophageal reflux disease without esophagitis: Secondary | ICD-10-CM

## 2021-08-18 DIAGNOSIS — D649 Anemia, unspecified: Secondary | ICD-10-CM

## 2021-08-18 NOTE — Therapy (Signed)
Therapist)                    OUTPATIENT PHYSICAL THERAPY LOWER EXTREMITY Treatment      Patient Name: [name] [name] MRN: [id number] DOB:February 09, 1940, 81 y.o., female Today's Date: 07/19/2021           Past Medical History:  Diagnosis Date   Acute non-recurrent pansinusitis 04/20/2021   Acute respiratory failure with hypoxia ([place]) 03/10/2021   Allergy     Anxiety     Arthritis     At risk for allergic reaction to medication 02/04/2021   Blood transfusion without reported diagnosis     Cancer ([place])      Melanoma on Great toe   Candida infection 04/20/2021   Carotid artery plaque, 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   Cataract     Closed fracture of upper end of humerus 11/03/2017   COPD (chronic obstructive pulmonary disease) ([place])     COVID-19 vaccine series started 07/01/2020   COVID-19 virus infection 03/09/2021   Depression     Encounter for Medicare annual wellness exam 07/01/2020   Encounter to establish care 06/03/2020   Essential hypertension 05/19/2019   Essential hypertension 05/19/2019   Female stress incontinence 02/14/2018   GERD (gastroesophageal reflux disease)     History of opioid abuse ([place]) 05/19/2019   Hospital discharge follow-up 03/24/2021   Hyperglycemia 05/19/2019   Hyperlipidemia     Hypertension     Irritation of right eye 12/09/2020   Menopause present 02/11/2018   Need for shingles vaccine 07/01/2020   Osteoporosis     Prediabetes 05/22/2019   Sleep apnea     Subacute cough 03/24/2021   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         Past Surgical History:  Procedure Laterality Date   [place] Right      Great Toe   REPLACEMENT TOTAL KNEE Bilateral 2000   sacral neuromodulation            Patient Active Problem List    Diagnosis Date Noted   Gait instability 07/05/2021   PAD (peripheral artery disease) ([place]) 05/10/2021   Lymphadenopathy, inguinal 05/05/2021   Type 2 diabetes mellitus with  hyperglycemia, without long-term current use of insulin ([place]) 03/24/2021   Hypertension associated with diabetes ([place]) 02/04/2021   Lactose intolerance 11/11/2020   Pulmonary nodules 10/21/2020   Neuropathy 07/01/2020   Encounter for medication management 06/03/2020   Coronary artery disease involving native heart without angina pectoris 12/18/2019   History of malignant melanoma 12/18/2019   Basal cell carcinoma of skin 12/18/2019   History of surgery for cerebral aneurysm 12/18/2019   Hyperlipidemia associated with type 2 diabetes mellitus ([place]) 05/22/2019   Adjustment disorder with mixed anxiety and depressed mood 05/19/2019   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 05/19/2019   Bilateral carotid artery occlusion 05/19/2019   Anemia 05/19/2019   Bipolar disorder ([place]) 05/19/2019   Chronic obstructive lung disease ([place]) 05/19/2019   Chronic pain syndrome 05/19/2019   Gastroesophageal reflux disease without esophagitis 05/19/2019   Generalized anxiety disorder 05/19/2019   Hypertensive heart disease without congestive heart failure 05/19/2019   Obstructive sleep apnea syndrome 05/19/2019   Polyp of colon 05/19/2019   Depression, major, single episode, moderate ([place]) 05/19/2019   Spinal stenosis in cervical region 05/19/2019   History of TIA (transient ischemic attack) 05/19/2019   Mixed urinary incontinence due  to female genital prolapse 05/19/2019   Tricuspid valve disorders, 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   Panic disorder with agoraphobia 02/22/2018   Diverticulosis of large intestine 02/14/2018   Localized, primary osteoarthritis of shoulder region 02/14/2018   Mitral valve regurgitation 02/14/2018   Grade I internal hemorrhoids 02/14/2018   Bilateral carpal tunnel syndrome 02/11/2018   Morbid obesity ([place]) 02/11/2018   Nondependent opioid abuse in remission ([place]) 02/11/2018      PCP: Orma Render, NP   REFERRING PROVIDER: Orma Render, NP   REFERRING DIAG:  R26.81 (ICD-10-CM) - Gait instability  Z74.1 (ICD-10-CM) - Dependent for walking      THERAPY DIAG:  Other abnormalities of gait and mobility   Pain in right hip   Rationale for Evaluation and Treatment Rehabilitation   ONSET DATE: Jan 2023   SUBJECTIVE:    SUBJECTIVE STATEMENT: Patient reports her hip and low back are much improved. She has been doing self-massage and stretching regularly. She has been walking more but has concerns of pain on the bottom of her R foot today.     PERTINENT HISTORY: Bil femur fractures, T2DM, CAD, cancer (melanoma) and subsequent R great toe amputation, anxiety   PAIN:  Are you having pain? Yes: NPRS scale: 2/10 Pain location: R posterolateral hip Pain description: pinching, pulling muscle pain Aggravating factors: Sitting, standing>35mn, walking>126m  Relieving factors: Ice, self-massage, stretching   PRECAUTIONS: None   WEIGHT BEARING RESTRICTIONS No   FALLS:  Has patient fallen in last 6 months? No   LIVING ENVIRONMENT: Lives with: lives in an assisted living facility Lives in: House/apartment Stairs: No Has following equipment at home: [employer] 4 wheeled   OCCUPATION: Retired PTA   PLOF: Independent   PATIENT GOALS: Wants to improve walking/fitness and be able to visit her sister in [place] October.     OBJECTIVE:    DIAGNOSTIC FINDINGS:  None   PATIENT SURVEYS:  FOTO 44                        Expected 5589n 11 visits   COGNITION:           Overall cognitive status: Within functional limits for tasks assessed                          SENSATION: Impaired - pt reports decreased sensation in bil feet from T2DM neuropathy     POSTURE: rounded shoulders, forward head, and flexed trunk    PALPATION: Tender to palpation of glute med/min, piriformis, IT band   LOWER EXTREMITY ROM:   Active ROM Right eval Left eval  Hip flexion Limited, concordant pain Limited  Hip extension       Hip abduction      Hip adduction      Hip internal rotation Painful    Hip external rotation Painful    Knee flexion      Knee extension Painful    Ankle dorsiflexion Limited Limited  Ankle plantarflexion      Ankle inversion      Ankle eversion       (Blank rows = not tested)   LOWER EXTREMITY MMT:   MMT Right eval Left eval  Hip flexion 26.2 36.1  Hip extension      Hip abduction 21 27.1 not able to maintain hold   Hip adduction      Hip internal rotation      Hip  external rotation      Knee flexion      Knee extension 23 16.5  Ankle dorsiflexion      Ankle plantarflexion      Ankle inversion      Ankle eversion       (Blank rows = not tested)       FUNCTIONAL TESTS:  Timed up and go (TUG): 21sec -Pt required use of UE to get out of chair     TODAY'S TREATMENT:   7/13 Warm-up: NuStep, L1 81mn LAQ - 3x10, 2x5lb Mini squats to table - 3x8 Balance exercises - 2x30sec each -Marching with UE support for stability -Lateral weight shifts -Semi-tandem weight shifts -Narrow base, 1x eyes closed   Reviewed use of Morton's Met pad to reduce metatarsal force on her foot. Her foot is likely effecting her ability to ambulate.   7/11 Manual - STM and TrP release of the R glute med/TFL, STM of plantar fascia w/ grade III/IV first ray mobs LTR 2x10 SLR 2x10 Pelvic tilt x15 Bridging with lateral shift x8     6/29 Manual - STM and TrP release to R glute med/max Education on theracane use for TrP release Seated hip abd - 2x10, red band Seated marches - 2x10, 2.5lb Reviewed HEP and stretching       PATIENT EDUCATION:  Education details: HEP, exercise, safety with transfers Person educated: Patient Education method: Explanation Education comprehension: verbalized understanding     HOME EXERCISE PROGRAM: -Gentle stretching           -Knee-to-chest           -Piriformis stretch -Self-release of trigger points   ASSESSMENT:   CLINICAL IMPRESSION:    The patient continues to progress well. When she doesn't have UE support she will have some pain down into the right hip. Therapy worked on weight shifting and standing exercises with her. She also performed sit to stand transfers from an elevated surface. She tolerates balance exercises well - displays more lateral shifting when not using UE support. She did better as she practiced more. We will continue to work on functional strengthening as well as intervention to reduce her pain.    OBJECTIVE IMPAIRMENTS decreased balance, decreased endurance, difficulty walking, decreased ROM, and decreased strength.    ACTIVITY LIMITATIONS bending, standing, stairs, transfers, and bed mobility   PARTICIPATION LIMITATIONS: shopping and community activity   PERSONAL FACTORS Age, Fitness, Time since onset of injury/illness/exacerbation, and 3+ comorbidities: (see PMH)  are also affecting patient's functional outcome.    REHAB POTENTIAL: Good   CLINICAL DECISION MAKING: Evolving/moderate complexity   EVALUATION COMPLEXITY: Moderate     GOALS: Goals reviewed with patient? Yes   SHORT TERM GOALS: Target date: 08/09/2021  Pt [facility]. Baseline: Goal status: INITIAL   2.  Pt will be able to transfer from supine to seated and from sit-standing without pain or assistance. Baseline:  Goal status: INITIAL   3.  Pt will improve bil hip abduction strength by 20%. Baseline:  Goal status: INITIAL     LONG TERM GOALS: Target date: 08/30/2021    Pt will improve TUG score by MCID of 3.4 seconds to demonstrate a reduced risk for falls. Baseline:  Goal status: INITIAL   2.  Pt will be able to stand or ambulate 20 minutes with normalized gait mechanics without pain. Baseline:  Goal status: INITIAL   3.  Pt will be fully independent with exercise program to maintain current level of fitness. Baseline:  Goal  status: INITIAL     PLAN: PT FREQUENCY: 1-2x/week   PT DURATION: 6 weeks   PLANNED INTERVENTIONS: Therapeutic exercises, Therapeutic activity, Neuromuscular re-education, Balance training, Gait training, Patient/Family education, Joint mobilization, Stair training, Aquatic Therapy, Dry Needling, Cryotherapy, and Moist heat   PLAN FOR NEXT SESSION: Continue STM as indicated. Lateral hip, core, ankle strengthening, gait training. Consider aerobic endurance assessment.                   Carolyne Littles PT DPT  08/17/2021  Jolene Schimke Student-PT 08/17/2021  During this treatment session, the therapist was present, participating in and directing the treatment.

## 2021-08-19 ENCOUNTER — Encounter (HOSPITAL_BASED_OUTPATIENT_CLINIC_OR_DEPARTMENT_OTHER): Payer: Self-pay | Admitting: Physical Therapy

## 2021-08-22 ENCOUNTER — Ambulatory Visit (HOSPITAL_BASED_OUTPATIENT_CLINIC_OR_DEPARTMENT_OTHER)
Admission: RE | Admit: 2021-08-22 | Discharge: 2021-08-22 | Disposition: A | Discharge status: Home / Self Care | Payer: Medicare HMO | Source: Ambulatory Visit | Attending: Pulmonary Disease | Admitting: Pulmonary Disease

## 2021-08-22 DIAGNOSIS — R911 Solitary pulmonary nodule: Secondary | ICD-10-CM | POA: Insufficient documentation

## 2021-08-23 ENCOUNTER — Ambulatory Visit (HOSPITAL_BASED_OUTPATIENT_CLINIC_OR_DEPARTMENT_OTHER): Payer: Medicare HMO | Admitting: Physical Therapy

## 2021-08-23 DIAGNOSIS — R2689 Other abnormalities of gait and mobility: Secondary | ICD-10-CM

## 2021-08-23 DIAGNOSIS — M25551 Pain in right hip: Secondary | ICD-10-CM

## 2021-08-23 NOTE — Therapy (Signed)
Therapist)                    OUTPATIENT PHYSICAL THERAPY LOWER EXTREMITY Treatment      Patient Name: [name] [name] MRN: [id number] DOB:1941-02-05, 81 y.o., female Today's Date: 07/19/2021           Past Medical History:  Diagnosis Date   Acute non-recurrent pansinusitis 04/20/2021   Acute respiratory failure with hypoxia ([place]) 03/10/2021   Allergy     Anxiety     Arthritis     At risk for allergic reaction to medication 02/04/2021   Blood transfusion without reported diagnosis     Cancer ([place])      Melanoma on Great toe   Candida infection 04/20/2021   Carotid artery plaque, 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   Cataract     Closed fracture of upper end of humerus 11/03/2017   COPD (chronic obstructive pulmonary disease) ([place])     COVID-19 vaccine series started 07/01/2020   COVID-19 virus infection 03/09/2021   Depression     Encounter for Medicare annual wellness exam 07/01/2020   Encounter to establish care 06/03/2020   Essential hypertension 05/19/2019   Essential hypertension 05/19/2019   Female stress incontinence 02/14/2018   GERD (gastroesophageal reflux disease)     History of opioid abuse ([place]) 05/19/2019   Hospital discharge follow-up 03/24/2021   Hyperglycemia 05/19/2019   Hyperlipidemia     Hypertension     Irritation of right eye 12/09/2020   Menopause present 02/11/2018   Need for shingles vaccine 07/01/2020   Osteoporosis     Prediabetes 05/22/2019   Sleep apnea     Subacute cough 03/24/2021   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         Past Surgical History:  Procedure Laterality Date   [place] Right      Great Toe   REPLACEMENT TOTAL KNEE Bilateral 2000   sacral neuromodulation            Patient Active Problem List    Diagnosis Date Noted   Gait instability 07/05/2021   PAD (peripheral artery disease) ([place]) 05/10/2021   Lymphadenopathy, inguinal 05/05/2021   Type 2 diabetes mellitus with  hyperglycemia, without long-term current use of insulin ([place]) 03/24/2021   Hypertension associated with diabetes ([place]) 02/04/2021   Lactose intolerance 11/11/2020   Pulmonary nodules 10/21/2020   Neuropathy 07/01/2020   Encounter for medication management 06/03/2020   Coronary artery disease involving native heart without angina pectoris 12/18/2019   History of malignant melanoma 12/18/2019   Basal cell carcinoma of skin 12/18/2019   History of surgery for cerebral aneurysm 12/18/2019   Hyperlipidemia associated with type 2 diabetes mellitus ([place]) 05/22/2019   Adjustment disorder with mixed anxiety and depressed mood 05/19/2019   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 05/19/2019   Bilateral carotid artery occlusion 05/19/2019   Anemia 05/19/2019   Bipolar disorder ([place]) 05/19/2019   Chronic obstructive lung disease ([place]) 05/19/2019   Chronic pain syndrome 05/19/2019   Gastroesophageal reflux disease without esophagitis 05/19/2019   Generalized anxiety disorder 05/19/2019   Hypertensive heart disease without congestive heart failure 05/19/2019   Obstructive sleep apnea syndrome 05/19/2019   Polyp of colon 05/19/2019   Depression, major, single episode, moderate ([place]) 05/19/2019   Spinal stenosis in cervical region 05/19/2019   History of TIA (transient ischemic attack) 05/19/2019   Mixed urinary incontinence due  to female genital prolapse 05/19/2019   Tricuspid valve disorders, 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   Panic disorder with agoraphobia 02/22/2018   Diverticulosis of large intestine 02/14/2018   Localized, primary osteoarthritis of shoulder region 02/14/2018   Mitral valve regurgitation 02/14/2018   Grade I internal hemorrhoids 02/14/2018   Bilateral carpal tunnel syndrome 02/11/2018   Morbid obesity ([place]) 02/11/2018   Nondependent opioid abuse in remission ([place]) 02/11/2018      PCP: Orma Render, NP   REFERRING PROVIDER: Orma Render, NP   REFERRING DIAG:  R26.81 (ICD-10-CM) - Gait instability  Z74.1 (ICD-10-CM) - Dependent for walking      THERAPY DIAG:  Other abnormalities of gait and mobility   Pain in right hip   Rationale for Evaluation and Treatment Rehabilitation   ONSET DATE: Jan 2023   SUBJECTIVE:    SUBJECTIVE STATEMENT: Patient reports her hip and low back are much improved. She has been doing self-massage and stretching regularly. She has been walking more but has concerns of pain on the bottom of her R foot today.     PERTINENT HISTORY: Bil femur fractures, T2DM, CAD, cancer (melanoma) and subsequent R great toe amputation, anxiety   PAIN:  Are you having pain? Yes: NPRS scale: 2/10 Pain location: R posterolateral hip Pain description: pinching, pulling muscle pain Aggravating factors: Sitting, standing>66mn, walking>112m  Relieving factors: Ice, self-massage, stretching   PRECAUTIONS: None   WEIGHT BEARING RESTRICTIONS No   FALLS:  Has patient fallen in last 6 months? No   LIVING ENVIRONMENT: Lives with: lives in an assisted living facility Lives in: House/apartment Stairs: No Has following equipment at home: [place] [employer] 4 wheeled   OCCUPATION: Retired PTA   PLOF: Independent   PATIENT GOALS: Wants to improve walking/fitness and be able to visit her sister in [place].     OBJECTIVE:    DIAGNOSTIC FINDINGS:  None   PATIENT SURVEYS:  FOTO 44                        Expected [id number] 11 visits   COGNITION:           Overall cognitive status: Within functional limits for tasks assessed                          SENSATION: Impaired - pt reports decreased sensation in bil feet from T2DM neuropathy     POSTURE: rounded shoulders, forward head, and flexed trunk    PALPATION: Tender to palpation of glute med/min, piriformis, IT band   LOWER EXTREMITY ROM:   Active ROM Right eval Left eval  Hip flexion Limited, concordant pain Limited  Hip extension       Hip abduction      Hip adduction      Hip internal rotation Painful    Hip external rotation Painful    Knee flexion      Knee extension Painful    Ankle dorsiflexion Limited Limited  Ankle plantarflexion      Ankle inversion      Ankle eversion       (Blank rows = not tested)   LOWER EXTREMITY MMT:   MMT Right eval Left eval  Hip flexion 26.2 36.1  Hip extension      Hip abduction 21 27.1 not able to maintain hold   Hip adduction      Hip internal rotation      Hip  external rotation      Knee flexion      Knee extension 23 16.5  Ankle dorsiflexion      Ankle plantarflexion      Ankle inversion      Ankle eversion       (Blank rows = not tested)       FUNCTIONAL TESTS:  Timed up and go (TUG): 21sec -Pt required use of UE to get out of chair     TODAY'S TREATMENT:   7/13 Warm-up: NuStep, L1 75mn LAQ - 3x10, 2x5lb Mini squats to table - 3x8 Balance exercises - 2x30sec each -Marching with UE support for stability -Lateral weight shifts -Semi-tandem weight shifts -Narrow base, 1x eyes closed   Reviewed use of Morton's Met pad to reduce metatarsal force on her foot. Her foot is likely effecting her ability to ambulate.   7/11 Manual - STM and TrP release of the R glute med/TFL, STM of plantar fascia w/ grade III/IV first ray mobs LTR 2x10 SLR 2x10 Pelvic tilt x15 Bridging with lateral shift x8     6/29 Manual - STM and TrP release to R glute med/max Education on theracane use for TrP release Seated hip abd - 2x10, red band Seated marches - 2x10, 2.5lb Reviewed HEP and stretching       PATIENT EDUCATION:  Education details: HEP, exercise, safety with transfers Person educated: Patient Education method: Explanation Education comprehension: verbalized understanding     HOME EXERCISE PROGRAM: -Gentle stretching           -Knee-to-chest           -Piriformis stretch -Self-release of trigger points   ASSESSMENT:   CLINICAL IMPRESSION:    The patient continues to progress well. When she doesn't have UE support she will have some pain down into the right hip. Therapy worked on weight shifting and standing exercises with her. She also performed sit to stand transfers from an elevated surface. She tolerates balance exercises well - displays more lateral shifting when not using UE support. She did better as she practiced more. We will continue to work on functional strengthening as well as intervention to reduce her pain.    OBJECTIVE IMPAIRMENTS decreased balance, decreased endurance, difficulty walking, decreased ROM, and decreased strength.    ACTIVITY LIMITATIONS bending, standing, stairs, transfers, and bed mobility   PARTICIPATION LIMITATIONS: shopping and community activity   PERSONAL FACTORS Age, Fitness, Time since onset of injury/illness/exacerbation, and 3+ comorbidities: (see PMH)  are also affecting patient's functional outcome.    REHAB POTENTIAL: Good   CLINICAL DECISION MAKING: Evolving/moderate complexity   EVALUATION COMPLEXITY: Moderate     GOALS: Goals reviewed with patient? Yes   SHORT TERM GOALS: Target date: 08/09/2021  Pt will improve hip flexion and ankle dorsiflexion ROM to [facility] improve bed mobility and gait mechanics. Baseline: Goal status: INITIAL   2.  Pt will be able to transfer from supine to seated and from sit-standing without pain or assistance. Baseline:  Goal status: INITIAL   3.  Pt will improve bil hip abduction strength by 20%. Baseline:  Goal status: INITIAL     LONG TERM GOALS: Target date: 08/30/2021    Pt will improve TUG score by MCID of 3.4 seconds to demonstrate a reduced risk for falls. Baseline:  Goal status: INITIAL   2.  Pt will be able to stand or ambulate 20 minutes with normalized gait mechanics without pain. Baseline:  Goal status: INITIAL   3.  Pt will be fully independent with exercise program to maintain current level of fitness. Baseline:  Goal  status: INITIAL     PLAN: PT FREQUENCY: 1-2x/week   PT DURATION: 6 weeks   PLANNED INTERVENTIONS: Therapeutic exercises, Therapeutic activity, Neuromuscular re-education, Balance training, Gait training, Patient/Family education, Joint mobilization, Stair training, Aquatic Therapy, Dry Needling, Cryotherapy, and Moist heat   PLAN FOR NEXT SESSION: Continue STM as indicated. Lateral hip, core, ankle strengthening, gait training. Consider aerobic endurance assessment.                   Carolyne Littles PT DPT  08/23/2021  Jolene Schimke Student-PT 08/23/2021  During this treatment session, the therapist was present, participating in and directing the treatment.

## 2021-08-24 ENCOUNTER — Encounter (HOSPITAL_BASED_OUTPATIENT_CLINIC_OR_DEPARTMENT_OTHER): Payer: Self-pay | Admitting: Physical Therapy

## 2021-08-25 ENCOUNTER — Ambulatory Visit (HOSPITAL_BASED_OUTPATIENT_CLINIC_OR_DEPARTMENT_OTHER): Payer: Medicare HMO | Admitting: Physical Therapy

## 2021-08-26 ENCOUNTER — Encounter (HOSPITAL_BASED_OUTPATIENT_CLINIC_OR_DEPARTMENT_OTHER): Payer: Self-pay | Admitting: Physical Therapy

## 2021-08-30 ENCOUNTER — Ambulatory Visit (HOSPITAL_BASED_OUTPATIENT_CLINIC_OR_DEPARTMENT_OTHER): Payer: Medicare HMO | Admitting: Physical Therapy

## 2021-08-30 ENCOUNTER — Ambulatory Visit (INDEPENDENT_AMBULATORY_CARE_PROVIDER_SITE_OTHER): Payer: Medicare HMO | Admitting: Psychologist

## 2021-08-30 DIAGNOSIS — M25551 Pain in right hip: Secondary | ICD-10-CM

## 2021-08-30 DIAGNOSIS — F411 Generalized anxiety disorder: Secondary | ICD-10-CM

## 2021-08-30 DIAGNOSIS — R2689 Other abnormalities of gait and mobility: Secondary | ICD-10-CM | POA: Diagnosis not present

## 2021-08-30 DIAGNOSIS — F330 Major depressive disorder, recurrent, mild: Secondary | ICD-10-CM | POA: Diagnosis not present

## 2021-08-30 NOTE — Progress Notes (Signed)
[facility] Counselor Initial Adult Exam  Name: [name] [name] Date: 08/30/2021 MRN: [id number] DOB: 09/01/1940 PCP: Early, Sara E, NP  Time spent: 11:05 am to 11:40 am; total time: 35 minutes  This session was held via phone teletherapy due to the coronavirus risk at this time. The patient consented to phone teletherapy and was located at her home during this session. She is aware it is the responsibility of the patient to secure confidentiality on her end of the session. The provider was in a private home office for the duration of this session. Limits of confidentiality were discussed with the patient.   Guardian/Payee:  NA    Paperwork requested: No   Reason for Visit /Presenting Problem: Anxiety and depression  Mental Status Exam: Appearance:   NA     Behavior:  Appropriate  Motor:  Normal  Speech/Language:   Clear and Coherent  Affect:  Appropriate  Mood:  normal  Thought process:  normal  Thought content:    WNL  Sensory/Perceptual disturbances:    WNL  Orientation:  oriented to person, place, and time/date  Attention:  Good  Concentration:  Good  Memory:  WNL  Fund of knowledge:   Good  Insight:    Fair  Judgment:   Good  Impulse Control:  Good     Reported Symptoms:  The patient endorsed experiencing the following: racing thoughts, feeling on edge, feeling restless, and feeling overwhelmed by different thoughts. She denied suicidal and homicidal ideation.   The patient endorsed experiencing the following: feeling down, sad, lack of motivation, fatigue, social isolation, avoiding pleasurable activities, rumination of negative thoughts, and low self-esteem. She denied suicidal and homicidal ideation.   Risk Assessment: Danger to Self:  No Self-injurious Behavior: No Danger to Others: No Duty to Warn:no Physical Aggression / Violence:No  Access to Firearms a concern: No  Gang Involvement:No  Patient / guardian was educated about steps to take  if suicide or homicide risk level increases between visits: n/a While future psychiatric events cannot be accurately predicted, the patient does not currently require acute inpatient psychiatric care and does not currently meet [place] involuntary commitment criteria.  Substance Abuse History: Current substance abuse: No     Past Psychiatric History:   Previous psychological history is significant for anxiety and depression Outpatient Providers:NA History of Psych Hospitalization: No  Psychological Testing:  NA    Abuse History:  Victim of: No.,  NA    Report needed: No. Victim of Neglect:No. Perpetrator of  NA   Witness / Exposure to Domestic Violence: No   Protective Services Involvement: No  Witness to Community Violence:  No   Family History:  Family History  Problem Relation Age of Onset   Cancer Mother    Intellectual disability Mother    Cancer Father    Early death Father    Hypertension Father    Cancer Sister    Arthritis Maternal Grandmother    Asthma Paternal Grandmother    Asthma Paternal Grandfather     Living situation: the patient lives in an assisted living facility  Sexual Orientation: Straight  Relationship Status: widowed  Name of spouse / other:NA If a parent, number of children / ages:Patient has three sons and two grandchildren.   Support Systems: Family and friends.   Financial Stress:  No   Income/Employment/Disability: Social Security Retirement  Military Service: No   Educational History: Education: technical college  Religion/Sprituality/World View: Christian/Protestant   Any cultural differences that may   affect / interfere with treatment:  not applicable   Recreation/Hobbies: Doing activities at the care facility where she resides.   Stressors: Other: different stressors    Strengths: Supportive Relationships  Barriers:  NA   Legal History: Pending legal issue / charges: The patient has no significant history of  legal issues. History of legal issue / charges:  NA  Medical History/Surgical History: reviewed Past Medical History:  Diagnosis Date   Acute non-recurrent pansinusitis 04/20/2021   Acute respiratory failure with hypoxia ([facility]) 03/10/2021   Allergy    Anxiety    Arthritis    At risk for allergic reaction to medication 02/04/2021   Blood transfusion without reported diagnosis    Cancer ([facility])    Melanoma on Great toe   Candida infection 04/20/2021   Carotid artery plaque, 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   Cataract    Closed fracture of upper end of humerus 11/03/2017   COPD (chronic obstructive pulmonary disease) ([facility])    COVID-19 vaccine series started 07/01/2020   COVID-19 virus infection 03/09/2021   Depression    Encounter for Medicare annual wellness exam 07/01/2020   Encounter to establish care 06/03/2020   Essential hypertension 05/19/2019   Essential hypertension 05/19/2019   Female stress incontinence 02/14/2018   GERD (gastroesophageal reflux disease)    History of opioid abuse ([facility]) 05/19/2019   Hospital discharge follow-up 03/24/2021   Hyperglycemia 05/19/2019   Hyperlipidemia    Hypertension    Irritation of right eye 12/09/2020   Menopause present 02/11/2018   Need for shingles vaccine 07/01/2020   Osteoporosis    Prediabetes 05/22/2019   Sleep apnea    Subacute cough 03/24/2021   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    Past Surgical History:  Procedure Laterality Date   ABDOMINAL HYSTERECTOMY  1980   CHOLECYSTECTOMY  1980   MELANOMA EXCISION Right    Great Toe   REPLACEMENT TOTAL KNEE Bilateral 2000   sacral neuromodulation      Medications: Current Outpatient Medications  Medication Sig Dispense Refill   albuterol (VENTOLIN HFA) 108 (90 Base) MCG/ACT inhaler Inhale 1-2 puffs into the lungs every 4 (four) hours as needed for wheezing or shortness of breath. 18 g 1   ALPRAZolam (XANAX) 0.25 MG tablet TAKE (1) TABLET BY MOUTH TWICE DAILY AT 11AM & 5PM FOR ANXIETY. (Patient  taking differently: Take 0.25 mg by mouth 2 (two) times daily. 1100 and 1500 And an extra 0.25mg oral twice daily as needed for anxiety) 60 tablet 0   AMBULATORY NON FORMULARY MEDICATION One four wheel rolling walker with seat and hand brakes. Brand/style covered by insurance. Medically necessary for ICD-10: R26.81, Z74.1 1 Units 0   amLODIPine-Valsartan-HCTZ 5-160-12.5 MG TABS Take 2 tablets by mouth daily. 180 tablet 1   ARTHRITIS PAIN RELIEVING 0.075 % topical cream Apply 1 application topically 3 (three) times daily as needed (neuropathic pain in feet).     atorvastatin (LIPITOR) 10 MG tablet TAKE (1) TABLET BY MOUTH ONCE DAILY. 30 tablet 10   Bacillus Coagulans-Inulin (PROBIOTIC FORMULA) 1-250 BILLION-MG CAPS TAKE 1 CAPSULE (250MG) BY MOUTH 2 TIMES A DAY. 60 capsule 11   blood glucose meter kit and supplies KIT Meter, test strips, lancets, and alcohol swabs. Use for testing blood sugar every morning before meals and up to 3 additional times per day as needed. Brand based on patient and insurance coverage. 100 strips with 99 refills, 100 lancets with 99 refills, 200 alcohol swabs with 99 refills. Dx: E11.65 1 each 99     camphor-menthol (SARNA) lotion Apply 1 application topically as needed for itching. (Patient taking differently: Apply 1 application  topically 2 (two) times daily as needed for itching.) 222 mL 0   carvedilol (COREG) 6.25 MG tablet TAKE (1) TABLET BY MOUTH TWICE DAILY WITH A MEAL. 180 tablet 3   cephALEXin (KEFLEX) 250 MG capsule Take 1 capsule (250 mg total) by mouth daily. 30 capsule 5   cetirizine (ZYRTEC) 10 MG tablet TAKE 1 TABLET BY MOUTH ONCE A DAY. 30 tablet 10   cloNIDine (CATAPRES) 0.1 MG tablet Take 1 tablet (0.1 mg total) by mouth 3 (three) times daily as needed (systolic blood pressurre above 190). 15 tablet 0   cloNIDine (CATAPRES) 0.2 MG tablet TAKE (1) TABLET BY MOUTH ONCE EVERY HOUR AS NEEDED FOR SBP>190. **MAX 3 TABLETS (0.6MG) IN 24 HOURS** 30 tablet 3    dapagliflozin propanediol (FARXIGA) 10 MG TABS tablet Take 1 tablet (10 mg total) by mouth daily before breakfast. 90 tablet 3   diphenhydrAMINE (BENADRYL) 25 mg capsule Take 1 capsule (25 mg total) by mouth every 8 (eight) hours as needed for itching or allergies. 30 capsule 1   EASYMAX TEST test strip      ELIQUIS 5 MG TABS tablet TAKE (1) TABLET BY MOUTH TWICE DAILY. 60 tablet 10   estradiol (ESTRACE) 0.1 MG/GM vaginal cream Place 0.5g nightly for two weeks then twice a week after (Patient taking differently: Place 1 Applicatorful vaginally 2 (two) times a week. Mondays and Thursdays) 30 g 11   estradiol (ESTRING) 2 MG vaginal ring Place 2 mg vaginally every 3 (three) months. follow package directions 1 each 4   famotidine (PEPCID) 20 MG tablet Take 1 tablet (20 mg total) by mouth daily as needed for heartburn or indigestion. 90 tablet 3   FEROSUL 325 (65 Fe) MG tablet TAKE (1) TABLET BY MOUTH ONCE DAILY. 30 tablet 10   fluconazole (DIFLUCAN) 150 MG tablet Take 1 tablet (150 mg total) by mouth once as needed for up to 1 dose. 1 tablet 1   guaiFENesin (MUCINEX) 600 MG 12 hr tablet Take 2 tablets (1,200 mg total) by mouth 2 (two) times daily as needed for to loosen phlegm or cough. Use for 7 days then as needed if cough is improving. 30 tablet 1   hydrALAZINE (APRESOLINE) 25 MG tablet TAKE 1 TABLET BY MOUTH TWICE A DAY. 60 tablet 10   Lactase 9000 units TABS Take one tablet with the first bite of meals containing milk or dairy products. (Patient taking differently: Take 9,000 Units by mouth See admin instructions. Take 9000units with the first bite of meals containing milk or dairy products.) 90 tablet 11   mirtazapine (REMERON) 7.5 MG tablet Take 1 tablet (7.5 mg total) by mouth at bedtime. For depression and anxiety symptoms 30 tablet 2   pantoprazole (PROTONIX) 40 MG tablet TAKE (1) TABLET BY MOUTH EVERY MORNING FOR GERD. 30 tablet 10   PARoxetine (PAXIL) 40 MG tablet Take 40 mg by mouth daily.      TRELEGY ELLIPTA 100-62.5-25 MCG/ACT AEPB INHALE 1 PUFF INTO THE LUNGS DAILY. ** RINSE MOUTH AFTER USE** (Patient taking differently: Inhale 1 puff into the lungs daily.) 60 each 0   No current facility-administered medications for this visit.    Allergies  Allergen Reactions   Azithromycin Anaphylaxis and Hives   Bee Venom Anaphylaxis   Erythromycin Base Anaphylaxis   Metronidazole Anaphylaxis   Nitrofurantoin Macrocrystal Anaphylaxis   Penicillins Anaphylaxis and Hives  childhood   Sulfa Antibiotics Anaphylaxis and Hives    Childhood   Cymbalta [Duloxetine Hcl]    Ivp Dye [Iodinated Contrast Media]    Penicillin G Hives   Codeine Rash   Erythromycin Rash and Hives   Fosfomycin Rash   Levofloxacin Rash   Nitrofurantoin Hives   Percocet [Oxycodone-Acetaminophen] Rash    Diagnoses:   F41.1 generalized anxiety disorder and F33.0 major depressive affective disorder, recurrent, mild  Plan of Care: The patient is a 81 year old Caucasian woman who was referred due to experiencing anxiety and depression. The patient lives in an assisted care facility. The patient meets criteria for a diagnosis of F41.1 generalized anxiety disorder based off of the following: racing thoughts, feeling on edge, feeling restless, and feeling overwhelmed by different thoughts. She denied suicidal and homicidal ideation. 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 She denied suicidal and homicidal ideation.   The patient stated that she wants coping skills and to process emotions that she is experiencing. She wants to feel less lost.   This psychologist makes the recommendation that the patient participate in therapy at least once a month. If possible, bi-weekly.     Conception Chancy, PsyD

## 2021-08-30 NOTE — Progress Notes (Signed)
                Ilaria Much, PsyD 

## 2021-08-30 NOTE — Therapy (Signed)
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   Candida infection 04/20/2021   Carotid artery plaque, 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   Cataract     Closed fracture of upper end of humerus 11/03/2017   COPD (chronic obstructive pulmonary disease) ([place])     COVID-19 vaccine series started 07/01/2020   COVID-19 virus infection 03/09/2021   Depression     Encounter for Medicare annual wellness exam 07/01/2020   Encounter to establish care 06/03/2020   Essential hypertension 05/19/2019   Essential hypertension 05/19/2019   Female stress incontinence 02/14/2018   GERD (gastroesophageal reflux disease)     History of opioid abuse ([place]) 05/19/2019   Hospital discharge follow-up 03/24/2021   Hyperglycemia 05/19/2019   Hyperlipidemia     Hypertension     Irritation of right eye 12/09/2020   Menopause present 02/11/2018   Need for shingles vaccine 07/01/2020   Osteoporosis     Prediabetes 05/22/2019   Sleep apnea     Subacute cough 03/24/2021   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         Past Surgical History:  Procedure Laterality Date   [place] Right      Great Toe   REPLACEMENT TOTAL KNEE Bilateral 2000   sacral neuromodulation            Patient Active Problem List    Diagnosis Date Noted   Gait instability 07/05/2021   PAD (peripheral artery disease) ([place]) 05/10/2021   Lymphadenopathy, inguinal 05/05/2021   Type 2 diabetes mellitus  with hyperglycemia, without long-term current use of insulin ([place]) 03/24/2021   Hypertension associated with diabetes ([place]) 02/04/2021   Lactose intolerance 11/11/2020   Pulmonary nodules 10/21/2020   Neuropathy 07/01/2020   Encounter for medication management 06/03/2020   Coronary artery disease involving native heart without angina pectoris 12/18/2019   History of malignant melanoma 12/18/2019   Basal cell carcinoma of skin 12/18/2019   History of surgery for cerebral aneurysm 12/18/2019   Hyperlipidemia associated with type 2 diabetes mellitus ([place]) 05/22/2019   Adjustment disorder with mixed anxiety and depressed mood 05/19/2019   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 05/19/2019   Bilateral carotid artery occlusion 05/19/2019   Anemia 05/19/2019   Bipolar disorder ([place]) 05/19/2019   Chronic obstructive lung disease ([place]) 05/19/2019   Chronic pain syndrome 05/19/2019   Gastroesophageal reflux disease without esophagitis 05/19/2019   Generalized anxiety disorder 05/19/2019   Hypertensive heart disease without congestive heart failure 05/19/2019   Obstructive sleep apnea syndrome 05/19/2019   Polyp of colon 05/19/2019   Depression, major, single episode, moderate ([place]) 05/19/2019   Spinal stenosis in cervical region 05/19/2019   History of TIA (transient ischemic attack) 05/19/2019   Mixed urinary incontinence  due to female genital prolapse 05/19/2019   Tricuspid valve disorders, 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   Panic disorder with agoraphobia 02/22/2018   Diverticulosis of large intestine 02/14/2018   Localized, primary osteoarthritis of shoulder region 02/14/2018   Mitral valve regurgitation 02/14/2018   Grade I internal hemorrhoids 02/14/2018   Bilateral carpal tunnel syndrome 02/11/2018   Morbid obesity ([place]) 02/11/2018   Nondependent opioid abuse in remission ([place]) 02/11/2018      PCP: Orma Render, NP   REFERRING PROVIDER:  Orma Render, NP   REFERRING DIAG:  R26.81 (ICD-10-CM) - Gait instability  Z74.1 (ICD-10-CM) - Dependent for walking      THERAPY DIAG:  Other abnormalities of gait and mobility   Pain in right hip   Rationale for Evaluation and Treatment Rehabilitation   ONSET DATE: Jan 2023   SUBJECTIVE:    SUBJECTIVE STATEMENT: The patient reports the needling has helped. She is having very little back pain today. She continues to have foot pain.     PERTINENT HISTORY: Bil femur fractures, T2DM, CAD, cancer (melanoma) and subsequent R great toe amputation, anxiety   PAIN:  Are you having pain? Yes: NPRS scale: 2/10 Pain location: R posterolateral hip Pain description: pinching, pulling muscle pain Aggravating factors: Sitting, standing>3mn, walking>135m  Relieving factors: Ice, self-massage, stretching   PRECAUTIONS: None   WEIGHT BEARING RESTRICTIONS No   FALLS:  Has patient fallen in last 6 months? No   LIVING ENVIRONMENT: Lives with: lives in an assisted living facility Lives in: House/apartment Stairs: No Has following equipment at home: [employer] 4 wheeled   OCCUPATION: Retired PTA   PLOF: Independent   PATIENT GOALS: Wants to improve walking/fitness and be able to visit her sister in [place] October.     OBJECTIVE:    DIAGNOSTIC FINDINGS:  None   PATIENT SURVEYS:  FOTO 44                        Expected 5535n 11 visits   COGNITION:           Overall cognitive status: Within functional limits for tasks assessed                          SENSATION: Impaired - pt reports decreased sensation in bil feet from T2DM neuropathy     POSTURE: rounded shoulders, forward head, and flexed trunk    PALPATION: Tender to palpation of glute med/min, piriformis, IT band   LOWER EXTREMITY ROM:   Active ROM Right eval Left eval  Hip flexion Limited, concordant pain Limited  Hip extension      Hip abduction      Hip adduction      Hip internal rotation  Painful    Hip external rotation Painful    Knee flexion      Knee extension Painful    Ankle dorsiflexion Limited Limited  Ankle plantarflexion      Ankle inversion      Ankle eversion       (Blank rows = not tested)   LOWER EXTREMITY MMT:   MMT Right eval Left eval  Hip flexion 26.2 36.1  Hip extension      Hip abduction 21 27.1 not able to maintain hold   Hip adduction      Hip internal rotation      Hip external rotation      Knee flexion  Knee extension 23 16.5  Ankle dorsiflexion      Ankle plantarflexion      Ankle inversion      Ankle eversion       (Blank rows = not tested)       FUNCTIONAL TESTS:  Timed up and go (TUG): 21sec -Pt required use of UE to get out of chair     TODAY'S TREATMENT:  7/25 Bilateral er 2x10 yellow  Bilateral horizontal abduction 2x10 yellow  Bilateral flexion yellow 2x10   Trigger Point Dry-Needling  Treatment instructions: Expect mild to moderate muscle soreness. S/S of pneumothorax if dry needled over a lung field, and to seek immediate medical attention should they occur. Patient verbalized understanding of these instructions and education.  Patient Consent Given: Yes Education handout provided: Yes Muscles treated: 3 spots in the  right glut med in side lying using a .30x75 needle  Electrical stimulation performed: No Parameters: N/A Treatment response/outcome: good twitch  Manual - STM and TrP release of the R glute med/TFL, STM of plantar fascia w/ grade III/IV first ray mobs        7/18 Warm-up: NuStep, L1 4mn 3x6 sit to stand, speed/power focus without UE use S/L clamshells, 2x8 bil  Placed met pad in her shoe. Worked on gait with the met pad. Improved gait pattern noted   Trigger Point Dry-Needling  Treatment instructions: Expect mild to moderate muscle soreness. S/S of pneumothorax if dry needled over a lung field, and to seek immediate medical attention should they occur. Patient verbalized  understanding of these instructions and education.  Patient Consent Given: Yes Education handout provided: Yes Muscles treated: 3 spots in the  right glut med in side lying using a .30x75 needle  Electrical stimulation performed: No Parameters: N/A Treatment response/outcome: good twitch  Manual - STM and TrP release of the R glute med/TFL, STM of plantar fascia w/ grade III/IV first ray mobs        7/13 Warm-up: NuStep, L1 565m LAQ - 3x10, 2x5lb Mini squats to table - 3x8 Balance exercises - 2x30sec each -Marching with UE support for stability -Lateral weight shifts -Semi-tandem weight shifts -Narrow base, 1x eyes closed   Reviewed use of Morton's Met pad to reduce metatarsal force on her foot. Her foot is likely effecting her ability to ambulate.   7/11 Manual - STM and TrP release of the R glute med/TFL, STM of plantar fascia w/ grade III/IV first ray mobs LTR 2x10 SLR 2x10 Pelvic tilt x15 Bridging with lateral shift x8     6/29 Manual - STM and TrP release to R glute med/max Education on theracane use for TrP release Seated hip abd - 2x10, red band Seated marches - 2x10, 2.5lb Reviewed HEP and stretching       PATIENT EDUCATION:  Education details: HEP, exercise, safety with transfers Person educated: Patient Education method: Explanation Education comprehension: verbalized understanding     HOME EXERCISE PROGRAM: -Gentle stretching           -Knee-to-chest           -Piriformis stretch -Self-release of trigger points   ASSESSMENT:   CLINICAL IMPRESSION:   Therapy placed a Metatarsal pad in her shoe to reduce the pain in her foot. Her foot pain is causing her to shift laterally which is likely contributing to her pain in her hip. She had a reduction in foot pain with the Met pad. Therapy trialed dry needling today. She had a good twitch in  her glut med. Therapy performed manual therapy to the area as well. Overall she I making progress. We will  continue to progress exercises as tolerated.   OBJECTIVE IMPAIRMENTS decreased balance, decreased endurance, difficulty walking, decreased ROM, and decreased strength.    ACTIVITY LIMITATIONS bending, standing, stairs, transfers, and bed mobility   PARTICIPATION LIMITATIONS: shopping and community activity   PERSONAL FACTORS Age, Fitness, Time since onset of injury/illness/exacerbation, and 3+ comorbidities: (see PMH)  are also affecting patient's functional outcome.    REHAB POTENTIAL: Good   CLINICAL DECISION MAKING: Evolving/moderate complexity   EVALUATION COMPLEXITY: Moderate     GOALS: Goals reviewed with patient? Yes   SHORT TERM GOALS: Target date: 08/09/2021 assessed on 7/19  Pt will improve hip flexion and ankle dorsiflexion ROM to [facility] to improve bed mobility and gait mechanics. Baseline: Goal status: improving    2.  Pt will be able to transfer from supine to seated and from sit-standing without pain or assistance.  Baseline:  Goal status: Improving    3.  Pt will improve bil hip abduction strength by 20%. Baseline:  Goal status: Improving      LONG TERM GOALS: Target date: 08/30/2021    Pt will improve TUG score by MCID of 3.4 seconds to demonstrate a reduced risk for falls. Baseline:  Goal status: INITIAL   2.  Pt will be able to stand or ambulate 20 minutes with normalized gait mechanics without pain. Baseline:  Goal status: INITIAL   3.  Pt will be fully independent with exercise program to maintain current level of fitness. Baseline:  Goal status: INITIAL     PLAN: PT FREQUENCY: 1-2x/week   PT DURATION: 6 weeks   PLANNED INTERVENTIONS: Therapeutic exercises, Therapeutic activity, Neuromuscular re-education, Balance training, Gait training, Patient/Family education, Joint mobilization, Stair training, Aquatic Therapy, Dry Needling, Cryotherapy, and Moist heat   PLAN FOR NEXT SESSION: Continue STM as indicated. Lateral hip, core, ankle strengthening,  gait training. Consider aerobic endurance assessment. Assess tolerance to met pad                     Carolyne Littles PT DPT  08/23/2021

## 2021-08-30 NOTE — Plan of Care (Signed)

## 2021-08-31 ENCOUNTER — Encounter (HOSPITAL_BASED_OUTPATIENT_CLINIC_OR_DEPARTMENT_OTHER): Payer: Self-pay | Admitting: Physical Therapy

## 2021-09-01 ENCOUNTER — Encounter (HOSPITAL_BASED_OUTPATIENT_CLINIC_OR_DEPARTMENT_OTHER): Payer: Self-pay | Admitting: Physical Therapy

## 2021-09-01 ENCOUNTER — Ambulatory Visit (HOSPITAL_BASED_OUTPATIENT_CLINIC_OR_DEPARTMENT_OTHER): Payer: Medicare HMO | Admitting: Physical Therapy

## 2021-09-01 DIAGNOSIS — R2689 Other abnormalities of gait and mobility: Secondary | ICD-10-CM

## 2021-09-01 DIAGNOSIS — M25551 Pain in right hip: Secondary | ICD-10-CM

## 2021-09-01 NOTE — Therapy (Addendum)
Therapist)               PT End of Session - 09/01/21 1155     Visit Number 7    Number of Visits 13    Date for PT Re-Evaluation 08/30/21    Authorization Type [employer] Medicare ( no [place])    PT Start Time 1145    PT Stop Time 1227    PT Time Calculation (min) 42 min    Activity Tolerance Patient tolerated treatment well    Behavior During Therapy WFL for tasks assessed/performed                  OUTPATIENT PHYSICAL THERAPY LOWER EXTREMITY Treatment/ Progress Note      Patient Name: [name] [name] MRN: [id number] DOB:25-Aug-1940, 81 y.o., female Today's Date: 07/19/2021    Progress Note Reporting Period  06/13/2023to 09/01/2021  See note below for Objective Data and Assessment of Progress/Goals.           Past Medical History:  Diagnosis Date   Acute non-recurrent pansinusitis 04/20/2021   Acute respiratory failure with hypoxia (H[facility]) 03/10/2021   Allergy     Anxiety     Arthritis     At risk for allergic reaction to medication 02/04/2021   Blood transfusion without reported diagnosis     Cancer ([place])      Melanoma on Great toe   Candida infection 04/20/2021   Carotid artery plaque, 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   Cataract     Closed fracture of upper end of humerus 11/03/2017   COPD (chronic obstructive pulmonary disease) ([place])     COVID-19 vaccine series started 07/01/2020   COVID-19 virus infection 03/09/2021   Depression     Encounter for Medicare annual wellness exam 07/01/2020   Encounter to establish care 06/03/2020   Essential hypertension 05/19/2019   Essential hypertension 05/19/2019   Female stress incontinence 02/14/2018   GERD (gastroesophageal reflux disease)     History of opioid abuse ([place]) 05/19/2019   Hospital discharge follow-up 03/24/2021   Hyperglycemia 05/19/2019   Hyperlipidemia     Hypertension     Irritation of right eye 12/09/2020   Menopause present 02/11/2018   Need for shingles vaccine 07/01/2020   Osteoporosis     Prediabetes 05/22/2019   Sleep  apnea     Subacute cough 03/24/2021   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         Past Surgical History:  Procedure Laterality Date   [place] Right      Great Toe   REPLACEMENT TOTAL KNEE Bilateral 2000   sacral neuromodulation            Patient Active Problem List    Diagnosis Date Noted   Gait instability 07/05/2021   PAD (peripheral artery disease) ([place]) 05/10/2021   Lymphadenopathy, inguinal 05/05/2021   Type 2 diabetes mellitus with hyperglycemia, without long-term current use of insulin ([place]) 03/24/2021   Hypertension associated with diabetes ([place]) 02/04/2021   Lactose intolerance 11/11/2020   Pulmonary nodules 10/21/2020   Neuropathy 07/01/2020   Encounter for medication management 06/03/2020   Coronary artery disease involving native heart without angina pectoris 12/18/2019   History of malignant melanoma 12/18/2019   Basal cell carcinoma of skin 12/18/2019   History of surgery for cerebral aneurysm 12/18/2019   Hyperlipidemia associated with type 2 diabetes mellitus ([place]) 05/22/2019   Adjustment disorder with mixed anxiety and depressed mood  05/19/2019   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 05/19/2019   Bilateral carotid artery occlusion 05/19/2019   Anemia 05/19/2019   Bipolar disorder ([place]) 05/19/2019   Chronic obstructive lung disease ([place]) 05/19/2019   Chronic pain syndrome 05/19/2019   Gastroesophageal reflux disease without esophagitis 05/19/2019   Generalized anxiety disorder 05/19/2019   Hypertensive heart disease without congestive heart failure 05/19/2019   Obstructive sleep apnea syndrome 05/19/2019   Polyp of colon 05/19/2019   Depression, major, single episode, moderate ([place]) 05/19/2019   Spinal stenosis in cervical region 05/19/2019   History of TIA (transient ischemic attack) 05/19/2019   Mixed urinary incontinence due to  female genital prolapse 05/19/2019   Tricuspid valve disorders, 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   Panic disorder with agoraphobia 02/22/2018   Diverticulosis of large intestine 02/14/2018   Localized, primary osteoarthritis of shoulder region 02/14/2018   Mitral valve regurgitation 02/14/2018   Grade I internal hemorrhoids 02/14/2018   Bilateral carpal tunnel syndrome 02/11/2018   Morbid obesity ([place]) 02/11/2018   Nondependent opioid abuse in remission ([place]) 02/11/2018      PCP: Orma Render, NP   REFERRING PROVIDER: Orma Render, NP   REFERRING DIAG:  R26.81 (ICD-10-CM) - Gait instability  Z74.1 (ICD-10-CM) - Dependent for walking      THERAPY DIAG:  Other abnormalities of gait and mobility   Pain in right hip   Rationale for Evaluation and Treatment Rehabilitation   ONSET DATE: Jan 2023   SUBJECTIVE:    SUBJECTIVE STATEMENT: The patients foot pain continues to be a limiting factor. She reports she has had trouble going up and down the steps and would like to work on that.   PERTINENT HISTORY: Bil femur fractures, T2DM, CAD, cancer (melanoma) and subsequent R great toe amputation, anxiety   PAIN:  Are you having pain? Yes: NPRS scale: 2/10 Pain location: R posterolateral hip Pain description: pinching, pulling muscle pain Aggravating factors: Sitting, standing>35mn, walking>120m  Relieving factors: Ice, self-massage, stretching   PRECAUTIONS: None   WEIGHT BEARING RESTRICTIONS No   FALLS:  Has patient fallen in last 6 months? No   LIVING ENVIRONMENT: Lives with: lives in an assisted living facility Lives in: House/apartment Stairs: No Has following equipment at home: [employer] 4 wheeled   OCCUPATION: Retired PTA   PLOF: Independent   PATIENT GOALS: Wants to improve walking/fitness and be able to visit her sister in [place] October.     OBJECTIVE:    DIAGNOSTIC FINDINGS:  None   PATIENT SURVEYS:  FOTO 44                        Expected 5517n 11  visits   COGNITION:           Overall cognitive status: Within functional limits for tasks assessed                          SENSATION: Impaired - pt reports decreased sensation in bil feet from T2DM neuropathy     POSTURE: rounded shoulders, forward head, and flexed trunk    PALPATION: Tender to palpation of glute med/min, piriformis, IT band   LOWER EXTREMITY ROM:   Active ROM Right eval Left eval  Hip flexion Limited, concordant pain Limited  Hip extension      Hip abduction      Hip adduction      Hip internal rotation Painful  Hip external rotation Painful    Knee flexion      Knee extension Painful    Ankle dorsiflexion Limited Limited  Ankle plantarflexion      Ankle inversion      Ankle eversion       (Blank rows = not tested)   LOWER EXTREMITY MMT:   MMT Right eval Left eval right left  Hip flexion 26.2 36.1 25.7 33.8  Hip extension        Hip abduction 21 27.1 not able to maintain hold  25.7 27  Hip adduction        Hip internal rotation        Hip external rotation        Knee flexion        Knee extension 23 16.5 30.1 29.5  Ankle dorsiflexion        Ankle plantarflexion        Ankle inversion        Ankle eversion         (Blank rows = not tested)       FUNCTIONAL TESTS:  Timed up and go (TUG): 21sec -Pt required use of UE to get out of chair     TODAY'S TREATMENT: 7/27  Reviewed goals / test and measures   LTR 2x10 with cuing for technique  Bridge 3x10 with cuing for amplitude of movement   Step up 2x10 2 inch  Lateral step up 2x10 2 inch   There-act: worked on technique with supine to sit and sit to supine transfer.     7/25 Bilateral er 2x10 yellow  Bilateral horizontal abduction 2x10 yellow  Bilateral flexion yellow 2x10   Hip abduction yellow 3x10  Seated hip flexion 3x10 yellow   Trigger Point Dry-Needling  Treatment instructions: Expect mild to moderate muscle soreness. S/S of pneumothorax if dry needled over a  lung field, and to seek immediate medical attention should they occur. Patient verbalized understanding of these instructions and education.  Patient Consent Given: Yes Education handout provided: Yes Muscles treated: 3 spots in the  right glut med in side lying using a .30x75 needle  Electrical stimulation performed: No Parameters: N/A Treatment response/outcome: good twitch  Manual - STM and TrP release of the R glute med/TFL, STM of plantar fascia w/ grade III/IV first ray mobs        7/18 Warm-up: NuStep, L1 48mn 3x6 sit to stand, speed/power focus without UE use S/L clamshells, 2x8 bil  Placed met pad in her shoe. Worked on gait with the met pad. Improved gait pattern noted   Trigger Point Dry-Needling  Treatment instructions: Expect mild to moderate muscle soreness. S/S of pneumothorax if dry needled over a lung field, and to seek immediate medical attention should they occur. Patient verbalized understanding of these instructions and education.  Patient Consent Given: Yes Education handout provided: Yes Muscles treated: 3 spots in the  right glut med in side lying using a .30x75 needle  Electrical stimulation performed: No Parameters: N/A Treatment response/outcome: good twitch  Manual - STM and TrP release of the R glute med/TFL, STM of plantar fascia w/ grade III/IV first ray mobs        7/13 Warm-up: NuStep, L1 541m LAQ - 3x10, 2x5lb Mini squats to table - 3x8 Balance exercises - 2x30sec each -Marching with UE support for stability -Lateral weight shifts -Semi-tandem weight shifts -Narrow base, 1x eyes closed   Reviewed use of Morton's Met pad to reduce metatarsal force  on her foot. Her foot is likely effecting her ability to ambulate.       PATIENT EDUCATION:  Education details: HEP, exercise, safety with transfers Person educated: Patient Education method: Explanation Education comprehension: verbalized understanding     HOME EXERCISE  PROGRAM: -Gentle stretching           -Knee-to-chest           -Piriformis stretch -Self-release of trigger points   ASSESSMENT:   CLINICAL IMPRESSION: The patient has made progress. She has improved functionally per FOTO. He strength has improved with most measures. She reports very little pain at this time in her hip. She is having most of her pain with bed mobility and when she stands for too long. She continues to have significant foot pain which causes her to shift laterally with ambulation. She would like to work on IT trainer. Given her functional gains and potential to improve ability to do steps, the patient would benefit from further skilled therapy 1-2W6.   OBJECTIVE IMPAIRMENTS decreased balance, decreased endurance, difficulty walking, decreased ROM, and decreased strength.    ACTIVITY LIMITATIONS bending, standing, stairs, transfers, and bed mobility   PARTICIPATION LIMITATIONS: shopping and community activity   PERSONAL FACTORS Age, Fitness, Time since onset of injury/illness/exacerbation, and 3+ comorbidities: (see PMH)  are also affecting patient's functional outcome.    REHAB POTENTIAL: Good   CLINICAL DECISION MAKING: Evolving/moderate complexity   EVALUATION COMPLEXITY: Moderate     GOALS: Goals reviewed with patient? Yes   SHORT TERM GOALS: Target date: 08/09/2021 assessed on 7/28 Pt will improve hip flexion and ankle dorsiflexion ROM to [facility] to improve bed mobility and gait mechanics. Baseline: Goal status: no pain with hip flexion >100 degrees achieved    2.  Pt will be able to transfer from supine to seated and from sit-standing without pain or assistance.  Baseline:  Goal status: Improving but still has pain. Needs to work on technique    3.  Pt will improve bil hip abduction strength by 20%. Baseline:  Goal status: Improving but goal not mer      LONG TERM GOALS: Target date: 08/30/2021    Pt will improve TUG score by MCID of 3.4 seconds to  demonstrate a reduced risk for falls. Baseline:  Goal status: not tested    2.  Pt will be able to stand or ambulate 20 minutes with normalized gait mechanics without pain. Baseline:  Goal status: improved but foot limiting gait mechanics    3.  Pt will be fully independent with exercise program to maintain current level of fitness. Baseline:  Goal status: working on HEP      PLAN: PT FREQUENCY: 1-2x/week   PT DURATION: 6 weeks   PLANNED INTERVENTIONS: Therapeutic exercises, Therapeutic activity, Neuromuscular re-education, Balance training, Gait training, Patient/Family education, Joint mobilization, Stair training, Aquatic Therapy, Dry Needling, Cryotherapy, and Moist heat   PLAN FOR NEXT SESSION: Continue STM as indicated. Lateral hip, core, ankle strengthening, gait training. Consider aerobic endurance assessment. Assess tolerance to met pad                     Carolyne Littles PT DPT  08/23/2021

## 2021-09-02 ENCOUNTER — Encounter (HOSPITAL_BASED_OUTPATIENT_CLINIC_OR_DEPARTMENT_OTHER): Payer: Self-pay | Admitting: Physical Therapy

## 2021-09-06 ENCOUNTER — Ambulatory Visit (INDEPENDENT_AMBULATORY_CARE_PROVIDER_SITE_OTHER): Payer: Medicare HMO | Admitting: Obstetrics and Gynecology

## 2021-09-06 ENCOUNTER — Encounter: Payer: Self-pay | Admitting: Obstetrics and Gynecology

## 2021-09-06 VITALS — BP 130/66 | HR 83

## 2021-09-06 DIAGNOSIS — N3281 Overactive bladder: Secondary | ICD-10-CM | POA: Diagnosis not present

## 2021-09-06 DIAGNOSIS — N390 Urinary tract infection, site not specified: Secondary | ICD-10-CM | POA: Diagnosis not present

## 2021-09-06 MED ORDER — MIRABEGRON ER 25 MG PO TB24: 25.0000 mg | ORAL_TABLET | Freq: Every day | ORAL | 5 refills | Status: DC

## 2021-09-06 MED ORDER — ESTRADIOL 10 MCG VA TABS: ORAL_TABLET | VAGINAL | 11 refills | Status: DC

## 2021-09-06 NOTE — Progress Notes (Unsigned)
Urogynecology Return Visit  SUBJECTIVE  History of Present Illness: [name] [name] is a 80 y.o. female seen in follow-up for recurrent urinary tract infections.   Has been using the estrogen cream but unable to really get it inside of the vagina. Currently taking 250mg keflex daily. Has some vaginal burning/ discomfort and unsure if she has an infection.  Has Interstim device. Having more urinary frequency. Voiding once per hour. She did not bring her remote with her today.   Past Medical History: Patient  has a past medical history of Acute non-recurrent pansinusitis (04/20/2021), Acute respiratory failure with hypoxia (HCC) (03/10/2021), Allergy, Anxiety, Arthritis, At risk for allergic reaction to medication (02/04/2021), Blood transfusion without reported diagnosis, Cancer (HCC), Candida infection (04/20/2021), Carotid artery plaque, bilateral (02/27/2018), Cataract, Closed fracture of upper end of humerus (11/03/2017), COPD (chronic obstructive pulmonary disease) (HCC), COVID-19 vaccine series started (07/01/2020), COVID-19 virus infection (03/09/2021), Depression, Encounter for Medicare annual wellness exam (07/01/2020), Encounter to establish care (06/03/2020), Essential hypertension (05/19/2019), Essential hypertension (05/19/2019), Female stress incontinence (02/14/2018), GERD (gastroesophageal reflux disease), History of opioid abuse (HCC) (05/19/2019), Hospital discharge follow-up (03/24/2021), Hyperglycemia (05/19/2019), Hyperlipidemia, Hypertension, Irritation of right eye (12/09/2020), Menopause present (02/11/2018), Need for shingles vaccine (07/01/2020), Osteoporosis, Prediabetes (05/22/2019), Sleep apnea, Subacute cough (03/24/2021), and 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   Past Surgical History: She  has a past surgical history that includes Abdominal hysterectomy (1980); Melanoma excision (Right); Cholecystectomy (1980); Replacement total knee (Bilateral, 2000); and sacral  neuromodulation.   Medications: She has a current medication list which includes the following prescription(s): albuterol, alprazolam, AMBULATORY NON FORMULARY MEDICATION, amlodipine-valsartan-hctz, arthritis pain relieving, atorvastatin, probiotic formula, blood glucose meter kit and supplies, sarna, carvedilol, cephalexin, cetirizine, clonidine, clonidine, dapagliflozin propanediol, diphenhydramine, easymax test, eliquis, estradiol, estradiol, famotidine, ferosul, fluconazole, guaifenesin, hydralazine, lactase, mirtazapine, pantoprazole, paroxetine, and trelegy ellipta.   Allergies: Patient is allergic to azithromycin, bee venom, erythromycin base, metronidazole, nitrofurantoin macrocrystal, penicillins, sulfa antibiotics, cymbalta [duloxetine hcl], ivp dye [iodinated contrast media], penicillin g, codeine, erythromycin, fosfomycin, levofloxacin, nitrofurantoin, and percocet [oxycodone-acetaminophen].   Social History: Patient  reports that she quit smoking about 35 years ago. Her smoking use included cigarettes. She started smoking about 66 years ago. She has never used smokeless tobacco. She reports that she does not currently use alcohol. She reports that she does not use drugs.      OBJECTIVE     Physical Exam: Vitals:   09/06/21 1104  BP: 130/66  Pulse: 83   Gen: No apparent distress, A&O x 3.  Detailed Urogynecologic Evaluation:  Normal external genitalia.  Attempted to place the estring but unable to fit due to short vaginal length (4cm).      ASSESSMENT AND PLAN    [name] is a 80 y.o. with:  No diagnosis found.  - Continue Keflex 250mg daily for UTI prevention.  - Estring not able to be placed due to short vaginal length and she is not successful placing the cream. We discussed an alternative of vaginal tablet. She thinks this may work better than the cream for her. Unsure if the tablet will fall out due to shortened vagina but she wants to try. Rx provided for  Yuvafem 10mg tablets to place twice a week.  - Advised to bring interstim remote with her to next visit so it can be interrogated. In the meantime, prescribed Myrbetriq 25mg daily. Samples provided.   Return 6 weeks  Michelle N Schroeder, MD 

## 2021-09-07 ENCOUNTER — Ambulatory Visit (HOSPITAL_BASED_OUTPATIENT_CLINIC_OR_DEPARTMENT_OTHER): Payer: Medicare HMO | Admitting: Physical Therapy

## 2021-09-07 ENCOUNTER — Encounter: Payer: Self-pay | Admitting: Obstetrics and Gynecology

## 2021-09-22 ENCOUNTER — Ambulatory Visit (HOSPITAL_BASED_OUTPATIENT_CLINIC_OR_DEPARTMENT_OTHER): Payer: Medicare HMO | Admitting: Physical Therapy

## 2021-09-23 DIAGNOSIS — E1142 Type 2 diabetes mellitus with diabetic polyneuropathy: Secondary | ICD-10-CM | POA: Insufficient documentation

## 2021-09-27 ENCOUNTER — Ambulatory Visit (INDEPENDENT_AMBULATORY_CARE_PROVIDER_SITE_OTHER): Payer: Medicare HMO | Admitting: Psychologist

## 2021-09-27 DIAGNOSIS — F330 Major depressive disorder, recurrent, mild: Secondary | ICD-10-CM | POA: Diagnosis not present

## 2021-09-27 DIAGNOSIS — F411 Generalized anxiety disorder: Secondary | ICD-10-CM | POA: Diagnosis not present

## 2021-09-27 NOTE — Progress Notes (Signed)
[facility] Counselor/Therapist Progress Note  Patient ID: [name] [name], MRN: [id number],    Date: 09/27/2021  Time Spent: 02:02 pm to 02:30 pm; total time: 28 minutes   This session was held via phone teletherapy due to the coronavirus risk at this time. The patient consented to phone teletherapy and was located at her home during this session. She is aware it is the responsibility of the patient to secure confidentiality on her end of the session. The provider was in a private home office for the duration of this session. Limits of confidentiality were discussed with the patient.   Treatment Type: Individual Therapy  Reported Symptoms: Less anxiety and depression  Mental Status Exam: Appearance:  NA     Behavior: Appropriate  Motor: Normal  Speech/Language:  Clear and Coherent  Affect: Appropriate  Mood: normal  Thought process: normal  Thought content:   WNL  Sensory/Perceptual disturbances:   WNL  Orientation: oriented to person, place, and time/date  Attention: Good  Concentration: Good  Memory: WNL  Fund of knowledge:  Good  Insight:   Good  Judgment:  Good  Impulse Control: Good   Risk Assessment: Danger to Self:  No Self-injurious Behavior: No Danger to Others: No Duty to Warn:no Physical Aggression / Violence:No  Access to Firearms a concern: No  Gang Involvement:No   Subjective: Beginning the session, patient described herself as doing very well and processed what has been going well for her. After reviewing the treatment plan, patient spent the majority of the time reflecting on ways to get her granddaughters and their children to come visit her more consistently. She also reflected on the idea of sitting at a different table during lunch to socialize with others. She was agreeable to the homework and following up. She denied suicidal and homicidal ideation.    Interventions:  Worked on developing a therapeutic relationship with the patient  using active listening and reflective statements. Provided emotional support using empathy and validation. Reviewed the treatment plan with the patient. Reviewed events since the intake. Praised the patient for doing better and explored what has assisted the patient. Identified goals for the session. Explored potential barriers to her granddaughters visiting her. Assisted in problem solving. Used socratic questions to assist the patient gain insight into self. Explored barriers to switching tables at lunch. Processed how to get socialize needs met at the location where patient resides. Processed thoughts and emotions. Normalized and validated thoughts. Provided empathic statements. Assigned homework. Assessed for suicidal and homicidal ideation.   Homework: Reach out to grandchildren about them visiting her; sit at a different table for lunch  Next Session: Provide emotional support and review homework  Diagnosis: F41.1 generalized anxiety disorder and F33.0 major depressive affective disorder, recurrent, mild   Plan:   Goals Alleviate depressive symptoms Recognize, accept, and cope with depressive feelings Develop healthy thinking patterns Develop healthy interpersonal relationships Reduce overall frequency, intensity, and duration of anxiety Stabilize anxiety level wile increasing ability to function Enhance ability to effectively cope with full variety of stressors Learn and implement coping skills that result in a reduction of anxiety   Objectives target date for all objectives is 08/31/2022 Verbalize an understanding of the cognitive, physiological, and behavioral components of anxiety Learning and implement calming skills to reduce overall anxiety Verbalize an understanding of the role that cognitive biases play in excessive irrational worry and persistent anxiety symptoms Identify, challenge, and replace based fearful talk Learn and implement problem solving strategies Identify and  engage in pleasant activities Learning and implement personal and interpersonal skills to reduce anxiety and improve interpersonal relationships Learn to accept limitations in life and commit to tolerating, rather than avoiding, unpleasant emotions while accomplishing meaningful goals Identify major life conflicts from the past and present that form the basis for present anxiety Maintain involvement in work, family, and social activities Reestablish a consistent sleep-wake cycle Cooperate with a medical evaluation  Cooperate with a medication evaluation by a physician Verbalize an accurate understanding of depression Verbalize an understanding of the treatment Identify and replace thoughts that support depression Learn and implement behavioral strategies Verbalize an understanding and resolution of current interpersonal problems Learn and implement problem solving and decision making skills Learn and implement conflict resolution skills to resolve interpersonal problems Verbalize an understanding of healthy and unhealthy emotions verbalize insight into how past relationships may be influence current experiences with depression Use mindfulness and acceptance strategies and increase value based behavior  Increase hopeful statements about the future.  Interventions Engage the patient in behavioral activation Use instruction, modeling, and role-playing to build the client's general social, communication, and/or conflict resolution skills Use Acceptance and Commitment Therapy to help client accept uncomfortable realities in order to accomplish value-consistent goals Reinforce the client's insight into the role of his/her past emotional pain and present anxiety  Support the client in following through with work, family, and social activities Teach and implement sleep hygiene practices  Refer the patient to a physician for a psychotropic medication consultation Monito the clint's psychotropic  medication compliance Discuss how anxiety typically involves excessive worry, various bodily expressions of tension, and avoidance of what is threatening that interact to maintain the problem  Teach the patient relaxation skills Assign the patient homework Discuss examples demonstrating that unrealistic worry overestimates the probability of threats and underestimates patient's ability  Assist the patient in analyzing his or her worries Help patient understand that avoidance is reinforcing  Consistent with treatment model, discuss how change in cognitive, behavioral, and interpersonal can help client alleviate depression CBT Behavioral activation help the client explore the relationship, nature of the dispute,  Help the client develop new interpersonal skills and relationships Conduct Problem solving therapy Teach conflict resolution skills Use a process-experiential approach Conduct TLDP Conduct ACT Evaluate need for psychotropic medication Monitor adherence to medication   The patient and clinician reviewed the treatment plan on 09/27/2021. The patient approved of the treatment plan.    Conception Chancy, PsyD

## 2021-09-27 NOTE — Progress Notes (Signed)
                [name] Segers, PsyD 

## 2021-09-30 ENCOUNTER — Telehealth: Payer: Self-pay

## 2021-09-30 ENCOUNTER — Telehealth (HOSPITAL_BASED_OUTPATIENT_CLINIC_OR_DEPARTMENT_OTHER): Payer: Self-pay

## 2021-09-30 NOTE — Telephone Encounter (Signed)
Patient called she is having severe diarrhea, she is using imodium OTC with no help. She would like you call her something in she see's the GI in September. Please call her at [phone].

## 2021-09-30 NOTE — Telephone Encounter (Signed)
The patient called to get help with her monitor. [employer] is sending her a new handheld. She will receive it in 7-10 business days.

## 2021-10-03 ENCOUNTER — Ambulatory Visit: Payer: Medicare HMO | Admitting: Internal Medicine

## 2021-10-04 ENCOUNTER — Ambulatory Visit (HOSPITAL_BASED_OUTPATIENT_CLINIC_OR_DEPARTMENT_OTHER): Payer: Medicare HMO | Attending: Nurse Practitioner | Admitting: Physical Therapy

## 2021-10-04 DIAGNOSIS — R2689 Other abnormalities of gait and mobility: Secondary | ICD-10-CM | POA: Diagnosis present

## 2021-10-04 DIAGNOSIS — M25551 Pain in right hip: Secondary | ICD-10-CM | POA: Diagnosis present

## 2021-10-05 ENCOUNTER — Encounter (HOSPITAL_BASED_OUTPATIENT_CLINIC_OR_DEPARTMENT_OTHER): Payer: Self-pay | Admitting: Physical Therapy

## 2021-10-05 NOTE — Therapy (Signed)
Therapist)               PT End of Session - 10/04/21 1150     Visit Number 8    Number of Visits 19    Date for PT Re-Evaluation 10/14/21    Authorization Type [employer] Medicare ( no [place])    PT Start Time 1145    PT Stop Time 1228    PT Time Calculation (min) 43 min    Activity Tolerance Patient tolerated treatment well    Behavior During Therapy WFL for tasks assessed/performed                  OUTPATIENT PHYSICAL THERAPY LOWER EXTREMITY Treatment      Patient Name: [name] [name] MRN: [id number] DOB:November 29, 1940, 81 y.o., female Today's Date: 07/19/2021           Past Medical History:  Diagnosis Date   Acute non-recurrent pansinusitis 04/20/2021   Acute respiratory failure with hypoxia ([place]) 03/10/2021   Allergy     Anxiety     Arthritis     At risk for allergic reaction to medication 02/04/2021   Blood transfusion without reported diagnosis     Cancer ([place])      Melanoma on Great toe   Candida infection 04/20/2021   Carotid artery plaque, 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   Cataract     Closed fracture of upper end of humerus 11/03/2017   COPD (chronic obstructive pulmonary disease) ([place])     COVID-19 vaccine series started 07/01/2020   COVID-19 virus infection 03/09/2021   Depression     Encounter for Medicare annual wellness exam 07/01/2020   Encounter to establish care 06/03/2020   Essential hypertension 05/19/2019   Essential hypertension 05/19/2019   Female stress incontinence 02/14/2018   GERD (gastroesophageal reflux disease)     History of opioid abuse ([place]) 05/19/2019   Hospital discharge follow-up 03/24/2021   Hyperglycemia 05/19/2019   Hyperlipidemia     Hypertension     Irritation of right eye 12/09/2020   Menopause present 02/11/2018   Need for shingles vaccine 07/01/2020   Osteoporosis     Prediabetes 05/22/2019   Sleep apnea     Subacute cough 03/24/2021   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         Past Surgical History:  Procedure Laterality Date    [place] Right      Great Toe   REPLACEMENT TOTAL KNEE Bilateral 2000   sacral neuromodulation            Patient Active Problem List    Diagnosis Date Noted   Gait instability 07/05/2021   PAD (peripheral artery disease) ([place]) 05/10/2021   Lymphadenopathy, inguinal 05/05/2021   Type 2 diabetes mellitus with hyperglycemia, without long-term current use of insulin ([place]) 03/24/2021   Hypertension associated with diabetes ([place]) 02/04/2021   Lactose intolerance 11/11/2020   Pulmonary nodules 10/21/2020   Neuropathy 07/01/2020   Encounter for medication management 06/03/2020   Coronary artery disease involving native heart without angina pectoris 12/18/2019   History of malignant melanoma 12/18/2019   Basal cell carcinoma of skin 12/18/2019   History of surgery for cerebral aneurysm 12/18/2019   Hyperlipidemia associated with type 2 diabetes mellitus ([place]) 05/22/2019   Adjustment disorder with mixed anxiety and depressed mood 05/19/2019   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 05/19/2019   Bilateral carotid artery occlusion 05/19/2019  Anemia 05/19/2019   Bipolar disorder ([place]) 05/19/2019   Chronic obstructive lung disease ([place]) 05/19/2019   Chronic pain syndrome 05/19/2019   Gastroesophageal reflux disease without esophagitis 05/19/2019   Generalized anxiety disorder 05/19/2019   Hypertensive heart disease without congestive heart failure 05/19/2019   Obstructive sleep apnea syndrome 05/19/2019   Polyp of colon 05/19/2019   Depression, major, single episode, moderate ([place]) 05/19/2019   Spinal stenosis in cervical region 05/19/2019   History of TIA (transient ischemic attack) 05/19/2019   Mixed urinary incontinence due to female genital prolapse 05/19/2019   Tricuspid valve disorders, 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   Panic disorder with agoraphobia 02/22/2018    Diverticulosis of large intestine 02/14/2018   Localized, primary osteoarthritis of shoulder region 02/14/2018   Mitral valve regurgitation 02/14/2018   Grade I internal hemorrhoids 02/14/2018   Bilateral carpal tunnel syndrome 02/11/2018   Morbid obesity ([place]) 02/11/2018   Nondependent opioid abuse in remission ([place]) 02/11/2018      PCP: Orma Render, NP   REFERRING PROVIDER: Orma Render, NP   REFERRING DIAG:  R26.81 (ICD-10-CM) - Gait instability  Z74.1 (ICD-10-CM) - Dependent for walking      THERAPY DIAG:  Other abnormalities of gait and mobility   Pain in right hip   Rationale for Evaluation and Treatment Rehabilitation   ONSET DATE: Jan 2023   SUBJECTIVE:    SUBJECTIVE STATEMENT: The patient comes in after a layoff with therapy. She has in that time been to the foot MD who diagnosed her with neuropathy. She has been on Gabapentin which has helped her foot pain. She comes back to therapy hoping to work on steps.  PERTINENT HISTORY: Bil femur fractures, T2DM, CAD, cancer (melanoma) and subsequent R great toe amputation, anxiety   PAIN:  Are you having pain? Yes: NPRS scale: 2/10 Pain location: right foot  Pain description: aching  Aggravating factors: Standing  Relieving factors: rest    PRECAUTIONS: None   WEIGHT BEARING RESTRICTIONS No   FALLS:  Has patient fallen in last 6 months? No   LIVING ENVIRONMENT: Lives with: lives in an assisted living facility Lives in: House/apartment Stairs: No Has following equipment at home: Gilford Rile - 4 wheeled   OCCUPATION: Retired PTA   PLOF: Independent   PATIENT GOALS: Wants to improve walking/fitness and be able to visit her sister in [place] in October.     OBJECTIVE:    DIAGNOSTIC FINDINGS:  None   PATIENT SURVEYS:  FOTO 44                        Expected 45 in 11 visits   COGNITION:           Overall cognitive status: Within functional limits for tasks assessed                           SENSATION: Impaired - pt reports decreased sensation in bil feet from T2DM neuropathy     POSTURE: rounded shoulders, forward head, and flexed trunk    PALPATION: Tender to palpation of glute med/min, piriformis, IT band   LOWER EXTREMITY ROM:   Active ROM Right eval Left eval  Hip flexion Limited, concordant pain Limited  Hip extension      Hip abduction      Hip adduction      Hip internal rotation Painful    Hip external rotation Painful    Knee flexion  Knee extension Painful    Ankle dorsiflexion Limited Limited  Ankle plantarflexion      Ankle inversion      Ankle eversion       (Blank rows = not tested)   LOWER EXTREMITY MMT:   MMT Right eval Left eval right left  Hip flexion 26.2 36.1 25.7 33.8  Hip extension        Hip abduction 21 27.1 not able to maintain hold  25.7 27  Hip adduction        Hip internal rotation        Hip external rotation        Knee flexion        Knee extension 23 16.5 30.1 29.5  Ankle dorsiflexion        Ankle plantarflexion        Ankle inversion        Ankle eversion         (Blank rows = not tested)       FUNCTIONAL TESTS:  Timed up and go (TUG): 21sec -Pt required use of UE to get out of chair     TODAY'S TREATMENT: 8/30 Seated bilateral hip abduction red 3x15  Seated LAQ 3x10 each leg  Seated march 2x15   Step up 2x10 4 inch  Lateral step up 2x10 4 inch  Step down 3x10 2 inch   Standing weight shift forward x20      7/27  Reviewed goals / test and measures   LTR 2x10 with cuing for technique  Bridge 3x10 with cuing for amplitude of movement   Step up 2x10 2 inch  Lateral step up 2x10 2 inch   There-act: worked on technique with supine to sit and sit to supine transfer.     7/25 Bilateral er 2x10 yellow  Bilateral horizontal abduction 2x10 yellow  Bilateral flexion yellow 2x10   Hip abduction yellow 3x10  Seated hip flexion 3x10 yellow   Trigger Point Dry-Needling  Treatment  instructions: Expect mild to moderate muscle soreness. S/S of pneumothorax if dry needled over a lung field, and to seek immediate medical attention should they occur. Patient verbalized understanding of these instructions and education.  Patient Consent Given: Yes Education handout provided: Yes Muscles treated: 3 spots in the  right glut med in side lying using a .30x75 needle  Electrical stimulation performed: No Parameters: N/A Treatment response/outcome: good twitch  Manual - STM and TrP release of the R glute med/TFL, STM of plantar fascia w/ grade III/IV first ray mobs        7/18 Warm-up: NuStep, L1 42mn 3x6 sit to stand, speed/power focus without UE use S/L clamshells, 2x8 bil  Placed met pad in her shoe. Worked on gait with the met pad. Improved gait pattern noted   Trigger Point Dry-Needling  Treatment instructions: Expect mild to moderate muscle soreness. S/S of pneumothorax if dry needled over a lung field, and to seek immediate medical attention should they occur. Patient verbalized understanding of these instructions and education.  Patient Consent Given: Yes Education handout provided: Yes Muscles treated: 3 spots in the  right glut med in side lying using a .30x75 needle  Electrical stimulation performed: No Parameters: N/A Treatment response/outcome: good twitch  Manual - STM and TrP release of the R glute med/TFL, STM of plantar fascia w/ grade III/IV first ray mobs        7/13 Warm-up: NuStep, L1 562m LAQ - 3x10, 2x5lb Mini squats to table -  3x8 Balance exercises - 2x30sec each -Marching with UE support for stability -Lateral weight shifts -Semi-tandem weight shifts -Narrow base, 1x eyes closed   Reviewed use of Morton's Met pad to reduce metatarsal force on her foot. Her foot is likely effecting her ability to ambulate.       PATIENT EDUCATION:  Education details: HEP, exercise, safety with transfers Person educated: Patient Education  method: Explanation Education comprehension: verbalized understanding     HOME EXERCISE PROGRAM: -Gentle stretching           -Knee-to-chest           -Piriformis stretch -Self-release of trigger points   ASSESSMENT:   CLINICAL IMPRESSION: The patients hip is doing well. She has been able to maintain her pain free level depsite not having PT. Her foot pain has improved with medication. She comes back in with the gola of being able to go up and down her sons steps safely. We began stair training today. She tolerated well. She had no increase in pain in her hip or foot.  OBJECTIVE IMPAIRMENTS decreased balance, decreased endurance, difficulty walking, decreased ROM, and decreased strength.    ACTIVITY LIMITATIONS bending, standing, stairs, transfers, and bed mobility   PARTICIPATION LIMITATIONS: shopping and community activity   PERSONAL FACTORS Age, Fitness, Time since onset of injury/illness/exacerbation, and 3+ comorbidities: (see PMH)  are also affecting patient's functional outcome.    REHAB POTENTIAL: Good   CLINICAL DECISION MAKING: Evolving/moderate complexity   EVALUATION COMPLEXITY: Moderate     GOALS: Goals reviewed with patient? Yes   SHORT TERM GOALS: Target date: 08/09/2021 assessed on 7/28 Pt will improve hip flexion and ankle dorsiflexion ROM to [facility] to improve bed mobility and gait mechanics. Baseline: Goal status: no pain with hip flexion >100 degrees achieved    2.  Pt will be able to transfer from supine to seated and from sit-standing without pain or assistance.  Baseline:  Goal status: Improving but still has pain. Needs to work on technique    3.  Pt will improve bil hip abduction strength by 20%. Baseline:  Goal status: Improving but goal not mer      LONG TERM GOALS: Target date: 08/30/2021    Pt will improve TUG score by MCID of 3.4 seconds to demonstrate a reduced risk for falls. Baseline:  Goal status: not tested    2.  Pt will be able to  stand or ambulate 20 minutes with normalized gait mechanics without pain. Baseline:  Goal status: improved but foot limiting gait mechanics    3.  Pt will be fully independent with exercise program to maintain current level of fitness. Baseline:  Goal status: working on HEP      PLAN: PT FREQUENCY: 1-2x/week   PT DURATION: 6 weeks   PLANNED INTERVENTIONS: Therapeutic exercises, Therapeutic activity, Neuromuscular re-education, Balance training, Gait training, Patient/Family education, Joint mobilization, Stair training, Aquatic Therapy, Dry Needling, Cryotherapy, and Moist heat   PLAN FOR NEXT SESSION: Continue STM as indicated. Lateral hip, core, ankle strengthening, gait training. Consider aerobic endurance assessment. Assess tolerance to met pad                     Carolyne Littles PT DPT  08/23/2021

## 2021-10-06 ENCOUNTER — Ambulatory Visit (HOSPITAL_BASED_OUTPATIENT_CLINIC_OR_DEPARTMENT_OTHER): Payer: Medicare HMO | Admitting: Physical Therapy

## 2021-10-06 ENCOUNTER — Ambulatory Visit: Payer: Medicare HMO | Admitting: Pulmonary Disease

## 2021-10-06 ENCOUNTER — Encounter (HOSPITAL_BASED_OUTPATIENT_CLINIC_OR_DEPARTMENT_OTHER): Payer: Self-pay | Admitting: Physical Therapy

## 2021-10-06 DIAGNOSIS — R2689 Other abnormalities of gait and mobility: Secondary | ICD-10-CM

## 2021-10-06 DIAGNOSIS — M25551 Pain in right hip: Secondary | ICD-10-CM

## 2021-10-06 NOTE — Therapy (Signed)
Therapist)               PT End of Session - 10/06/21 1435     Visit Number 9    Number of Visits 19    Date for PT Re-Evaluation 10/14/21    Authorization Type [employer] Medicare ( no [place])    PT Start Time 1430    PT Stop Time 1512    PT Time Calculation (min) 42 min    Activity Tolerance Patient tolerated treatment well    Behavior During Therapy WFL for tasks assessed/performed                  OUTPATIENT PHYSICAL THERAPY LOWER EXTREMITY Treatment      Patient Name: [name] [name] MRN: [id number] DOB:[birth year], 81 y.o., female Today's Date: 07/19/2021           Past Medical History:  Diagnosis Date   Acute non-recurrent pansinusitis 04/20/2021   Acute respiratory failure with hypoxia ([place]) 03/10/2021   Allergy     Anxiety     Arthritis     At risk for allergic reaction to medication 02/04/2021   Blood transfusion without reported diagnosis     Cancer ([place])      Melanoma on Great toe   Candida infection 04/20/2021   Carotid artery plaque, 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   Cataract     Closed fracture of upper end of humerus 11/03/2017   COPD (chronic obstructive pulmonary disease) ([place])     COVID-19 vaccine series started 07/01/2020   COVID-19 virus infection 03/09/2021   Depression     Encounter for Medicare annual wellness exam 07/01/2020   Encounter to establish care 06/03/2020   Essential hypertension 05/19/2019   Essential hypertension 05/19/2019   Female stress incontinence 02/14/2018   GERD (gastroesophageal reflux disease)     History of opioid abuse ([place]) 05/19/2019   Hospital discharge follow-up 03/24/2021   Hyperglycemia 05/19/2019   Hyperlipidemia     Hypertension     Irritation of right eye 12/09/2020   Menopause present 02/11/2018   Need for shingles vaccine 07/01/2020   Osteoporosis     Prediabetes 05/22/2019   Sleep apnea     Subacute cough 03/24/2021   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         Past Surgical History:  Procedure Laterality Date    [place] Right      Great Toe   REPLACEMENT TOTAL KNEE Bilateral 2000   sacral neuromodulation            Patient Active Problem List    Diagnosis Date Noted   Gait instability 07/05/2021   PAD (peripheral artery disease) ([place]) 05/10/2021   Lymphadenopathy, inguinal 05/05/2021   Type 2 diabetes mellitus with hyperglycemia, without long-term current use of insulin ([place]) 03/24/2021   Hypertension associated with diabetes ([place]) 02/04/2021   Lactose intolerance 11/11/2020   Pulmonary nodules 10/21/2020   Neuropathy 07/01/2020   Encounter for medication management 06/03/2020   Coronary artery disease involving native heart without angina pectoris 12/18/2019   History of malignant melanoma 12/18/2019   Basal cell carcinoma of skin 12/18/2019   History of surgery for cerebral aneurysm 12/18/2019   Hyperlipidemia associated with type 2 diabetes mellitus ([name]) 05/22/2019   Adjustment disorder with mixed anxiety and depressed mood 05/19/2019   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 05/19/2019   Bilateral carotid artery occlusion 05/19/2019  Anemia 05/19/2019   Bipolar disorder ([place]) 05/19/2019   Chronic obstructive lung disease ([place]) 05/19/2019   Chronic pain syndrome 05/19/2019   Gastroesophageal reflux disease without esophagitis 05/19/2019   Generalized anxiety disorder 05/19/2019   Hypertensive heart disease without congestive heart failure 05/19/2019   Obstructive sleep apnea syndrome 05/19/2019   Polyp of colon 05/19/2019   Depression, major, single episode, moderate ([place]) 05/19/2019   Spinal stenosis in cervical region 05/19/2019   History of TIA (transient ischemic attack) 05/19/2019   Mixed urinary incontinence due to female genital prolapse 05/19/2019   Tricuspid valve disorders, 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   Panic disorder with agoraphobia 02/22/2018    Diverticulosis of large intestine 02/14/2018   Localized, primary osteoarthritis of shoulder region 02/14/2018   Mitral valve regurgitation 02/14/2018   Grade I internal hemorrhoids 02/14/2018   Bilateral carpal tunnel syndrome 02/11/2018   Morbid obesity ([place]) 02/11/2018   Nondependent opioid abuse in remission ([place]) 02/11/2018      PCP: Orma Render, NP   REFERRING PROVIDER: Orma Render, NP   REFERRING DIAG:  R26.81 (ICD-10-CM) - Gait instability  Z74.1 (ICD-10-CM) - Dependent for walking      THERAPY DIAG:  Other abnormalities of gait and mobility   Pain in right hip   Rationale for Evaluation and Treatment Rehabilitation   ONSET DATE: Jan 2023   SUBJECTIVE:    SUBJECTIVE STATEMENT: The patient had no major pain after her treatment last time.   PERTINENT HISTORY: Bil femur fractures, T2DM, CAD, cancer (melanoma) and subsequent R great toe amputation, anxiety   PAIN:  Are you having pain? Yes: NPRS scale: 2/10 Pain location: right foot  Pain description: aching  Aggravating factors: Standing  Relieving factors: rest    PRECAUTIONS: None   WEIGHT BEARING RESTRICTIONS No   FALLS:  Has patient fallen in last 6 months? No   LIVING ENVIRONMENT: Lives with: lives in an assisted living facility Lives in: House/apartment Stairs: No Has following equipment at home: Gilford Rile - 4 wheeled   OCCUPATION: Retired PTA   PLOF: Independent   PATIENT GOALS: Wants to improve walking/fitness and be able to visit her sister in [place] in October.     OBJECTIVE:    DIAGNOSTIC FINDINGS:  None   PATIENT SURVEYS:  FOTO 44                        Expected 70 in 11 visits   COGNITION:           Overall cognitive status: Within functional limits for tasks assessed                          SENSATION: Impaired - pt reports decreased sensation in bil feet from T2DM neuropathy     POSTURE: rounded shoulders, forward head, and flexed trunk    PALPATION: Tender to  palpation of glute med/min, piriformis, IT band   LOWER EXTREMITY ROM:   Active ROM Right eval Left eval  Hip flexion Limited, concordant pain Limited  Hip extension      Hip abduction      Hip adduction      Hip internal rotation Painful    Hip external rotation Painful    Knee flexion      Knee extension Painful    Ankle dorsiflexion Limited Limited  Ankle plantarflexion      Ankle inversion      Ankle eversion       (  Blank rows = not tested)   LOWER EXTREMITY MMT:   MMT Right eval Left eval right left  Hip flexion 26.2 36.1 25.7 33.8  Hip extension        Hip abduction 21 27.1 not able to maintain hold  25.7 27  Hip adduction        Hip internal rotation        Hip external rotation        Knee flexion        Knee extension 23 16.5 30.1 29.5  Ankle dorsiflexion        Ankle plantarflexion        Ankle inversion        Ankle eversion         (Blank rows = not tested)       FUNCTIONAL TESTS:  Timed up and go (TUG): 21sec -Pt required use of UE to get out of chair     TODAY'S TREATMENT: 8/31 Seated Shoulder flexion 2x10 1lbs  Seated Shoulder Y's 2x10 1lbs  Biceps curl 2x10 1lb   Row 2x10 yellow  Shoulder extension yellow 2x10   Step up 2x10 4 inch  Lateral step up 2x10 2 inch   8/29 Seated bilateral hip abduction red 3x15  Seated LAQ 3x10 each leg  Seated march 2x15   Step up 2x10 4 inch  Lateral step up 2x10 4 inch  Step down 3x10 2 inch   Standing weight shift forward x20      7/27  Reviewed goals / test and measures   LTR 2x10 with cuing for technique  Bridge 3x10 with cuing for amplitude of movement   Step up 2x10 2 inch  Lateral step up 2x10 2 inch   There-act: worked on technique with supine to sit and sit to supine transfer.          PATIENT EDUCATION:  Education details: HEP, exercise, safety with transfers Person educated: Patient Education method: Explanation Education comprehension: verbalized understanding      HOME EXERCISE PROGRAM: -Gentle stretching           -Knee-to-chest           -Piriformis stretch -Self-release of trigger points   ASSESSMENT:   CLINICAL IMPRESSION: Therapy worked on UE and core strengthening. She reported some fatigue but otherwise tolerated. Well. She tolerated steps well. Going up and down steps continues to be our top priority. We will progress as tolerated.  OBJECTIVE IMPAIRMENTS decreased balance, decreased endurance, difficulty walking, decreased ROM, and decreased strength.    ACTIVITY LIMITATIONS bending, standing, stairs, transfers, and bed mobility   PARTICIPATION LIMITATIONS: shopping and community activity   PERSONAL FACTORS Age, Fitness, Time since onset of injury/illness/exacerbation, and 3+ comorbidities: (see PMH)  are also affecting patient's functional outcome.    REHAB POTENTIAL: Good   CLINICAL DECISION MAKING: Evolving/moderate complexity   EVALUATION COMPLEXITY: Moderate     GOALS: Goals reviewed with patient? Yes   SHORT TERM GOALS: Target date: 08/09/2021 assessed on 7/28 Pt will improve hip flexion and ankle dorsiflexion ROM to [facility] to improve bed mobility and gait mechanics. Baseline: Goal status: no pain with hip flexion >100 degrees achieved    2.  Pt will be able to transfer from supine to seated and from sit-standing without pain or assistance.  Baseline:  Goal status: Improving but still has pain. Needs to work on technique    3.  Pt will improve bil hip abduction strength by 20%. Baseline:  Goal  status: Improving but goal not mer      LONG TERM GOALS: Target date: 08/30/2021    Pt will improve TUG score by MCID of 3.4 seconds to demonstrate a reduced risk for falls. Baseline:  Goal status: not tested    2.  Pt will be able to stand or ambulate 20 minutes with normalized gait mechanics without pain. Baseline:  Goal status: improved but foot limiting gait mechanics    3.  Pt will be fully independent with exercise  program to maintain current level of fitness. Baseline:  Goal status: working on HEP      PLAN: PT FREQUENCY: 1-2x/week   PT DURATION: 6 weeks   PLANNED INTERVENTIONS: Therapeutic exercises, Therapeutic activity, Neuromuscular re-education, Balance training, Gait training, Patient/Family education, Joint mobilization, Stair training, Aquatic Therapy, Dry Needling, Cryotherapy, and Moist heat   PLAN FOR NEXT SESSION: Continue STM as indicated. Lateral hip, core, ankle strengthening, gait training. Consider aerobic endurance assessment. Assess tolerance to met pad                     Carolyne Littles PT DPT  08/23/2021

## 2021-10-07 ENCOUNTER — Encounter (HOSPITAL_BASED_OUTPATIENT_CLINIC_OR_DEPARTMENT_OTHER): Payer: Self-pay | Admitting: Physical Therapy

## 2021-10-11 ENCOUNTER — Ambulatory Visit (INDEPENDENT_AMBULATORY_CARE_PROVIDER_SITE_OTHER): Payer: Medicare HMO | Admitting: Psychologist

## 2021-10-11 ENCOUNTER — Ambulatory Visit (HOSPITAL_BASED_OUTPATIENT_CLINIC_OR_DEPARTMENT_OTHER): Payer: Medicare HMO | Attending: Nurse Practitioner | Admitting: Physical Therapy

## 2021-10-11 DIAGNOSIS — R2689 Other abnormalities of gait and mobility: Secondary | ICD-10-CM | POA: Insufficient documentation

## 2021-10-11 DIAGNOSIS — F411 Generalized anxiety disorder: Secondary | ICD-10-CM | POA: Diagnosis not present

## 2021-10-11 DIAGNOSIS — F330 Major depressive disorder, recurrent, mild: Secondary | ICD-10-CM

## 2021-10-11 DIAGNOSIS — M25551 Pain in right hip: Secondary | ICD-10-CM | POA: Insufficient documentation

## 2021-10-11 NOTE — Progress Notes (Signed)
[facility] Counselor/Therapist Progress Note  Patient ID: [name] [name], MRN: [id number],    Date: 10/11/2021  Time Spent: 01:01 pm to 01:25 pm; total time: 24 minutes   This session was held via phone teletherapy due to the coronavirus risk at this time. The patient consented to phone teletherapy and was located at her home during this session. She is aware it is the responsibility of the patient to secure confidentiality on her end of the session. The provider was in a private home office for the duration of this session. Limits of confidentiality were discussed with the patient.   Treatment Type: Individual Therapy  Reported Symptoms: Some anxiety related to talking with granddaughter about visiting   Mental Status Exam: Appearance:  NA     Behavior: Appropriate  Motor: Normal  Speech/Language:  Clear and Coherent  Affect: Appropriate  Mood: normal  Thought process: normal  Thought content:   WNL  Sensory/Perceptual disturbances:   WNL  Orientation: oriented to person, place, and time/date  Attention: Good  Concentration: Good  Memory: WNL  Fund of knowledge:  Good  Insight:   Good  Judgment:  Good  Impulse Control: Good   Risk Assessment: Danger to Self:  No Self-injurious Behavior: No Danger to Others: No Duty to Warn:no Physical Aggression / Violence:No  Access to Firearms a concern: No  Gang Involvement:No   Subjective: Beginning the session, patient described herself as well as she had some family come visit her over the weekend, which she greatly appreciated. From there, she voiced that her granddaughter has not visited. Patient spent part of the session reflecting on how she can approach her granddaughter about getting her granddaughter to come visit. She processed thoughts and emotions. She also processed how it is hard to ask others to visit her as she does not want to bother them. She also voiced that it is hard to request for people to meet her  needs as she is  used to meeting the needs of others.She also has a difficult time with accountability. She was agreeable to the homework and following up. She denied suicidal and homicidal ideation.    Interventions:  Worked on developing a therapeutic relationship with the patient using active listening and reflective statements. Provided emotional support using empathy and validation. Reviewed the treatment plan with the patient. Used summary statements. Reflected on the events that have occurred since the last session. Praised the patient for having family come visit her. Processed emotions about family coming to visit her. Validated emotions related to the situation with her granddaughter. Used socractic questions to assist the patient gain insight into self. Challenged some of the thoughts expressed. Assisted in problem solving. Identified several different themes that the patient expressed. Validated those themes. Processed different emotions expressed. Explored ways that patient could express her needs to those around her. Provided empathic statements. Assigned homework. Assessed for suicidal and homicidal ideation.   Homework: Reach out to granddaughter about arranging for her to visit  Next Session: Provide emotional support and review homework  Diagnosis: F41.1 generalized anxiety disorder and F33.0 major depressive affective disorder, recurrent, mild   Plan:   Goals Alleviate depressive symptoms Recognize, accept, and cope with depressive feelings Develop healthy thinking patterns Develop healthy interpersonal relationships Reduce overall frequency, intensity, and duration of anxiety Stabilize anxiety level wile increasing ability to function Enhance ability to effectively cope with full variety of stressors Learn and implement coping skills that result in a reduction of anxiety  Objectives target date for all objectives is 08/31/2022 Verbalize an understanding of the cognitive,  physiological, and behavioral components of anxiety Learning and implement calming skills to reduce overall anxiety Verbalize an understanding of the role that cognitive biases play in excessive irrational worry and persistent anxiety symptoms Identify, challenge, and replace based fearful talk Learn and implement problem solving strategies Identify and engage in pleasant activities Learning and implement personal and interpersonal skills to reduce anxiety and improve interpersonal relationships Learn to accept limitations in life and commit to tolerating, rather than avoiding, unpleasant emotions while accomplishing meaningful goals Identify major life conflicts from the past and present that form the basis for present anxiety Maintain involvement in work, family, and social activities Reestablish a consistent sleep-wake cycle Cooperate with a medical evaluation  Cooperate with a medication evaluation by a physician Verbalize an accurate understanding of depression Verbalize an understanding of the treatment Identify and replace thoughts that support depression Learn and implement behavioral strategies Verbalize an understanding and resolution of current interpersonal problems Learn and implement problem solving and decision making skills Learn and implement conflict resolution skills to resolve interpersonal problems Verbalize an understanding of healthy and unhealthy emotions verbalize insight into how past relationships may be influence current experiences with depression Use mindfulness and acceptance strategies and increase value based behavior  Increase hopeful statements about the future.  Interventions Engage the patient in behavioral activation Use instruction, modeling, and role-playing to build the client's general social, communication, and/or conflict resolution skills Use Acceptance and Commitment Therapy to help client accept uncomfortable realities in order to accomplish  value-consistent goals Reinforce the client's insight into the role of his/her past emotional pain and present anxiety  Support the client in following through with work, family, and social activities Teach and implement sleep hygiene practices  Refer the patient to a physician for a psychotropic medication consultation Monito the clint's psychotropic medication compliance Discuss how anxiety typically involves excessive worry, various bodily expressions of tension, and avoidance of what is threatening that interact to maintain the problem  Teach the patient relaxation skills Assign the patient homework Discuss examples demonstrating that unrealistic worry overestimates the probability of threats and underestimates patient's ability  Assist the patient in analyzing his or her worries Help patient understand that avoidance is reinforcing  Consistent with treatment model, discuss how change in cognitive, behavioral, and interpersonal can help client alleviate depression CBT Behavioral activation help the client explore the relationship, nature of the dispute,  Help the client develop new interpersonal skills and relationships Conduct Problem solving therapy Teach conflict resolution skills Use a process-experiential approach Conduct TLDP Conduct ACT Evaluate need for psychotropic medication Monitor adherence to medication   The patient and clinician reviewed the treatment plan on 09/27/2021. The patient approved of the treatment plan.    Conception Chancy, PsyD

## 2021-10-12 ENCOUNTER — Encounter (HOSPITAL_BASED_OUTPATIENT_CLINIC_OR_DEPARTMENT_OTHER): Payer: Self-pay | Admitting: Physical Therapy

## 2021-10-12 ENCOUNTER — Telehealth (HOSPITAL_BASED_OUTPATIENT_CLINIC_OR_DEPARTMENT_OTHER): Payer: Self-pay | Admitting: Physical Therapy

## 2021-10-12 NOTE — Telephone Encounter (Signed)
Therapy contacted patient regarding No-show appointment. Patient reminded of her next visit.

## 2021-10-13 ENCOUNTER — Encounter (HOSPITAL_BASED_OUTPATIENT_CLINIC_OR_DEPARTMENT_OTHER): Payer: Self-pay | Admitting: Physical Therapy

## 2021-10-13 ENCOUNTER — Ambulatory Visit (HOSPITAL_BASED_OUTPATIENT_CLINIC_OR_DEPARTMENT_OTHER): Payer: Medicare HMO | Admitting: Physical Therapy

## 2021-10-13 DIAGNOSIS — M25551 Pain in right hip: Secondary | ICD-10-CM | POA: Diagnosis present

## 2021-10-13 DIAGNOSIS — R2689 Other abnormalities of gait and mobility: Secondary | ICD-10-CM

## 2021-10-13 NOTE — Therapy (Signed)
Therapist)               PT End of Session - 10/13/21 1625     Visit Number 10    Number of Visits 19    Date for PT Re-Evaluation 10/14/21    Authorization Type [employer] Medicare ( no [place])    PT Start Time 1420    PT Stop Time 1450   Patient began expieriencing more pain   PT Time Calculation (min) 30 min    Activity Tolerance Patient tolerated treatment well    Behavior During Therapy [facility] for tasks assessed/performed                  OUTPATIENT PHYSICAL THERAPY LOWER EXTREMITY Treatment      Patient Name: [name] [name] MRN: [id number] DOB:1940-08-25, 81 y.o., female Today's Date: 07/19/2021           Past Medical History:  Diagnosis Date   Acute non-recurrent pansinusitis 04/20/2021   Acute respiratory failure with hypoxia ([place]) 03/10/2021   Allergy     Anxiety     Arthritis     At risk for allergic reaction to medication 02/04/2021   Blood transfusion without reported diagnosis     Cancer ([place])      Melanoma on Great toe   Candida infection 04/20/2021   Carotid artery plaque, 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   Cataract     Closed fracture of upper end of humerus 11/03/2017   COPD (chronic obstructive pulmonary disease) ([place])     COVID-19 vaccine series started 07/01/2020   COVID-19 virus infection 03/09/2021   Depression     Encounter for Medicare annual wellness exam 07/01/2020   Encounter to establish care 06/03/2020   Essential hypertension 05/19/2019   Essential hypertension 05/19/2019   Female stress incontinence 02/14/2018   GERD (gastroesophageal reflux disease)     History of opioid abuse ([place]) 05/19/2019   Hospital discharge follow-up 03/24/2021   Hyperglycemia 05/19/2019   Hyperlipidemia     Hypertension     Irritation of right eye 12/09/2020   Menopause present 02/11/2018   Need for shingles vaccine 07/01/2020   Osteoporosis     Prediabetes 05/22/2019   Sleep apnea     Subacute cough 03/24/2021   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         Past Surgical  History:  Procedure Laterality Date   [place] Right      Great Toe   REPLACEMENT TOTAL KNEE Bilateral 2000   sacral neuromodulation            Patient Active Problem List    Diagnosis Date Noted   Gait instability 07/05/2021   PAD (peripheral artery disease) ([place]) 05/10/2021   Lymphadenopathy, inguinal 05/05/2021   Type 2 diabetes mellitus with hyperglycemia, without long-term current use of insulin ([place]) 03/24/2021   Hypertension associated with diabetes ([place]) 02/04/2021   Lactose intolerance 11/11/2020   Pulmonary nodules 10/21/2020   Neuropathy 07/01/2020   Encounter for medication management 06/03/2020   Coronary artery disease involving native heart without angina pectoris 12/18/2019   History of malignant melanoma 12/18/2019   Basal cell carcinoma of skin 12/18/2019   History of surgery for cerebral aneurysm 12/18/2019   Hyperlipidemia associated with type 2 diabetes mellitus ([place]) 05/22/2019   Adjustment disorder with mixed anxiety and depressed mood 05/19/2019   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 05/19/2019  Bilateral carotid artery occlusion 05/19/2019   Anemia 05/19/2019   Bipolar disorder ([place]) 05/19/2019   Chronic obstructive lung disease ([place]) 05/19/2019   Chronic pain syndrome 05/19/2019   Gastroesophageal reflux disease without esophagitis 05/19/2019   Generalized anxiety disorder 05/19/2019   Hypertensive heart disease without congestive heart failure 05/19/2019   Obstructive sleep apnea syndrome 05/19/2019   Polyp of colon 05/19/2019   Depression, major, single episode, moderate ([place]) 05/19/2019   Spinal stenosis in cervical region 05/19/2019   History of TIA (transient ischemic attack) 05/19/2019   Mixed urinary incontinence due to female genital prolapse 05/19/2019   Tricuspid valve disorders, 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   Panic disorder  with agoraphobia 02/22/2018   Diverticulosis of large intestine 02/14/2018   Localized, primary osteoarthritis of shoulder region 02/14/2018   Mitral valve regurgitation 02/14/2018   Grade I internal hemorrhoids 02/14/2018   Bilateral carpal tunnel syndrome 02/11/2018   Morbid obesity ([place]) 02/11/2018   Nondependent opioid abuse in remission ([place]) 02/11/2018      PCP: Orma Render, NP   REFERRING PROVIDER: Orma Render, NP   REFERRING DIAG:  R26.81 (ICD-10-CM) - Gait instability  Z74.1 (ICD-10-CM) - Dependent for walking      THERAPY DIAG:  Other abnormalities of gait and mobility   Pain in right hip   Rationale for Evaluation and Treatment Rehabilitation   ONSET DATE: Jan 2023   SUBJECTIVE:    SUBJECTIVE STATEMENT: The patient had no major pain after her treatment last time.   PERTINENT HISTORY: Bil femur fractures, T2DM, CAD, cancer (melanoma) and subsequent R great toe amputation, anxiety   PAIN:  Are you having pain? Yes: NPRS scale: 2/10 Pain location: right foot  Pain description: aching  Aggravating factors: Standing  Relieving factors: rest    PRECAUTIONS: None   WEIGHT BEARING RESTRICTIONS No   FALLS:  Has patient fallen in last 6 months? No   LIVING ENVIRONMENT: Lives with: lives in an assisted living facility Lives in: House/apartment Stairs: No Has following equipment at home: Gilford Rile - 4 wheeled   OCCUPATION: Retired PTA   PLOF: Independent   PATIENT GOALS: Wants to improve walking/fitness and be able to visit her sister in [place] in October.     OBJECTIVE:    DIAGNOSTIC FINDINGS:  None   PATIENT SURVEYS:  FOTO 44                        Expected 30 in 11 visits   COGNITION:           Overall cognitive status: Within functional limits for tasks assessed                          SENSATION: Impaired - pt reports decreased sensation in bil feet from T2DM neuropathy     POSTURE: rounded shoulders, forward head, and flexed trunk     PALPATION: Tender to palpation of glute med/min, piriformis, IT band   LOWER EXTREMITY ROM:   Active ROM Right eval Left eval  Hip flexion Limited, concordant pain Limited  Hip extension      Hip abduction      Hip adduction      Hip internal rotation Painful    Hip external rotation Painful    Knee flexion      Knee extension Painful    Ankle dorsiflexion Limited Limited  Ankle plantarflexion      Ankle inversion  Ankle eversion       (Blank rows = not tested)   LOWER EXTREMITY MMT:   MMT Right eval Left eval right left  Hip flexion 26.2 36.1 25.7 33.8  Hip extension        Hip abduction 21 27.1 not able to maintain hold  25.7 27  Hip adduction        Hip internal rotation        Hip external rotation        Knee flexion        Knee extension 23 16.5 30.1 29.5  Ankle dorsiflexion        Ankle plantarflexion        Ankle inversion        Ankle eversion         (Blank rows = not tested)       FUNCTIONAL TESTS:  Timed up and go (TUG): 21sec -Pt required use of UE to get out of chair     TODAY'S TREATMENT: 9/7 Manual Therapy: trigger point rlease to gluteal in supine; LAD to right leg in supine   Quad set 2x15 bilateral  SAQ 2x01 bilateral. At this time the patient began reporting an increase in pain. She sat up and the pain remained. She reports everything is hurting today.   8/31 Seated Shoulder flexion 2x10 1lbs  Seated Shoulder Y's 2x10 1lbs  Biceps curl 2x10 1lb   Row 2x10 yellow  Shoulder extension yellow 2x10   Step up 2x10 4 inch  Lateral step up 2x10 2 inch   8/29 Seated bilateral hip abduction red 3x15  Seated LAQ 3x10 each leg  Seated march 2x15   Step up 2x10 4 inch  Lateral step up 2x10 4 inch  Step down 3x10 2 inch   Standing weight shift forward x20      7/27  Reviewed goals / test and measures   LTR 2x10 with cuing for technique  Bridge 3x10 with cuing for amplitude of movement   Step up 2x10 2 inch  Lateral  step up 2x10 2 inch   There-act: worked on technique with supine to sit and sit to supine transfer.          PATIENT EDUCATION:  Education details: HEP, exercise, safety with transfers Person educated: Patient Education method: Explanation Education comprehension: verbalized understanding     HOME EXERCISE PROGRAM: -Gentle stretching           -Knee-to-chest           -Piriformis stretch -Self-release of trigger points   ASSESSMENT:   CLINICAL IMPRESSION: The patient had a limited visit today. She was painful upon arrival. She felt better after manual therapy but her pain returned with light there-ex. She decline further there-ex. She felt she needs to just go home and rest. We will see her next week and hopefully progress back to stair training.   OBJECTIVE IMPAIRMENTS decreased balance, decreased endurance, difficulty walking, decreased ROM, and decreased strength.    ACTIVITY LIMITATIONS bending, standing, stairs, transfers, and bed mobility   PARTICIPATION LIMITATIONS: shopping and community activity   PERSONAL FACTORS Age, Fitness, Time since onset of injury/illness/exacerbation, and 3+ comorbidities: (see PMH)  are also affecting patient's functional outcome.    REHAB POTENTIAL: Good   CLINICAL DECISION MAKING: Evolving/moderate complexity   EVALUATION COMPLEXITY: Moderate     GOALS: Goals reviewed with patient? Yes   SHORT TERM GOALS: Target date: 08/09/2021 assessed on 7/28 Pt will improve hip flexion  and ankle dorsiflexion ROM to [facility] to improve bed mobility and gait mechanics. Baseline: Goal status: no pain with hip flexion >100 degrees achieved    2.  Pt will be able to transfer from supine to seated and from sit-standing without pain or assistance.  Baseline:  Goal status: Improving but still has pain. Needs to work on technique    3.  Pt will improve bil hip abduction strength by 20%. Baseline:  Goal status: Improving but goal not mer      LONG  TERM GOALS: Target date: 08/30/2021    Pt will improve TUG score by MCID of 3.4 seconds to demonstrate a reduced risk for falls. Baseline:  Goal status: not tested    2.  Pt will be able to stand or ambulate 20 minutes with normalized gait mechanics without pain. Baseline:  Goal status: improved but foot limiting gait mechanics    3.  Pt will be fully independent with exercise program to maintain current level of fitness. Baseline:  Goal status: working on HEP      PLAN: PT FREQUENCY: 1-2x/week   PT DURATION: 6 weeks   PLANNED INTERVENTIONS: Therapeutic exercises, Therapeutic activity, Neuromuscular re-education, Balance training, Gait training, Patient/Family education, Joint mobilization, Stair training, Aquatic Therapy, Dry Needling, Cryotherapy, and Moist heat   PLAN FOR NEXT SESSION: Continue STM as indicated. Lateral hip, core, ankle strengthening, gait training. Consider aerobic endurance assessment. Assess tolerance to met pad                     Carolyne Littles PT DPT  08/23/2021

## 2021-10-17 ENCOUNTER — Telehealth: Payer: Self-pay

## 2021-10-17 NOTE — Telephone Encounter (Signed)
ILR @ [facility] 06/10/21. Patient called made aware. Discussed options to leave device in or explanted. Patient would like to leave device in. Advised if she has further questions or concerns to please call. Verbalized understanding.  Return kit sent to address.  Marked "I" in [employer]. Canceled all future remotes.

## 2021-10-18 ENCOUNTER — Encounter (HOSPITAL_BASED_OUTPATIENT_CLINIC_OR_DEPARTMENT_OTHER): Payer: Self-pay | Admitting: Physical Therapy

## 2021-10-18 ENCOUNTER — Ambulatory Visit (INDEPENDENT_AMBULATORY_CARE_PROVIDER_SITE_OTHER): Payer: Medicare HMO | Admitting: Internal Medicine

## 2021-10-18 ENCOUNTER — Encounter: Payer: Self-pay | Admitting: Internal Medicine

## 2021-10-18 VITALS — BP 120/76 | HR 86 | Ht 65.0 in | Wt 265.0 lb

## 2021-10-18 DIAGNOSIS — R197 Diarrhea, unspecified: Secondary | ICD-10-CM | POA: Diagnosis not present

## 2021-10-18 NOTE — Progress Notes (Signed)
Chief Complaint: Diarrhea  HPI : 81 year old female with history of OSA, melanoma, anxiety, COPD, recent DVT on Eliquis, and osteoporosis presents with diarrhea  She started developing diarrhea about a year ago that has been getting worse over time. She has noticed that the diarrhea seems to get worse with eating certain foods. She has become lactose intolerant over time. With regards to her diarrhea, she is not able to pinpoint exactly what foods are causing her diarrhea. She has on average 1 BM per day. She has fecal urgency within an hour of eating. The stools look black because she takes iron. Denies red blood in stools or sticky tarry stools. She only has ab pain right before she has a BM. This ab pain gets better after having a BM. Denies N&V, dysphagia, chest burning, or regurgitation. Her last colonoscopy was 2-3 years ago that was normal. Denies family history of GI issues. She takes Imodium every other day when she experiences diarrhea. She lives in an ALF. Denies weight loss. Denies alcohol use or ingestion of artificial sugars. She does drink decaf coffee on occasion.   Wt Readings from Last 3 Encounters:  10/18/21 265 lb (120.2 kg)  07/05/21 256 lb (116.1 kg)  05/05/21 257 lb (116.6 kg)   Past Medical History:  Diagnosis Date   Acute non-recurrent pansinusitis 04/20/2021   Acute respiratory failure with hypoxia ([facility]) 03/10/2021   Allergy    Anxiety    Arthritis    At risk for allergic reaction to medication 02/04/2021   Blood transfusion without reported diagnosis    Cancer ([place])    Melanoma on Great toe   Candida infection 04/20/2021   Carotid artery plaque, 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   Cataract    Closed fracture of upper end of humerus 11/03/2017   COPD (chronic obstructive pulmonary disease) ([place])    COVID-19 vaccine series started 07/01/2020   COVID-19 virus infection 03/09/2021   Depression    Encounter for Medicare annual wellness exam 07/01/2020   Encounter to establish care  06/03/2020   Essential hypertension 05/19/2019   Essential hypertension 05/19/2019   Female stress incontinence 02/14/2018   GERD (gastroesophageal reflux disease)    History of opioid abuse ([place]) 05/19/2019   Hospital discharge follow-up 03/24/2021   Hyperglycemia 05/19/2019   Hyperlipidemia    Hypertension    Irritation of right eye 12/09/2020   Menopause present 02/11/2018   Need for shingles vaccine 07/01/2020   Osteoporosis    Prediabetes 05/22/2019   Sleep apnea    Subacute cough 03/24/2021   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     Past Surgical History:  Procedure Laterality Date   ABDOMINAL HYSTERECTOMY  1980   CHOLECYSTECTOMY  1980   MELANOMA EXCISION Right    Great Toe   REPLACEMENT TOTAL KNEE Bilateral 2000   sacral neuromodulation     Family History  Problem Relation Age of Onset   Cancer Mother    Intellectual disability Mother    Cancer Father    Early death Father    Hypertension Father    Cancer Sister    Arthritis Maternal Grandmother    Asthma Paternal Grandmother    Asthma Paternal Grandfather    Colon cancer Neg Hx    Stomach cancer Neg Hx    Esophageal cancer Neg Hx    Colon polyps Neg Hx    Social History   Tobacco Use   Smoking status: Former    Years: 20.00    Types: Cigarettes  Start date: 61    Quit date: 1988    Years since quitting: 35.7   Smokeless tobacco: Never  Vaping Use   Vaping Use: Never used  Substance Use Topics   Alcohol use: Not Currently   Drug use: Never   Current Outpatient Medications  Medication Sig Dispense Refill   albuterol (VENTOLIN HFA) 108 (90 Base) MCG/ACT inhaler Inhale 1-2 puffs into the lungs every 4 (four) hours as needed for wheezing or shortness of breath. 18 g 1   ALPRAZolam (XANAX) 0.25 MG tablet TAKE (1) TABLET BY MOUTH TWICE DAILY AT 11AM & 5PM FOR ANXIETY. (Patient taking differently: Take 0.25 mg by mouth 2 (two) times daily. 1100 and 1500 And an extra 0.28m oral twice daily as needed for  anxiety) 60 tablet 0   AMBULATORY NON FORMULARY MEDICATION One four wheel rolling walker with seat and hand brakes. Brand/style covered by insurance. Medically necessary for ICD-10: R26.81, Z74.1 1 Units 0   amLODIPine-Valsartan-HCTZ 5-160-12.5 MG TABS Take 2 tablets by mouth daily. 180 tablet 1   ARTHRITIS PAIN RELIEVING 0.075 % topical cream Apply 1 application topically 3 (three) times daily as needed (neuropathic pain in feet).     atorvastatin (LIPITOR) 10 MG tablet TAKE (1) TABLET BY MOUTH ONCE DAILY. 30 tablet 10   Bacillus Coagulans-Inulin (PROBIOTIC FORMULA) 1-250 BILLION-MG CAPS TAKE 1 CAPSULE (250MG) BY MOUTH 2 TIMES A DAY. 60 capsule 11   blood glucose meter kit and supplies KIT Meter, test strips, lancets, and alcohol swabs. Use for testing blood sugar every morning before meals and up to 3 additional times per day as needed. Brand based on patient and insurance coverage. 100 strips with 99 refills, 100 lancets with 99 refills, 200 alcohol swabs with 99 refills. Dx: E11.65 1 each 99   camphor-menthol (SARNA) lotion Apply 1 application topically as needed for itching. (Patient taking differently: Apply 1 application  topically 2 (two) times daily as needed for itching.) 222 mL 0   carvedilol (COREG) 6.25 MG tablet TAKE (1) TABLET BY MOUTH TWICE DAILY WITH A MEAL. 180 tablet 3   cetirizine (ZYRTEC) 10 MG tablet TAKE 1 TABLET BY MOUTH ONCE A DAY. 30 tablet 10   cloNIDine (CATAPRES) 0.1 MG tablet Take 1 tablet (0.1 mg total) by mouth 3 (three) times daily as needed (systolic blood pressurre above 190). 15 tablet 0   cloNIDine (CATAPRES) 0.2 MG tablet TAKE (1) TABLET BY MOUTH ONCE EVERY HOUR AS NEEDED FOR SBP>190. **MAX 3 TABLETS (0.6MG) IN 24 HOURS** 30 tablet 3   dapagliflozin propanediol (FARXIGA) 10 MG TABS tablet Take 1 tablet (10 mg total) by mouth daily before breakfast. 90 tablet 3   diphenhydrAMINE (BENADRYL) 25 mg capsule Take 1 capsule (25 mg total) by mouth every 8 (eight) hours as  needed for itching or allergies. 30 capsule 1   EASYMAX TEST test strip      ELIQUIS 5 MG TABS tablet TAKE (1) TABLET BY MOUTH TWICE DAILY. 60 tablet 10   Estradiol 10 MCG TABS vaginal tablet Place 1 tab nightly for twice a week 8 tablet 11   famotidine (PEPCID) 20 MG tablet Take 1 tablet (20 mg total) by mouth daily as needed for heartburn or indigestion. 90 tablet 3   FEROSUL 325 (65 Fe) MG tablet TAKE (1) TABLET BY MOUTH ONCE DAILY. 30 tablet 10   fluconazole (DIFLUCAN) 150 MG tablet Take 1 tablet (150 mg total) by mouth once as needed for up to 1 dose. 1 tablet  1   guaiFENesin (MUCINEX) 600 MG 12 hr tablet Take 2 tablets (1,200 mg total) by mouth 2 (two) times daily as needed for to loosen phlegm or cough. Use for 7 days then as needed if cough is improving. 30 tablet 1   hydrALAZINE (APRESOLINE) 25 MG tablet TAKE 1 TABLET BY MOUTH TWICE A DAY. 60 tablet 10   Lactase 9000 units TABS Take one tablet with the first bite of meals containing milk or dairy products. (Patient taking differently: Take 9,000 Units by mouth See admin instructions. Take 9000units with the first bite of meals containing milk or dairy products.) 90 tablet 11   mirabegron ER (MYRBETRIQ) 25 MG TB24 tablet Take 1 tablet (25 mg total) by mouth daily. 30 tablet 5   mirtazapine (REMERON) 7.5 MG tablet Take 1 tablet (7.5 mg total) by mouth at bedtime. For depression and anxiety symptoms 30 tablet 2   pantoprazole (PROTONIX) 40 MG tablet TAKE (1) TABLET BY MOUTH EVERY MORNING FOR GERD. 30 tablet 10   PARoxetine (PAXIL) 40 MG tablet Take 40 mg by mouth daily.     TRELEGY ELLIPTA 100-62.5-25 MCG/ACT AEPB INHALE 1 PUFF INTO THE LUNGS DAILY. ** RINSE MOUTH AFTER USE** (Patient taking differently: Inhale 1 puff into the lungs daily.) 60 each 0   cephALEXin (KEFLEX) 250 MG capsule Take 1 capsule (250 mg total) by mouth daily. (Patient not taking: Reported on 10/18/2021) 30 capsule 5   No current facility-administered medications for this  visit.   Allergies  Allergen Reactions   Azithromycin Anaphylaxis and Hives   Bee Venom Anaphylaxis   Erythromycin Base Anaphylaxis   Metronidazole Anaphylaxis   Nitrofurantoin Macrocrystal Anaphylaxis   Penicillins Anaphylaxis and Hives    childhood   Sulfa Antibiotics Anaphylaxis and Hives    Childhood   Cymbalta [Duloxetine Hcl]    Ivp Dye [Iodinated Contrast Media]    Penicillin G Hives   Codeine Rash   Erythromycin Rash and Hives   Fosfomycin Rash   Levofloxacin Rash   Nitrofurantoin Hives   Percocet [Oxycodone-Acetaminophen] Rash     Review of Systems: All systems reviewed and negative except where noted in HPI.   Physical Exam: BP 120/76   Pulse 86   Ht '5\' 5"'  (1.651 m)   Wt 265 lb (120.2 kg)   BMI 44.10 kg/m  Constitutional: Pleasant,well-developed, female in no acute distress. HEENT: Normocephalic and atraumatic. Conjunctivae are normal. No scleral icterus. Cardiovascular: Normal rate, regular rhythm.  Pulmonary/chest: Effort normal and breath sounds normal. No wheezing, rales or rhonchi. Abdominal: Soft, nondistended, mildly tender in the RLQ. Bowel sounds active throughout. There are no masses palpable. No hepatomegaly. Extremities: No edema Neurological: Alert and oriented to person place and time. Skin: Skin is warm and dry. No rashes noted. Psychiatric: Normal mood and affect. Behavior is normal.  Labs 06/2021: CBC and CMP unremarkable.   ASSESSMENT AND PLAN: Diarrhea Patient presents with diarrhea over the last year. The diarrhea seems to be food-related. Instructed her to follow a low FODMAP diet and trial daily Metamucil. She can continue to use Imodium PRN to help with diarrhea. - Low FODMAP diet - Can trial daily Metamucil  - Cont Imodium 2 mg TID PRN - RTC 2 months  Christia Reading, MD

## 2021-10-18 NOTE — Patient Instructions (Signed)
_______________________________________________________  If you are age 81 or older, your body mass index should be between 23-30. Your Body mass index is 44.1 kg/m. If this is out of the aforementioned range listed, please consider follow up with your Primary Care Provider. ________________________________________________________  The [facility] providers would like to encourage you to use [facility] to communicate with providers for non-urgent requests or questions.  Due to long hold times on the telephone, sending your provider a message by [facility] may be a faster and more efficient way to get a response.  Please allow 48 business hours for a response.  Please remember that this is for non-urgent requests.  _______________________________________________________  Please purchase the following medications over the counter and take as directed: START: Metamucil daily  CONTINUE: Imodium '2mg'$  three times daily as needed.  You will need a follow up appointment in 3 months (December 2023).  We will contact you to schedule this appointment.  Low-FODMAP Eating Plan    FODMAP stands for fermentable oligosaccharides, disaccharides, monosaccharides, and polyols. These are sugars that are hard for some people to digest. A low-FODMAP eating plan may help some people who have irritable bowel syndrome (IBS) and certain other bowel (intestinal) diseases to manage their symptoms. This meal plan can be complicated to follow. Work with a diet and nutrition specialist (dietitian) to make a low-FODMAP eating plan that is right for you. A dietitian can help make sure that you get enough nutrition from this diet. What are tips for following this plan? Reading food labels Check labels for hidden FODMAPs such as: High-fructose syrup. Honey. Agave. Natural fruit flavors. Onion or garlic powder. Choose low-FODMAP foods that contain 3-4 grams of fiber per serving. Check food labels for serving sizes. Eat only one  serving at a time to make sure FODMAP levels stay low. Shopping Shop with a list of foods that are recommended on this diet and make a meal plan. Meal planning Follow a low-FODMAP eating plan for up to 6 weeks, or as told by your health care provider or dietitian. To follow the eating plan: Eliminate high-FODMAP foods from your diet completely. Choose only low-FODMAP foods to eat. You will do this for 2-6 weeks. Gradually reintroduce high-FODMAP foods into your diet one at a time. Most people should wait a few days before introducing the next new high-FODMAP food into their meal plan. Your dietitian can recommend how quickly you may reintroduce foods. Keep a daily record of what and how much you eat and drink. Make note of any symptoms that you have after eating. Review your daily record with a dietitian regularly to identify which foods you can eat and which foods you should avoid. General tips Drink enough fluid each day to keep your urine pale yellow. Avoid processed foods. These often have added sugar and may be high in FODMAPs. Avoid most dairy products, whole grains, and sweeteners. Work with a dietitian to make sure you get enough fiber in your diet. Avoid high FODMAP foods at meals to manage symptoms. Recommended foods Fruits Bananas, oranges, tangerines, lemons, limes, blueberries, raspberries, strawberries, grapes, cantaloupe, honeydew melon, kiwi, papaya, passion fruit, and pineapple. Limited amounts of dried cranberries, banana chips, and shredded coconut. Vegetables Eggplant, zucchini, cucumber, peppers, green beans, bean sprouts, lettuce, arugula, kale, Swiss chard, spinach, collard greens, bok choy, summer squash, potato, and tomato. Limited amounts of corn, carrot, and sweet potato. Green parts of scallions. Grains Gluten-free grains, such as rice, oats, buckwheat, quinoa, corn, polenta, and millet. Gluten-free pasta,  bread, or cereal. Rice noodles. Corn tortillas. Meats and  other proteins Unseasoned beef, pork, poultry, or fish. Eggs. Berniece Salines. Tofu (firm) and tempeh. Limited amounts of nuts and seeds, such as almonds, walnuts, [place] nuts, pecans, peanuts, nut butters, pumpkin seeds, chia seeds, and sunflower seeds. Dairy Lactose-free milk, yogurt, and kefir. Lactose-free cottage cheese and ice cream. Non-dairy milks, such as almond, coconut, hemp, and rice milk. Non-dairy yogurt. Limited amounts of goat cheese, brie, mozzarella, parmesan, swiss, and other hard cheeses. Fats and oils Butter-free spreads. Vegetable oils, such as olive, canola, and sunflower oil. Seasoning and other foods Artificial sweeteners with names that do not end in "ol," such as aspartame, saccharine, and stevia. Maple syrup, white table sugar, raw sugar, brown sugar, and molasses. Mayonnaise, soy sauce, and tamari. Fresh basil, coriander, parsley, rosemary, and thyme. Beverages Water and mineral water. Sugar-sweetened soft drinks. Small amounts of orange juice or cranberry juice. Black and green tea. Most dry wines. Coffee. The items listed above may not be a complete list of foods and beverages you can eat. Contact a dietitian for more information. Foods to avoid Fruits Fresh, dried, and juiced forms of apple, pear, watermelon, peach, plum, cherries, apricots, blackberries, boysenberries, figs, nectarines, and mango. Avocado. Vegetables Chicory root, artichoke, asparagus, cabbage, snow peas, Brussels sprouts, broccoli, sugar snap peas, mushrooms, celery, and cauliflower. Onions, garlic, leeks, and the white part of scallions. Grains Wheat, including kamut, durum, and semolina. Barley and bulgur. Couscous. Wheat-based cereals. Wheat noodles, bread, crackers, and pastries. Meats and other proteins Fried or fatty meat. Sausage. Cashews and pistachios. Soybeans, baked beans, black beans, chickpeas, kidney beans, fava beans, navy beans, lentils, black-eyed peas, and split peas. Dairy Milk, yogurt,  ice cream, and soft cheese. Cream and sour cream. Milk-based sauces. Custard. Buttermilk. Soy milk. Seasoning and other foods Any sugar-free gum or candy. Foods that contain artificial sweeteners such as sorbitol, mannitol, isomalt, or xylitol. Foods that contain honey, high-fructose corn syrup, or agave. Bouillon, vegetable stock, beef stock, and chicken stock. Garlic and onion powder. Condiments made with onion, such as hummus, chutney, pickles, relish, salad dressing, and salsa. Tomato paste. Beverages Chicory-based drinks. Coffee substitutes. Chamomile tea. Fennel tea. Sweet or fortified wines such as port or sherry. Diet soft drinks made with isomalt, mannitol, maltitol, sorbitol, or xylitol. Apple, pear, and mango juice. Juices with high-fructose corn syrup. The items listed above may not be a complete list of foods and beverages you should avoid. Contact a dietitian for more information. Summary FODMAP stands for fermentable oligosaccharides, disaccharides, monosaccharides, and polyols. These are sugars that are hard for some people to digest. A low-FODMAP eating plan is a short-term diet that helps to ease symptoms of certain bowel diseases. The eating plan usually lasts up to 6 weeks. After that, high-FODMAP foods are reintroduced gradually and one at a time. This can help you find out which foods may be causing symptoms. A low-FODMAP eating plan can be complicated. It is best to work with a dietitian who has experience with this type of plan. This information is not intended to replace advice given to you by your health care provider. Make sure you discuss any questions you have with your health care provider. Document Revised: 06/12/2019 Document Reviewed: 06/12/2019 Elsevier Patient Education  [place] you for entrusting me with your care and choosing [facility].  Dr Lorenso Courier

## 2021-10-20 ENCOUNTER — Other Ambulatory Visit (HOSPITAL_BASED_OUTPATIENT_CLINIC_OR_DEPARTMENT_OTHER): Payer: Self-pay

## 2021-10-20 ENCOUNTER — Ambulatory Visit (HOSPITAL_BASED_OUTPATIENT_CLINIC_OR_DEPARTMENT_OTHER): Payer: Medicare HMO | Admitting: Physical Therapy

## 2021-10-20 ENCOUNTER — Other Ambulatory Visit (INDEPENDENT_AMBULATORY_CARE_PROVIDER_SITE_OTHER): Payer: Medicare HMO

## 2021-10-20 ENCOUNTER — Ambulatory Visit (HOSPITAL_BASED_OUTPATIENT_CLINIC_OR_DEPARTMENT_OTHER): Payer: Medicare HMO

## 2021-10-20 ENCOUNTER — Encounter (HOSPITAL_BASED_OUTPATIENT_CLINIC_OR_DEPARTMENT_OTHER): Payer: Self-pay | Admitting: Physical Therapy

## 2021-10-20 DIAGNOSIS — M25551 Pain in right hip: Secondary | ICD-10-CM

## 2021-10-20 DIAGNOSIS — R2689 Other abnormalities of gait and mobility: Secondary | ICD-10-CM

## 2021-10-20 DIAGNOSIS — N3090 Cystitis, unspecified without hematuria: Secondary | ICD-10-CM | POA: Diagnosis not present

## 2021-10-20 LAB — POCT URINALYSIS DIP (CLINITEK)
Bilirubin, UA: NEGATIVE
Glucose, UA: >=1000 mg/dL — AB
Ketones, POC UA: NEGATIVE mg/dL
Nitrite, UA: POSITIVE — AB
POC PROTEIN,UA: 30 — AB
Spec Grav, UA: 1.02 (ref 1.010–1.025)
Urobilinogen, UA: 0.2 E.U./dL
pH, UA: 5.5 (ref 5.0–8.0)

## 2021-10-20 MED ORDER — CIPROFLOXACIN HCL 250 MG PO TABS: 250.0000 mg | ORAL_TABLET | Freq: Two times a day (BID) | ORAL | 0 refills | Status: AC

## 2021-10-20 NOTE — Therapy (Signed)
Therapist)                      OUTPATIENT PHYSICAL THERAPY LOWER EXTREMITY Treatment /Progress Note      Patient Name: [name] [name] MRN: [id number] DOB:Dec 14, 1940, 81 y.o., female Today's Date: 07/19/2021    Progress Note Reporting Period 09/01/2021 to 10/20/2021  See note below for Objective Data and Assessment of Progress/Goals.           Past Medical History:  Diagnosis Date   Acute non-recurrent pansinusitis 04/20/2021   Acute respiratory failure with hypoxia (HCC) 03/10/2021   Allergy     Anxiety     Arthritis     At risk for allergic reaction to medication 02/04/2021   Blood transfusion without reported diagnosis     Cancer ([place])      Melanoma on Great toe   Candida infection 04/20/2021   Carotid artery plaque, 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   Cataract     Closed fracture of upper end of humerus 11/03/2017   COPD (chronic obstructive pulmonary disease) ([place])     COVID-19 vaccine series started 07/01/2020   COVID-19 virus infection 03/09/2021   Depression     Encounter for Medicare annual wellness exam 07/01/2020   Encounter to establish care 06/03/2020   Essential hypertension 05/19/2019   Essential hypertension 05/19/2019   Female stress incontinence 02/14/2018   GERD (gastroesophageal reflux disease)     History of opioid abuse ([place]) 05/19/2019   Hospital discharge follow-up 03/24/2021   Hyperglycemia 05/19/2019   Hyperlipidemia     Hypertension     Irritation of right eye 12/09/2020   Menopause present 02/11/2018   Need for shingles vaccine 07/01/2020   Osteoporosis     Prediabetes 05/22/2019   Sleep apnea     Subacute cough 03/24/2021   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         Past Surgical History:  Procedure Laterality Date   [place] Right      Great Toe   REPLACEMENT TOTAL KNEE Bilateral 2000   sacral neuromodulation            Patient Active Problem List    Diagnosis Date Noted    Gait instability 07/05/2021   PAD (peripheral artery disease) ([place]) 05/10/2021   Lymphadenopathy, inguinal 05/05/2021   Type 2 diabetes mellitus with hyperglycemia, without long-term current use of insulin ([place]) 03/24/2021   Hypertension associated with diabetes ([place]) 02/04/2021   Lactose intolerance 11/11/2020   Pulmonary nodules 10/21/2020   Neuropathy 07/01/2020   Encounter for medication management 06/03/2020   Coronary artery disease involving native heart without angina pectoris 12/18/2019   History of malignant melanoma 12/18/2019   Basal cell carcinoma of skin 12/18/2019   History of surgery for cerebral aneurysm 12/18/2019   Hyperlipidemia associated with type 2 diabetes mellitus ([place]) 05/22/2019   Adjustment disorder with mixed anxiety and depressed mood 05/19/2019   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 05/19/2019   Bilateral carotid artery occlusion 05/19/2019   Anemia 05/19/2019   Bipolar disorder ([name]) 05/19/2019   Chronic obstructive lung disease ([place]) 05/19/2019   Chronic pain syndrome 05/19/2019   Gastroesophageal reflux disease without esophagitis 05/19/2019   Generalized anxiety disorder 05/19/2019   Hypertensive heart disease without congestive heart failure 05/19/2019   Obstructive sleep apnea syndrome 05/19/2019   Polyp of colon 05/19/2019   Depression, major, single episode, moderate (  [place]) 05/19/2019   Spinal stenosis in cervical region 05/19/2019   History of TIA (transient ischemic attack) 05/19/2019   Mixed urinary incontinence due to female genital prolapse 05/19/2019   Tricuspid valve disorders, 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   Panic disorder with agoraphobia 02/22/2018   Diverticulosis of large intestine 02/14/2018   Localized, primary osteoarthritis of shoulder region 02/14/2018   Mitral valve regurgitation 02/14/2018   Grade I internal hemorrhoids 02/14/2018   Bilateral carpal tunnel syndrome 02/11/2018    Morbid obesity ([place]) 02/11/2018   Nondependent opioid abuse in remission ([place]) 02/11/2018      PCP: Orma Render, NP   REFERRING PROVIDER: Orma Render, NP   REFERRING DIAG:  R26.81 (ICD-10-CM) - Gait instability  Z74.1 (ICD-10-CM) - Dependent for walking      THERAPY DIAG:  Other abnormalities of gait and mobility   Pain in right hip   Rationale for Evaluation and Treatment Rehabilitation   ONSET DATE: Jan 2023   SUBJECTIVE:    SUBJECTIVE STATEMENT: The patient had no major pain after her treatment last time.   PERTINENT HISTORY: Bil femur fractures, T2DM, CAD, cancer (melanoma) and subsequent R great toe amputation, anxiety   PAIN:  Are you having pain? Yes: NPRS scale: 2/10 Pain location: right foot  Pain description: aching  Aggravating factors: Standing  Relieving factors: rest    PRECAUTIONS: None   WEIGHT BEARING RESTRICTIONS No   FALLS:  Has patient fallen in last 6 months? No   LIVING ENVIRONMENT: Lives with: lives in an assisted living facility Lives in: House/apartment Stairs: No Has following equipment at home: [place] Rile - 4 wheeled   OCCUPATION: Retired PTA   PLOF: Independent   PATIENT GOALS: Wants to improve walking/fitness and be able to visit her sister in [place] in October.     OBJECTIVE:    DIAGNOSTIC FINDINGS:  None   PATIENT SURVEYS:  FOTO 44                        Expected 41 in 11 visits   COGNITION:           Overall cognitive status: Within functional limits for tasks assessed                          SENSATION: Impaired - pt reports decreased sensation in bil feet from T2DM neuropathy     POSTURE: rounded shoulders, forward head, and flexed trunk    PALPATION: Tender to palpation of glute med/min, piriformis, IT band   LOWER EXTREMITY ROM:   Active ROM Right eval Left eval Right  9/14 Left 9/14  Hip flexion Limited, concordant pain Limited Improved flexion to 105  Improved flexion to 105   Hip extension         Hip abduction        Hip adduction        Hip internal rotation Painful      Hip external rotation Painful      Knee flexion        Knee extension Painful      Ankle dorsiflexion Limited Limited    Ankle plantarflexion        Ankle inversion        Ankle eversion         (Blank rows = not tested)   LOWER EXTREMITY MMT:   MMT Right eval Left eval Right 7/27 Left 7/27 Right 9/14  Left  9/14  Hip flexion 26.2 36.1 25.7 33.8 28.3 36.2  Hip extension          Hip abduction 21 27.1 not able to maintain hold  25.7 27 28.2 29.3  Hip adduction          Hip internal rotation          Hip external rotation          Knee flexion          Knee extension 23 16.5 30.1 29.5    Ankle dorsiflexion          Ankle plantarflexion          Ankle inversion          Ankle eversion           (Blank rows = not tested)       FUNCTIONAL TESTS:  Timed up and go (TUG): 21sec -Pt required use of UE to get out of chair  Not tested this visit      TODAY'S TREATMENT: 9/14 Nu-step 5 min L3   Step up 2x10 4 inch each leg  Lateral step up 2x10 4 inch left leg only   Seated march 2x20  Seated LAQ 2x15 each leg  Seated clam shell 3x15 red       9/7 Manual Therapy: trigger point rlease to gluteal in supine; LAD to right leg in supine   Quad set 2x15 bilateral  SAQ 2x01 bilateral. At this time the patient began reporting an increase in pain. She sat up and the pain remained. She reports everything is hurting today.   8/31 Seated Shoulder flexion 2x10 1lbs  Seated Shoulder Y's 2x10 1lbs  Biceps curl 2x10 1lb   Row 2x10 yellow  Shoulder extension yellow 2x10   Step up 2x10 4 inch  Lateral step up 2x10 2 inch           PATIENT EDUCATION:  Education details: HEP, exercise, safety with transfers Person educated: Patient Education method: Explanation Education comprehension: verbalized understanding     HOME EXERCISE PROGRAM: -Gentle stretching            -Knee-to-chest           -Piriformis stretch -Self-release of trigger points   ASSESSMENT:   CLINICAL IMPRESSION: The patient did much better today then last visit. Her pain in her hip has resolved since the last visit. We got back to stair training. She had minor pain After her stair training. The rest of the visit we worked on seated there-ex. Overall she is progressing towards her goal of going up and down the steps. She has been able to stand and walk longer overall. We will continue skilled therapy 2W6.  OBJECTIVE IMPAIRMENTS decreased balance, decreased endurance, difficulty walking, decreased ROM, and decreased strength.    ACTIVITY LIMITATIONS bending, standing, stairs, transfers, and bed mobility   PARTICIPATION LIMITATIONS: shopping and community activity   PERSONAL FACTORS Age, Fitness, Time since onset of injury/illness/exacerbation, and 3+ comorbidities: (see PMH)  are also affecting patient's functional outcome.    REHAB POTENTIAL: Good   CLINICAL DECISION MAKING: Evolving/moderate complexity   EVALUATION COMPLEXITY: Moderate     GOALS: Goals reviewed with patient? Yes   SHORT TERM GOALS: Target date: 08/09/2021 assessed on 9/14 Pt will improve hip flexion and ankle dorsiflexion ROM to [facility] to improve bed mobility and gait mechanics. Baseline: Goal status: no pain with hip flexion >100 degrees achieved    2.  Pt will be able to  transfer from supine to seated and from sit-standing without pain or assistance.  Baseline:  Goal status: Improving but still has pain. Needs to work on technique    3.  Pt will improve bil hip abduction strength by 20%. Baseline:  Goal status: Improving but goal not mer      LONG TERM GOALS: Target date: 11/24/2021 9/14   Pt will improve TUG score by MCID of 3.4 seconds to demonstrate a reduced risk for falls. Baseline:  Goal status: not tested    2.  Pt will be able to stand or ambulate 20 minutes with normalized gait mechanics  without pain. Baseline:  Goal status: improved but foot limiting gait mechanics    3.  Pt will be fully independent with exercise program to maintain current level of fitness. Baseline:  Goal status: working on HEP      PLAN: PT FREQUENCY: 1-2x/week   PT DURATION: 6 weeks   PLANNED INTERVENTIONS: Therapeutic exercises, Therapeutic activity, Neuromuscular re-education, Balance training, Gait training, Patient/Family education, Joint mobilization, Stair training, Aquatic Therapy, Dry Needling, Cryotherapy, and Moist heat   PLAN FOR NEXT SESSION: Continue STM as indicated. Lateral hip, core, ankle strengthening, gait training. Consider aerobic endurance assessment. Assess tolerance to met pad                     Carolyne Littles PT DPT  08/23/2021

## 2021-10-21 ENCOUNTER — Encounter (HOSPITAL_BASED_OUTPATIENT_CLINIC_OR_DEPARTMENT_OTHER): Payer: Self-pay | Admitting: Physical Therapy

## 2021-10-24 ENCOUNTER — Ambulatory Visit (INDEPENDENT_AMBULATORY_CARE_PROVIDER_SITE_OTHER): Payer: Medicare HMO

## 2021-10-24 ENCOUNTER — Ambulatory Visit (INDEPENDENT_AMBULATORY_CARE_PROVIDER_SITE_OTHER): Payer: Medicare HMO | Admitting: Pulmonary Disease

## 2021-10-24 VITALS — BP 100/70 | HR 79 | Ht 65.0 in | Wt 266.2 lb

## 2021-10-24 DIAGNOSIS — J449 Chronic obstructive pulmonary disease, unspecified: Secondary | ICD-10-CM | POA: Diagnosis not present

## 2021-10-24 DIAGNOSIS — R55 Syncope and collapse: Secondary | ICD-10-CM

## 2021-10-24 DIAGNOSIS — R911 Solitary pulmonary nodule: Secondary | ICD-10-CM

## 2021-10-24 LAB — CUP PACEART REMOTE DEVICE CHECK
Date Time Interrogation Session: 20230914234909
Implantable Pulse Generator Implant Date: 20191002

## 2021-10-24 NOTE — Patient Instructions (Signed)
Thank you for visiting Dr. Valeta Harms at [facility]. Today we recommend the following:  Orders Placed This Encounter  Procedures   CT Super D Chest Wo Contrast   Follow up appt with [place] after your ct chest  Return in about 4 months (around 02/23/2022) for with Eric Form, NP, or Dr. Valeta Harms.    Please do your part to reduce the spread of COVID-19.

## 2021-10-24 NOTE — Progress Notes (Signed)
Synopsis: Referred in July 2022 for lung nodule, by Dr.Olalere, PCP: By Orma Render, NP  Subjective:   PATIENT ID: [name] [name] GENDER: female DOB: May 31, 1940, MRN: [id number]  Chief Complaint  Patient presents with   Follow-up    This is a 81 year old female, patient of Dr. Ander Slade, referred for right lower lobe pulmonary nodule.  She is a former smoker quit in 1988.  She had a CT scan of the chest completed in November 2020 that is visible in PACS.  This showed a 4 mm right lower lobe pulmonary nodule.  She had CT scan follow-up that was completed.  CT follow-up imaging was completed on 07/22/2020.  This CT scan of the chest revealed a 7 mm right lower lobe pulmonary nodule that was previously 4 mm and had increased in size and now has slight spiculated margins.  She had this followed by a nuclear medicine pet image on 08/19/2020.  Nuclear medicine pet imaging shows no significant hypermetabolism however the lesion itself is smaller than the lower limit for size related to PET scans.  From a respiratory standpoint she does feel short of breath with exertion.  Her mobility is aided by a rolling walker.  She is currently using as needed albuterol.  Has been told she may have COPD in the past.  She does have PFTs scheduled for today which have not been completed yet.  OV 12/20/2020: Here today for follow-up regarding COPD, shortness of breath.  She does have OSA follows with Dr. Jenetta Downer.  She enjoys using her Trelegy.  However this is costly.  She is unable to afford it.  She would like to stay on it if possible.  We will fill out the financial aid application to help her.  She also has a planned trip to [place].  She has experienced high altitude sickness before.  Associated with headache and vomiting.  She is worried about this happening again.  We talked about prophylactic measures today.  OV 10/24/2021:Patient here today for follow-up regarding CT imaging.  Patient had CT imaging on 08/23/2021  patient had a solid right lower lobe pulmonary nodule not significantly changed in comparison.  He has a subpleural solid nodule in the right upper lobe that has enlarged and become increasingly solid now measuring 5 mm in diameter.  It was more of a groundglass appearing lesion 6 months prior.  Now it has become more solid.  It does give rise for concern of potential underlying malignancy or indolent neoplasm.    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   Cataract    Closed fracture of upper end of humerus 11/03/2017   COPD (chronic obstructive pulmonary disease) ([place])    COVID-19 vaccine series started 07/01/2020   COVID-19 virus infection 03/09/2021   Depression    Encounter for Medicare annual wellness exam 07/01/2020   Encounter to establish care 06/03/2020   Essential hypertension 05/19/2019   Essential hypertension 05/19/2019   Female stress incontinence 02/14/2018   GERD (gastroesophageal reflux disease)    History of opioid abuse ([place]) 05/19/2019   Hospital discharge follow-up 03/24/2021  Hyperglycemia 05/19/2019   Hyperlipidemia    Hypertension    Irritation of right eye 12/09/2020   Menopause present 02/11/2018   Need for shingles vaccine 07/01/2020   Osteoporosis    Prediabetes 05/22/2019   Sleep apnea    Subacute cough 03/24/2021   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     Family History  Problem Relation Age of Onset   Cancer Mother    Intellectual disability Mother    Cancer Father    Early death Father    Hypertension Father    Cancer Sister    Arthritis Maternal Grandmother    Asthma Paternal Grandmother    Asthma  Paternal Grandfather    Colon cancer Neg Hx    Stomach cancer Neg Hx    Esophageal cancer Neg Hx    Colon polyps Neg Hx      Past Surgical History:  Procedure Laterality Date   ABDOMINAL HYSTERECTOMY  1980   CHOLECYSTECTOMY  1980   MELANOMA EXCISION Right    Great Toe   REPLACEMENT TOTAL KNEE Bilateral 2000   sacral neuromodulation      Social History   Socioeconomic History   Marital status: Widowed    Spouse name: Not on file   Number of children: 3   Years of education: Not on file   Highest education level: Not on file  Occupational History   Occupation: Retired  Tobacco Use   Smoking status: Former    Years: 20.00    Types: Cigarettes    Start date: 1957    Quit date: 1988    Years since quitting: 35.7   Smokeless tobacco: Never  Vaping Use   Vaping Use: Never used  Substance and Sexual Activity   Alcohol use: Not Currently   Drug use: Never   Sexual activity: Not Currently  Other Topics Concern   Not on file  Social History Narrative   Not on file   Social Determinants of Health   Financial Resource Strain: [place]  (07/01/2020)   Overall Financial Resource Strain (CARDIA)    Difficulty of Paying Living Expenses: Not hard at all  Food Insecurity: No [place] (07/01/2020)   Hunger Vital Sign    Worried About Running Out of Food in the Last Year: Never true    [place] in the Last Year: Never true  Transportation Needs: No Transportation Needs (07/01/2020)   PRAPARE - Hydrologist (Medical): No    Lack of Transportation (Non-Medical): No  Physical Activity: Sufficiently Active (02/02/2020)   Exercise Vital Sign    Days of Exercise per Week: 3 days    Minutes of Exercise per Session: 60 min  Stress: No Stress Concern Present (07/01/2020)   [place]    Feeling of Stress : Not at all  Social Connections: Moderately Isolated (07/01/2020)   Social  Connection and Isolation Panel [NHANES]    Frequency of Communication with Friends and Family: More than three times a week    Frequency of Social Gatherings with Friends and Family: More than three times a week    Attends Religious Services: Never    Marine scientist or Organizations: Yes    Attends Music therapist: More than 4 times per year    Marital Status: Widowed  Intimate Partner Violence: Not At Risk (07/01/2020)   Humiliation, Afraid, Rape, and Kick questionnaire    Fear of Current  or Ex-Partner: No    Emotionally Abused: No    Physically Abused: No    Sexually Abused: No     Allergies  Allergen Reactions   Azithromycin Anaphylaxis and Hives   Bee Venom Anaphylaxis   Erythromycin Base Anaphylaxis   Metronidazole Anaphylaxis   Nitrofurantoin Macrocrystal Anaphylaxis   Penicillins Anaphylaxis and Hives    childhood   Sulfa Antibiotics Anaphylaxis and Hives    Childhood   Cymbalta [Duloxetine Hcl]    Ivp Dye [Iodinated Contrast Media]    Penicillin G Hives   Codeine Rash   Erythromycin Rash and Hives   Fosfomycin Rash   Levofloxacin Rash   Nitrofurantoin Hives   Percocet [Oxycodone-Acetaminophen] Rash     Outpatient Medications Prior to Visit  Medication Sig Dispense Refill   albuterol (VENTOLIN HFA) 108 (90 Base) MCG/ACT inhaler Inhale 1-2 puffs into the lungs every 4 (four) hours as needed for wheezing or shortness of breath. 18 g 1   cetirizine (ZYRTEC) 10 MG tablet TAKE 1 TABLET BY MOUTH ONCE A DAY. 30 tablet 10   guaiFENesin (MUCINEX) 600 MG 12 hr tablet Take 2 tablets (1,200 mg total) by mouth 2 (two) times daily as needed for to loosen phlegm or cough. Use for 7 days then as needed if cough is improving. 30 tablet 1   TRELEGY ELLIPTA 100-62.5-25 MCG/ACT AEPB INHALE 1 PUFF INTO THE LUNGS DAILY. ** RINSE MOUTH AFTER USE** (Patient taking differently: Inhale 1 puff into the lungs daily.) 60 each 0   ALPRAZolam (XANAX) 0.25 MG tablet TAKE (1)  TABLET BY MOUTH TWICE DAILY AT 11AM & 5PM FOR ANXIETY. (Patient taking differently: Take 0.25 mg by mouth 2 (two) times daily. 1100 and 1500 And an extra 0.61m oral twice daily as needed for anxiety) 60 tablet 0   AMBULATORY NON FORMULARY MEDICATION One four wheel rolling walker with seat and hand brakes. Brand/style covered by insurance. Medically necessary for ICD-10: R26.81, Z74.1 1 Units 0   amLODIPine-Valsartan-HCTZ 5-160-12.5 MG TABS Take 2 tablets by mouth daily. 180 tablet 1   ARTHRITIS PAIN RELIEVING 0.075 % topical cream Apply 1 application topically 3 (three) times daily as needed (neuropathic pain in feet).     atorvastatin (LIPITOR) 10 MG tablet TAKE (1) TABLET BY MOUTH ONCE DAILY. 30 tablet 10   Bacillus Coagulans-Inulin (PROBIOTIC FORMULA) 1-250 BILLION-MG CAPS TAKE 1 CAPSULE (250MG) BY MOUTH 2 TIMES A DAY. 60 capsule 11   blood glucose meter kit and supplies KIT Meter, test strips, lancets, and alcohol swabs. Use for testing blood sugar every morning before meals and up to 3 additional times per day as needed. Brand based on patient and insurance coverage. 100 strips with 99 refills, 100 lancets with 99 refills, 200 alcohol swabs with 99 refills. Dx: E11.65 1 each 99   camphor-menthol (SARNA) lotion Apply 1 application topically as needed for itching. (Patient taking differently: Apply 1 application  topically 2 (two) times daily as needed for itching.) 222 mL 0   carvedilol (COREG) 6.25 MG tablet TAKE (1) TABLET BY MOUTH TWICE DAILY WITH A MEAL. 180 tablet 3   cephALEXin (KEFLEX) 250 MG capsule Take 1 capsule (250 mg total) by mouth daily. (Patient not taking: Reported on 10/18/2021) 30 capsule 5   ciprofloxacin (CIPRO) 250 MG tablet Take 1 tablet (250 mg total) by mouth 2 (two) times daily for 5 days. 10 tablet 0   cloNIDine (CATAPRES) 0.1 MG tablet Take 1 tablet (0.1 mg total) by mouth 3 (three) times  daily as needed (systolic blood pressurre above 190). 15 tablet 0   cloNIDine  (CATAPRES) 0.2 MG tablet TAKE (1) TABLET BY MOUTH ONCE EVERY HOUR AS NEEDED FOR SBP>190. **MAX 3 TABLETS (0.6MG) IN 24 HOURS** 30 tablet 3   dapagliflozin propanediol (FARXIGA) 10 MG TABS tablet Take 1 tablet (10 mg total) by mouth daily before breakfast. 90 tablet 3   diphenhydrAMINE (BENADRYL) 25 mg capsule Take 1 capsule (25 mg total) by mouth every 8 (eight) hours as needed for itching or allergies. 30 capsule 1   EASYMAX TEST test strip      ELIQUIS 5 MG TABS tablet TAKE (1) TABLET BY MOUTH TWICE DAILY. 60 tablet 10   Estradiol 10 MCG TABS vaginal tablet Place 1 tab nightly for twice a week 8 tablet 11   famotidine (PEPCID) 20 MG tablet Take 1 tablet (20 mg total) by mouth daily as needed for heartburn or indigestion. 90 tablet 3   FEROSUL 325 (65 Fe) MG tablet TAKE (1) TABLET BY MOUTH ONCE DAILY. 30 tablet 10   fluconazole (DIFLUCAN) 150 MG tablet Take 1 tablet (150 mg total) by mouth once as needed for up to 1 dose. 1 tablet 1   hydrALAZINE (APRESOLINE) 25 MG tablet TAKE 1 TABLET BY MOUTH TWICE A DAY. 60 tablet 10   Lactase 9000 units TABS Take one tablet with the first bite of meals containing milk or dairy products. (Patient taking differently: Take 9,000 Units by mouth See admin instructions. Take 9000units with the first bite of meals containing milk or dairy products.) 90 tablet 11   mirabegron ER (MYRBETRIQ) 25 MG TB24 tablet Take 1 tablet (25 mg total) by mouth daily. 30 tablet 5   mirtazapine (REMERON) 7.5 MG tablet Take 1 tablet (7.5 mg total) by mouth at bedtime. For depression and anxiety symptoms 30 tablet 2   pantoprazole (PROTONIX) 40 MG tablet TAKE (1) TABLET BY MOUTH EVERY MORNING FOR GERD. 30 tablet 10   PARoxetine (PAXIL) 40 MG tablet Take 40 mg by mouth daily.     No facility-administered medications prior to visit.    Review of Systems  Constitutional:  Negative for chills, fever, malaise/fatigue and weight loss.  HENT:  Negative for hearing loss, sore throat and  tinnitus.   Eyes:  Negative for blurred vision and double vision.  Respiratory:  Positive for shortness of breath. Negative for cough, hemoptysis, sputum production, wheezing and stridor.   Cardiovascular:  Negative for chest pain, palpitations, orthopnea, leg swelling and PND.  Gastrointestinal:  Negative for abdominal pain, constipation, diarrhea, heartburn, nausea and vomiting.  Genitourinary:  Negative for dysuria, hematuria and urgency.  Musculoskeletal:  Negative for joint pain and myalgias.  Skin:  Negative for itching and rash.  Neurological:  Negative for dizziness, tingling, weakness and headaches.  Endo/Heme/Allergies:  Negative for environmental allergies. Does not bruise/bleed easily.  Psychiatric/Behavioral:  Negative for depression. The patient is not nervous/anxious and does not have insomnia.   All other systems reviewed and are negative.    Objective:  Physical Exam Vitals reviewed.  Constitutional:      General: She is not in acute distress.    Appearance: She is well-developed. She is obese.  HENT:     Head: Normocephalic and atraumatic.  Eyes:     General: No scleral icterus.    Conjunctiva/sclera: Conjunctivae normal.     Pupils: Pupils are equal, round, and reactive to light.  Neck:     Vascular: No JVD.  Trachea: No tracheal deviation.  Cardiovascular:     Rate and Rhythm: Normal rate and regular rhythm.     Heart sounds: Normal heart sounds. No murmur heard. Pulmonary:     Effort: Pulmonary effort is normal. No tachypnea, accessory muscle usage or respiratory distress.     Breath sounds: No stridor. No wheezing, rhonchi or rales.  Abdominal:     General: There is no distension.     Palpations: Abdomen is soft.     Tenderness: There is no abdominal tenderness.  Musculoskeletal:        General: No tenderness.     Cervical back: Neck supple.     Right lower leg: No edema.     Left lower leg: No edema.  Lymphadenopathy:     Cervical: No cervical  adenopathy.  Skin:    General: Skin is warm and dry.     Capillary Refill: Capillary refill takes less than 2 seconds.     Findings: No rash.  Neurological:     Mental Status: She is alert and oriented to person, place, and time.  Psychiatric:        Behavior: Behavior normal.      Vitals:   10/24/21 1349  BP: 100/70  Pulse: 79  SpO2: 95%  Weight: 266 lb 3.2 oz (120.7 kg)  Height: '5\' 5"'  (1.651 m)   95% on RA  BMI Readings from Last 3 Encounters:  10/24/21 44.30 kg/m  10/18/21 44.10 kg/m  07/05/21 43.94 kg/m   Wt Readings from Last 3 Encounters:  10/24/21 266 lb 3.2 oz (120.7 kg)  10/18/21 265 lb (120.2 kg)  07/05/21 256 lb (116.1 kg)     CBC    Component Value Date/Time   WBC 8.7 07/05/2021 1021   WBC 11.8 (H) 03/13/2021 0221   RBC 5.01 07/05/2021 1021   RBC 4.12 03/13/2021 0221   HGB 12.8 07/05/2021 1021   HCT 41.0 07/05/2021 1021   PLT 275 07/05/2021 1021   MCV 82 07/05/2021 1021   MCH 25.5 (L) 07/05/2021 1021   MCH 19.9 (L) 03/13/2021 0221   MCHC 31.2 (L) 07/05/2021 1021   MCHC 28.8 (L) 03/13/2021 0221   RDW 16.3 (H) 07/05/2021 1021   LYMPHSABS 1.3 07/05/2021 1021   MONOABS 0.6 03/13/2021 0221   EOSABS 0.2 07/05/2021 1021   BASOSABS 0.0 07/05/2021 1021      Chest Imaging: June 2022 CT chest: Small 7 mm right lower lobe pulmonary nodule adjacent to the pleura.  Slowly enlarging.  Last CT scan was in November 2020.  It has gotten bigger since 2020. The patient's images have been independently reviewed by me.    July 2023: CT chest: Right upper lobe pulmonary nodule is slightly more solid in nature in comparison to previous. Right lower lobe pulmonary nodule stable. The patient's images have been independently reviewed by me.    Pulmonary Functions Testing Results:    Latest Ref Rng & Units 09/02/2020   11:23 AM  PFT Results  FVC-Pre L 1.73   FVC-Predicted Pre % 63   FVC-Post L 1.81   FVC-Predicted Post % 66   Pre FEV1/FVC % % 80   Post  FEV1/FCV % % 84   FEV1-Pre L 1.39   FEV1-Predicted Pre % 68   FEV1-Post L 1.52   DLCO uncorrected ml/min/mmHg 14.28   DLCO UNC% % 74   DLCO corrected ml/min/mmHg 14.28   DLCO COR %Predicted % 74   DLVA Predicted % 97   TLC  L 5.24   TLC % Predicted % 102   RV % Predicted % 131     FeNO:   Pathology:   Echocardiogram:   Heart Catheterization:     Assessment & Plan:     ICD-10-CM   1. Lung nodule  R91.1 CT Super D Chest Wo Contrast    2. Nodule of upper lobe of right lung  R91.1     3. Right lower lobe pulmonary nodule  R91.1     4. Chronic obstructive pulmonary disease, unspecified COPD type ([place])  J44.9       Discussion:  This is an 81 year old female, new right lower lobe pulmonary nodule has been slowly enlarging somewhat since 2020.  This appears stable on recent follow-up CT imaging.  She has a right upper lobe more groundglass lung nodule that was identified on previous CT last year that has showed change in morphology to a more solid appearing lesion.  All of these are subcentimeter in size in the right lower lobe lobe is stable in the right upper lobe has changed but it still less than 5 mm.  She is doing well on her Trelegy.  Plan: Continue Trelegy Refills given as needed for this. She can follow-up with [place] in January. She needs a repeat noncontrasted CT chest in January 2024. This will be 6 months from her previous CT chest. This will ensure that the nodule that is in the right upper lobe that has changed some remained stable. I did explain if it continues to grow in size we may need to consider navigational bronchoscopy tissue sampling fiducial placement and then referral for SBRT if in fact this is an indolent neoplasm. She understands this and she would prefer to watch over the next 6 months to see how it behaves.     Current Outpatient Medications:    albuterol (VENTOLIN HFA) 108 (90 Base) MCG/ACT inhaler, Inhale 1-2 puffs into the lungs every 4 (four)  hours as needed for wheezing or shortness of breath., Disp: 18 g, Rfl: 1   cetirizine (ZYRTEC) 10 MG tablet, TAKE 1 TABLET BY MOUTH ONCE A DAY., Disp: 30 tablet, Rfl: 10   guaiFENesin (MUCINEX) 600 MG 12 hr tablet, Take 2 tablets (1,200 mg total) by mouth 2 (two) times daily as needed for to loosen phlegm or cough. Use for 7 days then as needed if cough is improving., Disp: 30 tablet, Rfl: 1   TRELEGY ELLIPTA 100-62.5-25 MCG/ACT AEPB, INHALE 1 PUFF INTO THE LUNGS DAILY. ** RINSE MOUTH AFTER USE** (Patient taking differently: Inhale 1 puff into the lungs daily.), Disp: 60 each, Rfl: 0   ALPRAZolam (XANAX) 0.25 MG tablet, TAKE (1) TABLET BY MOUTH TWICE DAILY AT 11AM & 5PM FOR ANXIETY. (Patient taking differently: Take 0.25 mg by mouth 2 (two) times daily. 1100 and 1500 And an extra 0.52m oral twice daily as needed for anxiety), Disp: 60 tablet, Rfl: 0   AMBULATORY NON FORMULARY MEDICATION, One four wheel rolling walker with seat and hand brakes. Brand/style covered by insurance. Medically necessary for ICD-10: R26.81, Z74.1, Disp: 1 Units, Rfl: 0   amLODIPine-Valsartan-HCTZ 5-160-12.5 MG TABS, Take 2 tablets by mouth daily., Disp: 180 tablet, Rfl: 1   ARTHRITIS PAIN RELIEVING 0.075 % topical cream, Apply 1 application topically 3 (three) times daily as needed (neuropathic pain in feet)., Disp: , Rfl:    atorvastatin (LIPITOR) 10 MG tablet, TAKE (1) TABLET BY MOUTH ONCE DAILY., Disp: 30 tablet, Rfl: 10   Bacillus Coagulans-Inulin (PROBIOTIC  FORMULA) 1-250 BILLION-MG CAPS, TAKE 1 CAPSULE (250MG) BY MOUTH 2 TIMES A DAY., Disp: 60 capsule, Rfl: 11   blood glucose meter kit and supplies KIT, Meter, test strips, lancets, and alcohol swabs. Use for testing blood sugar every morning before meals and up to 3 additional times per day as needed. Brand based on patient and insurance coverage. 100 strips with 99 refills, 100 lancets with 99 refills, 200 alcohol swabs with 99 refills. Dx: E11.65, Disp: 1 each, Rfl: 99    camphor-menthol (SARNA) lotion, Apply 1 application topically as needed for itching. (Patient taking differently: Apply 1 application  topically 2 (two) times daily as needed for itching.), Disp: 222 mL, Rfl: 0   carvedilol (COREG) 6.25 MG tablet, TAKE (1) TABLET BY MOUTH TWICE DAILY WITH A MEAL., Disp: 180 tablet, Rfl: 3   cephALEXin (KEFLEX) 250 MG capsule, Take 1 capsule (250 mg total) by mouth daily. (Patient not taking: Reported on 10/18/2021), Disp: 30 capsule, Rfl: 5   ciprofloxacin (CIPRO) 250 MG tablet, Take 1 tablet (250 mg total) by mouth 2 (two) times daily for 5 days., Disp: 10 tablet, Rfl: 0   cloNIDine (CATAPRES) 0.1 MG tablet, Take 1 tablet (0.1 mg total) by mouth 3 (three) times daily as needed (systolic blood pressurre above 190)., Disp: 15 tablet, Rfl: 0   cloNIDine (CATAPRES) 0.2 MG tablet, TAKE (1) TABLET BY MOUTH ONCE EVERY HOUR AS NEEDED FOR SBP>190. **MAX 3 TABLETS (0.6MG) IN 24 HOURS**, Disp: 30 tablet, Rfl: 3   dapagliflozin propanediol (FARXIGA) 10 MG TABS tablet, Take 1 tablet (10 mg total) by mouth daily before breakfast., Disp: 90 tablet, Rfl: 3   diphenhydrAMINE (BENADRYL) 25 mg capsule, Take 1 capsule (25 mg total) by mouth every 8 (eight) hours as needed for itching or allergies., Disp: 30 capsule, Rfl: 1   EASYMAX TEST test strip, , Disp: , Rfl:    ELIQUIS 5 MG TABS tablet, TAKE (1) TABLET BY MOUTH TWICE DAILY., Disp: 60 tablet, Rfl: 10   Estradiol 10 MCG TABS vaginal tablet, Place 1 tab nightly for twice a week, Disp: 8 tablet, Rfl: 11   famotidine (PEPCID) 20 MG tablet, Take 1 tablet (20 mg total) by mouth daily as needed for heartburn or indigestion., Disp: 90 tablet, Rfl: 3   FEROSUL 325 (65 Fe) MG tablet, TAKE (1) TABLET BY MOUTH ONCE DAILY., Disp: 30 tablet, Rfl: 10   fluconazole (DIFLUCAN) 150 MG tablet, Take 1 tablet (150 mg total) by mouth once as needed for up to 1 dose., Disp: 1 tablet, Rfl: 1   hydrALAZINE (APRESOLINE) 25 MG tablet, TAKE 1 TABLET BY MOUTH  TWICE A DAY., Disp: 60 tablet, Rfl: 10   Lactase 9000 units TABS, Take one tablet with the first bite of meals containing milk or dairy products. (Patient taking differently: Take 9,000 Units by mouth See admin instructions. Take 9000units with the first bite of meals containing milk or dairy products.), Disp: 90 tablet, Rfl: 11   mirabegron ER (MYRBETRIQ) 25 MG TB24 tablet, Take 1 tablet (25 mg total) by mouth daily., Disp: 30 tablet, Rfl: 5   mirtazapine (REMERON) 7.5 MG tablet, Take 1 tablet (7.5 mg total) by mouth at bedtime. For depression and anxiety symptoms, Disp: 30 tablet, Rfl: 2   pantoprazole (PROTONIX) 40 MG tablet, TAKE (1) TABLET BY MOUTH EVERY MORNING FOR GERD., Disp: 30 tablet, Rfl: 10   PARoxetine (PAXIL) 40 MG tablet, Take 40 mg by mouth daily., Disp: , Rfl:    Prisha Hiley L  Markeita Alicia, DO [facility] 10/24/2021 2:14 PM

## 2021-10-25 ENCOUNTER — Ambulatory Visit (HOSPITAL_BASED_OUTPATIENT_CLINIC_OR_DEPARTMENT_OTHER): Payer: Medicare HMO | Admitting: Physical Therapy

## 2021-10-25 ENCOUNTER — Encounter (HOSPITAL_BASED_OUTPATIENT_CLINIC_OR_DEPARTMENT_OTHER): Payer: Self-pay | Admitting: Physical Therapy

## 2021-10-25 DIAGNOSIS — M25551 Pain in right hip: Secondary | ICD-10-CM

## 2021-10-25 DIAGNOSIS — R2689 Other abnormalities of gait and mobility: Secondary | ICD-10-CM | POA: Diagnosis not present

## 2021-10-25 NOTE — Therapy (Signed)
Therapist)               PT End of Session - 10/25/21 1438     Visit Number 12    Number of Visits 19    Date for PT Re-Evaluation 12/02/21    Authorization Type Aetna Medicare ( no [place])    PT Start Time 1433    PT Stop Time 1513    PT Time Calculation (min) 40 min    Activity Tolerance Patient tolerated treatment well    Behavior During Therapy [facility] for tasks assessed/performed                   OUTPATIENT PHYSICAL THERAPY LOWER EXTREMITY Treatment      Patient Name: [name] [name] MRN: [id number] DOB:[birth year], 81 y.o., female Today's Date: 07/19/2021           Past Medical History:  Diagnosis Date   Acute non-recurrent pansinusitis 04/20/2021   Acute respiratory failure with hypoxia ([place]) 03/10/2021   Allergy     Anxiety     Arthritis     At risk for allergic reaction to medication 02/04/2021   Blood transfusion without reported diagnosis     Cancer ([place])      Melanoma on Great toe   Candida infection 04/20/2021   Carotid artery plaque, 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   Cataract     Closed fracture of upper end of humerus 11/03/2017   COPD (chronic obstructive pulmonary disease) ([place])     COVID-19 vaccine series started 07/01/2020   COVID-19 virus infection 03/09/2021   Depression     Encounter for Medicare annual wellness exam 07/01/2020   Encounter to establish care 06/03/2020   Essential hypertension 05/19/2019   Essential hypertension 05/19/2019   Female stress incontinence 02/14/2018   GERD (gastroesophageal reflux disease)     History of opioid abuse ([place]) 05/19/2019   Hospital discharge follow-up 03/24/2021   Hyperglycemia 05/19/2019   Hyperlipidemia     Hypertension     Irritation of right eye 12/09/2020   Menopause present 02/11/2018   Need for shingles vaccine 07/01/2020   Osteoporosis     Prediabetes 05/22/2019   Sleep apnea     Subacute cough 03/24/2021   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         Past Surgical History:  Procedure Laterality Date    [place] Right      Great Toe   REPLACEMENT TOTAL KNEE Bilateral 2000   sacral neuromodulation            Patient Active Problem List    Diagnosis Date Noted   Gait instability 07/05/2021   PAD (peripheral artery disease) ([place]) 05/10/2021   Lymphadenopathy, inguinal 05/05/2021   Type 2 diabetes mellitus with hyperglycemia, without long-term current use of insulin ([place]) 03/24/2021   Hypertension associated with diabetes ([place]) 02/04/2021   Lactose intolerance 11/11/2020   Pulmonary nodules 10/21/2020   Neuropathy 07/01/2020   Encounter for medication management 06/03/2020   Coronary artery disease involving native heart without angina pectoris 12/18/2019   History of malignant melanoma 12/18/2019   Basal cell carcinoma of skin 12/18/2019   History of surgery for cerebral aneurysm 12/18/2019   Hyperlipidemia associated with type 2 diabetes mellitus (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 05/19/2019   Bilateral carotid artery occlusion  05/19/2019   Anemia 05/19/2019   Bipolar disorder ([place]) 05/19/2019   Chronic obstructive lung disease ([place]) 05/19/2019   Chronic pain syndrome 05/19/2019   Gastroesophageal reflux disease without esophagitis 05/19/2019   Generalized anxiety disorder 05/19/2019   Hypertensive heart disease without congestive heart failure 05/19/2019   Obstructive sleep apnea syndrome 05/19/2019   Polyp of colon 05/19/2019   Depression, major, single episode, moderate ([place]) 05/19/2019   Spinal stenosis in cervical region 05/19/2019   History of TIA (transient ischemic attack) 05/19/2019   Mixed urinary incontinence due to female genital prolapse 05/19/2019   Tricuspid valve disorders, 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   Panic disorder with agoraphobia 02/22/2018    Diverticulosis of large intestine 02/14/2018   Localized, primary osteoarthritis of shoulder region 02/14/2018   Mitral valve regurgitation 02/14/2018   Grade I internal hemorrhoids 02/14/2018   Bilateral carpal tunnel syndrome 02/11/2018   Morbid obesity ([place]) 02/11/2018   Nondependent opioid abuse in remission ([place]) 02/11/2018      PCP: Orma Render, NP   REFERRING PROVIDER: Orma Render, NP   REFERRING DIAG:  R26.81 (ICD-10-CM) - Gait instability  Z74.1 (ICD-10-CM) - Dependent for walking      THERAPY DIAG:  Other abnormalities of gait and mobility   Pain in right hip   Rationale for Evaluation and Treatment Rehabilitation   ONSET DATE: Jan 2023   SUBJECTIVE:    SUBJECTIVE STATEMENT: The patient did better after the last visit. She had no significant pain. She reports her shoulder is sore today.   PERTINENT HISTORY: Bil femur fractures, T2DM, CAD, cancer (melanoma) and subsequent R great toe amputation, anxiety   PAIN:  Are you having pain? Yes: NPRS scale: 2/10 Pain location: right foot  Pain description: aching  Aggravating factors: Standing  Relieving factors: rest    PRECAUTIONS: None   WEIGHT BEARING RESTRICTIONS No   FALLS:  Has patient fallen in last 6 months? No   LIVING ENVIRONMENT: Lives with: lives in an assisted living facility Lives in: House/apartment Stairs: No Has following equipment at home: Gilford Rile - 4 wheeled   OCCUPATION: Retired PTA   PLOF: Independent   PATIENT GOALS: Wants to improve walking/fitness and be able to visit her sister in [place] in October.     OBJECTIVE:    DIAGNOSTIC FINDINGS:  None   PATIENT SURVEYS:  FOTO 44                        Expected 61 in 11 visits   COGNITION:           Overall cognitive status: Within functional limits for tasks assessed                          SENSATION: Impaired - pt reports decreased sensation in bil feet from T2DM neuropathy     POSTURE: rounded shoulders, forward  head, and flexed trunk    PALPATION: Tender to palpation of glute med/min, piriformis, IT band   LOWER EXTREMITY ROM:   Active ROM Right eval Left eval Right  9/14 Left 9/14  Hip flexion Limited, concordant pain Limited Improved flexion to 105  Improved flexion to 105   Hip extension        Hip abduction        Hip adduction        Hip internal rotation Painful      Hip external rotation Painful      Knee  flexion        Knee extension Painful      Ankle dorsiflexion Limited Limited    Ankle plantarflexion        Ankle inversion        Ankle eversion         (Blank rows = not tested)   LOWER EXTREMITY MMT:   MMT Right eval Left eval Right 7/27 Left 7/27 Right 9/14  Left  9/14  Hip flexion 26.2 36.1 25.7 33.8 28.3 36.2  Hip extension          Hip abduction 21 27.1 not able to maintain hold  25.7 27 28.2 29.3  Hip adduction          Hip internal rotation          Hip external rotation          Knee flexion          Knee extension 23 16.5 30.1 29.5    Ankle dorsiflexion          Ankle plantarflexion          Ankle inversion          Ankle eversion           (Blank rows = not tested)       FUNCTIONAL TESTS:  Timed up and go (TUG): 21sec -Pt required use of UE to get out of chair  Not tested this visit      TODAY'S TREATMENT: 9/19 Nu-step 5 min L3   Step up 2x10 4 inch each leg  Lateral step up 2x10 4 inch left leg only    Seated march 2x20  green Seated LAQ 2x15 each leg red  Seated clam shell 3x15 green   Standing weight shift x20     9/14 Nu-step 5 min L3   Step up 2x10 4 inch each leg  Lateral step up 2x10 4 inch left leg only   Seated march 2x20  Seated LAQ 2x15 each leg  Seated clam shell 3x15 red       9/7 Manual Therapy: trigger point rlease to gluteal in supine; LAD to right leg in supine   Quad set 2x15 bilateral  SAQ 2x01 bilateral. At this time the patient began reporting an increase in pain. She sat up and the pain  remained. She reports everything is hurting today.   8/31 Seated Shoulder flexion 2x10 1lbs  Seated Shoulder Y's 2x10 1lbs  Biceps curl 2x10 1lb   Row 2x10 yellow  Shoulder extension yellow 2x10   Step up 2x10 4 inch  Lateral step up 2x10 2 inch           PATIENT EDUCATION:  Education details: HEP, exercise, safety with transfers Person educated: Patient Education method: Explanation Education comprehension: verbalized understanding     HOME EXERCISE PROGRAM: -Gentle stretching           -Knee-to-chest           -Piriformis stretch -Self-release of trigger points   ASSESSMENT:   CLINICAL IMPRESSION: The patient tolerated exercises well. She had no significant increase in pain with steps and LE strengthening. She was having some shoulder pain as well. We reviewed an exercise that she does at home. She reported it felt OK> We stukc with a 4 inch step today. She had mild pain with a 4 inch step.   OBJECTIVE IMPAIRMENTS decreased balance, decreased endurance, difficulty walking, decreased ROM, and decreased strength.    ACTIVITY LIMITATIONS  bending, standing, stairs, transfers, and bed mobility   PARTICIPATION LIMITATIONS: shopping and community activity   PERSONAL FACTORS Age, Fitness, Time since onset of injury/illness/exacerbation, and 3+ comorbidities: (see PMH)  are also affecting patient's functional outcome.    REHAB POTENTIAL: Good   CLINICAL DECISION MAKING: Evolving/moderate complexity   EVALUATION COMPLEXITY: Moderate     GOALS: Goals reviewed with patient? Yes   SHORT TERM GOALS: Target date: 08/09/2021 assessed on 9/14 Pt will improve hip flexion and ankle dorsiflexion ROM to [facility] to improve bed mobility and gait mechanics. Baseline: Goal status: no pain with hip flexion >100 degrees achieved    2.  Pt will be able to transfer from supine to seated and from sit-standing without pain or assistance.  Baseline:  Goal status: Improving but still has  pain. Needs to work on technique    3.  Pt will improve bil hip abduction strength by 20%. Baseline:  Goal status: Improving but goal not mer      LONG TERM GOALS: Target date: 11/24/2021 9/14   Pt will improve TUG score by MCID of 3.4 seconds to demonstrate a reduced risk for falls. Baseline:  Goal status: not tested    2.  Pt will be able to stand or ambulate 20 minutes with normalized gait mechanics without pain. Baseline:  Goal status: improved but foot limiting gait mechanics    3.  Pt will be fully independent with exercise program to maintain current level of fitness. Baseline:  Goal status: working on HEP      PLAN: PT FREQUENCY: 1-2x/week   PT DURATION: 6 weeks   PLANNED INTERVENTIONS: Therapeutic exercises, Therapeutic activity, Neuromuscular re-education, Balance training, Gait training, Patient/Family education, Joint mobilization, Stair training, Aquatic Therapy, Dry Needling, Cryotherapy, and Moist heat   PLAN FOR NEXT SESSION: Continue STM as indicated. Lateral hip, core, ankle strengthening, gait training. Consider aerobic endurance assessment. Assess tolerance to met pad                     Carolyne Littles PT DPT  08/23/2021

## 2021-10-26 ENCOUNTER — Encounter (HOSPITAL_BASED_OUTPATIENT_CLINIC_OR_DEPARTMENT_OTHER): Payer: Self-pay | Admitting: Physical Therapy

## 2021-10-27 ENCOUNTER — Encounter (HOSPITAL_BASED_OUTPATIENT_CLINIC_OR_DEPARTMENT_OTHER): Payer: Self-pay | Admitting: Physical Therapy

## 2021-10-27 ENCOUNTER — Ambulatory Visit (HOSPITAL_BASED_OUTPATIENT_CLINIC_OR_DEPARTMENT_OTHER): Payer: Medicare HMO | Admitting: Physical Therapy

## 2021-10-27 DIAGNOSIS — R2689 Other abnormalities of gait and mobility: Secondary | ICD-10-CM

## 2021-10-27 DIAGNOSIS — M25551 Pain in right hip: Secondary | ICD-10-CM

## 2021-10-27 NOTE — Therapy (Addendum)
Therapist)               PT End of Session - 10/27/21 1359     Visit Number 13    Number of Visits 19    Date for PT Re-Evaluation 12/02/21    Authorization Type Aetna Medicare ( no [place])    PT Start Time 1356    Activity Tolerance Patient tolerated treatment well    Behavior During Therapy [facility] for tasks assessed/performed                   OUTPATIENT PHYSICAL THERAPY LOWER EXTREMITY Treatment      Patient Name: [name] [name] MRN: [id number] DOB:Aug 02, 1940, 81 y.o., female Today's Date: 07/19/2021           Past Medical History:  Diagnosis Date   Acute non-recurrent pansinusitis 04/20/2021   Acute respiratory failure with hypoxia ([place]) 03/10/2021   Allergy     Anxiety     Arthritis     At risk for allergic reaction to medication 02/04/2021   Blood transfusion without reported diagnosis     Cancer ([place])      Melanoma on Great toe   Candida infection 04/20/2021   Carotid artery plaque, 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   Cataract     Closed fracture of upper end of humerus 11/03/2017   COPD (chronic obstructive pulmonary disease) ([place])     COVID-19 vaccine series started 07/01/2020   COVID-19 virus infection 03/09/2021   Depression     Encounter for Medicare annual wellness exam 07/01/2020   Encounter to establish care 06/03/2020   Essential hypertension 05/19/2019   Essential hypertension 05/19/2019   Female stress incontinence 02/14/2018   GERD (gastroesophageal reflux disease)     History of opioid abuse ([place]) 05/19/2019   Hospital discharge follow-up 03/24/2021   Hyperglycemia 05/19/2019   Hyperlipidemia     Hypertension     Irritation of right eye 12/09/2020   Menopause present 02/11/2018   Need for shingles vaccine 07/01/2020   Osteoporosis     Prediabetes 05/22/2019   Sleep apnea     Subacute cough 03/24/2021   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         Past Surgical History:  Procedure Laterality Date   [place] Right      Great Toe   REPLACEMENT TOTAL KNEE Bilateral 2000   sacral neuromodulation            Patient Active Problem List    Diagnosis Date Noted   Gait instability 07/05/2021   PAD (peripheral artery disease) ([place]) 05/10/2021   Lymphadenopathy, inguinal 05/05/2021   Type 2 diabetes mellitus with hyperglycemia, without long-term current use of insulin ([place]) 03/24/2021   Hypertension associated with diabetes ([place]) 02/04/2021   Lactose intolerance 11/11/2020   Pulmonary nodules 10/21/2020   Neuropathy 07/01/2020   Encounter for medication management 06/03/2020   Coronary artery disease involving native heart without angina pectoris 12/18/2019   History of malignant melanoma 12/18/2019   Basal cell carcinoma of skin 12/18/2019   History of surgery for cerebral aneurysm 12/18/2019   Hyperlipidemia associated with type 2 diabetes mellitus ([place]) 05/22/2019   Adjustment disorder with mixed anxiety and depressed mood 05/19/2019   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 05/19/2019   Bilateral carotid artery occlusion 05/19/2019   Anemia 05/19/2019   Bipolar disorder ([place]) 05/19/2019   Chronic obstructive lung  disease ([place]) 05/19/2019   Chronic pain syndrome 05/19/2019   Gastroesophageal reflux disease without esophagitis 05/19/2019   Generalized anxiety disorder 05/19/2019   Hypertensive heart disease without congestive heart failure 05/19/2019   Obstructive sleep apnea syndrome 05/19/2019   Polyp of colon 05/19/2019   Depression, major, single episode, moderate ([place]) 05/19/2019   Spinal stenosis in cervical region 05/19/2019   History of TIA (transient ischemic attack) 05/19/2019   Mixed urinary incontinence due to female genital prolapse 05/19/2019   Tricuspid valve disorders, 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   Panic disorder with agoraphobia 02/22/2018   Diverticulosis of large intestine 02/14/2018   Localized, primary  osteoarthritis of shoulder region 02/14/2018   Mitral valve regurgitation 02/14/2018   Grade I internal hemorrhoids 02/14/2018   Bilateral carpal tunnel syndrome 02/11/2018   Morbid obesity ([place]) 02/11/2018   Nondependent opioid abuse in remission ([place]) 02/11/2018      PCP: Orma Render, NP   REFERRING PROVIDER: Orma Render, NP   REFERRING DIAG:  R26.81 (ICD-10-CM) - Gait instability  Z74.1 (ICD-10-CM) - Dependent for walking      THERAPY DIAG:  Other abnormalities of gait and mobility   Pain in right hip   Rationale for Evaluation and Treatment Rehabilitation   ONSET DATE: Jan 2023   SUBJECTIVE:    SUBJECTIVE STATEMENT: The patient reports she has had some shoulder pain. Her transportation was late. She reports her hip tolerated the treatment well the last visit.  PERTINENT HISTORY: Bil femur fractures, T2DM, CAD, cancer (melanoma) and subsequent R great toe amputation, anxiety   PAIN:  Are you having pain? Yes: NPRS scale: 2/10 Pain location: right foot  Pain description: aching  Aggravating factors: Standing  Relieving factors: rest    PRECAUTIONS: None   WEIGHT BEARING RESTRICTIONS No   FALLS:  Has patient fallen in last 6 months? No   LIVING ENVIRONMENT: Lives with: lives in an assisted living facility Lives in: House/apartment Stairs: No Has following equipment at home: Gilford Rile - 4 wheeled   OCCUPATION: Retired PTA   PLOF: Independent   PATIENT GOALS: Wants to improve walking/fitness and be able to visit her sister in [place] in October.     OBJECTIVE:    DIAGNOSTIC FINDINGS:  None   PATIENT SURVEYS:  FOTO 44                        Expected 61 in 11 visits   COGNITION:           Overall cognitive status: Within functional limits for tasks assessed                          SENSATION: Impaired - pt reports decreased sensation in bil feet from T2DM neuropathy     POSTURE: rounded shoulders, forward head, and flexed trunk     PALPATION: Tender to palpation of glute med/min, piriformis, IT band   LOWER EXTREMITY ROM:   Active ROM Right eval Left eval Right  9/14 Left 9/14  Hip flexion Limited, concordant pain Limited Improved flexion to 105  Improved flexion to 105   Hip extension        Hip abduction        Hip adduction        Hip internal rotation Painful      Hip external rotation Painful      Knee flexion        Knee extension Painful  Ankle dorsiflexion Limited Limited    Ankle plantarflexion        Ankle inversion        Ankle eversion         (Blank rows = not tested)   LOWER EXTREMITY MMT:   MMT Right eval Left eval Right 7/27 Left 7/27 Right 9/14  Left  9/14  Hip flexion 26.2 36.1 25.7 33.8 28.3 36.2  Hip extension          Hip abduction 21 27.1 not able to maintain hold  25.7 27 28.2 29.3  Hip adduction          Hip internal rotation          Hip external rotation          Knee flexion          Knee extension 23 16.5 30.1 29.5    Ankle dorsiflexion          Ankle plantarflexion          Ankle inversion          Ankle eversion           (Blank rows = not tested)       FUNCTIONAL TESTS:  Timed up and go (TUG): 21sec -Pt required use of UE to get out of chair  Not tested this visit      TODAY'S TREATMENT: 9/21 Nu-step 5 min L3  Standing weight shift x20   Step up 2x10 4 inch each leg  Lateral step up 2x10 4 inch left leg only   Bilateral ER 2x10  Bilateral horizontal abduction 2x10 Bilateral flexion 2x10   Standing weight shift x20    9/19 Nu-step 5 min L3   Step up 2x10 4 inch each leg  Lateral step up 2x10 4 inch left leg only    Seated march 2x20  green Seated LAQ 2x15 each leg red  Seated clam shell 3x15 green   Standing weight shift x20     9/14 Nu-step 5 min L3   Step up 2x10 4 inch each leg  Lateral step up 2x10 4 inch left leg only   Seated march 2x20  Seated LAQ 2x15 each leg  Seated clam shell 3x15 red        9/7 Manual Therapy: trigger point rlease to gluteal in supine; LAD to right leg in supine   Quad set 2x15 bilateral  SAQ 2x01 bilateral. At this time the patient began reporting an increase in pain. She sat up and the pain remained. She reports everything is hurting today.   8/31 Seated Shoulder flexion 2x10 1lbs  Seated Shoulder Y's 2x10 1lbs  Biceps curl 2x10 1lb   Row 2x10 yellow  Shoulder extension yellow 2x10   Step up 2x10 4 inch  Lateral step up 2x10 2 inch           PATIENT EDUCATION:  Education details: HEP, exercise, safety with transfers Person educated: Patient Education method: Explanation Education comprehension: verbalized understanding     HOME EXERCISE PROGRAM: -Gentle stretching           -Knee-to-chest           -Piriformis stretch -Self-release of trigger points   ASSESSMENT:   CLINICAL IMPRESSION: The patient continues to tolerate treatment well. She was given a seated postural series to work on for home for her UE to help her stand taller and so she doesn't always have to always work her hips. She tolerated stair  training well. Therapy will continue to progress as tolerated.  OBJECTIVE IMPAIRMENTS decreased balance, decreased endurance, difficulty walking, decreased ROM, and decreased strength.    ACTIVITY LIMITATIONS bending, standing, stairs, transfers, and bed mobility   PARTICIPATION LIMITATIONS: shopping and community activity   PERSONAL FACTORS Age, Fitness, Time since onset of injury/illness/exacerbation, and 3+ comorbidities: (see PMH)  are also affecting patient's functional outcome.    REHAB POTENTIAL: Good   CLINICAL DECISION MAKING: Evolving/moderate complexity   EVALUATION COMPLEXITY: Moderate     GOALS: Goals reviewed with patient? Yes   SHORT TERM GOALS: Target date: 08/09/2021 assessed on 9/14 Pt will improve hip flexion and ankle dorsiflexion ROM to [facility] to improve bed mobility and gait  mechanics. Baseline: Goal status: no pain with hip flexion >100 degrees achieved    2.  Pt will be able to transfer from supine to seated and from sit-standing without pain or assistance.  Baseline:  Goal status: Improving but still has pain. Needs to work on technique    3.  Pt will improve bil hip abduction strength by 20%. Baseline:  Goal status: Improving but goal not mer      LONG TERM GOALS: Target date: 11/24/2021 9/14   Pt will improve TUG score by MCID of 3.4 seconds to demonstrate a reduced risk for falls. Baseline:  Goal status: not tested    2.  Pt will be able to stand or ambulate 20 minutes with normalized gait mechanics without pain. Baseline:  Goal status: improved but foot limiting gait mechanics    3.  Pt will be fully independent with exercise program to maintain current level of fitness. Baseline:  Goal status: working on HEP      PLAN: PT FREQUENCY: 1-2x/week   PT DURATION: 6 weeks   PLANNED INTERVENTIONS: Therapeutic exercises, Therapeutic activity, Neuromuscular re-education, Balance training, Gait training, Patient/Family education, Joint mobilization, Stair training, Aquatic Therapy, Dry Needling, Cryotherapy, and Moist heat   PLAN FOR NEXT SESSION: Continue STM as indicated. Lateral hip, core, ankle strengthening, gait training. Consider aerobic endurance assessment. Assess tolerance to met pad                    PHYSICAL THERAPY DISCHARGE SUMMARY  Visits from Start of Care: 13  Current functional level related to goals / functional outcomes: Improved pain    Remaining deficits: Still trouble with steps    Education / Equipment: HEP   Patient agrees to discharge. Patient goals were met. Patient is being discharged due to meeting the stated rehab goals.  Carolyne Littles PT DPT  10/26/2021

## 2021-10-28 ENCOUNTER — Telehealth (HOSPITAL_BASED_OUTPATIENT_CLINIC_OR_DEPARTMENT_OTHER): Payer: Self-pay | Admitting: Nurse Practitioner

## 2021-10-28 NOTE — Telephone Encounter (Signed)
Pt is asking about paperwork --[facility]?

## 2021-10-31 ENCOUNTER — Other Ambulatory Visit (HOSPITAL_BASED_OUTPATIENT_CLINIC_OR_DEPARTMENT_OTHER): Payer: Self-pay

## 2021-10-31 DIAGNOSIS — R600 Localized edema: Secondary | ICD-10-CM

## 2021-11-01 ENCOUNTER — Ambulatory Visit (HOSPITAL_BASED_OUTPATIENT_CLINIC_OR_DEPARTMENT_OTHER): Payer: Medicare HMO

## 2021-11-01 ENCOUNTER — Ambulatory Visit: Payer: Medicare HMO | Admitting: Obstetrics and Gynecology

## 2021-11-01 ENCOUNTER — Encounter (HOSPITAL_BASED_OUTPATIENT_CLINIC_OR_DEPARTMENT_OTHER): Payer: Medicare HMO | Admitting: Physical Therapy

## 2021-11-02 ENCOUNTER — Telehealth (HOSPITAL_BASED_OUTPATIENT_CLINIC_OR_DEPARTMENT_OTHER): Payer: Self-pay | Admitting: Psychologist

## 2021-11-02 LAB — COMPREHENSIVE METABOLIC PANEL
ALT: 16 IU/L (ref 0–32)
ALT: 18 IU/L (ref 0–32)
AST: 21 IU/L (ref 0–40)
AST: 21 IU/L (ref 0–40)
Albumin/Globulin Ratio: 1.5 (ref 1.2–2.2)
Albumin/Globulin Ratio: 1.5 (ref 1.2–2.2)
Albumin: 4.4 g/dL (ref 3.8–4.8)
Albumin: 4.5 g/dL (ref 3.8–4.8)
Alkaline Phosphatase: 129 IU/L — ABNORMAL HIGH (ref 44–121)
Alkaline Phosphatase: 130 IU/L — ABNORMAL HIGH (ref 44–121)
BUN/Creatinine Ratio: 14 (ref 12–28)
BUN/Creatinine Ratio: 16 (ref 12–28)
BUN: 14 mg/dL (ref 8–27)
BUN: 16 mg/dL (ref 8–27)
Bilirubin Total: 0.4 mg/dL (ref 0.0–1.2)
Bilirubin Total: 0.4 mg/dL (ref 0.0–1.2)
CO2: 22 mmol/L (ref 20–29)
CO2: 23 mmol/L (ref 20–29)
Calcium: 9.6 mg/dL (ref 8.7–10.3)
Calcium: 9.6 mg/dL (ref 8.7–10.3)
Chloride: 104 mmol/L (ref 96–106)
Chloride: 105 mmol/L (ref 96–106)
Creatinine, Ser: 0.99 mg/dL (ref 0.57–1.00)
Creatinine, Ser: 0.99 mg/dL (ref 0.57–1.00)
Globulin, Total: 2.9 g/dL (ref 1.5–4.5)
Globulin, Total: 3.1 g/dL (ref 1.5–4.5)
Glucose: 126 mg/dL — ABNORMAL HIGH (ref 70–99)
Glucose: 132 mg/dL — ABNORMAL HIGH (ref 70–99)
Potassium: 4.4 mmol/L (ref 3.5–5.2)
Potassium: 4.5 mmol/L (ref 3.5–5.2)
Sodium: 142 mmol/L (ref 134–144)
Sodium: 142 mmol/L (ref 134–144)
Total Protein: 7.3 g/dL (ref 6.0–8.5)
Total Protein: 7.6 g/dL (ref 6.0–8.5)
eGFR: 58 mL/min/{1.73_m2} — ABNORMAL LOW (ref >59)
eGFR: 58 mL/min/{1.73_m2} — ABNORMAL LOW (ref >59)

## 2021-11-02 LAB — BRAIN NATRIURETIC PEPTIDE: BNP: 20.9 pg/mL (ref 0.0–100.0)

## 2021-11-04 NOTE — Progress Notes (Signed)
Carelink Summary Report / Loop Recorder 

## 2021-11-08 ENCOUNTER — Ambulatory Visit (HOSPITAL_BASED_OUTPATIENT_CLINIC_OR_DEPARTMENT_OTHER): Payer: Medicare HMO | Admitting: Physical Therapy

## 2021-11-08 NOTE — Telephone Encounter (Signed)
Was this completed?

## 2021-11-10 ENCOUNTER — Telehealth (HOSPITAL_BASED_OUTPATIENT_CLINIC_OR_DEPARTMENT_OTHER): Payer: Self-pay

## 2021-11-10 ENCOUNTER — Other Ambulatory Visit (HOSPITAL_BASED_OUTPATIENT_CLINIC_OR_DEPARTMENT_OTHER): Payer: Self-pay

## 2021-11-10 ENCOUNTER — Ambulatory Visit (HOSPITAL_BASED_OUTPATIENT_CLINIC_OR_DEPARTMENT_OTHER): Payer: Medicare HMO | Admitting: Physical Therapy

## 2021-11-10 DIAGNOSIS — M1A0790 Idiopathic chronic gout, unspecified ankle and foot, without tophus (tophi): Secondary | ICD-10-CM

## 2021-11-10 MED ORDER — COLCHICINE-PROBENECID 0.5-500 MG PO TABS: 1.0000 | ORAL_TABLET | Freq: Every day | ORAL | 3 refills | Status: DC

## 2021-11-10 NOTE — Telephone Encounter (Signed)
OK to send refill for colchicine for patient.

## 2021-11-10 NOTE — Telephone Encounter (Signed)
Patient called she was seen at urgent care for Gout. She was given Colchiem and would like a refill because she is going out to town.  Please advise

## 2021-11-15 ENCOUNTER — Encounter (HOSPITAL_BASED_OUTPATIENT_CLINIC_OR_DEPARTMENT_OTHER): Payer: Medicare HMO | Admitting: Physical Therapy

## 2021-11-15 ENCOUNTER — Ambulatory Visit: Payer: Medicare HMO | Admitting: Obstetrics and Gynecology

## 2021-11-17 ENCOUNTER — Encounter (HOSPITAL_BASED_OUTPATIENT_CLINIC_OR_DEPARTMENT_OTHER): Payer: Medicare HMO | Admitting: Physical Therapy

## 2021-11-22 ENCOUNTER — Ambulatory Visit (HOSPITAL_BASED_OUTPATIENT_CLINIC_OR_DEPARTMENT_OTHER): Payer: Medicare HMO | Admitting: Physical Therapy

## 2021-11-24 ENCOUNTER — Encounter (HOSPITAL_BASED_OUTPATIENT_CLINIC_OR_DEPARTMENT_OTHER): Payer: Medicare HMO | Admitting: Physical Therapy

## 2021-11-29 ENCOUNTER — Encounter (HOSPITAL_BASED_OUTPATIENT_CLINIC_OR_DEPARTMENT_OTHER): Payer: Medicare HMO | Admitting: Physical Therapy

## 2021-12-01 ENCOUNTER — Encounter (HOSPITAL_BASED_OUTPATIENT_CLINIC_OR_DEPARTMENT_OTHER): Payer: Medicare HMO | Admitting: Physical Therapy

## 2021-12-13 ENCOUNTER — Telehealth: Payer: Self-pay | Admitting: Pulmonary Disease

## 2021-12-14 NOTE — Telephone Encounter (Signed)
Called and spoke to patient and changed her pharmacy in the computer as requested. Nothing further needed

## 2021-12-23 ENCOUNTER — Telehealth: Payer: Self-pay | Admitting: Pulmonary Disease

## 2021-12-23 MED ORDER — TRELEGY ELLIPTA 100-62.5-25 MCG/ACT IN AEPB: 1.0000 | INHALATION_SPRAY | Freq: Every day | RESPIRATORY_TRACT | 5 refills | Status: DC

## 2021-12-23 NOTE — Telephone Encounter (Signed)
Rx for pt's Trelegy inhaler has been sent to preferred pharmacy for pt.  Called and spoke with pt's son Wille Glaser letting him know this had been done and he verbalized understanding. Nothing further needed.

## 2021-12-26 ENCOUNTER — Telehealth (HOSPITAL_BASED_OUTPATIENT_CLINIC_OR_DEPARTMENT_OTHER): Payer: Self-pay | Admitting: Family Medicine

## 2021-12-26 ENCOUNTER — Telehealth: Payer: Self-pay | Admitting: Cardiology

## 2021-12-26 NOTE — Telephone Encounter (Signed)
hey, this pt just switched over to [place] as her pcp previous pt of [place], she needs refill on all of her prescriptions, [facility] [address] [place], [id number]. [facility] a [facility] but no refills since [facility] in [place]. she has one week left and her son says its urgent.

## 2021-12-26 NOTE — Telephone Encounter (Signed)
Patient stated she recently moved and all of her medications were lost.  Patient stated she will need prescriptions of [facility] medications [id number] [place]

## 2021-12-26 NOTE — Telephone Encounter (Signed)
Called pt, informed her to call her pharmacy to see what she needs to do, they will call if they need anything from [place]. Verbalized understanding. Upon review of chart pt was last seen 07/2020. Called pt and scheduled f/u with Dr Gardiner Rhyme 03-12-22.

## 2021-12-27 ENCOUNTER — Telehealth (HOSPITAL_BASED_OUTPATIENT_CLINIC_OR_DEPARTMENT_OTHER): Payer: Self-pay

## 2021-12-27 ENCOUNTER — Encounter (HOSPITAL_BASED_OUTPATIENT_CLINIC_OR_DEPARTMENT_OTHER): Payer: Self-pay | Admitting: Family Medicine

## 2021-12-27 DIAGNOSIS — E1165 Type 2 diabetes mellitus with hyperglycemia: Secondary | ICD-10-CM

## 2021-12-27 DIAGNOSIS — K219 Gastro-esophageal reflux disease without esophagitis: Secondary | ICD-10-CM

## 2021-12-27 DIAGNOSIS — E1169 Type 2 diabetes mellitus with other specified complication: Secondary | ICD-10-CM

## 2021-12-27 DIAGNOSIS — E1159 Type 2 diabetes mellitus with other circulatory complications: Secondary | ICD-10-CM

## 2021-12-27 DIAGNOSIS — M1A0790 Idiopathic chronic gout, unspecified ankle and foot, without tophus (tophi): Secondary | ICD-10-CM

## 2021-12-27 DIAGNOSIS — I2510 Atherosclerotic heart disease of native coronary artery without angina pectoris: Secondary | ICD-10-CM

## 2021-12-27 NOTE — Telephone Encounter (Signed)
Pt son called requesting refills on all her medications. He stated he would like to have them refilled today if possible they are leaving for out of town this week and he stated she is in need of the prescriptions.

## 2021-12-28 MED ORDER — CARVEDILOL 6.25 MG PO TABS: 6.2500 mg | ORAL_TABLET | Freq: Two times a day (BID) | ORAL | 1 refills | Status: DC

## 2021-12-28 MED ORDER — HYDRALAZINE HCL 25 MG PO TABS: 25.0000 mg | ORAL_TABLET | Freq: Two times a day (BID) | ORAL | 10 refills | Status: DC

## 2021-12-28 MED ORDER — AMLODIPINE-VALSARTAN-HCTZ 5-160-12.5 MG PO TABS: 2.0000 | ORAL_TABLET | Freq: Every day | ORAL | 1 refills | Status: DC

## 2021-12-28 MED ORDER — CLONIDINE HCL 0.2 MG PO TABS: ORAL_TABLET | ORAL | 3 refills | Status: DC

## 2021-12-28 MED ORDER — DAPAGLIFLOZIN PROPANEDIOL 10 MG PO TABS: 10.0000 mg | ORAL_TABLET | Freq: Every day | ORAL | 1 refills | Status: DC

## 2021-12-28 MED ORDER — ATORVASTATIN CALCIUM 10 MG PO TABS: 10.0000 mg | ORAL_TABLET | Freq: Every day | ORAL | 1 refills | Status: DC

## 2021-12-28 MED ORDER — COLCHICINE-PROBENECID 0.5-500 MG PO TABS: 1.0000 | ORAL_TABLET | Freq: Every day | ORAL | 1 refills | Status: DC

## 2021-12-28 MED ORDER — PANTOPRAZOLE SODIUM 40 MG PO TBEC: 40.0000 mg | DELAYED_RELEASE_TABLET | Freq: Every day | ORAL | 1 refills | Status: DC

## 2021-12-28 NOTE — Addendum Note (Signed)
Addended by: DE [place], Liesel Peckenpaugh J on: 12/28/2021 11:39 AM   Modules accepted: Orders

## 2022-01-10 ENCOUNTER — Encounter: Payer: Self-pay | Admitting: Obstetrics and Gynecology

## 2022-01-10 ENCOUNTER — Ambulatory Visit (INDEPENDENT_AMBULATORY_CARE_PROVIDER_SITE_OTHER): Payer: Medicare HMO | Admitting: Obstetrics and Gynecology

## 2022-01-10 DIAGNOSIS — N3281 Overactive bladder: Secondary | ICD-10-CM

## 2022-01-10 MED ORDER — MIRABEGRON ER 25 MG PO TB24: 25.0000 mg | ORAL_TABLET | Freq: Every day | ORAL | 11 refills | Status: DC

## 2022-01-10 NOTE — Patient Instructions (Addendum)
Continue with the vaginal estrogen tablet twice a week.

## 2022-01-10 NOTE — Progress Notes (Signed)
[facility] Return Visit  SUBJECTIVE  History of Present Illness: [name] [name] is a 81 y.o. female seen in follow-up for recurrent urinary tract infections.   Has been using the vaginal estrogen cream twice a week and that has been working well for her- she is using prefilled inserts. Did not use the estrogen tablets. No longer on the daily antibiotic (believes she finished this recently). Does not have any UTI symptoms.   Has Interstim device but does not have the controller with her today. Started Myrbetriq 59m daily. She is now able to hold her urine and not having any accidents. Can hold her urine for a few hours at a time and can sleep all night without waking.   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), Cataract, Closed fracture of upper end of humerus (11/03/2017), COPD (chronic obstructive pulmonary disease) ([facility], COVID-19 vaccine series started (07/01/2020), COVID-19 virus infection (03/09/2021), Depression, Encounter for Medicare annual wellness exam (07/01/2020), Encounter to establish care (06/03/2020), Essential hypertension (05/19/2019), Essential hypertension (05/19/2019), Female stress incontinence (02/14/2018), GERD (gastroesophageal reflux disease), History of opioid abuse ([facility] (05/19/2019), Hospital discharge follow-up (03/24/2021), Hyperglycemia (05/19/2019), Hyperlipidemia, Hypertension, Irritation of right eye (12/09/2020), Menopause present (02/11/2018), Need for shingles vaccine (07/01/2020), Osteoporosis, Prediabetes (05/22/2019), Sleep apnea, Subacute cough (03/24/2021), and 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   Past Surgical  History: She  has a past surgical history that includes Abdominal hysterectomy (1980); Melanoma excision (Right); Cholecystectomy (1980); Replacement total knee (Bilateral, 2000); and sacral neuromodulation.   Medications: She has a current medication list which includes the following prescription(s): albuterol, alprazolam, AMBULATORY NON FORMULARY MEDICATION, amlodipine-valsartan-hctz, arthritis pain relieving, atorvastatin, probiotic formula, blood glucose meter kit and supplies, sarna, carvedilol, cephalexin, cetirizine, clonidine, clonidine, colchicine-probenecid, dapagliflozin propanediol, diphenhydramine, easymax test, eliquis, estradiol, famotidine, ferosul, fluconazole, trelegy ellipta, guaifenesin, hydralazine, lactase, mirabegron er, mirtazapine, pantoprazole, and paroxetine.   Allergies: Patient is allergic to azithromycin, bee venom, erythromycin base, metronidazole, nitrofurantoin macrocrystal, penicillins, sulfa antibiotics, cymbalta [duloxetine hcl], ivp dye [iodinated contrast media], penicillin g, codeine, erythromycin, fosfomycin, levofloxacin, nitrofurantoin, and percocet [oxycodone-acetaminophen].   Social History: Patient  reports that she quit smoking about 35 years ago. Her smoking use included cigarettes. She started smoking about 66 years ago. She has never used smokeless tobacco. She reports that she does not currently use alcohol. She reports that she does not use drugs.      OBJECTIVE     Physical Exam: Vitals:   01/10/22 1520  BP: 130/66  Pulse: 77    Gen: No apparent distress, A&O x 3.  Detailed Urogynecologic Evaluation:  Deferred     ASSESSMENT AND PLAN    [name] [name] a 81y.o. with:  1. Overactive bladder     - Continue vaginal estrogen twice a week. She will [place] she is using so we can get her a refill when the time comes. She was last prescribed estrogen tablets and she is unsure what the formulation is.  - Continue  Myrbetriq 232mdaily.   Return 1 year or sooner if needed  MiJaquita FoldsMD  Time spent: I spent 25 minutes dedicated to the care of this patient on the date of this encounter to include pre-visit review of records, face-to-face time with  the patient and post visit documentation and ordering medication/ testing.

## 2022-01-13 ENCOUNTER — Ambulatory Visit: Payer: Medicare HMO | Admitting: Internal Medicine

## 2022-02-08 ENCOUNTER — Ambulatory Visit (INDEPENDENT_AMBULATORY_CARE_PROVIDER_SITE_OTHER): Payer: Medicare HMO | Admitting: Internal Medicine

## 2022-02-08 ENCOUNTER — Encounter: Payer: Self-pay | Admitting: Internal Medicine

## 2022-02-08 VITALS — BP 118/68 | HR 68 | Ht 65.0 in | Wt 259.0 lb

## 2022-02-08 DIAGNOSIS — R197 Diarrhea, unspecified: Secondary | ICD-10-CM | POA: Diagnosis not present

## 2022-02-08 MED ORDER — PSYLLIUM 58.6 % PO PACK: 1.0000 | PACK | Freq: Every day | ORAL | 12 refills | Status: DC

## 2022-02-08 NOTE — Progress Notes (Signed)
Chief Complaint: Diarrhea  HPI : 82 year old female with history of OSA, melanoma, anxiety, COPD, recent DVT on Eliquis, and osteoporosis presents for follow up of diarrhea  Interval History:  She has been having less diarrhea. She has been avoiding certain foods, which seems to help with her stool frequency. She has been using the Imodium twice a week, which is also helping. On average she has one BM per day. Once a while she will have ab pain when she feels a little backed up. She did not start any Metamucil.  Wt Readings from Last 3 Encounters:  02/08/22 259 lb (117.5 kg)  10/24/21 266 lb 3.2 oz (120.7 kg)  10/18/21 265 lb (120.2 kg)   Current Outpatient Medications  Medication Sig Dispense Refill   albuterol (VENTOLIN HFA) 108 (90 Base) MCG/ACT inhaler Inhale 1-2 puffs into the lungs every 4 (four) hours as needed for wheezing or shortness of breath. 18 g 1   ALPRAZolam (XANAX) 0.25 MG tablet TAKE (1) TABLET BY MOUTH TWICE DAILY AT 11AM & 5PM FOR ANXIETY. (Patient taking differently: Take 0.25 mg by mouth 2 (two) times daily. 1100 and 1500 And an extra 0.40m oral twice daily as needed for anxiety) 60 tablet 0   AMBULATORY NON FORMULARY MEDICATION One four wheel rolling walker with seat and hand brakes. Brand/style covered by insurance. Medically necessary for ICD-10: R26.81, Z74.1 1 Units 0   amLODIPine-Valsartan-HCTZ 5-160-12.5 MG TABS Take 2 tablets by mouth daily. 180 tablet 1   ARTHRITIS PAIN RELIEVING 0.075 % topical cream Apply 1 application topically 3 (three) times daily as needed (neuropathic pain in feet).     atorvastatin (LIPITOR) 10 MG tablet Take 1 tablet (10 mg total) by mouth daily. 90 tablet 1   Bacillus Coagulans-Inulin (PROBIOTIC FORMULA) 1-250 BILLION-MG CAPS TAKE 1 CAPSULE (250MG) BY MOUTH 2 TIMES A DAY. 60 capsule 11   blood glucose meter kit and supplies KIT Meter, test strips, lancets, and alcohol swabs. Use for testing blood sugar every morning before meals and  up to 3 additional times per day as needed. Brand based on patient and insurance coverage. 100 strips with 99 refills, 100 lancets with 99 refills, 200 alcohol swabs with 99 refills. Dx: E11.65 1 each 99   camphor-menthol (SARNA) lotion Apply 1 application topically as needed for itching. (Patient taking differently: Apply 1 application  topically 2 (two) times daily as needed for itching.) 222 mL 0   carvedilol (COREG) 6.25 MG tablet Take 1 tablet (6.25 mg total) by mouth 2 (two) times daily with a meal. 180 tablet 1   cephALEXin (KEFLEX) 250 MG capsule Take 1 capsule (250 mg total) by mouth daily. 30 capsule 5   cetirizine (ZYRTEC) 10 MG tablet TAKE 1 TABLET BY MOUTH ONCE A DAY. 30 tablet 10   cloNIDine (CATAPRES) 0.1 MG tablet Take 1 tablet (0.1 mg total) by mouth 3 (three) times daily as needed (systolic blood pressurre above 190). 15 tablet 0   cloNIDine (CATAPRES) 0.2 MG tablet TAKE (1) TABLET BY MOUTH ONCE EVERY HOUR AS NEEDED FOR SBP>190. **MAX 3 TABLETS (0.6MG) IN 24 HOURS** 30 tablet 3   colchicine-probenecid 0.5-500 MG tablet Take 1 tablet by mouth daily. 30 tablet 1   dapagliflozin propanediol (FARXIGA) 10 MG TABS tablet Take 1 tablet (10 mg total) by mouth daily before breakfast. 90 tablet 1   diphenhydrAMINE (BENADRYL) 25 mg capsule Take 1 capsule (25 mg total) by mouth every 8 (eight) hours as needed for itching  or allergies. 30 capsule 1   EASYMAX TEST test strip      ELIQUIS 5 MG TABS tablet TAKE (1) TABLET BY MOUTH TWICE DAILY. 60 tablet 10   Estradiol 10 MCG TABS vaginal tablet Place 1 tab nightly for twice a week 8 tablet 11   famotidine (PEPCID) 20 MG tablet Take 1 tablet (20 mg total) by mouth daily as needed for heartburn or indigestion. 90 tablet 3   FEROSUL 325 (65 Fe) MG tablet TAKE (1) TABLET BY MOUTH ONCE DAILY. 30 tablet 10   fluconazole (DIFLUCAN) 150 MG tablet Take 1 tablet (150 mg total) by mouth once as needed for up to 1 dose. 1 tablet 1    Fluticasone-Umeclidin-Vilant (TRELEGY ELLIPTA) 100-62.5-25 MCG/ACT AEPB Inhale 1 puff into the lungs daily. ** RINSE MOUTH AFTER USE** 60 each 5   guaiFENesin (MUCINEX) 600 MG 12 hr tablet Take 2 tablets (1,200 mg total) by mouth 2 (two) times daily as needed for to loosen phlegm or cough. Use for 7 days then as needed if cough is improving. 30 tablet 1   hydrALAZINE (APRESOLINE) 25 MG tablet Take 1 tablet (25 mg total) by mouth 2 (two) times daily. 60 tablet 10   Lactase 9000 units TABS Take one tablet with the first bite of meals containing milk or dairy products. (Patient taking differently: Take 9,000 Units by mouth See admin instructions. Take 9000units with the first bite of meals containing milk or dairy products.) 90 tablet 11   mirabegron ER (MYRBETRIQ) 25 MG TB24 tablet Take 1 tablet (25 mg total) by mouth daily. 30 tablet 11   mirtazapine (REMERON) 7.5 MG tablet Take 1 tablet (7.5 mg total) by mouth at bedtime. For depression and anxiety symptoms 30 tablet 2   pantoprazole (PROTONIX) 40 MG tablet Take 1 tablet (40 mg total) by mouth daily. 90 tablet 1   PARoxetine (PAXIL) 40 MG tablet Take 40 mg by mouth daily.     No current facility-administered medications for this visit.    Physical Exam: BP 118/68   Pulse 68   Ht _0  (1.651 m)   Wt 259 lb (117.5 kg)   SpO2 95%   BMI 43.10 kg/m  Constitutional: Pleasant,well-developed, female in no acute distress. HEENT: Normocephalic and atraumatic. Conjunctivae are normal. No scleral icterus. Cardiovascular: Normal rate, regular rhythm.  Pulmonary/chest: Effort normal and breath sounds normal. No wheezing, rales or rhonchi. Abdominal: Soft, nondistended, non-tender. Bowel sounds active throughout. There are no masses palpable. No hepatomegaly. Extremities: No edema Neurological: Alert and oriented to person place and time. Skin: Skin is warm and dry. No rashes noted. Psychiatric: Normal mood and affect. Behavior is normal.  Labs  06/2021: CBC and CMP unremarkable.   Labs 10/2021: CMP with mildly elevated alk phos  ASSESSMENT AND PLAN: Diarrhea Patient presents with diarrhea, which has improved with avoiding certain foods and taking Imodium PRN. She did not start taking a daily fiber supplement but states that she will do so. Overall she seems to be doing well. - Low FODMAP diet - Can trial daily Metamucil  - Cont Imodium 2 mg TID PRN - RTC 6 months  Christia Reading, MD  I spent 33 minutes of time, including in depth chart review, independent review of results as outlined above, communicating results with the patient directly, face-to-face time with the patient, coordinating care, and ordering studies and medications as appropriate, and documentation.

## 2022-02-08 NOTE — Patient Instructions (Signed)
_______________________________________________________  If you are age 82 or older, your body mass index should be between 23-30. Your Body mass index is 43.1 kg/m. If this is out of the aforementioned range listed, please consider follow up with your Primary Care Provider. _______________________________________________________  The [facility] providers would like to encourage you to use [facility] to communicate with providers for non-urgent requests or questions.  Due to long hold times on the telephone, sending your provider a message by [facility] may be a faster and more efficient way to get a response.  Please allow 48 business hours for a response.  Please remember that this is for non-urgent requests.  _______________________________________________________  Please purchase the following medications over the counter and take as directed:  START: Metamucil as directed on bottle.  Use daily.  You will need a follow up appointment in 6 months (July 2024).  We will contact you to schedule this appointment.  Thank you for entrusting me with your care and choosing [facility]  Dr Lorenso Courier

## 2022-02-23 ENCOUNTER — Ambulatory Visit (HOSPITAL_BASED_OUTPATIENT_CLINIC_OR_DEPARTMENT_OTHER)
Admission: RE | Admit: 2022-02-23 | Discharge: 2022-02-23 | Disposition: A | Discharge status: Home / Self Care | Payer: Medicare HMO | Source: Ambulatory Visit | Attending: Pulmonary Disease | Admitting: Pulmonary Disease

## 2022-02-23 ENCOUNTER — Encounter (HOSPITAL_BASED_OUTPATIENT_CLINIC_OR_DEPARTMENT_OTHER): Payer: Self-pay

## 2022-02-23 DIAGNOSIS — R911 Solitary pulmonary nodule: Secondary | ICD-10-CM | POA: Insufficient documentation

## 2022-03-01 ENCOUNTER — Ambulatory Visit (INDEPENDENT_AMBULATORY_CARE_PROVIDER_SITE_OTHER): Payer: Medicare HMO | Admitting: Pulmonary Disease

## 2022-03-01 ENCOUNTER — Encounter: Payer: Self-pay | Admitting: Pulmonary Disease

## 2022-03-01 VITALS — BP 100/60 | HR 71 | Ht 63.0 in | Wt 261.6 lb

## 2022-03-01 DIAGNOSIS — R918 Other nonspecific abnormal finding of lung field: Secondary | ICD-10-CM | POA: Diagnosis not present

## 2022-03-01 DIAGNOSIS — E1159 Type 2 diabetes mellitus with other circulatory complications: Secondary | ICD-10-CM | POA: Diagnosis not present

## 2022-03-01 DIAGNOSIS — J449 Chronic obstructive pulmonary disease, unspecified: Secondary | ICD-10-CM | POA: Diagnosis not present

## 2022-03-01 DIAGNOSIS — I152 Hypertension secondary to endocrine disorders: Secondary | ICD-10-CM

## 2022-03-01 DIAGNOSIS — G4733 Obstructive sleep apnea (adult) (pediatric): Secondary | ICD-10-CM

## 2022-03-01 DIAGNOSIS — R911 Solitary pulmonary nodule: Secondary | ICD-10-CM

## 2022-03-01 NOTE — Progress Notes (Signed)
Synopsis: Referred in July 2022 for lung nodule, by Dr.Olalere, PCP: By de [name], [name] J, MD  Subjective:   PATIENT ID: [name] [name] GENDER: female DOB: 19-Dec-1940, MRN: [id number]  Chief Complaint  Patient presents with   Follow-up    This is a 82 year old female, patient of Dr. Ander Slade, referred for right lower lobe pulmonary nodule.  She is a former smoker quit in 1988.  She had a CT scan of the chest completed in November 2020 that is visible in PACS.  This showed a 4 mm right lower lobe pulmonary nodule.  She had CT scan follow-up that was completed.  CT follow-up imaging was completed on 07/22/2020.  This CT scan of the chest revealed a 7 mm right lower lobe pulmonary nodule that was previously 4 mm and had increased in size and now has slight spiculated margins.  She had this followed by a nuclear medicine pet image on 08/19/2020.  Nuclear medicine pet imaging shows no significant hypermetabolism however the lesion itself is smaller than the lower limit for size related to PET scans.  From a respiratory standpoint she does feel short of breath with exertion.  Her mobility is aided by a rolling walker.  She is currently using as needed albuterol.  Has been told she may have COPD in the past.  She does have PFTs scheduled for today which have not been completed yet.  OV 12/20/2020: Here today for follow-up regarding COPD, shortness of breath.  She does have OSA follows with Dr. Jenetta Downer.  She enjoys using her Trelegy.  However this is costly.  She is unable to afford it.  She would like to stay on it if possible.  We will fill out the financial aid application to help her.  She also has a planned trip to [place].  She has experienced high altitude sickness before.  Associated with headache and vomiting.  She is worried about this happening again.  We talked about prophylactic measures today.  OV 10/24/2021:Patient here today for follow-up regarding CT imaging.  Patient had CT imaging on 08/23/2021  patient had a solid right lower lobe pulmonary nodule not significantly changed in comparison.  He has a subpleural solid nodule in the right upper lobe that has enlarged and become increasingly solid now measuring 5 mm in diameter.  It was more of a groundglass appearing lesion 6 months prior.  Now it has become more solid.  It does give rise for concern of potential underlying malignancy or indolent neoplasm.  OV 03/01/2022: This patient is here today for follow-up of pulmonary nodules.  We have been watching some small pulmonary nodules over time.  Recent CT scan of the chest shows decrease in size of one of the lesions however an increase in size of another from 5 mm to 7 mm.  From respiratory standpoint she is doing okay has no complaints.  She needs to follow-up with Dr. Jenetta Downer for her sleep management.  And obstructive sleep apnea.    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   Cataract    Closed fracture of upper end of  humerus 11/03/2017   COPD (chronic obstructive pulmonary disease) ([place])    COVID-19 vaccine series started 07/01/2020   COVID-19 virus infection 03/09/2021   Depression    Diabetes 1.5, managed as type 2 ([place])    Encounter for Medicare annual wellness exam 07/01/2020   Encounter to establish care 06/03/2020   Essential hypertension 05/19/2019   Essential hypertension 05/19/2019   Female stress incontinence 02/14/2018   GERD (gastroesophageal reflux disease)    History of opioid abuse ([place]) 05/19/2019   Hospital discharge follow-up 03/24/2021   Hyperglycemia 05/19/2019   Hyperlipidemia    Hypertension     Irritation of right eye 12/09/2020   Menopause present 02/11/2018   Need for shingles vaccine 07/01/2020   Osteoporosis    Prediabetes 05/22/2019   Sleep apnea    Subacute cough 03/24/2021   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     Family History  Problem Relation Age of Onset   Cancer Mother    Intellectual disability Mother    Cancer Father    Early death Father    Hypertension Father    Cancer Sister    Arthritis Maternal Grandmother    Asthma Paternal Grandmother    Asthma Paternal Grandfather    Colon cancer Neg Hx    Stomach cancer Neg Hx    Esophageal cancer Neg Hx    Colon polyps Neg Hx      Past Surgical History:  Procedure Laterality Date   ABDOMINAL HYSTERECTOMY  1980   CHOLECYSTECTOMY  1980   MELANOMA EXCISION Right    Great Toe   REPLACEMENT TOTAL KNEE Bilateral 2000   sacral neuromodulation      Social History   Socioeconomic History   Marital status: Widowed    Spouse name: Not on file   Number of children: 3   Years of education: Not on file   Highest education level: Not on file  Occupational History   Occupation: Retired  Tobacco Use   Smoking status: Former    Years: 20.00    Types: Cigarettes    Start date: 1957    Quit date: 1988    Years since quitting: 36.0   Smokeless tobacco: Never  Vaping Use   Vaping Use: Never used  Substance and Sexual Activity   Alcohol use: Not Currently   Drug use: Never   Sexual activity: Not Currently  Other Topics Concern   Not on file  Social History Narrative   Not on file   Social Determinants of Health   Financial Resource Strain: [place]  (07/01/2020)   Overall Financial Resource Strain (CARDIA)    Difficulty of Paying Living Expenses: Not hard at all  Food Insecurity: No [place] (07/01/2020)   Hunger Vital Sign    Worried About Running Out of Food in the Last Year: Never true    [place] in the Last Year: Never true  Transportation Needs: No Transportation Needs  (07/01/2020)   PRAPARE - Hydrologist (Medical): No    Lack of Transportation (Non-Medical): No  Physical Activity: Sufficiently Active (02/02/2020)   Exercise Vital Sign    Days of Exercise per Week: 3 days    Minutes of Exercise per Session: 60 min  Stress: No Stress Concern Present (07/01/2020)   [place]    Feeling of Stress : Not at all  Social Connections: Moderately Isolated (07/01/2020)   Social Connection and Isolation Panel [  NHANES]    Frequency of Communication with Friends and Family: More than three times a week    Frequency of Social Gatherings with Friends and Family: More than three times a week    Attends Religious Services: Never    Marine scientist or Organizations: Yes    Attends Music therapist: More than 4 times per year    Marital Status: Widowed  Intimate Partner Violence: Not At Risk (07/01/2020)   Humiliation, Afraid, Rape, and Kick questionnaire    Fear of Current or Ex-Partner: No    Emotionally Abused: No    Physically Abused: No    Sexually Abused: No     Allergies  Allergen Reactions   Azithromycin Anaphylaxis and Hives   Bee Venom Anaphylaxis   Erythromycin Base Anaphylaxis   Metronidazole Anaphylaxis   Nitrofurantoin Macrocrystal Anaphylaxis   Penicillins Anaphylaxis and Hives    childhood   Sulfa Antibiotics Anaphylaxis and Hives    Childhood   Cymbalta [Duloxetine Hcl]    Ivp Dye [Iodinated Contrast Media]    Penicillin G Hives   Codeine Rash   Erythromycin Rash and Hives   Fosfomycin Rash   Levofloxacin Rash   Nitrofurantoin Hives   Percocet [Oxycodone-Acetaminophen] Rash     Outpatient Medications Prior to Visit  Medication Sig Dispense Refill   albuterol (VENTOLIN HFA) 108 (90 Base) MCG/ACT inhaler Inhale 1-2 puffs into the lungs every 4 (four) hours as needed for wheezing or shortness of breath. 18 g 1   cetirizine  (ZYRTEC) 10 MG tablet TAKE 1 TABLET BY MOUTH ONCE A DAY. 30 tablet 10   Fluticasone-Umeclidin-Vilant (TRELEGY ELLIPTA) 100-62.5-25 MCG/ACT AEPB Inhale 1 puff into the lungs daily. ** RINSE MOUTH AFTER USE** 60 each 5   guaiFENesin (MUCINEX) 600 MG 12 hr tablet Take 2 tablets (1,200 mg total) by mouth 2 (two) times daily as needed for to loosen phlegm or cough. Use for 7 days then as needed if cough is improving. 30 tablet 1   ALPRAZolam (XANAX) 0.25 MG tablet TAKE (1) TABLET BY MOUTH TWICE DAILY AT 11AM & 5PM FOR ANXIETY. (Patient taking differently: Take 0.25 mg by mouth 2 (two) times daily. 1100 and 1500 And an extra 0.'25mg'$  oral twice daily as needed for anxiety) 60 tablet 0   AMBULATORY NON FORMULARY MEDICATION One four wheel rolling walker with seat and hand brakes. Brand/style covered by insurance. Medically necessary for ICD-10: R26.81, Z74.1 1 Units 0   amLODIPine-Valsartan-HCTZ 5-160-12.5 MG TABS Take 2 tablets by mouth daily. 180 tablet 1   ARTHRITIS PAIN RELIEVING 0.075 % topical cream Apply 1 application topically 3 (three) times daily as needed (neuropathic pain in feet).     atorvastatin (LIPITOR) 10 MG tablet Take 1 tablet (10 mg total) by mouth daily. 90 tablet 1   Bacillus Coagulans-Inulin (PROBIOTIC FORMULA) 1-250 BILLION-MG CAPS TAKE 1 CAPSULE ('250MG'$ ) BY MOUTH 2 TIMES A DAY. 60 capsule 11   blood glucose meter kit and supplies KIT Meter, test strips, lancets, and alcohol swabs. Use for testing blood sugar every morning before meals and up to 3 additional times per day as needed. Brand based on patient and insurance coverage. 100 strips with 99 refills, 100 lancets with 99 refills, 200 alcohol swabs with 99 refills. Dx: E11.65 1 each 99   camphor-menthol (SARNA) lotion Apply 1 application topically as needed for itching. (Patient taking differently: Apply 1 application  topically 2 (two) times daily as needed for itching.) 222 mL 0  carvedilol (COREG) 6.25 MG tablet Take 1 tablet (6.25  mg total) by mouth 2 (two) times daily with a meal. 180 tablet 1   cephALEXin (KEFLEX) 250 MG capsule Take 1 capsule (250 mg total) by mouth daily. 30 capsule 5   cloNIDine (CATAPRES) 0.1 MG tablet Take 1 tablet (0.1 mg total) by mouth 3 (three) times daily as needed (systolic blood pressurre above 190). 15 tablet 0   cloNIDine (CATAPRES) 0.2 MG tablet TAKE (1) TABLET BY MOUTH ONCE EVERY HOUR AS NEEDED FOR SBP>190. **MAX 3 TABLETS (0.'6MG'$ ) IN 24 HOURS** 30 tablet 3   colchicine-probenecid 0.5-500 MG tablet Take 1 tablet by mouth daily. 30 tablet 1   dapagliflozin propanediol (FARXIGA) 10 MG TABS tablet Take 1 tablet (10 mg total) by mouth daily before breakfast. 90 tablet 1   diphenhydrAMINE (BENADRYL) 25 mg capsule Take 1 capsule (25 mg total) by mouth every 8 (eight) hours as needed for itching or allergies. 30 capsule 1   EASYMAX TEST test strip      ELIQUIS 5 MG TABS tablet TAKE (1) TABLET BY MOUTH TWICE DAILY. 60 tablet 10   Estradiol 10 MCG TABS vaginal tablet Place 1 tab nightly for twice a week 8 tablet 11   famotidine (PEPCID) 20 MG tablet Take 1 tablet (20 mg total) by mouth daily as needed for heartburn or indigestion. 90 tablet 3   FEROSUL 325 (65 Fe) MG tablet TAKE (1) TABLET BY MOUTH ONCE DAILY. 30 tablet 10   fluconazole (DIFLUCAN) 150 MG tablet Take 1 tablet (150 mg total) by mouth once as needed for up to 1 dose. 1 tablet 1   hydrALAZINE (APRESOLINE) 25 MG tablet Take 1 tablet (25 mg total) by mouth 2 (two) times daily. 60 tablet 10   Lactase 9000 units TABS Take one tablet with the first bite of meals containing milk or dairy products. (Patient taking differently: Take 9,000 Units by mouth See admin instructions. Take 9000units with the first bite of meals containing milk or dairy products.) 90 tablet 11   mirabegron ER (MYRBETRIQ) 25 MG TB24 tablet Take 1 tablet (25 mg total) by mouth daily. 30 tablet 11   mirtazapine (REMERON) 7.5 MG tablet Take 1 tablet (7.5 mg total) by mouth at  bedtime. For depression and anxiety symptoms 30 tablet 2   pantoprazole (PROTONIX) 40 MG tablet Take 1 tablet (40 mg total) by mouth daily. 90 tablet 1   PARoxetine (PAXIL) 40 MG tablet Take 40 mg by mouth daily.     psyllium (METAMUCIL) 58.6 % packet Take 1 packet by mouth daily. 30 each 12   No facility-administered medications prior to visit.    Review of Systems  Constitutional:  Negative for chills, fever, malaise/fatigue and weight loss.  HENT:  Negative for hearing loss, sore throat and tinnitus.   Eyes:  Negative for blurred vision and double vision.  Respiratory:  Positive for shortness of breath. Negative for cough, hemoptysis, sputum production, wheezing and stridor.   Cardiovascular:  Negative for chest pain, palpitations, orthopnea, leg swelling and PND.  Gastrointestinal:  Negative for abdominal pain, constipation, diarrhea, heartburn, nausea and vomiting.  Genitourinary:  Negative for dysuria, hematuria and urgency.  Musculoskeletal:  Negative for joint pain and myalgias.  Skin:  Negative for itching and rash.  Neurological:  Negative for dizziness, tingling, weakness and headaches.  Endo/Heme/Allergies:  Negative for environmental allergies. Does not bruise/bleed easily.  Psychiatric/Behavioral:  Negative for depression. The patient is not nervous/anxious and does not have  insomnia.   All other systems reviewed and are negative.    Objective:  Physical Exam Vitals reviewed.  Constitutional:      General: She is not in acute distress.    Appearance: She is well-developed. She is obese.  HENT:     Head: Normocephalic and atraumatic.  Eyes:     General: No scleral icterus.    Conjunctiva/sclera: Conjunctivae normal.     Pupils: Pupils are equal, round, and reactive to light.  Neck:     Vascular: No JVD.     Trachea: No tracheal deviation.  Cardiovascular:     Rate and Rhythm: Normal rate and regular rhythm.     Heart sounds: Normal heart sounds. No murmur  heard. Pulmonary:     Effort: Pulmonary effort is normal. No tachypnea, accessory muscle usage or respiratory distress.     Breath sounds: No stridor. No wheezing, rhonchi or rales.  Abdominal:     General: There is no distension.     Palpations: Abdomen is soft.     Tenderness: There is no abdominal tenderness.  Musculoskeletal:        General: No tenderness.     Cervical back: Neck supple.  Lymphadenopathy:     Cervical: No cervical adenopathy.  Skin:    General: Skin is warm and dry.     Capillary Refill: Capillary refill takes less than 2 seconds.     Findings: No rash.  Neurological:     Mental Status: She is alert and oriented to person, place, and time.  Psychiatric:        Behavior: Behavior normal.      Vitals:   03/01/22 1426  BP: 100/60  Pulse: 71  SpO2: 95%  Weight: 261 lb 9.6 oz (118.7 kg)  Height: '5\' 3"'$  (1.6 m)   95% on RA  BMI Readings from Last 3 Encounters:  03/01/22 46.34 kg/m  02/08/22 43.10 kg/m  10/24/21 44.30 kg/m   Wt Readings from Last 3 Encounters:  03/01/22 261 lb 9.6 oz (118.7 kg)  02/08/22 259 lb (117.5 kg)  10/24/21 266 lb 3.2 oz (120.7 kg)     CBC    Component Value Date/Time   WBC 8.7 07/05/2021 1021   WBC 11.8 (H) 03/13/2021 0221   RBC 5.01 07/05/2021 1021   RBC 4.12 03/13/2021 0221   HGB 12.8 07/05/2021 1021   HCT 41.0 07/05/2021 1021   PLT 275 07/05/2021 1021   MCV 82 07/05/2021 1021   MCH 25.5 (L) 07/05/2021 1021   MCH 19.9 (L) 03/13/2021 0221   MCHC 31.2 (L) 07/05/2021 1021   MCHC 28.8 (L) 03/13/2021 0221   RDW 16.3 (H) 07/05/2021 1021   LYMPHSABS 1.3 07/05/2021 1021   MONOABS 0.6 03/13/2021 0221   EOSABS 0.2 07/05/2021 1021   BASOSABS 0.0 07/05/2021 1021      Chest Imaging: June 2022 CT chest: Small 7 mm right lower lobe pulmonary nodule adjacent to the pleura.  Slowly enlarging.  Last CT scan was in November 2020.  It has gotten bigger since 2020. The patient's images have been independently reviewed by  me.    July 2023: CT chest: Right upper lobe pulmonary nodule is slightly more solid in nature in comparison to previous. Right lower lobe pulmonary nodule stable. The patient's images have been independently reviewed by me.    January 2024 CT chest: 2 dominant nodules that were following was decreased in size 1 is increased in size because nodule at 7 mm. The patient's  images have been independently reviewed by me.    Pulmonary Functions Testing Results:    Latest Ref Rng & Units 09/02/2020   11:23 AM  PFT Results  FVC-Pre L 1.73   FVC-Predicted Pre % 63   FVC-Post L 1.81   FVC-Predicted Post % 66   Pre FEV1/FVC % % 80   Post FEV1/FCV % % 84   FEV1-Pre L 1.39   FEV1-Predicted Pre % 68   FEV1-Post L 1.52   DLCO uncorrected ml/min/mmHg 14.28   DLCO UNC% % 74   DLCO corrected ml/min/mmHg 14.28   DLCO COR %Predicted % 74   DLVA Predicted % 97   TLC L 5.24   TLC % Predicted % 102   RV % Predicted % 131     FeNO:   Pathology:   Echocardiogram:   Heart Catheterization:     Assessment & Plan:     ICD-10-CM   1. Lung nodules  R91.8 CT Super D Chest Wo Contrast    2. Hypertension associated with diabetes ([place])  E11.59 Ambulatory referral to [facility]   I15.2     3. Nodule of upper lobe of right lung  R91.1     4. Right lower lobe pulmonary nodule  R91.1     5. Chronic obstructive pulmonary disease, unspecified COPD type ([place])  J44.9     6. Obstructive sleep apnea syndrome  G47.33       Discussion:  This is a 82 year old female, right lower lobe pulmonary nodule somewhat slowly enlarging over time.  She has a right upper lobe groundglass nodule.The right lower lobe nodule has increased from 5 mm to 7 mm.  The right upper lobe nodule has dissipated in size.  She currently has COPD which is managed with Trelegy.  She is doing really well and breathing well since starting that.  Plan: Continue Trelegy Continue albuterol as needed Repeat noncontrasted CT  chest in January 2025. Follow-up with [place] after her CT chest. Continue CPAP management by Dr. Jenetta Downer.  In sleep clinic.    Current Outpatient Medications:    albuterol (VENTOLIN HFA) 108 (90 Base) MCG/ACT inhaler, Inhale 1-2 puffs into the lungs every 4 (four) hours as needed for wheezing or shortness of breath., Disp: 18 g, Rfl: 1   cetirizine (ZYRTEC) 10 MG tablet, TAKE 1 TABLET BY MOUTH ONCE A DAY., Disp: 30 tablet, Rfl: 10   Fluticasone-Umeclidin-Vilant (TRELEGY ELLIPTA) 100-62.5-25 MCG/ACT AEPB, Inhale 1 puff into the lungs daily. ** RINSE MOUTH AFTER USE**, Disp: 60 each, Rfl: 5   guaiFENesin (MUCINEX) 600 MG 12 hr tablet, Take 2 tablets (1,200 mg total) by mouth 2 (two) times daily as needed for to loosen phlegm or cough. Use for 7 days then as needed if cough is improving., Disp: 30 tablet, Rfl: 1   ALPRAZolam (XANAX) 0.25 MG tablet, TAKE (1) TABLET BY MOUTH TWICE DAILY AT 11AM & 5PM FOR ANXIETY. (Patient taking differently: Take 0.25 mg by mouth 2 (two) times daily. 1100 and 1500 And an extra 0.'25mg'$  oral twice daily as needed for anxiety), Disp: 60 tablet, Rfl: 0   AMBULATORY NON FORMULARY MEDICATION, One four wheel rolling walker with seat and hand brakes. Brand/style covered by insurance. Medically necessary for ICD-10: R26.81, Z74.1, Disp: 1 Units, Rfl: 0   amLODIPine-Valsartan-HCTZ 5-160-12.5 MG TABS, Take 2 tablets by mouth daily., Disp: 180 tablet, Rfl: 1   ARTHRITIS PAIN RELIEVING 0.075 % topical cream, Apply 1 application topically 3 (three) times daily as needed (neuropathic  pain in feet)., Disp: , Rfl:    atorvastatin (LIPITOR) 10 MG tablet, Take 1 tablet (10 mg total) by mouth daily., Disp: 90 tablet, Rfl: 1   Bacillus Coagulans-Inulin (PROBIOTIC FORMULA) 1-250 BILLION-MG CAPS, TAKE 1 CAPSULE ('250MG'$ ) BY MOUTH 2 TIMES A DAY., Disp: 60 capsule, Rfl: 11   blood glucose meter kit and supplies KIT, Meter, test strips, lancets, and alcohol swabs. Use for testing blood sugar every morning  before meals and up to 3 additional times per day as needed. Brand based on patient and insurance coverage. 100 strips with 99 refills, 100 lancets with 99 refills, 200 alcohol swabs with 99 refills. Dx: E11.65, Disp: 1 each, Rfl: 99   camphor-menthol (SARNA) lotion, Apply 1 application topically as needed for itching. (Patient taking differently: Apply 1 application  topically 2 (two) times daily as needed for itching.), Disp: 222 mL, Rfl: 0   carvedilol (COREG) 6.25 MG tablet, Take 1 tablet (6.25 mg total) by mouth 2 (two) times daily with a meal., Disp: 180 tablet, Rfl: 1   cephALEXin (KEFLEX) 250 MG capsule, Take 1 capsule (250 mg total) by mouth daily., Disp: 30 capsule, Rfl: 5   cloNIDine (CATAPRES) 0.1 MG tablet, Take 1 tablet (0.1 mg total) by mouth 3 (three) times daily as needed (systolic blood pressurre above 190)., Disp: 15 tablet, Rfl: 0   cloNIDine (CATAPRES) 0.2 MG tablet, TAKE (1) TABLET BY MOUTH ONCE EVERY HOUR AS NEEDED FOR SBP>190. **MAX 3 TABLETS (0.'6MG'$ ) IN 24 HOURS**, Disp: 30 tablet, Rfl: 3   colchicine-probenecid 0.5-500 MG tablet, Take 1 tablet by mouth daily., Disp: 30 tablet, Rfl: 1   dapagliflozin propanediol (FARXIGA) 10 MG TABS tablet, Take 1 tablet (10 mg total) by mouth daily before breakfast., Disp: 90 tablet, Rfl: 1   diphenhydrAMINE (BENADRYL) 25 mg capsule, Take 1 capsule (25 mg total) by mouth every 8 (eight) hours as needed for itching or allergies., Disp: 30 capsule, Rfl: 1   EASYMAX TEST test strip, , Disp: , Rfl:    ELIQUIS 5 MG TABS tablet, TAKE (1) TABLET BY MOUTH TWICE DAILY., Disp: 60 tablet, Rfl: 10   Estradiol 10 MCG TABS vaginal tablet, Place 1 tab nightly for twice a week, Disp: 8 tablet, Rfl: 11   famotidine (PEPCID) 20 MG tablet, Take 1 tablet (20 mg total) by mouth daily as needed for heartburn or indigestion., Disp: 90 tablet, Rfl: 3   FEROSUL 325 (65 Fe) MG tablet, TAKE (1) TABLET BY MOUTH ONCE DAILY., Disp: 30 tablet, Rfl: 10   fluconazole  (DIFLUCAN) 150 MG tablet, Take 1 tablet (150 mg total) by mouth once as needed for up to 1 dose., Disp: 1 tablet, Rfl: 1   hydrALAZINE (APRESOLINE) 25 MG tablet, Take 1 tablet (25 mg total) by mouth 2 (two) times daily., Disp: 60 tablet, Rfl: 10   Lactase 9000 units TABS, Take one tablet with the first bite of meals containing milk or dairy products. (Patient taking differently: Take 9,000 Units by mouth See admin instructions. Take 9000units with the first bite of meals containing milk or dairy products.), Disp: 90 tablet, Rfl: 11   mirabegron ER (MYRBETRIQ) 25 MG TB24 tablet, Take 1 tablet (25 mg total) by mouth daily., Disp: 30 tablet, Rfl: 11   mirtazapine (REMERON) 7.5 MG tablet, Take 1 tablet (7.5 mg total) by mouth at bedtime. For depression and anxiety symptoms, Disp: 30 tablet, Rfl: 2   pantoprazole (PROTONIX) 40 MG tablet, Take 1 tablet (40 mg total) by mouth daily.,  Disp: 90 tablet, Rfl: 1   PARoxetine (PAXIL) 40 MG tablet, Take 40 mg by mouth daily., Disp: , Rfl:    psyllium (METAMUCIL) 58.6 % packet, Take 1 packet by mouth daily., Disp: 30 each, Rfl: [place], DO [facility] 03/01/2022 2:49 PM

## 2022-03-01 NOTE — Patient Instructions (Addendum)
Thank you for visiting Dr. Valeta Harms at [facility]. Today we recommend the following:  Orders Placed This Encounter  Procedures   CT Super D Chest Wo Contrast   Ambulatory referral to Family Practice   Please see [place] after CT chest in Jan. 2025  Return in about 1 year (around 03/02/2023) for with Eric Form, NP, or Dr. Valeta Harms. Needs an appt to see sleep doc for CPAP management     Please do your part to reduce the spread of COVID-19.

## 2022-03-20 ENCOUNTER — Other Ambulatory Visit (HOSPITAL_BASED_OUTPATIENT_CLINIC_OR_DEPARTMENT_OTHER): Payer: Self-pay | Admitting: Family Medicine

## 2022-03-20 DIAGNOSIS — M1A0790 Idiopathic chronic gout, unspecified ankle and foot, without tophus (tophi): Secondary | ICD-10-CM

## 2022-03-21 ENCOUNTER — Other Ambulatory Visit (HOSPITAL_BASED_OUTPATIENT_CLINIC_OR_DEPARTMENT_OTHER): Payer: Self-pay

## 2022-03-21 DIAGNOSIS — M1A0790 Idiopathic chronic gout, unspecified ankle and foot, without tophus (tophi): Secondary | ICD-10-CM

## 2022-03-21 MED ORDER — COLCHICINE-PROBENECID 0.5-500 MG PO TABS: 1.0000 | ORAL_TABLET | Freq: Every day | ORAL | 1 refills | Status: DC

## 2022-03-22 ENCOUNTER — Encounter: Payer: Self-pay | Admitting: Cardiology

## 2022-03-22 ENCOUNTER — Ambulatory Visit: Payer: Medicare HMO | Attending: Cardiology | Admitting: Cardiology

## 2022-03-22 VITALS — BP 142/68 | HR 78 | Ht 63.0 in | Wt 260.0 lb

## 2022-03-22 DIAGNOSIS — R55 Syncope and collapse: Secondary | ICD-10-CM | POA: Diagnosis not present

## 2022-03-22 DIAGNOSIS — E785 Hyperlipidemia, unspecified: Secondary | ICD-10-CM

## 2022-03-22 DIAGNOSIS — R0989 Other specified symptoms and signs involving the circulatory and respiratory systems: Secondary | ICD-10-CM

## 2022-03-22 DIAGNOSIS — I10 Essential (primary) hypertension: Secondary | ICD-10-CM | POA: Diagnosis not present

## 2022-03-22 DIAGNOSIS — R011 Cardiac murmur, unspecified: Secondary | ICD-10-CM | POA: Diagnosis not present

## 2022-03-22 DIAGNOSIS — I739 Peripheral vascular disease, unspecified: Secondary | ICD-10-CM

## 2022-03-22 NOTE — Patient Instructions (Signed)
Medication Instructions:  Your physician recommends that you continue on your current medications as directed. Please refer to the Current Medication list given to you today.  *If you need a refill on your cardiac medications before your next appointment, please call your pharmacy*  Testing/Procedures: Your physician has requested that you have an echocardiogram. Echocardiography is a painless test that uses sound waves to create images of your heart. It provides your doctor with information about the size and shape of your heart and how well your heart's chambers and valves are working. This procedure takes approximately one hour. There are no restrictions for this procedure. Please do NOT wear cologne, perfume, aftershave, or lotions (deodorant is allowed). Please arrive 15 minutes prior to your appointment time.  Your physician has requested that you have a carotid duplex. This test is an ultrasound of the carotid arteries in your neck. It looks at blood flow through these arteries that supply the brain with blood. Allow one hour for this exam. There are no restrictions or special instructions.  Follow-Up: At [facility], you and your health needs are our priority.  As part of our continuing mission to provide you with exceptional heart care, we have created designated Provider Care Teams.  These Care Teams include your primary Cardiologist (physician) and Advanced Practice Providers (APPs -  Physician Assistants and Nurse Practitioners) who all work together to provide you with the care you need, when you need it.  We recommend signing up for the patient portal called "MyChart".  Sign up information is provided on this After Visit Summary.  MyChart is used to connect with patients for Virtual Visits (Telemedicine).  Patients are able to view lab/test results, encounter notes, upcoming appointments, etc.  Non-urgent messages can be sent to your provider as well.   To learn more about  what you can do with MyChart, go to [email or web address].    Your next appointment:   12 month(s)  Provider:   Dr. Gardiner Rhyme  Other Instructions PCP at [facility]: [phone]

## 2022-03-22 NOTE — Progress Notes (Signed)
Cardiology Office Note:    Date:  03/22/2022   ID:  [name] [name], DOB 05/07/1940, MRN [id number]  PCP:  de [place], [name], MD  Cardiologist:  None  Electrophysiologist:  None   Referring MD: Orma Render, NP   No chief complaint on file.   History of Present Illness:    [name] [name] is a 82 y.o. female with a hx of melanoma, COPD, hypertension, hyperlipidemia, OSA who presents for follow-up.  She was referred by Raiford Noble, PA for evaluation of hypertension, initially seen on 12/30/2019.  She recently moved to [place] from [place].  Previously followed with Dr. Nolene Bernheim in [place], [place].  Reports that had episode of syncope in 2019 and had loop recorder inserted.  Reports has not been interrogated in a while.  She denies any chest pain but does state that she gets short of breath with exertion.  She has been doing physical therapy.  She denies any lightheadedness.  Does report she has intermittent lower extremity edema.  States that she has been having pain in her legs.  Reports compliance with her CPAP.  Former smoker, quit in 1988.  No history of heart disease in her immediate family.  Echocardiogram 03/18/2020 showed normal biventricular function, no significant valvular disease.  Lexiscan Myoview on 05/26/2020 showed normal perfusion, EF 61%.  Since last clinic visit, she reports she is doing well.  Denies any chest pain, dyspnea, lightheadedness, syncope,  or palpitations.  She has been wearing TED hose, which has kept LE edema under good control.  She is not exercising but is about to start physical therapy.  Reports compliance with CPAP.  No pain in legs with walking.   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   Cataract    Closed fracture of upper end of humerus 11/03/2017   COPD (chronic obstructive pulmonary disease) ([place])    COVID-19 vaccine series started 07/01/2020   COVID-19 virus infection 03/09/2021   Depression    Diabetes 1.5, managed as type 2 ([place])    Encounter for Medicare annual wellness exam 07/01/2020   Encounter to establish care 06/03/2020   Essential hypertension 05/19/2019   Essential hypertension 05/19/2019   Female stress incontinence 02/14/2018   GERD (gastroesophageal reflux disease)    History of opioid abuse ([place]) 05/19/2019   Hospital discharge follow-up 03/24/2021   Hyperglycemia 05/19/2019   Hyperlipidemia    Hypertension    Irritation of right eye 12/09/2020   Menopause present 02/11/2018   Need for shingles vaccine 07/01/2020   Osteoporosis    Prediabetes 05/22/2019   Sleep apnea    Subacute cough 03/24/2021   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     Allergies:   Azithromycin,  Bee venom, Erythromycin base, Metronidazole, Nitrofurantoin macrocrystal, Penicillins, Sulfa antibiotics, Cymbalta [duloxetine hcl], Ivp dye [iodinated contrast media], Penicillin g, Codeine, Erythromycin, Fosfomycin, Levofloxacin, Nitrofurantoin, and Percocet [oxycodone-acetaminophen]   Social History   Socioeconomic History   Marital status: Widowed    Spouse name: Not on file   Number of children: 3   Years of education: Not on file   Highest education level: Not on file   Occupational History   Occupation: Retired  Tobacco Use   Smoking status: Former    Years: 20.00    Types: Cigarettes    Start date: 1957    Quit date: 1988    Years since quitting: 36.1   Smokeless tobacco: Never  Vaping Use   Vaping Use: Never used  Substance and Sexual Activity   Alcohol use: Not Currently   Drug use: Never   Sexual activity: Not Currently  Other Topics Concern   Not on file  Social History Narrative   Not on file   Social Determinants of Health   Financial Resource Strain: Onset  (07/01/2020)   Overall Financial Resource Strain (CARDIA)    Difficulty of Paying Living Expenses: Not hard at all  Food Insecurity: No [place] (07/01/2020)   Hunger Vital Sign    Worried About Running Out of Food in the Last Year: Never true    [place] in the Last Year: Never true  Transportation Needs: No Transportation Needs (07/01/2020)   PRAPARE - Hydrologist (Medical): No    Lack of Transportation (Non-Medical): No  Physical Activity: Sufficiently Active (02/02/2020)   Exercise Vital Sign    Days of Exercise per Week: 3 days    Minutes of Exercise per Session: 60 min  Stress: No Stress Concern Present (07/01/2020)   [place]    Feeling of Stress : Not at all  Social Connections: Moderately Isolated (07/01/2020)   Social Connection and Isolation Panel [NHANES]    Frequency of Communication with Friends and Family: More than three times a week    Frequency of Social Gatherings with Friends and Family: More than three times a week    Attends Religious Services: Never    Marine scientist or Organizations: Yes    Attends Music therapist: More than 4 times per year    Marital Status: Widowed     Family History: The patient's family history includes Arthritis in her maternal grandmother; Asthma in her paternal grandfather and paternal  grandmother; Cancer in her father, mother, and sister; Early death in her father; Hypertension in her father; Intellectual disability in her mother. There is no history of Colon cancer, Stomach cancer, Esophageal cancer, or Colon polyps.  ROS:   Please see the history of present illness.    All other systems reviewed and are negative.  EKGs/Labs/Other Studies Reviewed:    The following studies were reviewed today:  Lexiscan Myoview 05/26/2020: The left ventricular ejection fraction is normal (55-65%). Nuclear stress EF: 61%. Blood pressure demonstrated a normal response to exercise. There was no ST segment deviation noted during stress. The study is normal. This is a low risk study.   Normal resting and stress perfusion. No ischemia or infarction EF 61%  Echo 03/18/2020: 1. Left ventricular ejection fraction, by estimation, is 60 to 65%. The  left ventricle has normal function. The left ventricle has no regional  wall motion abnormalities. There is mild concentric  left ventricular  hypertrophy. Left ventricular diastolic  parameters are consistent with Grade I diastolic dysfunction (impaired  relaxation). Elevated left ventricular end-diastolic pressure.   2. Right ventricular systolic function is normal. The right ventricular  size is normal. Tricuspid regurgitation signal is inadequate for assessing  PA pressure.   3. The mitral valve is normal in structure. No evidence of mitral valve  regurgitation. No evidence of mitral stenosis.   4. The aortic valve is tricuspid. There is moderate calcification of the  aortic valve. There is mild thickening of the aortic valve. Aortic valve  regurgitation is not visualized. Mild to moderate aortic valve  sclerosis/calcification is present, without  any evidence of aortic stenosis.   5. The inferior vena cava is normal in size with greater than 50%  respiratory variability, suggesting right atrial pressure of 3 mmHg.   [place] Lower Ext Art  Seg Multi 01/22/2020: Right: Resting right ankle-brachial index indicates moderate right lower  extremity arterial disease.   Left: Resting left ankle-brachial index indicates mild left lower  extremity arterial disease. The left toe-brachial index is abnormal.   [place] LE Arterial Duplex 01/22/2020: Right: 50-74% stenosis noted in the common femoral artery. 30-49% stenosis  noted in the superficial femoral artery and/or popliteal artery.  Atherosclerosis in the common femoral, femoral, and popliteal and tibial  arteries.      Left: 50-74% stenosis noted in the common femoral artery. 30-49% stenosis  noted in the superficial femoral artery and/or popliteal artery.  Atherosclerosis in the common femoral, femoral, and popliteal and tibial  arteries.   EKG:   03/22/22: Normal sinus rhythm, rate 78, LVH with repolarization abnormalities, left axis deviation, poor R wave progression 07/12/2020: NSR, rate 92, poor R wave progression, LAD 04/06/2020: EKG was not ordered. 12/29/2019: normal sinus rhythm, rate 98, poor R wave progression, left axis deviation, T wave inversions in leads I/aVL  Recent Labs: 03/24/2021: TSH 2.180 07/05/2021: Hemoglobin 12.8; Platelets 275 11/01/2021: ALT 16; BNP 20.9; BUN 14; Creatinine, Ser 0.99; Potassium 4.5; Sodium 142  Recent Lipid Panel    Component Value Date/Time   CHOL 122 07/05/2021 1021   TRIG 207 (H) 07/05/2021 1021   HDL 39 (L) 07/05/2021 1021   CHOLHDL 3.1 07/05/2021 1021   LDLCALC 50 07/05/2021 1021    Physical Exam:    VS:  There were no vitals taken for this visit.    Wt Readings from Last 3 Encounters:  03/01/22 261 lb 9.6 oz (118.7 kg)  02/08/22 259 lb (117.5 kg)  10/24/21 266 lb 3.2 oz (120.7 kg)     GEN:  Well nourished, well developed in no acute distress HEENT: Normal NECK: No JVD; + left carotid bruit CARDIAC: RRR, 2 out of 6 systolic murmur RESPIRATORY:  Clear to auscultation without rales, wheezing or rhonchi  ABDOMEN: Soft,  non-tender, non-distended MUSCULOSKELETAL:  No edema; No deformity  SKIN: Warm and dry NEUROLOGIC:  Alert and oriented x 3 PSYCHIATRIC:  Normal affect   ASSESSMENT:    No diagnosis found.  PLAN:    Chest pain/dyspnea: Reports atypical chest pain but also having dyspnea with minimal exertion, could represent anginal equivalent.  She does have CAD risk factors including hypertension and hyperlipidemia, and has known PAD.  Echocardiogram 03/18/2020 showed normal biventricular function, no significant valvular disease.  Lexiscan Myoview on 05/26/2020 showed normal perfusion, EF 61%. -Reports no recent chest pain or dyspnea  Syncope: Reports syncopal episode in 2019, had loop recorder placed in [place].  Established with device  clinic for loop interrogations, have been unremarkable.  Reported no further syncopal episodes.  Device was turned off.    Heart murmur: 2/6 systolic murmur on exam.  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  On Eliquis.  Hypertension: On amlodipine 10 mg-valsartan 320 mg-HCTZ 25 mg, carvedilol 6.25 mg twice daily, hydralazine 25 mg twice daily.  Also was prescribed as needed clonidine.  BP appears reasonably controlled  Hyperlipidemia: On atorvastatin 10 mg daily.  LDL 50 on 07/05/2021  PAD: ABIs show moderate disease on right (0.74) and mild left (0.89).  Duplex shows 50 to 74% stenosis in common femoral artery bilaterally, 30 to 49% stenosis in SFA.  Denies any symptoms of claudication -Continue Eliquis, statin   OSA: on CPAP, reports compliance  RTC in 1 year   Medication Adjustments/Labs and Tests Ordered: Current medicines are reviewed at length with the patient today.  Concerns regarding medicines are outlined above.  No orders of the defined types were placed in this encounter.  No orders of the  defined types were placed in this encounter.   There are no Patient Instructions on file for this visit.   Signed, Donato Heinz, MD  03/22/2022 12:55 PM    [place]

## 2022-03-27 ENCOUNTER — Other Ambulatory Visit (HOSPITAL_BASED_OUTPATIENT_CLINIC_OR_DEPARTMENT_OTHER): Payer: Self-pay | Admitting: Family Medicine

## 2022-03-28 ENCOUNTER — Ambulatory Visit (INDEPENDENT_AMBULATORY_CARE_PROVIDER_SITE_OTHER): Payer: Medicare HMO

## 2022-03-28 ENCOUNTER — Encounter (HOSPITAL_BASED_OUTPATIENT_CLINIC_OR_DEPARTMENT_OTHER): Payer: Self-pay

## 2022-03-28 VITALS — Ht 63.0 in | Wt 260.0 lb

## 2022-03-28 DIAGNOSIS — Z0000 Encounter for general adult medical examination without abnormal findings: Secondary | ICD-10-CM | POA: Diagnosis not present

## 2022-03-28 DIAGNOSIS — Z13820 Encounter for screening for osteoporosis: Secondary | ICD-10-CM

## 2022-03-28 NOTE — Progress Notes (Signed)
Subjective:   [name] [name] is a 82 y.o. female who presents for Medicare Annual (Subsequent) preventive examination.  Review of Systems    Virtual Visit via Telephone Note  I connected with  [name] [name] on 03/28/22 at  3:15 PM EST by telephone and verified that I am speaking with the correct person using two identifiers.  Location: Patient: Home Provider: Office Persons participating in the virtual visit: patient/Nurse Health Advisor   I discussed the limitations, risks, security and privacy concerns of performing an evaluation and management service by telephone and the availability of in person appointments. The patient expressed understanding and agreed to proceed.  Interactive audio and video telecommunications were attempted between this nurse and patient, however failed, due to patient having technical difficulties OR patient did not have access to video capability.  We continued and completed visit with audio only.  Some vital signs may be absent or patient reported.   [name] Peaches, LPN  Cardiac Risk Factors include: advanced age (>85mn, >>70women);diabetes mellitus;hypertension     Objective:    Today's Vitals   03/28/22 1530  Weight: 260 lb (117.9 kg)  Height: 5' 3"$  (1.6 m)   Body mass index is 46.06 kg/m.     03/28/2022    3:42 PM 07/19/2021    1:59 PM 03/09/2021    4:03 PM 07/01/2020    1:34 PM 03/16/2020    2:00 PM 02/02/2020    3:09 PM  Advanced Directives  Does Patient Have a Medical Advance Directive? Yes Yes No Yes Yes Yes  Type of AParamed[place]of [place]ast[place] will [place]  [place] will;Out of facility DNR (pink MOST or yellow form) [place] Living will;Healthcare Power of [place] Chart? No - copy requested No - copy requested  No - copy requested No - copy requested No - copy requested   Would patient like information on creating a medical advance directive?   No - Patient declined       Current Medications (verified) Outpatient Encounter Medications as of 03/28/2022  Medication Sig   albuterol (VENTOLIN HFA) 108 (90 Base) MCG/ACT inhaler Inhale 1-2 puffs into the lungs every 4 (four) hours as needed for wheezing or shortness of breath.   ALPRAZolam (XANAX) 0.25 MG tablet TAKE (1) TABLET BY MOUTH TWICE DAILY AT 11AM & 5PM FOR ANXIETY. (Patient taking differently: Take 0.25 mg by mouth 2 (two) times daily. 1100 and 1500 And an extra 0.243moral twice daily as needed for anxiety)   AMBULATORY NON FORMULARY MEDICATION One four wheel rolling walker with seat and hand brakes. Brand/style covered by insurance. Medically necessary for ICD-10: R26.81, Z74.1   amLODIPine-Valsartan-HCTZ 5-160-12.5 MG TABS Take 2 tablets by mouth daily.   ARTHRITIS PAIN RELIEVING 0.075 % topical cream Apply 1 application topically 3 (three) times daily as needed (neuropathic pain in feet).   atorvastatin (LIPITOR) 10 MG tablet Take 1 tablet (10 mg total) by mouth daily.   Bacillus Coagulans-Inulin (PROBIOTIC FORMULA) 1-250 BILLION-MG CAPS TAKE 1 CAPSULE (250MG) BY MOUTH 2 TIMES A DAY.   blood glucose meter kit and supplies KIT Meter, test strips, lancets, and alcohol swabs. Use for testing blood sugar every morning before meals and up to 3 additional times per day as needed. Brand based on patient and insurance coverage. 100 strips with 99 refills, 100 lancets with 99 refills, 200 alcohol swabs with 99 refills.  Dx: E11.65   camphor-menthol (SARNA) lotion Apply 1 application topically as needed for itching. (Patient taking differently: Apply 1 application  topically 2 (two) times daily as needed for itching.)   carvedilol (COREG) 6.25 MG tablet Take 1 tablet (6.25 mg total) by mouth 2 (two) times daily with a meal.   cephALEXin (KEFLEX) 250 MG capsule Take 1 capsule (250 mg total) by mouth daily.   cetirizine  (ZYRTEC) 10 MG tablet TAKE 1 TABLET BY MOUTH ONCE A DAY.   cloNIDine (CATAPRES) 0.1 MG tablet Take 1 tablet (0.1 mg total) by mouth 3 (three) times daily as needed (systolic blood pressurre above 190).   cloNIDine (CATAPRES) 0.2 MG tablet TAKE (1) TABLET BY MOUTH ONCE EVERY HOUR AS NEEDED FOR SBP>190. **MAX 3 TABLETS (0.6MG) IN 24 HOURS**   colchicine-probenecid 0.5-500 MG tablet Take 1 tablet by mouth daily.   dapagliflozin propanediol (FARXIGA) 10 MG TABS tablet Take 1 tablet (10 mg total) by mouth daily before breakfast.   diphenhydrAMINE (BENADRYL) 25 mg capsule Take 1 capsule (25 mg total) by mouth every 8 (eight) hours as needed for itching or allergies.   EASYMAX TEST test strip    ELIQUIS 5 MG TABS tablet TAKE (1) TABLET BY MOUTH TWICE DAILY.   Estradiol 10 MCG TABS vaginal tablet Place 1 tab nightly for twice a week   famotidine (PEPCID) 20 MG tablet Take 1 tablet (20 mg total) by mouth daily as needed for heartburn or indigestion.   FEROSUL 325 (65 Fe) MG tablet TAKE (1) TABLET BY MOUTH ONCE DAILY.   fluconazole (DIFLUCAN) 150 MG tablet Take 1 tablet (150 mg total) by mouth once as needed for up to 1 dose.   Fluticasone-Umeclidin-Vilant (TRELEGY ELLIPTA) 100-62.5-25 MCG/ACT AEPB Inhale 1 puff into the lungs daily. ** RINSE MOUTH AFTER USE**   guaiFENesin (MUCINEX) 600 MG 12 hr tablet Take 2 tablets (1,200 mg total) by mouth 2 (two) times daily as needed for to loosen phlegm or cough. Use for 7 days then as needed if cough is improving.   hydrALAZINE (APRESOLINE) 25 MG tablet Take 1 tablet (25 mg total) by mouth 2 (two) times daily.   Lactase 9000 units TABS Take one tablet with the first bite of meals containing milk or dairy products. (Patient taking differently: Take 9,000 Units by mouth See admin instructions. Take 9000units with the first bite of meals containing milk or dairy products.)   mirabegron ER (MYRBETRIQ) 25 MG TB24 tablet Take 1 tablet (25 mg total) by mouth daily.    mirtazapine (REMERON) 7.5 MG tablet Take 1 tablet (7.5 mg total) by mouth at bedtime. For depression and anxiety symptoms   pantoprazole (PROTONIX) 40 MG tablet Take 1 tablet (40 mg total) by mouth daily.   PARoxetine (PAXIL) 40 MG tablet Take 40 mg by mouth daily.   psyllium (METAMUCIL) 58.6 % packet Take 1 packet by mouth daily.   No facility-administered encounter medications on file as of 03/28/2022.    Allergies (verified) Azithromycin, Bee venom, Erythromycin base, Metronidazole, Nitrofurantoin macrocrystal, Penicillins, Sulfa antibiotics, Cymbalta [duloxetine hcl], Ivp dye [iodinated contrast media], Penicillin g, Codeine, Erythromycin, Fosfomycin, Levofloxacin, Nitrofurantoin, and Percocet [oxycodone-acetaminophen]   History: Past Medical History:  Diagnosis Date   Acute non-recurrent pansinusitis 04/20/2021   Acute respiratory failure with hypoxia ([place]) 03/10/2021   Allergy    Anxiety    Arthritis    At risk for allergic reaction to medication 02/04/2021   Blood transfusion without reported diagnosis    Cancer ([place])  Melanoma on Great toe   Candida infection 04/20/2021   Carotid artery plaque, 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   Cataract    Closed fracture of upper end of humerus 11/03/2017   COPD (chronic obstructive pulmonary disease) ([name])    COVID-19 vaccine series started 07/01/2020   COVID-19 virus infection 03/09/2021   Depression    Diabetes 1.5, managed as type 2 ([place])    Encounter for Medicare annual wellness exam 07/01/2020   Encounter to establish care 06/03/2020   Essential hypertension 05/19/2019   Essential hypertension 05/19/2019   Female stress incontinence 02/14/2018   GERD (gastroesophageal reflux disease)    History of opioid abuse ([place]) 05/19/2019   Hospital discharge follow-up 03/24/2021   Hyperglycemia 05/19/2019   Hyperlipidemia    Hypertension    Irritation of right eye 12/09/2020   Menopause present 02/11/2018   Need for shingles vaccine 07/01/2020    Osteoporosis    Prediabetes 05/22/2019   Sleep apnea    Subacute cough 03/24/2021   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   Past Surgical History:  Procedure Laterality Date   ABDOMINAL HYSTERECTOMY  1980   CHOLECYSTECTOMY  1980   MELANOMA EXCISION Right    Great Toe   REPLACEMENT TOTAL KNEE Bilateral 2000   sacral neuromodulation     Family History  Problem Relation Age of Onset   Cancer Mother    Intellectual disability Mother    Cancer Father    Early death Father    Hypertension Father    Cancer Sister    Arthritis Maternal Grandmother    Asthma Paternal Grandmother    Asthma Paternal Grandfather    Colon cancer Neg Hx    Stomach cancer Neg Hx    Esophageal cancer Neg Hx    Colon polyps Neg Hx    Social History   Socioeconomic History   Marital status: Widowed    Spouse name: Not on file   Number of children: 3   Years of education: Not on file   Highest education level: Not on file  Occupational History   Occupation: Retired  Tobacco Use   Smoking status: Former    Years: 20.00    Types: Cigarettes    Start date: 1957    Quit date: 1988    Years since quitting: 36.1   Smokeless tobacco: Never  Vaping Use   Vaping Use: Never used  Substance and Sexual Activity   Alcohol use: Not Currently   Drug use: Never   Sexual activity: Not Currently  Other Topics Concern   Not on file  Social History Narrative   Not on file   Social Determinants of Health   Financial Resource Strain: Low Risk  (03/28/2022)   Overall Financial Resource Strain (CARDIA)    Difficulty of Paying Living Expenses: Not hard at all  Food Insecurity: No Food Insecurity (03/28/2022)   Hunger Vital Sign    Worried About Running Out of Food in the Last Year: Never true    [place] in the Last Year: Never true  Transportation Needs: No Transportation Needs (03/28/2022)   PRAPARE - Hydrologist (Medical): No    Lack of Transportation  (Non-Medical): No  Physical Activity: Inactive (03/28/2022)   Exercise Vital Sign    Days of Exercise per Week: 0 days    Minutes of Exercise per Session: 0 min  Stress: No Stress Concern Present (03/28/2022)   [place]  Feeling of Stress : Not at all  Social Connections: Moderately Isolated (03/28/2022)   Social Connection and Isolation Panel [NHANES]    Frequency of Communication with Friends and Family: More than three times a week    Frequency of Social Gatherings with Friends and Family: Never    Attends Religious Services: Never    Marine scientist or Organizations: No    Attends Music therapist: More than 4 times per year    Marital Status: Widowed    Tobacco Counseling Counseling given: Not Answered   Clinical Intake:  Pre-visit preparation completed: Yes  Pain : No/denies pain   Nutrition Risk Assessment:  Has the patient had any N/V/D within the last 2 months?  No  Does the patient have any non-healing wounds?  No  Has the patient had any unintentional weight loss or weight gain?  No   Diabetes:  Is the patient diabetic?  Yes  If diabetic, was a CBG obtained today?  Yes CBG 115 Taken by patient Did the patient bring in their glucometer from home?  No  How often do you monitor your CBG's? Daily.   Financial Strains and Diabetes Management:  Are you having any financial strains with the device, your supplies or your medication? No .  Does the patient want to be seen by Chronic Care Management for management of their diabetes?  No  Would the patient like to be referred to a Nutritionist or for Diabetic Management?  No   Diabetic Exams:  Diabetic Eye Exam: Completed No. Overdue for diabetic eye exam. Pt has been advised about the importance in completing this exam. A referral has been placed today. Message sent to referral coordinator for scheduling purposes. Advised pt to  expect a call from office referred to regarding appt.  Diabetic Foot Exam: Completed No. Pt has been advised about the importance in completing this exam. Pt is scheduled for diabetic foot exam on Followed by PCP.    BMI - recorded: 46.06 Nutritional Status: BMI > 30  Obese Nutritional Risks: None Diabetes: No  How often do you need to have someone help you when you read instructions, pamphlets, or other written materials from your doctor or pharmacy?: 1 - Never  Diabetic?  Yes  Interpreter Needed?: No  Information entered by :: Rolene Arbour LPN   Activities of Daily Living    03/28/2022    3:40 PM 07/14/2021    4:35 PM  In your present state of health, do you have any difficulty performing the following activities:  Hearing? 0 0  Vision? 0 0  Difficulty concentrating or making decisions? 0 0  Walking or climbing stairs? 0 0  Dressing or bathing? 0 0  Doing errands, shopping? 0 0  Preparing Food and eating ? N   Using the Toilet? N   In the past six months, have you accidently leaked urine? Y   Comment Followed by Urologist   Do you have problems with loss of bowel control? N   Managing your Medications? N   Managing your Finances? N   Housekeeping or managing your Housekeeping? N     Patient Care Team: [place], Blondell Reveal, MD as PCP - General (Family Medicine)  Indicate any recent Medical Services you may have received from other than [facility] providers in the past year (date may be approximate).     Assessment:   This is a [facility] wellness [facility].  Hearing/Vision screen Hearing Screening - Comments::  Denies hearing difficulties   Vision Screening - Comments:: Wears rx glasses - up to date with routine eye exams with  Patient deferred  Dietary issues and exercise activities discussed: Exercise limited by: None identified   Goals Addressed               This Visit's Progress     Patient Stated (pt-stated)        Increase activity.        Depression Screen    03/28/2022    3:39 PM 07/14/2021    4:35 PM 05/05/2021    1:54 PM 11/04/2020    1:18 PM 07/01/2020    1:30 PM 06/03/2020    2:28 PM 02/02/2020    3:13 PM  PHQ 2/9 Scores  PHQ - 2 Score 0 0 0 5 2 2 1  $ PHQ- 9 Score    13  4   Exception Documentation  Medical reason Medical reason        Fall Risk    03/28/2022    3:42 PM 07/14/2021    4:35 PM 05/05/2021    1:53 PM 04/20/2021    1:27 PM 11/04/2020    1:30 PM  [place] in the past year? 0 0 0 0 0  Number falls in past yr: 0 0 0 0 0  Injury with Fall? 0 0 0 0 0  Risk for fall due to : No Fall Risks No Fall Risks No Fall Risks Impaired mobility;History of fall(s) No Fall Risks  Follow up Falls prevention discussed Falls evaluation completed;Education provided  Falls evaluation completed;Education provided Falls evaluation completed    [place]:  Any stairs in or around the home? No  If so, are there any without handrails? No  Home free of loose throw rugs in walkways, pet beds, electrical cords, etc? Yes  Adequate lighting in your home to reduce risk of falls? Yes   ASSISTIVE DEVICES UTILIZED TO PREVENT FALLS:  Life alert? Yes  Use of a cane, walker or w/c? Yes  Grab bars in the bathroom? Yes  Shower chair or bench in shower? No  Elevated toilet seat or a handicapped toilet? No   TIMED UP AND GO:  Was the test performed? No . Audio Visit   Cognitive Function:        03/28/2022    3:42 PM 07/01/2020    1:40 PM  6CIT Screen  What Year? 0 points 4 points  What month? 0 points 0 points  What time? 0 points 0 points  Count back from 20 0 points 0 points  Months in reverse 2 points 0 points  Repeat phrase 6 points 2 points  Total Score 8 points 6 points    Immunizations Immunization History  Administered Date(s) Administered   Influenza, High Dose Seasonal PF 11/07/2019   Influenza-Unspecified 10/21/2020   Moderna Sars-Covid-2 Vaccination 05/05/2019,  12/18/2019   Pneumococcal Conjugate-13 11/07/2018   Pneumococcal Polysaccharide-23 02/07/2012   Zoster Recombinat (Shingrix) 12/10/2020      Flu Vaccine status: Up to date  Pneumococcal vaccine status: Up to date  Covid-19 vaccine status: Completed vaccines  Qualifies for Shingles Vaccine? Yes   Zostavax completed Yes   Shingrix Completed?: Yes  Screening Tests Health Maintenance  Topic Date Due   DEXA SCAN  Never done   HEMOGLOBIN A1C  01/05/2022   OPHTHALMOLOGY EXAM  03/28/2022 (Originally 11/16/1950)   Diabetic kidney evaluation - Urine ACR  03/29/2022 (Originally  11/16/1958)   FOOT EXAM  03/29/2022 (Originally 11/16/1950)   COVID-19 Vaccine (3 - Moderna risk series) 04/13/2022 (Originally 01/15/2020)   INFLUENZA VACCINE  05/07/2022 (Originally 09/06/2021)   Diabetic kidney evaluation - eGFR measurement  11/02/2022   Medicare Annual Wellness (AWV)  03/29/2023   Pneumonia Vaccine 82+ Years old  Completed   HPV VACCINES  Aged Out   DTaP/Tdap/Td  Discontinued   Zoster Vaccines- Shingrix  Discontinued    Health Maintenance  Health Maintenance Due  Topic Date Due   DEXA SCAN  Never done   HEMOGLOBIN A1C  01/05/2022    Colorectal cancer screening: No longer required.   Mammogram status: No longer required due to Age.  Bone Density status: Ordered 03/28/22. Pt provided with contact info and advised to call to schedule appt.  Lung Cancer Screening: (Low Dose CT Chest recommended if Age 16-80 years, 30 pack-year currently smoking OR have quit w/in 15years.) does not qualify.     Additional Screening:  Hepatitis C Screening: does not qualify;   Vision Screening: Recommended annual ophthalmology exams for early detection of glaucoma and other disorders of the eye. Is the patient up to date with their annual eye exam?  No Who is the provider or what is the name of the office in which the patient attends annual eye exams? Patient deferred If pt is not established with a  provider, would they like to be referred to a provider to establish care? No .   Dental Screening: Recommended annual dental exams for proper oral hygiene  Community Resource Referral / Chronic Care Management:  CRR required this visit?  No   CCM required this visit?  No      Plan:     I have personally reviewed and noted the following in the patient's chart:   Medical and social history Use of alcohol, tobacco or illicit drugs  Current medications and supplements including opioid prescriptions. Patient is not currently taking opioid prescriptions. Functional ability and status Nutritional status Physical activity Advanced directives List of other physicians Hospitalizations, surgeries, and ER visits in previous 12 months Vitals Screenings to include cognitive, depression, and falls Referrals and appointments  In addition, I have reviewed and discussed with patient certain preventive protocols, quality metrics, and best practice recommendations. A written personalized care plan for preventive services as well as general preventive health recommendations were provided to patient.     [name] Peaches, LPN   [id number]   Nurse Notes: Patient due Diabetic kidney evaluation-Urine  ACR and Hemoglobin A1C

## 2022-03-28 NOTE — Patient Instructions (Addendum)
[name] [name] , Thank you for taking time to come for your Medicare Wellness Visit. I appreciate your ongoing commitment to your health goals. Please review the following plan we discussed and let me know if I can assist you in the future.   These are the goals we discussed:  Goals       Patient Stated (pt-stated)      Increase activity.      Patient Stated (pt-stated)      "Walk farther without shortness of breath and other problems"        This is a list of the screening recommended for you and due dates:  Health Maintenance  Topic Date Due   DEXA scan (bone density measurement)  Never done   Hemoglobin A1C  01/05/2022   Eye exam for diabetics  03/28/2022*   Yearly kidney health urinalysis for diabetes  03/29/2022*   Complete foot exam   03/29/2022*   COVID-19 Vaccine (3 - Moderna risk series) 04/13/2022*   Flu Shot  05/07/2022*   Yearly kidney function blood test for diabetes  11/02/2022   Medicare Annual Wellness Visit  03/29/2023   Pneumonia Vaccine  Completed   HPV Vaccine  Aged Out   DTaP/Tdap/Td vaccine  Discontinued   Zoster (Shingles) Vaccine  Discontinued  *Topic was postponed. The date shown is not the original due date.    Advanced directives: Please bring a copy of your health care power of attorney and living will to the office to be added to your chart at your convenience.   Conditions/risks identified: None  Next appointment: Follow up in one year for your annual wellness visit \   Preventive Care 65 Years and Older, Female Preventive care refers to lifestyle choices and visits with your health care provider that can promote health and wellness. What does preventive care include? A yearly physical exam. This is also called an annual well check. Dental exams once or twice a year. Routine eye exams. Ask your health care provider how often you should have your eyes checked. Personal lifestyle choices, including: Daily care of your teeth and gums. Regular  physical activity. Eating a healthy diet. Avoiding tobacco and drug use. Limiting alcohol use. Practicing safe sex. Taking low-dose aspirin every day. Taking vitamin and mineral supplements as recommended by your health care provider. What happens during an annual well check? The services and screenings done by your health care provider during your annual well check will depend on your age, overall health, lifestyle risk factors, and family history of disease. Counseling  Your health care provider may ask you questions about your: Alcohol use. Tobacco use. Drug use. Emotional well-being. Home and relationship well-being. Sexual activity. Eating habits. History of falls. Memory and ability to understand (cognition). Work and work Statistician. Reproductive health. Screening  You may have the following tests or measurements: Height, weight, and BMI. Blood pressure. Lipid and cholesterol levels. These may be checked every 5 years, or more frequently if you are over 66 years old. Skin check. Lung cancer screening. You may have this screening every year starting at age 80 if you have a 30-pack-year history of smoking and currently smoke or have quit within the past 15 years. Fecal occult blood test (FOBT) of the stool. You may have this test every year starting at age 32. Flexible sigmoidoscopy or colonoscopy. You may have a sigmoidoscopy every 5 years or a colonoscopy every 10 years starting at age 45. Hepatitis C blood test. Hepatitis B blood test.  Sexually transmitted disease (STD) testing. Diabetes screening. This is done by checking your blood sugar (glucose) after you have not eaten for a while (fasting). You may have this done every 1-3 years. Bone density scan. This is done to screen for osteoporosis. You may have this done starting at age 15. Mammogram. This may be done every 1-2 years. Talk to your health care provider about how often you should have regular mammograms. Talk  with your health care provider about your test results, treatment options, and if necessary, the need for more tests. Vaccines  Your health care provider may recommend certain vaccines, such as: Influenza vaccine. This is recommended every year. Tetanus, diphtheria, and acellular pertussis (Tdap, Td) vaccine. You may need a Td booster every 10 years. Zoster vaccine. You may need this after age 10. Pneumococcal 13-valent conjugate (PCV13) vaccine. One dose is recommended after age 62. Pneumococcal polysaccharide (PPSV23) vaccine. One dose is recommended after age 68. Talk to your health care provider about which screenings and vaccines you need and how often you need them. This information is not intended to replace advice given to you by your health care provider. Make sure you discuss any questions you have with your health care provider. Document Released: 02/19/2015 Document Revised: 10/13/2015 Document Reviewed: 11/24/2014 Elsevier Interactive Patient Education  2017 [place] Prevention in the Home Falls can cause injuries. They can happen to people of all ages. There are many things you can do to make your home safe and to help prevent falls. What can I do on the outside of my home? Regularly fix the edges of walkways and driveways and fix any cracks. Remove anything that might make you trip as you walk through a door, such as a raised step or threshold. Trim any bushes or trees on the path to your home. Use bright outdoor lighting. Clear any walking paths of anything that might make someone trip, such as rocks or tools. Regularly check to see if handrails are loose or broken. Make sure that both sides of any steps have handrails. Any raised decks and porches should have guardrails on the edges. Have any leaves, snow, or ice cleared regularly. Use sand or salt on walking paths during winter. Clean up any spills in your garage right away. This includes oil or grease  spills. What can I do in the bathroom? Use night lights. Install grab bars by the toilet and in the tub and shower. Do not use towel bars as grab bars. Use non-skid mats or decals in the tub or shower. If you need to sit down in the shower, use a plastic, non-slip stool. Keep the floor dry. Clean up any water that spills on the floor as soon as it happens. Remove soap buildup in the tub or shower regularly. Attach bath mats securely with double-sided non-slip rug tape. Do not have throw rugs and other things on the floor that can make you trip. What can I do in the bedroom? Use night lights. Make sure that you have a light by your bed that is easy to reach. Do not use any sheets or blankets that are too big for your bed. They should not hang down onto the floor. Have a firm chair that has side arms. You can use this for support while you get dressed. Do not have throw rugs and other things on the floor that can make you trip. What can I do in the kitchen? Clean up any spills right away.  Avoid walking on wet floors. Keep items that you use a lot in easy-to-reach places. If you need to reach something above you, use a strong step stool that has a grab bar. Keep electrical cords out of the way. Do not use floor polish or wax that makes floors slippery. If you must use wax, use non-skid floor wax. Do not have throw rugs and other things on the floor that can make you trip. What can I do with my stairs? Do not leave any items on the stairs. Make sure that there are handrails on both sides of the stairs and use them. Fix handrails that are broken or loose. Make sure that handrails are as long as the stairways. Check any carpeting to make sure that it is firmly attached to the stairs. Fix any carpet that is loose or worn. Avoid having throw rugs at the top or bottom of the stairs. If you do have throw rugs, attach them to the floor with carpet tape. Make sure that you have a light switch at the  top of the stairs and the bottom of the stairs. If you do not have them, ask someone to add them for you. What else can I do to help prevent falls? Wear shoes that: Do not have high heels. Have rubber bottoms. Are comfortable and fit you well. Are closed at the toe. Do not wear sandals. If you use a stepladder: Make sure that it is fully opened. Do not climb a closed stepladder. Make sure that both sides of the stepladder are locked into place. Ask someone to hold it for you, if possible. Clearly mark and make sure that you can see: Any grab bars or handrails. First and last steps. Where the edge of each step is. Use tools that help you move around (mobility aids) if they are needed. These include: Canes. Walkers. Scooters. Crutches. Turn on the lights when you go into a dark area. Replace any light bulbs as soon as they burn out. Set up your furniture so you have a clear path. Avoid moving your furniture around. If any of your floors are uneven, fix them. If there are any pets around you, be aware of where they are. Review your medicines with your doctor. Some medicines can make you feel dizzy. This can increase your chance of falling. Ask your doctor what other things that you can do to help prevent falls. This information is not intended to replace advice given to you by your health care provider. Make sure you discuss any questions you have with your health care provider. Document Released: 11/19/2008 Document Revised: 07/01/2015 Document Reviewed: 02/27/2014 Elsevier Interactive Patient Education  2017 [employer].

## 2022-03-29 ENCOUNTER — Telehealth (HOSPITAL_BASED_OUTPATIENT_CLINIC_OR_DEPARTMENT_OTHER): Payer: Self-pay

## 2022-03-29 NOTE — Telephone Encounter (Signed)
[name] [name] called in regards to our patient requesting an order for physical therapy for her.

## 2022-03-31 ENCOUNTER — Encounter (HOSPITAL_COMMUNITY): Payer: Medicare HMO

## 2022-04-02 ENCOUNTER — Encounter (HOSPITAL_BASED_OUTPATIENT_CLINIC_OR_DEPARTMENT_OTHER): Payer: Self-pay | Admitting: Family Medicine

## 2022-04-06 ENCOUNTER — Ambulatory Visit (HOSPITAL_COMMUNITY): Admission: RE | Admit: 2022-04-06 | Payer: Medicare HMO | Source: Ambulatory Visit

## 2022-04-10 ENCOUNTER — Encounter: Payer: Self-pay | Admitting: Family Medicine

## 2022-04-10 ENCOUNTER — Encounter: Payer: Self-pay | Admitting: Internal Medicine

## 2022-04-10 ENCOUNTER — Ambulatory Visit (INDEPENDENT_AMBULATORY_CARE_PROVIDER_SITE_OTHER): Payer: Medicare HMO | Admitting: Family Medicine

## 2022-04-10 VITALS — BP 137/69 | HR 86 | Temp 98.3°F | Resp 20 | Ht 63.0 in | Wt 257.3 lb

## 2022-04-10 DIAGNOSIS — E1149 Type 2 diabetes mellitus with other diabetic neurological complication: Secondary | ICD-10-CM | POA: Diagnosis not present

## 2022-04-10 DIAGNOSIS — J410 Simple chronic bronchitis: Secondary | ICD-10-CM

## 2022-04-10 DIAGNOSIS — R2689 Other abnormalities of gait and mobility: Secondary | ICD-10-CM

## 2022-04-10 DIAGNOSIS — N3946 Mixed incontinence: Secondary | ICD-10-CM | POA: Diagnosis not present

## 2022-04-10 DIAGNOSIS — G4733 Obstructive sleep apnea (adult) (pediatric): Secondary | ICD-10-CM | POA: Diagnosis not present

## 2022-04-10 DIAGNOSIS — N819 Female genital prolapse, unspecified: Secondary | ICD-10-CM

## 2022-04-10 DIAGNOSIS — Z7689 Persons encountering health services in other specified circumstances: Secondary | ICD-10-CM

## 2022-04-10 LAB — POCT GLYCOSYLATED HEMOGLOBIN (HGB A1C): Hemoglobin A1C: 5.9 % — AB (ref 4.0–5.6)

## 2022-04-10 NOTE — Progress Notes (Addendum)
New Patient Office Visit  Subjective    Patient ID: [name] [name], female    DOB: 06-13-1940  Age: 82 y.o. MRN: [id number]  CC:  Chief Complaint  Patient presents with   Establish Care   Hypertension   Diabetes    HPI [name] [name] presents to establish care. Pt is new to me.  She is living at [facility]. She has been here since October 2023. She use to see Jacolyn Reedy NP. Pt reports she had Covid and was in hospital for 4 days. When she was discharged, she was on this and wondered how long she was to be on this.  She reports a hx of blood clot in the hospital but no record of this. No ultrasound was done for my review. No mention of blood clot. Pt has no hx of atrial fibrillation.  Pt would like to restart PT for balance issues. She uses a walker.  Pt is diabetic. Taking Farxiga '10mg'$ . Checking sugars daily and running 90-130s. Pt has cardiologist that she is seeing once a year for CAD and bilateral carotid occlusion.  She has gastroenterologist that she is seeing for diarrhea. She goes back to see them in a month.  She reports a fall on Friday while going up the stairs to the bus. Now she has lower back pain but improving.     Outpatient Encounter Medications as of 04/10/2022  Medication Sig   albuterol (VENTOLIN HFA) 108 (90 Base) MCG/ACT inhaler Inhale 1-2 puffs into the lungs every 4 (four) hours as needed for wheezing or shortness of breath.   ALPRAZolam (XANAX) 0.25 MG tablet TAKE (1) TABLET BY MOUTH TWICE DAILY AT 11AM & 5PM FOR ANXIETY. (Patient taking differently: Take 0.25 mg by mouth 2 (two) times daily. 1100 and 1500 And an extra 0.'25mg'$  oral twice daily as needed for anxiety)   AMBULATORY NON FORMULARY MEDICATION One four wheel rolling walker with seat and hand brakes. Brand/style covered by insurance. Medically necessary for ICD-10: R26.81, Z74.1   amLODIPine-Valsartan-HCTZ 5-160-12.5 MG TABS Take 2 tablets by mouth daily.   ARTHRITIS PAIN  RELIEVING 0.075 % topical cream Apply 1 application topically 3 (three) times daily as needed (neuropathic pain in feet).   atorvastatin (LIPITOR) 10 MG tablet Take 1 tablet (10 mg total) by mouth daily.   Bacillus Coagulans-Inulin (PROBIOTIC FORMULA) 1-250 BILLION-MG CAPS TAKE 1 CAPSULE ('250MG'$ ) BY MOUTH 2 TIMES A DAY.   blood glucose meter kit and supplies KIT Meter, test strips, lancets, and alcohol swabs. Use for testing blood sugar every morning before meals and up to 3 additional times per day as needed. Brand based on patient and insurance coverage. 100 strips with 99 refills, 100 lancets with 99 refills, 200 alcohol swabs with 99 refills. Dx: E11.65   camphor-menthol (SARNA) lotion Apply 1 application topically as needed for itching. (Patient taking differently: Apply 1 application  topically 2 (two) times daily as needed for itching.)   carvedilol (COREG) 6.25 MG tablet Take 1 tablet (6.25 mg total) by mouth 2 (two) times daily with a meal.   cetirizine (ZYRTEC) 10 MG tablet TAKE 1 TABLET BY MOUTH ONCE A DAY.   cloNIDine (CATAPRES) 0.1 MG tablet Take 1 tablet (0.1 mg total) by mouth 3 (three) times daily as needed (systolic blood pressurre above 190).   colchicine-probenecid 0.5-500 MG tablet Take 1 tablet by mouth daily.   dapagliflozin propanediol (FARXIGA) 10 MG TABS tablet Take 1 tablet (10 mg total) by mouth daily before  breakfast.   diphenhydrAMINE (BENADRYL) 25 mg capsule Take 1 capsule (25 mg total) by mouth every 8 (eight) hours as needed for itching or allergies.   EASYMAX TEST test strip    Estradiol 10 MCG TABS vaginal tablet Place 1 tab nightly for twice a week   famotidine (PEPCID) 20 MG tablet Take 1 tablet (20 mg total) by mouth daily as needed for heartburn or indigestion.   FEROSUL 325 (65 Fe) MG tablet TAKE (1) TABLET BY MOUTH ONCE DAILY.   Fluticasone-Umeclidin-Vilant (TRELEGY ELLIPTA) 100-62.5-25 MCG/ACT AEPB Inhale 1 puff into the lungs daily. ** RINSE MOUTH AFTER USE**    guaiFENesin (MUCINEX) 600 MG 12 hr tablet Take 2 tablets (1,200 mg total) by mouth 2 (two) times daily as needed for to loosen phlegm or cough. Use for 7 days then as needed if cough is improving.   hydrALAZINE (APRESOLINE) 25 MG tablet Take 1 tablet (25 mg total) by mouth 2 (two) times daily.   Lactase 9000 units TABS Take one tablet with the first bite of meals containing milk or dairy products. (Patient taking differently: Take 9,000 Units by mouth See admin instructions. Take 9000units with the first bite of meals containing milk or dairy products.)   mirabegron ER (MYRBETRIQ) 25 MG TB24 tablet Take 1 tablet (25 mg total) by mouth daily.   pantoprazole (PROTONIX) 40 MG tablet Take 1 tablet (40 mg total) by mouth daily.   PARoxetine (PAXIL) 40 MG tablet Take 40 mg by mouth daily.   psyllium (METAMUCIL) 58.6 % packet Take 1 packet by mouth daily.   [DISCONTINUED] ELIQUIS 5 MG TABS tablet TAKE (1) TABLET BY MOUTH TWICE DAILY.   [DISCONTINUED] fluconazole (DIFLUCAN) 150 MG tablet Take 1 tablet (150 mg total) by mouth once as needed for up to 1 dose.   [DISCONTINUED] mirtazapine (REMERON) 7.5 MG tablet Take 1 tablet (7.5 mg total) by mouth at bedtime. For depression and anxiety symptoms   [DISCONTINUED] cephALEXin (KEFLEX) 250 MG capsule Take 1 capsule (250 mg total) by mouth daily. (Patient not taking: Reported on 04/10/2022)   [DISCONTINUED] cloNIDine (CATAPRES) 0.2 MG tablet TAKE (1) TABLET BY MOUTH ONCE EVERY HOUR AS NEEDED FOR SBP>190. **MAX 3 TABLETS (0.'6MG'$ ) IN 24 HOURS** (Patient not taking: Reported on 04/10/2022)   No facility-administered encounter medications on file as of 04/10/2022.    Past Medical History:  Diagnosis Date   Allergy    Anxiety    Arthritis    At risk for allergic reaction to medication 02/04/2021   Blood transfusion without reported diagnosis    Cancer ([place])    Melanoma on Great toe   Candida infection 04/20/2021   Carotid artery plaque, 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    Cataract    Closed fracture of upper end of humerus 11/03/2017   COPD (chronic obstructive pulmonary disease) ([place])    Depression    Diabetes 1.5, managed as type 2 ([place])    Encounter to establish care 06/03/2020   Essential hypertension 05/19/2019   Female stress incontinence 02/14/2018   GERD (gastroesophageal reflux disease)    History of opioid abuse ([place]) 05/19/2019   Hyperglycemia 05/19/2019   Hyperlipidemia    Hypertension    Menopause present 02/11/2018   Need for shingles vaccine 07/01/2020   Osteoporosis    Sleep apnea    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    Past Surgical History:  Procedure Laterality Date   [place] Right  Great Toe   REPLACEMENT TOTAL KNEE Bilateral 2000   sacral neuromodulation      Family History  Problem Relation Age of Onset   Cancer Mother    Intellectual disability Mother    Cancer Father    Early death Father    Hypertension Father    Cancer Sister    Arthritis Maternal Grandmother    Asthma Paternal Grandmother    Asthma Paternal Grandfather    Colon cancer Neg Hx    Stomach cancer Neg Hx    Esophageal cancer Neg Hx    Colon polyps Neg Hx     Social History   Socioeconomic History   Marital status: Widowed    Spouse name: Not on file   Number of children: 3   Years of education: Not on file   Highest education level: Not on file  Occupational History   Occupation: Retired  Tobacco Use   Smoking status: Former    Years: 20.00    Types: Cigarettes    Start date: 1957    Quit date: 1988    Years since quitting: 36.1   Smokeless tobacco: Never  Vaping Use   Vaping Use: Never used  Substance and Sexual Activity   Alcohol use: Not Currently   Drug use: Never   Sexual activity: Not Currently  Other Topics Concern   Not on file  Social History Narrative   Not on file   Social Determinants of Health   Financial Resource Strain: Low Risk   (03/28/2022)   Overall Financial Resource Strain (CARDIA)    Difficulty of Paying Living Expenses: Not hard at all  Food Insecurity: No Food Insecurity (03/28/2022)   Hunger Vital Sign    Worried About Running Out of Food in the Last Year: Never true    [place] in the Last Year: Never true  Transportation Needs: No Transportation Needs (03/28/2022)   PRAPARE - Hydrologist (Medical): No    Lack of Transportation (Non-Medical): No  Physical Activity: Inactive (03/28/2022)   Exercise Vital Sign    Days of Exercise per Week: 0 days    Minutes of Exercise per Session: 0 min  Stress: No Stress Concern Present (03/28/2022)   [place]    Feeling of Stress : Not at all  Social Connections: Moderately Isolated (03/28/2022)   Social Connection and Isolation Panel [NHANES]    Frequency of Communication with Friends and Family: More than three times a week    Frequency of Social Gatherings with Friends and Family: Never    Attends Religious Services: Never    Marine scientist or Organizations: No    Attends Music therapist: More than 4 times per year    Marital Status: Widowed  Intimate Partner Violence: Not At Risk (03/28/2022)   Humiliation, Afraid, Rape, and Kick questionnaire    Fear of Current or Ex-Partner: No    Emotionally Abused: No    Physically Abused: No    Sexually Abused: No    Review of Systems  Gastrointestinal:  Positive for diarrhea.  Neurological:        Balance disorder  All other systems reviewed and are negative.       Objective    BP 137/69   Pulse 86   Temp 98.3 F (36.8 C) (Oral)   Resp 20   Ht '5\' 3"'$  (1.6 m)   Wt 257 lb  4.8 oz (116.7 kg)   SpO2 96%   BMI 45.58 kg/m   Physical Exam Vitals and nursing note reviewed.  Constitutional:      Appearance: Normal appearance. She is obese.  HENT:     Head: Normocephalic and atraumatic.      Right Ear: External ear normal.     Left Ear: External ear normal.     Nose: Nose normal.     Mouth/Throat:     Mouth: Mucous membranes are moist.  Eyes:     Extraocular Movements: Extraocular movements intact.  Cardiovascular:     Rate and Rhythm: Normal rate and regular rhythm.     Pulses: Normal pulses.     Heart sounds: Normal heart sounds.  Pulmonary:     Effort: Pulmonary effort is normal.     Breath sounds: Normal breath sounds.  Skin:    General: Skin is warm.     Capillary Refill: Capillary refill takes less than 2 seconds.  Neurological:     General: No focal deficit present.     Mental Status: She is alert and oriented to person, place, and time. Mental status is at baseline.     Gait: Gait abnormal.     Comments: Walks with walker  Psychiatric:        Mood and Affect: Mood normal.        Behavior: Behavior normal.        Thought Content: Thought content normal.        Judgment: Judgment normal.        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 Advised to continue taking the Farxiga '10mg'$ . Will follow up in 6 months. Lab Results  Component Value Date   HGBA1C 5.9 (A) 04/10/2022    Medication list reviewed and reconciled with pt today. To continue all medications for chronic conditions. Stable and chronic.  Pt would like to restart PT. She will provide me the information to the company that comes out to her community for a referral.   Pt asked about the Eliquis she has been taking since  having Covid 03/2021. Pt mentions hx of blood clot in her legs at that time but I do not see any Ultrasound venous of legs in her chart. Also hospital discharge gave short supply of Eliquis (presumed to be dvt prophylaxis with covid?). She is also seeing a cardiologist who never prescribed her this medication.    Return in about 6 months (around 10/11/2022) for Diabetes.   Leeanne Rio, MD

## 2022-04-12 ENCOUNTER — Telehealth: Payer: Self-pay | Admitting: Family Medicine

## 2022-04-12 DIAGNOSIS — Z9989 Dependence on other enabling machines and devices: Secondary | ICD-10-CM

## 2022-04-12 DIAGNOSIS — R2689 Other abnormalities of gait and mobility: Secondary | ICD-10-CM

## 2022-04-12 DIAGNOSIS — R2681 Unsteadiness on feet: Secondary | ICD-10-CM

## 2022-04-12 NOTE — Telephone Encounter (Signed)
Hoke from [facility] is calling to state that patient had a fall sometime this week off the assisted living facilities' bus. Facility reached out to home health agency to attempt to obtain order for a home health physical therapist to assess her. Requested call back at [phone] regarding a diagnosis code.

## 2022-04-13 NOTE — Telephone Encounter (Signed)
Referral, clinical notes and copies of insurance cards have been faxed to [employer] at [phone]. Office will contact patient to arrange PT appointment dates.

## 2022-04-13 NOTE — Telephone Encounter (Signed)
Order placed for [facility] for PT. Can fax the order to them that has listed diagnoses codes, under referrals tab

## 2022-04-17 ENCOUNTER — Telehealth: Payer: Self-pay | Admitting: Family Medicine

## 2022-04-17 ENCOUNTER — Ambulatory Visit (HOSPITAL_COMMUNITY): Admission: RE | Admit: 2022-04-17 | Payer: Medicare HMO | Source: Ambulatory Visit

## 2022-04-17 NOTE — Telephone Encounter (Signed)
Hoke from [facility] calling to request diagnosis code pertaining to patient fall. Maurine Minister states that they cannot begin care of patient without a diagnosis regarding reasoning for the fall. Requested provider addend or make a new referral. Please advise. Buel Ream

## 2022-04-17 NOTE — Telephone Encounter (Signed)
Noted and aware. Thank you 

## 2022-04-17 NOTE — Telephone Encounter (Signed)
This exact same message was sent to me on 3/6; please see TE dated 3/6.

## 2022-04-17 NOTE — Telephone Encounter (Signed)
Returned [place] from [place] call back, Home health team needs medical diagnosis code so that medicare will pay for services.  Maurine Minister was given the following codes according to clinical notes;  R26.89 Balance disorder R26.81 Gait Instability  R99.89 uses Roller walker E11.49 Type 2 Diabetes Mellitus  with Neurological complications   Hokes states he will get paperwork processed and services scheduled for patient.

## 2022-04-24 ENCOUNTER — Telehealth: Payer: Self-pay

## 2022-04-24 NOTE — Telephone Encounter (Signed)
Please advise:  [facility] requesting Verbal Orders for at home physical therapy :    2X's a week for 3 weeks and 1x's a week for 5 weeks

## 2022-04-25 ENCOUNTER — Ambulatory Visit (HOSPITAL_COMMUNITY): Payer: Medicare HMO | Attending: Cardiology

## 2022-04-25 ENCOUNTER — Encounter (HOSPITAL_COMMUNITY): Payer: Self-pay | Admitting: Cardiology

## 2022-04-27 ENCOUNTER — Ambulatory Visit (HOSPITAL_COMMUNITY)
Admission: RE | Admit: 2022-04-27 | Discharge: 2022-04-27 | Disposition: A | Discharge status: Home / Self Care | Payer: Medicare HMO | Source: Ambulatory Visit | Attending: Cardiology | Admitting: Cardiology

## 2022-04-27 DIAGNOSIS — R0989 Other specified symptoms and signs involving the circulatory and respiratory systems: Secondary | ICD-10-CM | POA: Diagnosis not present

## 2022-04-27 NOTE — Telephone Encounter (Signed)
Verbal orders was given to [facility] for PT to be given at home

## 2022-05-01 ENCOUNTER — Other Ambulatory Visit: Payer: Self-pay | Admitting: *Deleted

## 2022-05-01 DIAGNOSIS — I6523 Occlusion and stenosis of bilateral carotid arteries: Secondary | ICD-10-CM

## 2022-05-18 ENCOUNTER — Telehealth: Payer: Self-pay | Admitting: Family Medicine

## 2022-05-18 NOTE — Telephone Encounter (Signed)
Lenox P[place] from home health caring for patient called to notify PCP that patient had a fall this morning. States patient fell from her bed and that patient is okay but is making provider aware of fall per company protocol. Katha Hamming

## 2022-06-20 ENCOUNTER — Other Ambulatory Visit (HOSPITAL_BASED_OUTPATIENT_CLINIC_OR_DEPARTMENT_OTHER): Payer: Self-pay | Admitting: Family Medicine

## 2022-06-20 DIAGNOSIS — I2510 Atherosclerotic heart disease of native coronary artery without angina pectoris: Secondary | ICD-10-CM

## 2022-06-20 DIAGNOSIS — E1169 Type 2 diabetes mellitus with other specified complication: Secondary | ICD-10-CM

## 2022-06-20 DIAGNOSIS — E1165 Type 2 diabetes mellitus with hyperglycemia: Secondary | ICD-10-CM

## 2022-06-20 DIAGNOSIS — E1159 Type 2 diabetes mellitus with other circulatory complications: Secondary | ICD-10-CM

## 2022-06-20 DIAGNOSIS — K219 Gastro-esophageal reflux disease without esophagitis: Secondary | ICD-10-CM

## 2022-06-21 ENCOUNTER — Other Ambulatory Visit (HOSPITAL_BASED_OUTPATIENT_CLINIC_OR_DEPARTMENT_OTHER): Payer: Self-pay | Admitting: Family Medicine

## 2022-06-26 ENCOUNTER — Ambulatory Visit (INDEPENDENT_AMBULATORY_CARE_PROVIDER_SITE_OTHER): Payer: Medicare HMO | Admitting: Family Medicine

## 2022-06-26 ENCOUNTER — Encounter: Payer: Self-pay | Admitting: Family Medicine

## 2022-06-26 VITALS — BP 138/64 | HR 88 | Temp 98.8°F | Resp 18 | Ht 63.0 in | Wt 258.2 lb

## 2022-06-26 DIAGNOSIS — E1165 Type 2 diabetes mellitus with hyperglycemia: Secondary | ICD-10-CM | POA: Diagnosis not present

## 2022-06-26 DIAGNOSIS — M19012 Primary osteoarthritis, left shoulder: Secondary | ICD-10-CM

## 2022-06-26 NOTE — Progress Notes (Signed)
   Established Patient Office Visit  Subjective   Patient ID: [name] [name], female    DOB: 06-05-1940  Age: 82 y.o. MRN: [id number]  Chief Complaint  Patient presents with   abscess    Patient is here today concerned about a possible boil under her left arm, she also has questions regarding her BS reading    HPI  Pt is diabetic. Checking her sugars in the mornings. They are running 90-130s.   Pt went to shoulder specialist for left shoulder pain. She reports she had imaging done. She received an injection in the office. She then broke out from her waist up to her neck. It lasted 5 days. She still has some pain in her shoulder, near her arm pit. She reports the injection she got was in posterior shoulder. This pain is in her axilla. She thought it may be a boil. No palpable boil today.    Review of Systems  Musculoskeletal:  Positive for joint pain.      Objective:     BP 138/64 Comment: 2nd reading  Pulse 88   Temp 98.8 F (37.1 C) (Oral)   Resp 18   Ht 5\' 3"  (1.6 m)   Wt 258 lb 3.2 oz (117.1 kg)   SpO2 97%   BMI 45.74 kg/m    Physical Exam Vitals and nursing note reviewed.  Constitutional:      Appearance: She is obese.  HENT:     Head: Normocephalic and atraumatic.     Mouth/Throat:     Mouth: Mucous membranes are moist.  Eyes:     Pupils: Pupils are equal, round, and reactive to light.  Cardiovascular:     Rate and Rhythm: Normal rate.  Pulmonary:     Effort: Pulmonary effort is normal.  Abdominal:     General: Abdomen is flat. Bowel sounds are normal.  Musculoskeletal:     Comments: Diminished ROM of left shoulder to 90 degrees  Skin:    General: Skin is warm.     Capillary Refill: Capillary refill takes less than 2 seconds.  Neurological:     General: No focal deficit present.     Mental Status: She is alert and oriented to person, place, and time. Mental status is at baseline.  Psychiatric:        Mood and Affect: Mood normal.        Behavior:  Behavior normal.        Thought Content: Thought content normal.        Judgment: Judgment normal.      No results found for any visits on 06/26/22.    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 Sugar readings reviewed today. Recheck at next scheduled visit. Shoulder pain likely OA. Continue follow up with Orthopedist.  No follow-ups on file.    Suzan Slick, MD

## 2022-06-27 ENCOUNTER — Other Ambulatory Visit: Payer: Self-pay | Admitting: Obstetrics and Gynecology

## 2022-06-27 ENCOUNTER — Other Ambulatory Visit (HOSPITAL_BASED_OUTPATIENT_CLINIC_OR_DEPARTMENT_OTHER): Payer: Self-pay | Admitting: Family Medicine

## 2022-06-27 DIAGNOSIS — E1165 Type 2 diabetes mellitus with hyperglycemia: Secondary | ICD-10-CM

## 2022-06-27 DIAGNOSIS — I152 Hypertension secondary to endocrine disorders: Secondary | ICD-10-CM

## 2022-06-27 DIAGNOSIS — E1169 Type 2 diabetes mellitus with other specified complication: Secondary | ICD-10-CM

## 2022-06-27 DIAGNOSIS — I2510 Atherosclerotic heart disease of native coronary artery without angina pectoris: Secondary | ICD-10-CM

## 2022-06-28 ENCOUNTER — Other Ambulatory Visit (HOSPITAL_BASED_OUTPATIENT_CLINIC_OR_DEPARTMENT_OTHER): Payer: Self-pay | Admitting: Family Medicine

## 2022-06-28 DIAGNOSIS — I2510 Atherosclerotic heart disease of native coronary artery without angina pectoris: Secondary | ICD-10-CM

## 2022-06-28 DIAGNOSIS — E1169 Type 2 diabetes mellitus with other specified complication: Secondary | ICD-10-CM

## 2022-06-28 DIAGNOSIS — E1165 Type 2 diabetes mellitus with hyperglycemia: Secondary | ICD-10-CM

## 2022-06-28 DIAGNOSIS — E1159 Type 2 diabetes mellitus with other circulatory complications: Secondary | ICD-10-CM

## 2022-06-28 NOTE — Telephone Encounter (Signed)
Not prescribed in past 6 months

## 2022-06-30 ENCOUNTER — Ambulatory Visit (HOSPITAL_COMMUNITY): Payer: Medicare HMO

## 2022-07-04 ENCOUNTER — Other Ambulatory Visit: Payer: Self-pay

## 2022-07-12 ENCOUNTER — Other Ambulatory Visit (HOSPITAL_BASED_OUTPATIENT_CLINIC_OR_DEPARTMENT_OTHER): Payer: Self-pay | Admitting: Family Medicine

## 2022-07-12 DIAGNOSIS — K219 Gastro-esophageal reflux disease without esophagitis: Secondary | ICD-10-CM

## 2022-07-20 ENCOUNTER — Other Ambulatory Visit (HOSPITAL_BASED_OUTPATIENT_CLINIC_OR_DEPARTMENT_OTHER): Payer: Self-pay | Admitting: Nurse Practitioner

## 2022-07-20 ENCOUNTER — Other Ambulatory Visit (HOSPITAL_BASED_OUTPATIENT_CLINIC_OR_DEPARTMENT_OTHER): Payer: Self-pay | Admitting: Family Medicine

## 2022-07-21 ENCOUNTER — Other Ambulatory Visit (HOSPITAL_BASED_OUTPATIENT_CLINIC_OR_DEPARTMENT_OTHER): Payer: Self-pay | Admitting: Family Medicine

## 2022-07-21 DIAGNOSIS — E1165 Type 2 diabetes mellitus with hyperglycemia: Secondary | ICD-10-CM

## 2022-07-27 ENCOUNTER — Other Ambulatory Visit (HOSPITAL_BASED_OUTPATIENT_CLINIC_OR_DEPARTMENT_OTHER): Payer: Self-pay | Admitting: Family Medicine

## 2022-07-27 DIAGNOSIS — E1165 Type 2 diabetes mellitus with hyperglycemia: Secondary | ICD-10-CM

## 2022-07-30 ENCOUNTER — Encounter: Payer: Self-pay | Admitting: Family Medicine

## 2022-07-31 ENCOUNTER — Other Ambulatory Visit: Payer: Self-pay

## 2022-07-31 DIAGNOSIS — E1165 Type 2 diabetes mellitus with hyperglycemia: Secondary | ICD-10-CM

## 2022-07-31 MED ORDER — DAPAGLIFLOZIN PROPANEDIOL 10 MG PO TABS: 10.0000 mg | ORAL_TABLET | Freq: Every day | ORAL | 1 refills | Status: DC

## 2022-08-01 ENCOUNTER — Ambulatory Visit (HOSPITAL_COMMUNITY): Payer: Medicare HMO | Attending: Cardiovascular Disease

## 2022-08-01 DIAGNOSIS — R011 Cardiac murmur, unspecified: Secondary | ICD-10-CM

## 2022-08-01 LAB — ECHOCARDIOGRAM COMPLETE
AV Mean grad: 8.2 mmHg
AV Peak grad: 17.6 mmHg
Ao pk vel: 2.1 m/s
S' Lateral: 3.4 cm

## 2022-08-15 ENCOUNTER — Telehealth: Payer: Self-pay | Admitting: Internal Medicine

## 2022-08-15 NOTE — Telephone Encounter (Signed)
Patient called requesting to speak with a nurse state she I shaving issue with her bowels and would like to discuss further.

## 2022-08-15 NOTE — Telephone Encounter (Signed)
Spoke with the patient. She is having days with so much diarrhea that she is unable to leave her apartment. "I know I cannot eat certain things like brown gravy and sausage." She is not certain what the triggers are at this time. She has not been on any antibiotics in the past 3 months. She does not have this problem every day. Reports 3 to 5 spells in a week. Patient was seen for this in January 2024 and at that time she was improving. She is due follow up. Will try to get her scheduled in the office.

## 2022-08-16 NOTE — Telephone Encounter (Signed)
Appointment 08/23/22 at 1:30 pm with Hyacinth Meeker, PA. Patient accepts this appointment.

## 2022-08-20 ENCOUNTER — Other Ambulatory Visit (HOSPITAL_BASED_OUTPATIENT_CLINIC_OR_DEPARTMENT_OTHER): Payer: Self-pay | Admitting: Nurse Practitioner

## 2022-08-20 DIAGNOSIS — D649 Anemia, unspecified: Secondary | ICD-10-CM

## 2022-08-21 ENCOUNTER — Other Ambulatory Visit: Payer: Self-pay | Admitting: Obstetrics and Gynecology

## 2022-08-21 DIAGNOSIS — N390 Urinary tract infection, site not specified: Secondary | ICD-10-CM

## 2022-08-22 ENCOUNTER — Telehealth: Payer: Self-pay | Admitting: Family Medicine

## 2022-08-22 NOTE — Telephone Encounter (Signed)
Patient called in to state that she had a pedicure down and her legs are now swollen and hard. Patient advised to go to an urgent care, as PCP is not in office and clinic closes shortly. Patient declined and demanded to see PCP tomorrow. Patient informed PCP will not return to office until 07/22. Patient hung up. Katha Hamming

## 2022-08-23 ENCOUNTER — Ambulatory Visit (INDEPENDENT_AMBULATORY_CARE_PROVIDER_SITE_OTHER): Payer: Medicare HMO | Admitting: Physician Assistant

## 2022-08-23 ENCOUNTER — Encounter: Payer: Self-pay | Admitting: Physician Assistant

## 2022-08-23 VITALS — BP 124/68 | HR 80 | Ht 63.0 in | Wt 258.4 lb

## 2022-08-23 DIAGNOSIS — R197 Diarrhea, unspecified: Secondary | ICD-10-CM | POA: Diagnosis not present

## 2022-08-23 MED ORDER — CHOLESTYRAMINE 4 G PO PACK: 4.0000 g | PACK | Freq: Two times a day (BID) | ORAL | 5 refills | Status: DC

## 2022-08-23 NOTE — Progress Notes (Signed)
Chief Complaint: Follow up diarrhea  HPI:  [name] [name] is an 82 y/o with a past medical history as listed below including COPD, diabetes, GERD and status postcholecystectomy, known to Dr. Leonides Schanz, who presents to clinic today for follow-up of her diarrhea.    10/18/2021 patient initially seen in clinic by Dr. Leonides Schanz for diarrhea.  At that time discussed that she felt that her liquid stools were due to certain things she was eating.  Described a colonoscopy 2 to 3 years prior which was normal.  At that time patient was instructed on a low FODMAP diet and told to try daily Metamucil as well as continue Imodium 2 mg 3 times daily as needed.    02/08/2022 patient followed up in clinic for diarrhea and at that time doing better with a change in her diet.  She was continued on low FODMAP and told to trial daily Metamucil and continue Imodium 2 mg 3 times daily as needed.    Today, patient tells me that she has had a few episodes of terrible diarrhea that she would say was almost "dysentery", with multiple liquid stools throughout the day which made it so that she could not even leave her room.  This has happened maybe 5-6 times and also continues to have issues with normal diarrhea regardless of using Imodium 1 tab daily.  Patient does sometimes add another Imodium if she has a bad day.  Still thinks this is related mostly to her diet, apparently dark gravy is bad for her but she can tolerate chicken gravy and she is lactose intolerant.  But even with avoiding all of these things she still has diarrhea and is unsure what is triggering it.  Today is having a right lower quadrant discomfort after eating spaghetti for lunch.  Does not normally have this pain.  Does describe that in between these episodes of diarrhea she is constipated with hard to pass hard stools maybe every other day.    Does have history of prior cholecystectomy.  Previously worked as a Transport planner.    Denies fever, chills,  weight loss or blood in her stool.  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   Cataract    Closed fracture of upper end of humerus 11/03/2017   COPD (chronic obstructive pulmonary disease) (HCC)    Depression    Diabetes 1.5, managed as type 2 (HCC)    Encounter to establish care 06/03/2020   Essential hypertension 05/19/2019   Female stress incontinence 02/14/2018   GERD (gastroesophageal reflux disease)    History of opioid abuse (HCC) 05/19/2019   Hyperglycemia 05/19/2019   Hyperlipidemia    Hypertension    Menopause present 02/11/2018   Need for shingles vaccine 07/01/2020   Osteoporosis    Sleep apnea    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 18 g 1   ALPRAZolam (XANAX) 0.25 MG tablet TAKE (1) TABLET BY MOUTH TWICE DAILY AT 11AM & 5PM FOR ANXIETY. (Patient taking differently: Take 0.25 mg by mouth 2 (two) times daily. 1100 and 1500 And an extra 0.25mg  oral twice daily as needed for anxiety) 60 tablet 0   AMBULATORY NON FORMULARY MEDICATION One four wheel rolling walker with seat and hand brakes. Brand/style covered by insurance. Medically necessary for ICD-10: R26.81, Z74.1 1 Units 0   amLODIPine-Valsartan-HCTZ 10-320-25 MG TABS TAKE 1 TABLET BY MOUTH EVERY DAY 90 tablet 1    ARTHRITIS PAIN RELIEVING 0.075 % topical cream Apply 1 application topically 3 (three) times daily as needed (neuropathic pain in feet).     atorvastatin (LIPITOR) 10 MG tablet TAKE 1 TABLET BY MOUTH EVERY DAY 90 tablet 1   Bacillus Coagulans-Inulin (PROBIOTIC FORMULA) 1-250 BILLION-MG CAPS TAKE 1 CAPSULE (250MG ) BY MOUTH 2 TIMES A DAY. 60 capsule 11   blood glucose meter kit and supplies KIT Meter, test strips, lancets, and alcohol swabs. Use for testing blood sugar every morning before meals and up to 3 additional times per day as needed. Brand based on patient and insurance coverage. 100 strips with 99 refills, 100 lancets with 99 refills, 200 alcohol swabs with 99 refills. Dx: E11.65 1 each 99   camphor-menthol (SARNA) lotion Apply 1 application topically as needed for itching. (Patient taking differently: Apply 1 application  topically 2 (two) times daily as needed for itching.) 222 mL 0   carvedilol (COREG) 6.25 MG tablet TAKE 1 TABLET BY MOUTH 2 TIMES DAILY WITH A MEAL. 180 tablet 1   cetirizine (ZYRTEC) 10 MG tablet TAKE 1 TABLET BY MOUTH EVERY DAY 90 tablet 3   cloNIDine (CATAPRES) 0.1 MG tablet Take 1 tablet (0.1 mg total) by mouth 3 (three) times daily as needed (systolic blood pressurre above 190). 15 tablet 0   colchicine-probenecid 0.5-500 MG tablet Take 1 tablet by mouth daily. 90 tablet 1   dapagliflozin propanediol (FARXIGA) 10 MG TABS tablet Take 1 tablet (10 mg total) by mouth daily before breakfast. 90 tablet 1   diphenhydrAMINE (BENADRYL) 25 mg capsule Take 1 capsule (25 mg total) by mouth every 8 (eight) hours as needed for itching or allergies. 30 capsule 1   EASYMAX TEST test strip      famotidine (PEPCID) 20 MG tablet Take 1 tablet (20 mg total) by mouth daily as needed for heartburn or indigestion. 90 tablet 3   FEROSUL 325 (65 Fe) MG tablet TAKE (1) TABLET BY MOUTH ONCE DAILY. 30 tablet 10   Fluticasone-Umeclidin-Vilant (TRELEGY ELLIPTA) 100-62.5-25 MCG/ACT AEPB Inhale 1 puff  into the lungs daily. ** RINSE MOUTH AFTER USE** 60 each 5   guaiFENesin (MUCINEX) 600 MG 12 hr tablet Take 2 tablets (1,200 mg total) by mouth 2 (two) times daily as needed for to loosen phlegm or cough. Use for 7 days then as needed if cough is improving. 30 tablet 1   hydrALAZINE (APRESOLINE) 25 MG tablet Take 1 tablet (25 mg total) by mouth 2 (two) times daily. 60 tablet 10   Lactase 9000 units TABS Take one tablet with the first bite of meals containing milk or dairy products. (Patient taking differently: Take 9,000 Units by mouth See admin instructions. Take 9000units with the first bite of meals containing milk or dairy products.) 90 tablet 11  mirabegron ER (MYRBETRIQ) 25 MG TB24 tablet Take 1 tablet (25 mg total) by mouth daily. 30 tablet 11   pantoprazole (PROTONIX) 40 MG tablet TAKE 1 TABLET BY MOUTH EVERY DAY 90 tablet 1   PARoxetine (PAXIL) 40 MG tablet Take 40 mg by mouth daily.     psyllium (METAMUCIL) 58.6 % packet Take 1 packet by mouth daily. 30 each 12   YUVAFEM 10 MCG TABS vaginal tablet PLACE 1 TABLET VAGINALLY TWICE A WEEK 24 tablet 2   No current facility-administered medications for this visit.    Allergies as of 08/23/2022 - Review Complete 08/23/2022  Allergen Reaction Noted   Azithromycin Anaphylaxis and Hives 05/19/2019   Bee venom Anaphylaxis 03/09/2020   Erythromycin base Anaphylaxis 03/09/2020   Metronidazole Anaphylaxis 05/19/2019   Nitrofurantoin macrocrystal Anaphylaxis 03/09/2020   Penicillins Anaphylaxis and Hives 05/19/2019   Sulfa antibiotics Anaphylaxis and Hives 05/19/2019   Cymbalta [duloxetine hcl]  12/17/2019   Ivp dye [iodinated contrast media]  12/17/2019   Penicillin g Hives 06/03/2020   Codeine Rash 12/17/2019   Erythromycin Rash and Hives 05/19/2019   Fosfomycin Rash 12/24/2020   Levofloxacin Rash 12/17/2019   Nitrofurantoin Hives 05/19/2019   Percocet [oxycodone-acetaminophen] Rash 12/17/2019    Family History  Problem Relation Age  of Onset   Cancer Mother    Intellectual disability Mother    Cancer Father    Early death Father    Hypertension Father    Cancer Sister    Arthritis Maternal Grandmother    Asthma Paternal Grandmother    Asthma Paternal Grandfather    Colon cancer Neg Hx    Stomach cancer Neg Hx    Esophageal cancer Neg Hx    Colon polyps Neg Hx     Social History   Socioeconomic History   Marital status: Widowed    Spouse name: Not on file   Number of children: 3   Years of education: Not on file   Highest education level: Not on file  Occupational History   Occupation: Retired  Tobacco Use   Smoking status: Former    Current packs/day: 0.00    Types: Cigarettes    Start date: 1957    Quit date: 1988    Years since quitting: 36.5   Smokeless tobacco: Never  Vaping Use   Vaping status: Never Used  Substance and Sexual Activity   Alcohol use: Not Currently   Drug use: Never   Sexual activity: Not Currently  Other Topics Concern   Not on file  Social History Narrative   Not on file   Social Determinants of Health   Financial Resource Strain: Low Risk  (03/28/2022)   Overall Financial Resource Strain (CARDIA)    Difficulty of Paying Living Expenses: Not hard at all  Food Insecurity: No Food Insecurity (03/28/2022)   Hunger Vital Sign    Worried About Running Out of Food in the Last Year: Never true    Ran Out of Food in the Last Year: Never true  Transportation Needs: No Transportation Needs (03/28/2022)   PRAPARE - Administrator, Civil Service (Medical): No    Lack of Transportation (Non-Medical): No  Physical Activity: Inactive (03/28/2022)   Exercise Vital Sign    Days of Exercise per Week: 0 days    Minutes of Exercise per Session: 0 min  Stress: No Stress Concern Present (03/28/2022)   [employer] of Occupational Health - Occupational Stress Questionnaire    Feeling of Stress : Not  at all  Social Connections: Moderately Isolated (03/28/2022)   Social  Connection and Isolation Panel [NHANES]    Frequency of Communication with Friends and Family: More than three times a week    Frequency of Social Gatherings with Friends and Family: Never    Attends Religious Services: Never    Database administrator or Organizations: No    Attends Engineer, structural: More than 4 times per year    Marital Status: Widowed  Intimate Partner Violence: Not At Risk (03/28/2022)   Humiliation, Afraid, Rape, and Kick questionnaire    Fear of Current or Ex-Partner: No    Emotionally Abused: No    Physically Abused: No    Sexually Abused: No    Review of Systems:    Constitutional: No weight loss, fever or chills Cardiovascular: No chest pain  Respiratory: No SOB  Gastrointestinal: See HPI and otherwise negative   Physical Exam:  Vital signs: BP 124/68   Pulse 80   Ht 5\' 3"  (1.6 m)   Wt 258 lb 6.4 oz (117.2 kg)   BMI 45.77 kg/m    Constitutional:   Pleasant overweight Caucasian female appears to be in NAD, Well developed, Well nourished, alert and cooperative Respiratory: Respirations even and unlabored. Lungs clear to auscultation bilaterally.   No wheezes, crackles, or rhonchi.  Cardiovascular: Normal S1, S2. No MRG. Regular rate and rhythm. No peripheral edema, cyanosis or pallor.  Gastrointestinal:  Soft, nondistended, nontender. No rebound or guarding. Normal bowel sounds. No appreciable masses or hepatomegaly. Rectal:  Not performed.  Psychiatric: Demonstrates good judgement and reason without abnormal affect or behaviors.  RELEVANT LABS AND IMAGING: CBC    Component Value Date/Time   WBC 8.7 07/05/2021 1021   WBC 11.8 (H) 03/13/2021 0221   RBC 5.01 07/05/2021 1021   RBC 4.12 03/13/2021 0221   HGB 12.8 07/05/2021 1021   HCT 41.0 07/05/2021 1021   PLT 275 07/05/2021 1021   MCV 82 07/05/2021 1021   MCH 25.5 (L) 07/05/2021 1021   MCH 19.9 (L) 03/13/2021 0221   MCHC 31.2 (L) 07/05/2021 1021   MCHC 28.8 (L) 03/13/2021 0221    RDW 16.3 (H) 07/05/2021 1021   LYMPHSABS 1.3 07/05/2021 1021   MONOABS 0.6 03/13/2021 0221   EOSABS 0.2 07/05/2021 1021   BASOSABS 0.0 07/05/2021 1021    CMP     Component Value Date/Time   NA 142 11/01/2021 1126   K 4.5 11/01/2021 1126   CL 105 11/01/2021 1126   CO2 22 11/01/2021 1126   GLUCOSE 126 (H) 11/01/2021 1126   GLUCOSE 104 (H) 04/30/2021 1148   BUN 14 11/01/2021 1126   CREATININE 0.99 11/01/2021 1126   CALCIUM 9.6 11/01/2021 1126   PROT 7.3 11/01/2021 1126   ALBUMIN 4.4 11/01/2021 1126   AST 21 11/01/2021 1126   ALT 16 11/01/2021 1126   ALKPHOS 130 (H) 11/01/2021 1126   BILITOT 0.4 11/01/2021 1126   GFRNONAA >60 04/30/2021 1148   GFRAA 69 12/29/2019 1434    Assessment: 1.  Diarrhea: Previously helped by a change to a low FODMAP diet and Imodium 2 mg daily, now with multiple episodes of breakthrough symptoms, prior history of cholecystectomy, in between the symptoms with constipation and hard stools; consider bile salt induced diarrhea versus possibly constipation with overflow  Plan: 1.  At this time we will trial Cholestyramine 4 g packet twice daily, #60 with 5 refills. 2.  Discussed titration of Cholestyramine, she can decrease to 1  packet a day if she starts being constipated, but will first would stop her Imodium. 3.  Pending results from above could consider imaging including an x-ray to rule out overflow constipation 4.  Patient to follow in clinic with me in 2 to 3 months.  Her next appointment was scheduled with Dr. Leonides Schanz in about a month, but discussed that that is too soon from now.  She will follow with me instead.  Hyacinth Meeker, PA-C [facility] 08/23/2022, 1:54 PM  Cc: Suzan Slick, MD

## 2022-08-23 NOTE — Patient Instructions (Addendum)
We have sent the following medications to your pharmacy for you to pick up at your convenience: Cholestyramine 4 mg.  If starts with constipation, stop Imodium first.  You have been scheduled for an appointment with Hyacinth Meeker on 11/09/22 at 2:30 PM . Please arrive 10 minutes early for your appointment.  _______________________________________________________  If your blood pressure at your visit was 140/90 or greater, please contact your primary care physician to follow up on this.  _______________________________________________________  If you are age 82 or older, your body mass index should be between 23-30. Your Body mass index is 45.77 kg/m. If this is out of the aforementioned range listed, please consider follow up with your Primary Care Provider. __________________________________________________________  The [facility] providers would like to encourage you to use [facility] to communicate with providers for non-urgent requests or questions.  Due to long hold times on the telephone, sending your provider a message by [facility] may be a faster and more efficient way to get a response.  Please allow 48 business hours for a response.  Please remember that this is for non-urgent requests.   Due to recent changes in healthcare laws, you may see the results of your imaging and laboratory studies on MyChart before your provider has had a chance to review them.  We understand that in some cases there may be results that are confusing or concerning to you. Not all laboratory results come back in the same time frame and the provider may be waiting for multiple results in order to interpret others.  Please give [place] 48 hours in order for your provider to thoroughly review all the results before contacting the office for clarification of your results.     Thank you for choosing me and [facility].  Hyacinth Meeker, PA-C

## 2022-08-23 NOTE — Progress Notes (Signed)
I agree with the assessment and plan as outlined by Ms. Lemmon. 

## 2022-08-24 ENCOUNTER — Telehealth: Payer: Self-pay

## 2022-08-24 NOTE — Telephone Encounter (Signed)
Spoke with patient and she stated that her pharmacy said that they did not have record of her having Prescriptions sent to them from [place], She stated that she was told from the pharmacy that we had to send them something, and she stated we told her they had to send [place] something. I told her that I never spoke with her regarding her pharmacy and I never told her that. I then went into her chart and gave her  dates of her visits and the dates of when prescriptions were sent . [name] [name] then stated her son told her that she needed to refill her Marcelline Deist, I explained to her that her Prescription for that medication was filled on 07/31/2022 with a quantity of 90. Her refill should not be due again until around September. Patient stated that she understood and I advised her if she needing anything else to give [place] a call. Patient gave a verbal understanding

## 2022-08-30 ENCOUNTER — Other Ambulatory Visit: Payer: Self-pay | Admitting: Pulmonary Disease

## 2022-08-30 NOTE — Telephone Encounter (Signed)
Spoke with patient and she said her legs were doing better, she said she did not have to go to an Urgent Care for her legs the swelling has gone down.

## 2022-09-07 ENCOUNTER — Inpatient Hospital Stay: Admission: RE | Admit: 2022-09-07 | Payer: Medicare HMO | Source: Ambulatory Visit

## 2022-09-17 ENCOUNTER — Other Ambulatory Visit (HOSPITAL_BASED_OUTPATIENT_CLINIC_OR_DEPARTMENT_OTHER): Payer: Self-pay | Admitting: Family Medicine

## 2022-09-18 ENCOUNTER — Telehealth: Payer: Self-pay | Admitting: Family Medicine

## 2022-09-18 NOTE — Telephone Encounter (Signed)
Patient is calling stating that she has swelling in both legs and that she would like to come in and get an evaluation done on both legs. Advised patient to make an appointment when she has available time to come in. Patient agreed and stated that she would.

## 2022-09-18 NOTE — Telephone Encounter (Signed)
Patient calling in to request a prescription for fluid retention. Patient states she is retaining fluid in her legs. Inquired with patient about scheduling an appointment. Patient states the soonest she could come in is Friday, maybe not until next week. Patient requested to speak to provider or nurse. Placed patient on hold and sent message to nurse requesting transfer. After 10 minutes of holding, notified patient a note has been put in and she will received a call back. Please advise at [phone]. Katha Hamming

## 2022-09-21 ENCOUNTER — Other Ambulatory Visit: Payer: Self-pay | Admitting: Physician Assistant

## 2022-09-22 ENCOUNTER — Telehealth: Payer: Self-pay | Admitting: Physician Assistant

## 2022-09-22 NOTE — Telephone Encounter (Signed)
Inbound call from patient states she is having constipation. Lease advise.

## 2022-09-22 NOTE — Telephone Encounter (Signed)
Lm on vm for patient to return call 

## 2022-09-25 ENCOUNTER — Other Ambulatory Visit (HOSPITAL_BASED_OUTPATIENT_CLINIC_OR_DEPARTMENT_OTHER): Payer: Self-pay | Admitting: Family Medicine

## 2022-09-25 ENCOUNTER — Telehealth: Payer: Self-pay | Admitting: Pulmonary Disease

## 2022-09-25 DIAGNOSIS — E1159 Type 2 diabetes mellitus with other circulatory complications: Secondary | ICD-10-CM

## 2022-09-25 DIAGNOSIS — E1169 Type 2 diabetes mellitus with other specified complication: Secondary | ICD-10-CM

## 2022-09-25 DIAGNOSIS — E1165 Type 2 diabetes mellitus with hyperglycemia: Secondary | ICD-10-CM

## 2022-09-25 DIAGNOSIS — I2510 Atherosclerotic heart disease of native coronary artery without angina pectoris: Secondary | ICD-10-CM

## 2022-09-25 DIAGNOSIS — E785 Hyperlipidemia, unspecified: Secondary | ICD-10-CM

## 2022-09-25 NOTE — Telephone Encounter (Signed)
Pt states she got her equipment in the mail her oral device and she wants to speak w Dr. Tonia Brooms to go over other places she was looking at  [facility]  [facility]

## 2022-09-25 NOTE — Telephone Encounter (Signed)
Patient is returning your call.  

## 2022-09-26 NOTE — Telephone Encounter (Signed)
Lm on vm for patient to return call 

## 2022-09-27 NOTE — Telephone Encounter (Signed)
Patient is returning your call. States the Questran medication helped with the diarrhea but now she is experiencing some constipation. Seeking further advise.

## 2022-09-27 NOTE — Telephone Encounter (Signed)
Returned call to patient. Pt reports that she is now constipated on Cholestyramine BID. Pt has been advised to decrease dose to once a day for a week, if this regimen works she can continue on daily dose. If pt continues to have constipation on once a day dose she has been advised to hold Cholestyramine for a few days or until she is able to have a bowel movement. Pt verbalized understanding and had no concerns at the end of the call.

## 2022-09-27 NOTE — Telephone Encounter (Signed)
Pt wants to switch her cpap for a oral device

## 2022-09-28 ENCOUNTER — Encounter: Payer: Self-pay | Admitting: Pulmonary Disease

## 2022-10-03 ENCOUNTER — Ambulatory Visit: Payer: Medicare HMO | Admitting: Internal Medicine

## 2022-10-05 NOTE — Telephone Encounter (Signed)
Patient has been scheduled and told to bring her CPAP machine

## 2022-10-05 NOTE — Telephone Encounter (Signed)
Patient states that she already started this process with another provider. She is stating that she uses her CPAP every night. She is trying to get an acute appt for cough x couple weeks. Message sent to [facility] to have her call and schedule her.

## 2022-10-05 NOTE — Telephone Encounter (Signed)
Will need OV for OSA , I am glad to see at my next open  office visit . Last seen for OSA in 2022, is she still on CPAP will need to bring in machine , need download

## 2022-10-11 ENCOUNTER — Ambulatory Visit: Payer: Medicare HMO | Admitting: Family Medicine

## 2022-10-12 ENCOUNTER — Ambulatory Visit: Payer: Medicare HMO | Admitting: Family Medicine

## 2022-10-18 ENCOUNTER — Encounter: Payer: Self-pay | Admitting: Family Medicine

## 2022-10-18 ENCOUNTER — Ambulatory Visit (INDEPENDENT_AMBULATORY_CARE_PROVIDER_SITE_OTHER): Payer: Medicare HMO | Admitting: Family Medicine

## 2022-10-18 VITALS — BP 138/57 | HR 72 | Temp 98.0°F | Resp 18 | Ht 63.0 in | Wt 268.3 lb

## 2022-10-18 DIAGNOSIS — R2232 Localized swelling, mass and lump, left upper limb: Secondary | ICD-10-CM | POA: Diagnosis not present

## 2022-10-18 DIAGNOSIS — E1165 Type 2 diabetes mellitus with hyperglycemia: Secondary | ICD-10-CM

## 2022-10-18 NOTE — Progress Notes (Signed)
Established Patient Office Visit  Subjective   Patient ID: [name] [name], female    DOB: 31-May-1940  Age: 82 y.o. MRN: [id number]  No chief complaint on file.   HPI  Diabetes Pt taking Farxiga 10mg  daily for blood sugar. She is taking an ARB and statin. She is up to date with diabetic eye exam.  Lump  Pt reports pain in her left arm pit for many months. She says she has felt a lump there recently. She notices pain with certain movements. She says she has mentioned this for quite some time but has always been told there was nothing there. Painful to touch.   Review of Systems  Skin:        Left axilla lump  All other systems reviewed and are negative.    Objective:     There were no vitals taken for this visit. BP Readings from Last 3 Encounters:  10/18/22 (!) 138/57  08/23/22 124/68  06/26/22 138/64      Physical Exam Vitals and nursing note reviewed.  Constitutional:      Appearance: Normal appearance. She is normal weight.  HENT:     Head: Normocephalic and atraumatic.     Right Ear: External ear normal.     Left Ear: External ear normal.     Nose: Nose normal.     Mouth/Throat:     Mouth: Mucous membranes are moist.     Pharynx: Oropharynx is clear.  Eyes:     Conjunctiva/sclera: Conjunctivae normal.     Pupils: Pupils are equal, round, and reactive to light.  Cardiovascular:     Rate and Rhythm: Normal rate.  Pulmonary:     Effort: Pulmonary effort is normal.  Abdominal:     General: Abdomen is flat. Bowel sounds are normal.  Skin:    General: Skin is warm.     Capillary Refill: Capillary refill takes less than 2 seconds.     Comments: Soft tissue lump felt with deep palpation to left axilla measuring 2x 3 cm  Neurological:     General: No focal deficit present.     Mental Status: She is alert and oriented to person, place, and time. Mental status is at baseline.  Psychiatric:        Mood and Affect: Mood normal.        Behavior: Behavior  normal.        Thought Content: Thought content normal.        Judgment: Judgment normal.    No results found for any visits on 10/18/22.  Last CBC Lab Results  Component Value Date   WBC 8.7 07/05/2021   HGB 12.8 07/05/2021   HCT 41.0 07/05/2021   MCV 82 07/05/2021   MCH 25.5 (L) 07/05/2021   RDW 16.3 (H) 07/05/2021   PLT 275 07/05/2021   Last metabolic panel Lab Results  Component Value Date   GLUCOSE 126 (H) 11/01/2021   NA 142 11/01/2021   K 4.5 11/01/2021   CL 105 11/01/2021   CO2 22 11/01/2021   BUN 14 11/01/2021   CREATININE 0.99 11/01/2021   EGFR 58 (L) 11/01/2021   CALCIUM 9.6 11/01/2021   PROT 7.3 11/01/2021   ALBUMIN 4.4 11/01/2021   LABGLOB 2.9 11/01/2021   AGRATIO 1.5 11/01/2021   BILITOT 0.4 11/01/2021   ALKPHOS 130 (H) 11/01/2021   AST 21 11/01/2021   ALT 16 11/01/2021   ANIONGAP 7 04/30/2021   Last lipids Lab Results  Component Value Date  CHOL 122 07/05/2021   HDL 39 (L) 07/05/2021   LDLCALC 50 07/05/2021   TRIG 207 (H) 07/05/2021   CHOLHDL 3.1 07/05/2021   Last hemoglobin A1c Lab Results  Component Value Date   HGBA1C 5.9 (A) 04/10/2022      The ASCVD Risk score (Arnett DK, et al., 2019) failed to calculate for the following reasons:   The 2019 ASCVD risk score is only valid for ages 60 to 11    Assessment & Plan:   Problem List Items Addressed This Visit       Endocrine   Type 2 diabetes mellitus with hyperglycemia, without long-term current use of insulin (HCC) - Primary  Type 2 diabetes mellitus with hyperglycemia, without long-term current use of insulin (HCC) -     Hemoglobin A1c -     Comprehensive metabolic panel -     Microalbumin / creatinine urine ratio  Mass of left axilla -     MS [place] AXILLA LEFT; Future   To get recheck A1c and cmp/Urine micro.  To follow up with pt on results. Lump to left axilla palpable on exam today. Send for ultrasound for further evaluation.   No follow-ups on file.    Suzan Slick, MD

## 2022-10-19 ENCOUNTER — Other Ambulatory Visit: Payer: Self-pay | Admitting: Family Medicine

## 2022-10-19 DIAGNOSIS — R2232 Localized swelling, mass and lump, left upper limb: Secondary | ICD-10-CM

## 2022-10-19 LAB — COMPREHENSIVE METABOLIC PANEL
ALT: 18 IU/L (ref 0–32)
AST: 25 IU/L (ref 0–40)
Albumin: 4 g/dL (ref 3.7–4.7)
Alkaline Phosphatase: 119 IU/L (ref 44–121)
BUN/Creatinine Ratio: 23 (ref 12–28)
BUN: 31 mg/dL — ABNORMAL HIGH (ref 8–27)
Bilirubin Total: 0.4 mg/dL (ref 0.0–1.2)
CO2: 21 mmol/L (ref 20–29)
Calcium: 9.3 mg/dL (ref 8.7–10.3)
Chloride: 106 mmol/L (ref 96–106)
Creatinine, Ser: 1.34 mg/dL — ABNORMAL HIGH (ref 0.57–1.00)
Globulin, Total: 2.7 g/dL (ref 1.5–4.5)
Glucose: 144 mg/dL — ABNORMAL HIGH (ref 70–99)
Potassium: 4.2 mmol/L (ref 3.5–5.2)
Sodium: 144 mmol/L (ref 134–144)
Total Protein: 6.7 g/dL (ref 6.0–8.5)
eGFR: 40 mL/min/{1.73_m2} — ABNORMAL LOW (ref >59)

## 2022-10-19 LAB — HEMOGLOBIN A1C
Est. average glucose Bld gHb Est-mCnc: 131 mg/dL
Hgb A1c MFr Bld: 6.2 % — ABNORMAL HIGH (ref 4.8–5.6)

## 2022-10-26 ENCOUNTER — Telehealth: Payer: Self-pay | Admitting: Family Medicine

## 2022-10-26 NOTE — Telephone Encounter (Signed)
Patient calling upset and saying we have sent her [place] order to [place]. Explained to patient that we have not sent her to [place] for imaging but patient requesting clarity. Called imaging at [phone] for clarity. Imaging states they will not schedule patient for imaging until all previous records from [place] are obtained. Attempted to return patient call to inform her. Unable to leave voicemail.   Next steps for patient per imaging are to acquire previous imaging records from [place]. Records must be submitted to imaging. Once records are reviewed, radiologist and imaging location will schedule her.  MAJ

## 2022-10-30 ENCOUNTER — Telehealth: Payer: Self-pay | Admitting: Family Medicine

## 2022-10-30 NOTE — Telephone Encounter (Signed)
Erroneous encounter -MAJ

## 2022-10-30 NOTE — Telephone Encounter (Signed)
Spoke with Patients son explained to him the process and was instructed by her son to contact patient. Spoke with Ms. Allensworth and explained to her as to why the release needed to be signed. Patient states that she has signed the form and I informed her that the imaging center will contact her with scheduling information

## 2022-10-30 NOTE — Telephone Encounter (Signed)
I have forwarded this message to [facility] Imaging center to see if they can go ahead and perform this test. Please inform pt of this as all imaging departments have the same protocol regarding prior imaging so they can compare it to. They are awaiting records from [place] so they can perform this test and have a prior image to compare to the new results.

## 2022-10-30 NOTE — Telephone Encounter (Signed)
Patient calling in about [place] of underarm again. Notified patient that per prior phone call and discussion, radiologist will not schedule patient until records have been obtained.   Patient expressed dissatisfaction. Patient stated she has mailed a form of some sort to our practice and does not have the information regarding the previous imaging reports done in [place] anymore. Patient states form will be arriving to [place].   Patient inquired about sending imaging for under-arm to a differing location. Please advise. [name] [name]

## 2022-11-09 ENCOUNTER — Ambulatory Visit: Payer: Medicare HMO | Admitting: Physician Assistant

## 2022-11-13 ENCOUNTER — Ambulatory Visit: Payer: Medicare HMO | Admitting: Adult Health

## 2022-11-15 ENCOUNTER — Ambulatory Visit
Admission: RE | Admit: 2022-11-15 | Discharge: 2022-11-15 | Disposition: A | Discharge status: Home / Self Care | Payer: Medicare HMO | Source: Ambulatory Visit | Attending: Family Medicine | Admitting: Family Medicine

## 2022-11-15 ENCOUNTER — Ambulatory Visit
Admission: RE | Admit: 2022-11-15 | Discharge: 2022-11-15 | Disposition: A | Discharge status: Home / Self Care | Payer: Medicare HMO | Source: Ambulatory Visit | Attending: Family Medicine

## 2022-11-15 DIAGNOSIS — R2232 Localized swelling, mass and lump, left upper limb: Secondary | ICD-10-CM

## 2022-11-17 ENCOUNTER — Telehealth: Payer: Medicare HMO | Admitting: Emergency Medicine

## 2022-11-17 ENCOUNTER — Telehealth: Payer: Self-pay | Admitting: Family Medicine

## 2022-11-17 ENCOUNTER — Telehealth: Payer: Medicare HMO | Admitting: Family Medicine

## 2022-11-17 DIAGNOSIS — U071 COVID-19: Secondary | ICD-10-CM

## 2022-11-17 NOTE — Telephone Encounter (Signed)
Patient's son called in to request the same information as patient. Walked patient through available options including E-Visits, [place] when PCP returns, or visiting Urgent Care as needed. Katha Hamming

## 2022-11-17 NOTE — Progress Notes (Signed)
Hello  Mindi Junker,   I agree you should have medication for covid. Unfortunately, I am not able to prescribe them through Evisit. You will  need a Video Virtual Urgent Care visit to get a prescription.   If you change your visit to a video visit, we will bill your insurance (similar to an office visit) and you will not be charged for this e-Visit. You will be able to stay at home and speak with the first available [facility] advanced practice provider. The link to do a video visit is in the drop down Menu tab of your Welcome screen in MyChart.    Or you can go through the [facility] Virtual Care website: [email or web address]   Kind regards,  Rica Mast, PhD, FNP-BC [facility]

## 2022-11-17 NOTE — Telephone Encounter (Signed)
Patient calling in to state she has COVID and wants to speak to Dr. Wyline Mood over the phone. Informed patient that PCP is out of office and will return on Monday. Patient asked to speak to 'the other doctor', informed patient that she is in clinic right now and that I can take a message or schedule any appointments. Patient would like medicines of Dr. Verna Czech recommendation prescribed to her and to speak with provider over the phone for COVID. Informed patient that provider cannot take call because she is in clinic and that, as there are no appointment slots available to schedule an appointment, our best recommendations include accessing telehealth through MyChart, scheduling with PCP on return at next available, or visiting Urgent Care. Patient expressed her anger and dissatisfaction that her PCP is out of office and that we would not speak to her over the phone for 'an emergency'. Patient hung up. Katha Hamming

## 2022-11-17 NOTE — Progress Notes (Signed)
Pt did not show for visit. I tried calling her also and left her a VM. I called the second number on her chart and it was her son who confirmed her phone number. No return calls at my number.

## 2022-11-20 ENCOUNTER — Telehealth: Payer: Self-pay

## 2022-11-20 NOTE — Telephone Encounter (Signed)
-----   Message from Suzan Slick sent at 11/20/2022  9:10 AM EDT ----- Please inform pt her mammogram and ultrasound was normal. It didn't show any concerning findings regarding the lump. They advised it was most like a fat pad which is an area of closely packed fat cells under the skin.

## 2022-11-20 NOTE — Telephone Encounter (Signed)
Spoke with patient and patient is aware of results from mammogram and ultrasound .

## 2022-11-20 NOTE — Telephone Encounter (Signed)
Glad she is feeling better. She doesn't need to take anything if she's feeling better.

## 2022-11-20 NOTE — Telephone Encounter (Signed)
Spoke with patient and informed her of what provider said.

## 2022-11-20 NOTE — Telephone Encounter (Signed)
Patient called to inform us that she tested positive on Thursday of last week for Covid, she states that she has been quarantined , since the positive result. She states that she is feeling better and wants to know if there is anything else she would need to do or any other medications she would need to take.  Please advise

## 2022-11-21 ENCOUNTER — Other Ambulatory Visit (HOSPITAL_BASED_OUTPATIENT_CLINIC_OR_DEPARTMENT_OTHER): Payer: Self-pay | Admitting: Family Medicine

## 2022-11-29 ENCOUNTER — Telehealth: Payer: Self-pay | Admitting: Family Medicine

## 2022-11-29 MED ORDER — ALBUTEROL SULFATE HFA 108 (90 BASE) MCG/ACT IN AERS: 1.0000 | INHALATION_SPRAY | RESPIRATORY_TRACT | 1 refills | Status: DC | PRN

## 2022-11-29 NOTE — Telephone Encounter (Signed)
Refilled Albuterol inhaler

## 2022-11-29 NOTE — Telephone Encounter (Signed)
Prescription Request  11/29/2022  LOV: 10/18/2022  What is the name of the medication or equipment?  albuterol (VENTOLIN HFA) 108 (90 Base) MCG/ACT inhaler Patient states she needs a new prescription bit does not know the reason why? She states she had one but it's about to run out but does not know who prescribed it so is requesting a refill.  Have you contacted your pharmacy to request a refill? Yes   Which pharmacy would you like this sent to?  [id number] [place], [place] - [address] [place] [address] Phone: [phone] Fax: [phone]    Patient notified that their request is being sent to the clinical staff for review and that they should receive a response within 2 business days.   Please advise at Mobile [phone] (mobile)   Patient calling and requesting handouts be provided regarding usage of her T2 Diabetic medications and how to care for oneself with T2 Diabetes in general. Patient states she has never been provided this information and is desperate need of it. Please advise. Katha Hamming

## 2022-12-06 ENCOUNTER — Telehealth: Payer: Self-pay | Admitting: Family Medicine

## 2022-12-06 NOTE — Telephone Encounter (Signed)
Prescription Request  12/06/2022  LOV: 10/18/2022  What is the name of the medication or equipment?  hydrALAZINE (APRESOLINE) 25 MG tablet  Have you contacted your pharmacy to request a refill? Yes   Which pharmacy would you like this sent to?  [id number] [place], [place] - [address] [place] [address] Phone: [phone] Fax: [phone]    Patient notified that their request is being sent to the clinical staff for review and that they should receive a response within 2 business days.   Please advise at Mobile [phone] (mobile)

## 2022-12-08 ENCOUNTER — Other Ambulatory Visit: Payer: Self-pay | Admitting: Family Medicine

## 2022-12-08 DIAGNOSIS — E1159 Type 2 diabetes mellitus with other circulatory complications: Secondary | ICD-10-CM

## 2022-12-08 MED ORDER — HYDRALAZINE HCL 25 MG PO TABS
25.0000 mg | ORAL_TABLET | Freq: Two times a day (BID) | ORAL | 0 refills | Status: DC
Start: 2022-12-08 — End: 2023-01-01

## 2022-12-08 NOTE — Telephone Encounter (Signed)
#   60 tablets sent to pharmacy on file. Follow-up with PCP.

## 2022-12-12 ENCOUNTER — Encounter: Payer: Self-pay | Admitting: Family Medicine

## 2022-12-12 ENCOUNTER — Ambulatory Visit (INDEPENDENT_AMBULATORY_CARE_PROVIDER_SITE_OTHER): Payer: Medicare HMO | Admitting: Family Medicine

## 2022-12-12 VITALS — BP 150/75 | HR 95 | Temp 98.0°F | Resp 19 | Ht 65.0 in | Wt 262.0 lb

## 2022-12-12 DIAGNOSIS — M25512 Pain in left shoulder: Secondary | ICD-10-CM | POA: Diagnosis not present

## 2022-12-12 DIAGNOSIS — G8929 Other chronic pain: Secondary | ICD-10-CM | POA: Diagnosis not present

## 2022-12-12 MED ORDER — LIDOCAINE 5 % EX PTCH: 1.0000 | MEDICATED_PATCH | CUTANEOUS | 0 refills | Status: AC

## 2022-12-12 MED ORDER — DICLOFENAC SODIUM 1 % EX GEL: 2.0000 g | Freq: Four times a day (QID) | CUTANEOUS | 1 refills | Status: DC

## 2022-12-12 NOTE — Progress Notes (Signed)
Acute Office Visit  Subjective:     Patient ID: [name] [name], female    DOB: April 14, 1940, 82 y.o.   MRN: [id number]  Chief Complaint  Patient presents with   Shoulder Pain    Left shoulder pain onset for 6 mo , pt states she been to ortho, she had a shot broke out in a rash and the shot did not help with the pain    Shoulder Pain   Patient is in today for acute visit.  Left shoulder pain Pt with chronic left shoulder pain for the last 6 months. She seen [facility] for this and she's received an injection in her shoulder 2 months ago. She has had xrays  done and was told she has 'bone on bone'. NO records available and pt is historian today. She has used [facility] pain medicine and this hasn't helped. Pain is 7/10. She is allergic to codeine and Percocet.   Review of Systems  Musculoskeletal:  Positive for joint pain.       Left shoulder pain  All other systems reviewed and are negative.       Objective:    BP (!) 150/75   Pulse 95   Temp 98 F (36.7 C) (Oral)   Resp 19   Ht 5\' 5"  (1.651 m)   Wt 262 lb (118.8 kg)   SpO2 95%   BMI 43.60 kg/m  BP Readings from Last 3 Encounters:  12/12/22 (!) 150/75  10/18/22 (!) 138/57  08/23/22 124/68      Physical Exam Vitals and nursing note reviewed.  Constitutional:      Appearance: Normal appearance. She is normal weight.  HENT:     Head: Normocephalic and atraumatic.     Right Ear: External ear normal.     Left Ear: External ear normal.     Nose: Nose normal.     Mouth/Throat:     Mouth: Mucous membranes are moist.     Pharynx: Oropharynx is clear.  Eyes:     Conjunctiva/sclera: Conjunctivae normal.     Pupils: Pupils are equal, round, and reactive to light.  Cardiovascular:     Rate and Rhythm: Normal rate.  Pulmonary:     Effort: Pulmonary effort is normal.  Musculoskeletal:        General: Tenderness present.     Comments: Diminished ROM of left shoulder to 90 degrees  Skin:    General: Skin is warm.      Capillary Refill: Capillary refill takes less than 2 seconds.  Neurological:     General: No focal deficit present.     Mental Status: She is alert and oriented to person, place, and time. Mental status is at baseline.  Psychiatric:        Mood and Affect: Mood normal.        Behavior: Behavior normal.        Thought Content: Thought content normal.        Judgment: Judgment normal.    No results found for any visits on 12/12/22.      Assessment & Plan:   Problem List Items Addressed This Visit   None Visit Diagnoses     Chronic left shoulder pain    -  Primary   Relevant Medications   lidocaine (LIDODERM) 5 %   diclofenac Sodium (VOLTAREN ARTHRITIS PAIN) 1 % GEL      Chronic left shoulder pain -     Lidocaine; Place 1 patch onto the skin daily.  Remove & Discard patch within 12 hours or as directed by MD  Dispense: 9 patch; Refill: 0 -     Diclofenac Sodium; Apply 2 g topically 4 (four) times daily.  Dispense: 100 g; Refill: 1   Worsening left shoulder pain. Advised to follow up with Ortho. May likely need MRI and further treatment options.  Pt allergic to codeine and percocet, unable to add opioid for pain. Will add Lidocaine patch and voltaren gel to use 4 times a day.  Meds ordered this encounter  Medications   lidocaine (LIDODERM) 5 %    Sig: Place 1 patch onto the skin daily. Remove & Discard patch within 12 hours or as directed by MD    Dispense:  9 patch    Refill:  0   diclofenac Sodium (VOLTAREN ARTHRITIS PAIN) 1 % GEL    Sig: Apply 2 g topically 4 (four) times daily.    Dispense:  100 g    Refill:  1    No follow-ups on file.  Suzan Slick, MD

## 2022-12-26 ENCOUNTER — Ambulatory Visit: Payer: Medicare HMO | Admitting: Adult Health

## 2022-12-29 ENCOUNTER — Telehealth (INDEPENDENT_AMBULATORY_CARE_PROVIDER_SITE_OTHER): Payer: Medicare HMO | Admitting: Primary Care

## 2022-12-29 DIAGNOSIS — G4733 Obstructive sleep apnea (adult) (pediatric): Secondary | ICD-10-CM

## 2022-12-29 DIAGNOSIS — J449 Chronic obstructive pulmonary disease, unspecified: Secondary | ICD-10-CM

## 2022-12-29 DIAGNOSIS — Z87891 Personal history of nicotine dependence: Secondary | ICD-10-CM

## 2022-12-29 DIAGNOSIS — R911 Solitary pulmonary nodule: Secondary | ICD-10-CM

## 2022-12-29 NOTE — Progress Notes (Signed)
Virtual Visit via Video Note  I connected with [name] [name] on 12/29/22 at  2:30 PM EST by a video enabled telemedicine application and verified that I am speaking with the correct person using two identifiers.  Location: Patient: Home Provider: Office   I discussed the limitations of evaluation and management by telemedicine and the availability of in person appointments. The patient expressed understanding and agreed to proceed.  History of Present Illness: 82 year old female, former smoker. PMH significant for COPD, OSA, HTN, CAD, GERD, bipolar disease, chronic pain syndrome. Patient of Dr. Tonia Brooms, last see on 12/20/20.   Previous [facility] pulmonary encounter: 12/20/20- Dr. Tonia Brooms  This is a 82 year old female, patient of Dr. Wynona Neat, referred for right lower lobe pulmonary nodule.  She is a former smoker quit in 1988.  She had a CT scan of the chest completed in November 2020 that is visible in PACS.  This showed a 4 mm right lower lobe pulmonary nodule.  She had CT scan follow-up that was completed.  CT follow-up imaging was completed on 07/22/2020.  This CT scan of the chest revealed a 7 mm right lower lobe pulmonary nodule that was previously 4 mm and had increased in size and now has slight spiculated margins.  She had this followed by a nuclear medicine pet image on 08/19/2020.  Nuclear medicine pet imaging shows no significant hypermetabolism however the lesion itself is smaller than the lower limit for size related to PET scans.  From a respiratory standpoint she does feel short of breath with exertion.  Her mobility is aided by a rolling walker.  She is currently using as needed albuterol.  Has been told she may have COPD in the past.  She does have PFTs scheduled for today which have not been completed yet.  OV 12/20/2020: Here today for follow-up regarding COPD, shortness of breath.  She does have OSA follows with Dr. Val Eagle.  She enjoys using her Trelegy.  However this is costly.  She is unable  to afford it.  She would like to stay on it if possible.  We will fill out the financial aid application to help her.  She also has a planned trip to [place].  She has experienced high altitude sickness before.  Associated with headache and vomiting.  She is worried about this happening again.  We talked about prophylactic measures today.  01/27/2021 Patient contacted today for virtual video visit. She reports having chest congestion with associated shortness of breath x 10 days. Cough is productive with green mucus. She is compliant with Trelegy daily but feels it has not been helping the last week or so. She is currently on kefex for UTI. She has some dirrhea from abx. She is on probiotic. Denies f/c/s, chest discomfort, nausea or vomiting. No know exposure to covid or influenza.  COPD exacerbation: - Patient has had a productive cough with associated sob x 10 days. Symptoms are consistent with acute exacerbation of her underlyin COPD. If sob persists may consider increasing Trelegy to or switching to [employer]  - Continue Trelegy one puff daily and prn albuterol 2 puffs every 4-6 hours  - Rx Doxycycline 100mg  BID x 7 days and prednisone taper 40mg  x 2 days; 30mg  x 2 days; 20mg  x 2 days; 10mg  x 2 days  - Advised she take mucinex 600mg  twice daily until chest congestion improved     03/01/22- Dr. Tonia Brooms This is a 82 year old female, right lower lobe pulmonary nodule  somewhat slowly enlarging over time.  She has a right upper lobe groundglass nodule.The right lower lobe nodule has increased from 5 mm to 7 mm.  The right upper lobe nodule has dissipated in size.  She currently has COPD which is managed with Trelegy.  She is doing really well and breathing well since starting that.   Plan: Continue Trelegy Continue albuterol as needed Repeat noncontrasted CT chest in January 2025. Follow-up with [place] after her CT chest. Continue CPAP management by Dr. Val Eagle.  In sleep  clinic.   12/29/2022- interim hx  Discussed the use of AI scribe software for clinical note transcription with the patient, who gave verbal consent to proceed.  History of Present Illness   The patient, under the care of two physicians for sleep apnea and COPD, reports no current respiratory symptoms such as cough, bloody or purulent mucus, weight loss, fever, or shortness of breath. She attributes this improvement to the daily use of Trelegy, which has been effective since initiation. The patient uses Albuterol rescue inhaler infrequently, approximately once or twice a week. She has not yet begun a planned exercise program.  Regarding the lung nodules, the patient is aware of a slowly enlarging nodule on the right side, which has increased from 5 to 7 millimeters. She is scheduled for repeat CT imaging of her chest on January 7th.   In terms of sleep apnea, the patient has been using a CPAP machine consistently every night for approximately twenty years. The current machine is about ten years old. She has a supply of replacement parts, but has not received established with a local DME company since relocating. Unable to review compliance report today, she will need to bring in SD card to her next visit for download.     Observations/Objective:  Appears well without overt respiratory symptoms  Assessment and Plan:  COPD Stable on Trelegy, with infrequent use of rescue inhaler. Plans to start an exercise program. -Continue Trelegy daily and Albuterol HFA as needed. -Encourage participation in exercise program.  Pulmonary nodules Slowly enlarging RLL pulmonary nodule, increased from 5mm to 7mm. No current respiratory symptoms. -Scheduled CT scan on 02/13/2023 for further evaluation. -Follow up scheduled for end of January with Dr. Tonia Brooms   OSA Reports consistent use of her CPAP unit nightly, current machine being 82 years old. She is not established with local DME company and has not  received new supplies since moving. -Advised patient bring SD card from CPAP machine to next appointment for download and verification of use. -Plan to order new CPAP machine and supplies from local medical supply store after verification of use.      Follow Up Instructions:  - January with Dr. Tonia Brooms following CT CHEST  I discussed the assessment and treatment plan with the patient. The patient was provided an opportunity to ask questions and all were answered. The patient agreed with the plan and demonstrated an understanding of the instructions.   The patient was advised to call back or seek an in-person evaluation if the symptoms worsen or if the condition fails to improve as anticipated.  I provided 25 minutes of non-face-to-face time during this encounter.   Glenford Bayley, NP

## 2022-12-29 NOTE — Patient Instructions (Signed)
-  CHRONIC OBSTRUCTIVE PULMONARY DISEASE (COPD): COPD is a chronic lung condition that makes it hard to breathe. You are currently stable on Trelegy, and you use your rescue inhaler infrequently. Please continue taking Trelegy daily and use Albuterol as needed. I encourage you to start your planned exercise program, as it can help improve your overall lung function.  -PULMONARY NODULE: A pulmonary nodule is a small growth in the lung. Your nodule on the right side has slowly increased in size from 5mm to 7mm. We have scheduled a CT scan on February 13, 2023, to further evaluate this nodule.  -OBSTRUCTIVE SLEEP APNEA (OSA): OSA is a condition where your breathing stops and starts during sleep. You have been using a CPAP machine consistently for 20 years, but your current machine is 82 years old. Please bring the SD card from your CPAP machine to your next appointment so we can download and verify your usage. After verification, we will order a new CPAP machine and supplies from a local medical supply store.  INSTRUCTIONS: Your CT scan is scheduled for February 13, 2023, to evaluate the pulmonary nodule.  Follow-up You have a visit with Dr. Tonia Brooms already set up end on January to review CT results/ Please remember to bring the SD card from your CPAP machine to your next appointment.

## 2022-12-30 ENCOUNTER — Other Ambulatory Visit: Payer: Self-pay | Admitting: Family Medicine

## 2022-12-30 DIAGNOSIS — I152 Hypertension secondary to endocrine disorders: Secondary | ICD-10-CM

## 2023-01-02 ENCOUNTER — Encounter: Payer: Self-pay | Admitting: Family Medicine

## 2023-01-10 ENCOUNTER — Other Ambulatory Visit (HOSPITAL_BASED_OUTPATIENT_CLINIC_OR_DEPARTMENT_OTHER): Payer: Self-pay | Admitting: Family Medicine

## 2023-01-11 ENCOUNTER — Other Ambulatory Visit: Payer: Self-pay

## 2023-01-11 ENCOUNTER — Other Ambulatory Visit (HOSPITAL_BASED_OUTPATIENT_CLINIC_OR_DEPARTMENT_OTHER): Payer: Self-pay | Admitting: Family Medicine

## 2023-01-11 ENCOUNTER — Encounter: Payer: Self-pay | Admitting: Family Medicine

## 2023-01-11 DIAGNOSIS — K219 Gastro-esophageal reflux disease without esophagitis: Secondary | ICD-10-CM

## 2023-01-11 NOTE — Telephone Encounter (Deleted)
Lvm for patient to return call to verify prescription and pharmacy

## 2023-01-12 ENCOUNTER — Ambulatory Visit: Payer: Medicare HMO | Admitting: Obstetrics and Gynecology

## 2023-01-17 ENCOUNTER — Ambulatory Visit: Payer: Medicare HMO | Admitting: Obstetrics and Gynecology

## 2023-01-19 ENCOUNTER — Other Ambulatory Visit: Payer: Self-pay | Admitting: Family Medicine

## 2023-01-19 DIAGNOSIS — E1165 Type 2 diabetes mellitus with hyperglycemia: Secondary | ICD-10-CM

## 2023-01-22 ENCOUNTER — Other Ambulatory Visit: Payer: Self-pay | Admitting: Obstetrics and Gynecology

## 2023-01-22 DIAGNOSIS — N3281 Overactive bladder: Secondary | ICD-10-CM

## 2023-01-23 ENCOUNTER — Telehealth: Payer: Self-pay

## 2023-01-23 NOTE — Telephone Encounter (Signed)
Copied from CRM [id number]. Topic: Referral - Question >> Jan 22, 2023  3:31 PM Prudencio Pair wrote: Reason for CRM: Patient would like for nurse/provider to recommend the best orthopedic doctor. Please give patient a call back with this information. As of now, pt states she does not need a referral, just want to know who would be recommended.

## 2023-01-23 NOTE — Telephone Encounter (Signed)
Spoke with patient and asked her the questions the provider wanted to know regarding the recommendation, patient explained that it was still her left shoulder giving her pain and based off of her insurance she did not need a referral. Informed patient that I would send message to provider and would call her back with recomendation

## 2023-01-23 NOTE — Telephone Encounter (Signed)
Dr Huel Cote at [facility] at [facility]

## 2023-01-23 NOTE — Telephone Encounter (Signed)
Spoke with patient and she is aware of providers recommendation and will call to schedule appointment

## 2023-01-23 NOTE — Telephone Encounter (Signed)
Please contact pt and inquire more details. What is her injury site etc for the ortho provider? They all have certain specialties they handle.

## 2023-01-28 ENCOUNTER — Other Ambulatory Visit: Payer: Self-pay | Admitting: Family Medicine

## 2023-02-13 ENCOUNTER — Ambulatory Visit (HOSPITAL_BASED_OUTPATIENT_CLINIC_OR_DEPARTMENT_OTHER): Payer: Medicare HMO

## 2023-02-15 ENCOUNTER — Ambulatory Visit: Payer: Medicare HMO | Admitting: Physician Assistant

## 2023-02-19 ENCOUNTER — Other Ambulatory Visit: Payer: Self-pay | Admitting: Obstetrics and Gynecology

## 2023-02-19 ENCOUNTER — Encounter: Payer: Self-pay | Admitting: Obstetrics and Gynecology

## 2023-02-19 ENCOUNTER — Ambulatory Visit (INDEPENDENT_AMBULATORY_CARE_PROVIDER_SITE_OTHER): Payer: Medicare HMO | Admitting: Obstetrics and Gynecology

## 2023-02-19 DIAGNOSIS — N3281 Overactive bladder: Secondary | ICD-10-CM

## 2023-02-19 DIAGNOSIS — Z87440 Personal history of urinary (tract) infections: Secondary | ICD-10-CM | POA: Diagnosis not present

## 2023-02-19 DIAGNOSIS — N390 Urinary tract infection, site not specified: Secondary | ICD-10-CM

## 2023-02-19 MED ORDER — METHENAMINE HIPPURATE 1 G PO TABS: 1.0000 g | ORAL_TABLET | Freq: Two times a day (BID) | ORAL | 11 refills | Status: AC

## 2023-02-19 MED ORDER — MIRABEGRON ER 25 MG PO TB24: 25.0000 mg | ORAL_TABLET | Freq: Every day | ORAL | 0 refills | Status: DC

## 2023-02-19 NOTE — Progress Notes (Signed)
 [facility] Return Visit  SUBJECTIVE  History of Present Illness: [name] [name] is a 83 y.o. female seen in follow-up for recurrent urinary tract infections.   At end of November, she had pain and burning and was treated for a urinary tract infection. She was given cefdinir for 5 days. Then she had symptoms in mid December and was given keflex . Urine culture at that visit was negative.   Has not been having any symptoms since then. She is using the estrogen vaginal tablets once a week. Difficult for her to place (tried the estring  but did not stay in due to short vaginal length).   Has Interstim device but does not have the controller with her today- has not had any issues. Myrbetriq  25mg  has been working well. Does not leak urine often, only occasionally with a hard cough.   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   Past Surgical History: She  has a past surgical history that includes Abdominal hysterectomy (1980); Melanoma excision (Right); Cholecystectomy (1980); Replacement total knee (Bilateral, 2000); sacral neuromodulation; and Breast excisional biopsy.    Medications: She has a current medication list which includes the following prescription(s): albuterol , alprazolam , AMBULATORY NON FORMULARY MEDICATION, amlodipine -valsartan -hctz, arthritis pain relieving, atorvastatin , probiotic formula, blood glucose meter kit and supplies, sarna, carvedilol , carvedilol , cetirizine, cholestyramine , clonidine , colchicine -probenecid , diclofenac  sodium, easymax test, famotidine , farxiga , ferrous sulfate , hydralazine , lactase, lidocaine , methenamine , paroxetine , psyllium, trelegy ellipta , yuvafem , and mirabegron  er.   Allergies: Patient is allergic to azithromycin , bee venom, erythromycin base, metronidazole, nitrofurantoin macrocrystal, penicillins, sulfa antibiotics, cymbalta [duloxetine hcl], ivp dye [iodinated contrast media], penicillin g, codeine, erythromycin, fosfomycin, levofloxacin, nitrofurantoin, and percocet [oxycodone-acetaminophen ].   Social History: Patient  reports that she quit smoking about 37 years ago. Her smoking use included cigarettes. She started smoking about 68 years ago. She has never used smokeless tobacco. She reports that she does not currently use alcohol. She reports that she does not use drugs.      OBJECTIVE     Physical Exam: Vitals:   02/19/23 1519  BP: 122/73  Pulse: 71     Gen: No apparent distress, A&O x 3.  Detailed Urogynecologic Evaluation:  Deferred     ASSESSMENT AND PLAN    [name] [name] is a 83 y.o. with:  1. Overactive bladder   2. Recurrent urinary tract infection     - Has been doing well overall with very few UTIs. Since she is having a difficult time with the vaginal estrogen, will start methenamine  1g BID for UTI prevention as well.  - Continue vaginal estrogen (yuvafem ) twice a week- refill due in July - Continue Myrbetriq  25mg  daily, refill provided - Advised patient to bring interstim remote to next visit. Not currently having any issues with the device.   Return 1 year or sooner if  needed  Rosaline LOISE Caper, MD

## 2023-02-20 NOTE — Telephone Encounter (Signed)
 Per speaking to the pharmacy, the Mibetriq is covered but has a copay of $75 for a 30 days supply or a $150 for a 90 days supply. Patient agrees to pick up the Rx as a 90 day supply. The pharmacist explained that this year medicare part D plans have a prescription deductible. Rx was changed per verbal order at the time of the call.

## 2023-02-21 ENCOUNTER — Ambulatory Visit (HOSPITAL_BASED_OUTPATIENT_CLINIC_OR_DEPARTMENT_OTHER): Payer: Medicare HMO | Attending: Pulmonary Disease

## 2023-02-25 ENCOUNTER — Other Ambulatory Visit: Payer: Self-pay | Admitting: Pulmonary Disease

## 2023-02-28 ENCOUNTER — Telehealth: Payer: Self-pay | Admitting: Family Medicine

## 2023-02-28 NOTE — Telephone Encounter (Signed)
Pt will need OV for follow up and urine + culture to assure she was prescribed the correct antibiotic the first time.

## 2023-02-28 NOTE — Telephone Encounter (Signed)
Copied from CRM [id number]. Topic: Clinical - Medication Question >> Feb 28, 2023  3:22 PM Geroge Baseman wrote: Reason for CRM: Patient was seen in urgent care and she would like to know if dr Wyline Mood can prescribe her more medication for her UTI as it is still not gone away. Offered to send to nurse triage but refused cause she said she already knows what is wrong. Please call as soon as you can to let this patient know what options she has as she said she cannot make it in for an appointment until Thursday.

## 2023-02-28 NOTE — Telephone Encounter (Signed)
Spoke with patient and advised patient of what provider recommended and patient states that she will make an appointment for tomorrow

## 2023-03-01 ENCOUNTER — Ambulatory Visit: Payer: Medicare HMO | Admitting: Physician Assistant

## 2023-03-01 ENCOUNTER — Ambulatory Visit (INDEPENDENT_AMBULATORY_CARE_PROVIDER_SITE_OTHER): Payer: Medicare HMO | Admitting: Family Medicine

## 2023-03-01 ENCOUNTER — Ambulatory Visit: Payer: Medicare HMO | Admitting: Pulmonary Disease

## 2023-03-01 VITALS — BP 149/67 | HR 88 | Temp 98.0°F | Resp 18 | Ht 65.0 in | Wt 260.6 lb

## 2023-03-01 DIAGNOSIS — N3000 Acute cystitis without hematuria: Secondary | ICD-10-CM

## 2023-03-01 DIAGNOSIS — R82998 Other abnormal findings in urine: Secondary | ICD-10-CM | POA: Diagnosis not present

## 2023-03-01 MED ORDER — DOXYCYCLINE HYCLATE 100 MG PO TABS: 100.0000 mg | ORAL_TABLET | Freq: Two times a day (BID) | ORAL | 0 refills | Status: DC

## 2023-03-01 NOTE — Progress Notes (Signed)
Acute Office Visit  Subjective:     Patient ID: [name] [name], female    DOB: 03-17-1940, 83 y.o.   MRN: [id number]  Chief Complaint  Patient presents with   Follow-up    Patient is here for a follow up visit for a Urinary tract infection. Patient states that she did go to Urgent care and was prescribed an antibiotic but she states that it does not work    HPI Patient is in today for acute visit.  Pt here with sister from out of town. Pt reports she was treated for UTI last week at an urgent care. She was given Keflex for 5 days. She has completed the antibiotics and reports she is still having urinary frequency and dysuria. Denies fever or flank pains. No hematuria. She has multiple allergies to antibiotics including Macrobid, Levaquin, Penicillin, and sulfa drugs.  Review of Systems  Constitutional:  Negative for chills and fever.  Genitourinary:  Positive for dysuria and frequency. Negative for flank pain and hematuria.  All other systems reviewed and are negative.      Objective:    BP (!) 149/67   Pulse 88   Temp 98 F (36.7 C) (Oral)   Resp 18   Ht 5\' 5"  (1.651 m)   Wt 260 lb 9.6 oz (118.2 kg)   SpO2 95%   BMI 43.37 kg/m  BP Readings from Last 3 Encounters:  03/01/23 (!) 149/67  02/19/23 122/73  12/12/22 (!) 150/75      Physical Exam Vitals and nursing note reviewed.  Constitutional:      Appearance: Normal appearance. She is normal weight.  HENT:     Head: Normocephalic and atraumatic.     Right Ear: External ear normal.     Left Ear: External ear normal.     Nose: Nose normal.     Mouth/Throat:     Mouth: Mucous membranes are moist.     Pharynx: Oropharynx is clear.  Eyes:     Conjunctiva/sclera: Conjunctivae normal.     Pupils: Pupils are equal, round, and reactive to light.  Cardiovascular:     Rate and Rhythm: Normal rate.  Pulmonary:     Effort: Pulmonary effort is normal.  Skin:    General: Skin is warm.     Capillary Refill:  Capillary refill takes less than 2 seconds.  Neurological:     General: No focal deficit present.     Mental Status: She is alert and oriented to person, place, and time. Mental status is at baseline.  Psychiatric:        Mood and Affect: Mood normal.        Behavior: Behavior normal.        Thought Content: Thought content normal.        Judgment: Judgment normal.   No results found for any visits on 03/01/23.      Assessment & Plan:   Problem List Items Addressed This Visit   None Visit Diagnoses       Acute cystitis without hematuria    -  Primary   Relevant Medications   doxycycline (VIBRA-TABS) 100 MG tablet   Other Relevant Orders   Urine Culture     Leukocytes in urine       Relevant Medications   doxycycline (VIBRA-TABS) 100 MG tablet   Other Relevant Orders   Urine Culture       Meds ordered this encounter  Medications   doxycycline (VIBRA-TABS) 100 MG tablet  Sig: Take 1 tablet (100 mg total) by mouth 2 (two) times daily.    Dispense:  14 tablet    Refill:  0   Continued UTI symptoms. Add another course with Doxycycline 100mg  BID (not ideal but with her multiple allergies, limited options) for 7 days. Send urine for culture.  No follow-ups on file.  Suzan Slick, MD

## 2023-03-02 ENCOUNTER — Encounter: Payer: Self-pay | Admitting: Family Medicine

## 2023-03-04 LAB — URINE CULTURE

## 2023-03-05 ENCOUNTER — Encounter: Payer: Self-pay | Admitting: Family Medicine

## 2023-03-05 ENCOUNTER — Ambulatory Visit (HOSPITAL_BASED_OUTPATIENT_CLINIC_OR_DEPARTMENT_OTHER): Payer: Medicare HMO | Admitting: Orthopaedic Surgery

## 2023-03-06 ENCOUNTER — Telehealth: Payer: Self-pay | Admitting: Acute Care

## 2023-03-06 NOTE — Telephone Encounter (Signed)
Left VM for patient to call regarding appt 03/07/23 with Saralyn Pilar, NP.  Patient appt was for CT results review but patient did not complete the CT.  Left Vm to confirm cancellation of this appt and need to reschedule CT and follow up OV.

## 2023-03-07 ENCOUNTER — Encounter: Payer: Self-pay | Admitting: Acute Care

## 2023-03-07 ENCOUNTER — Ambulatory Visit (INDEPENDENT_AMBULATORY_CARE_PROVIDER_SITE_OTHER): Payer: Medicare HMO | Admitting: Acute Care

## 2023-03-07 VITALS — BP 112/72 | HR 84 | Ht 65.0 in | Wt 263.6 lb

## 2023-03-07 DIAGNOSIS — R911 Solitary pulmonary nodule: Secondary | ICD-10-CM

## 2023-03-07 DIAGNOSIS — R0609 Other forms of dyspnea: Secondary | ICD-10-CM

## 2023-03-07 DIAGNOSIS — Z87891 Personal history of nicotine dependence: Secondary | ICD-10-CM

## 2023-03-07 DIAGNOSIS — G4733 Obstructive sleep apnea (adult) (pediatric): Secondary | ICD-10-CM

## 2023-03-07 NOTE — Progress Notes (Signed)
History of Present Illness [name] [name] is a 83 y.o. female  former smoker. PMH significant for COPD, OSA, HTN, CAD, GERD, bipolar disease, chronic pain syndrome. Patient of Dr. Tonia [name], last see on 12/29/2022  Synopsis This is a 83 year old female, patient of Dr. Wynona [name], referred for right lower lobe pulmonary nodule. She is a former smoker quit in 1988. She had a CT scan of the chest completed in November 2020 that is visible in PACS. This showed a 4 mm right lower lobe pulmonary nodule. She had CT scan follow-up that was completed. CT follow-up imaging was completed on 07/22/2020. This CT scan of the chest revealed a 7 mm right lower lobe pulmonary nodule that was previously 4 mm and had increased in size and now has slight spiculated margins. She had this followed by a nuclear medicine pet image on 08/19/2020. Nuclear medicine pet imaging shows no significant hypermetabolism however the lesion itself is smaller than the lower limit for size related to PET scans. Dr. Tonia [name] has been doing annual surveillance imaging to ensure stability of the right upper lobe of concern.  Patient was scheduled for a January 2025 follow-up scan, which she has not had done.  She is here today to discuss several other respiratory issues.   03/07/2023 Pt. Presents for follow up. She was here for a lung nodule follow up, however surveillance CT Chest was not done. She wanted to be seen regardless due to  Increased cough and sputum, initially green, now clear.  Pt had several separate issues she needed discussed.  She feels her dyspnea with exertion has gotten worse over time.  She is compliant with her Trelegy daily but despite this she feels her breathing is worse.  She states it is worse with exertion but also at rest.  She has also noted she has some left sided lower extremity edema that is new.  Plan will be for pulmonary function testing as well as a heart echo to evaluate because of worsening dyspnea.  Both of these  tests were last done in 2022.  Patient has a history of obstructive sleep apnea initially diagnosed in [place].  She has been on CPAP therapy for years.  Her current CPAP machine is greater than 22 years old and she would like a replacement.  She has had several telephone calls with the office regarding this issue.  She was last seen for OSA in 2022.  Plan was for patient to bring in the CPAP machine so that we can get a download which would most likely be required for any DME to approve a new machine.  Per patient's husband there is no SIM card. I discussed with the patient that she was most likely get a need to repeat home sleep study to confirm sleep apnea before we can provide her with a new CPAP machine through any DME.  She is in agreement with repeat home sleep study as she is concerned that perhaps her sleep apnea is worsened with some weight gain.  We did walk the patient today in the office to see if she had any ambulatory desaturations however she maintained her oxygen saturations greater than 90 at all times.  Plan is for follow-up after PFTs and echo. We will also need to ensure patient follows through and does the CT chest to reevaluate the pulmonary nodule we have been following on surveillance imaging, which is due to be evaluated this month.  Patient will follow-up with me after to review the results  of the imaging   Test Results: CT chest 02/23/2022 Indistinct peripheral right upper lobe 0.3 cm pulmonary nodule, decreased from 0.5 cm on 08/22/2021 chest CT, favoring a resolving benign inflammatory nodule. Solid 0.7 cm subpleural posterior right lower lobe pulmonary nodule, stable back to 02/14/2021 chest CT, mildly increased from 0.5 cm on baseline 12/17/2018 chest CT. Suggest follow-up chest CT in 12 months. Left main and 3 vessel coronary atherosclerosis. Stable mild cardiomegaly. Mild diffuse hepatic steatosis. Stable left adrenal myelolipoma, for which no follow-up  imaging is recommended. Aortic Atherosclerosis    Latest Ref Rng & Units 07/05/2021   10:21 AM 03/24/2021    1:23 PM 03/13/2021    2:21 AM  CBC  WBC 3.4 - 10.8 x10E3/uL 8.7  11.3  11.8   Hemoglobin 11.1 - 15.9 g/dL 16.1  8.9  8.2   Hematocrit 34.0 - 46.6 % 41.0  30.4  28.5   Platelets 150 - 450 x10E3/uL 275  348  349        Latest Ref Rng & Units 10/18/2022   11:45 AM 11/01/2021   11:26 AM 11/01/2021   11:25 AM  BMP  Glucose 70 - 99 mg/dL 096  045  409   BUN 8 - 27 mg/dL 31  14  16    Creatinine 0.57 - 1.00 mg/dL 8.11  9.14  7.82   BUN/Creat Ratio 12 - 28 23  14  16    Sodium 134 - 144 mmol/L 144  142  142   Potassium 3.5 - 5.2 mmol/L 4.2  4.5  4.4   Chloride 96 - 106 mmol/L 106  105  104   CO2 20 - 29 mmol/L 21  22  23    Calcium 8.7 - 10.3 mg/dL 9.3  9.6  9.6     BNP    Component Value Date/Time   BNP 20.9 11/01/2021 1126   BNP 80.3 03/13/2021 0221    ProBNP No results found for: "PROBNP"  PFT    Component Value Date/Time   FEV1PRE 1.39 09/02/2020 1123   FEV1POST 1.52 09/02/2020 1123   FVCPRE 1.73 09/02/2020 1123   FVCPOST 1.81 09/02/2020 1123   TLC 5.24 09/02/2020 1123   DLCOUNC 14.28 09/02/2020 1123   PREFEV1FVCRT 80 09/02/2020 1123   PSTFEV1FVCRT 84 09/02/2020 1123    No results found.   Past medical hx 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   Cataract    Closed fracture of upper end of humerus 11/03/2017   COPD (chronic obstructive pulmonary disease) (HCC)    Depression    Diabetes 1.5, managed as type 2 (HCC)    Encounter to establish care 06/03/2020   Essential hypertension 05/19/2019   Female stress incontinence 02/14/2018   GERD (gastroesophageal reflux disease)    History of opioid  abuse (HCC) 05/19/2019   Hyperglycemia 05/19/2019   Hyperlipidemia    Hypertension    Menopause present 02/11/2018   Need for shingles vaccine 07/01/2020   Osteoporosis    Sleep apnea    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     Social History   Tobacco Use   Smoking status: Former    Current packs/day: 0.00    Types: Cigarettes  Start date: 30    Quit date: 1988    Years since quitting: 37.1   Smokeless tobacco: Never  Vaping Use   Vaping status: Never Used  Substance Use Topics   Alcohol use: Not Currently   Drug use: Never    [name] [name] reports that she quit smoking about 37 years ago. Her smoking use included cigarettes. She started smoking about 68 years ago. She has never used smokeless tobacco. She reports that she does not currently use alcohol. She reports that she does not use drugs.  Tobacco Cessation: Former smoker with a 31-pack-year smoking history quit 37 years ago   Past surgical hx, Family hx, Social hx all reviewed.  Current Outpatient Medications on File Prior to Visit  Medication Sig   albuterol (VENTOLIN HFA) 108 (90 Base) MCG/ACT inhaler INHALE 1-2 PUFFS INTO THE LUNGS EVERY 4 HOURS AS NEEDED FOR WHEEZING OR SHORTNESS OF BREATH.   ALPRAZolam (XANAX) 0.25 MG tablet TAKE (1) TABLET BY MOUTH TWICE DAILY AT 11AM & 5PM FOR ANXIETY. (Patient taking differently: Take 0.25 mg by mouth 2 (two) times daily. 1100 and 1500 And an extra 0.25mg  oral twice daily as needed for anxiety)   AMBULATORY NON FORMULARY MEDICATION One four wheel rolling walker with seat and hand brakes. Brand/style covered by insurance. Medically necessary for ICD-10: R26.81, Z74.1   amLODIPine-Valsartan-HCTZ 10-320-25 MG TABS TAKE 1 TABLET BY MOUTH EVERY DAY   ARTHRITIS PAIN RELIEVING 0.075 % topical cream Apply 1 application topically 3 (three) times daily as needed (neuropathic pain in feet).   atorvastatin (LIPITOR) 10 MG tablet TAKE 1 TABLET BY MOUTH EVERY DAY   Bacillus  Coagulans-Inulin (PROBIOTIC FORMULA) 1-250 BILLION-MG CAPS TAKE 1 CAPSULE (250MG ) BY MOUTH 2 TIMES A DAY.   blood glucose meter kit and supplies KIT Meter, test strips, lancets, and alcohol swabs. Use for testing blood sugar every morning before meals and up to 3 additional times per day as needed. Brand based on patient and insurance coverage. 100 strips with 99 refills, 100 lancets with 99 refills, 200 alcohol swabs with 99 refills. Dx: E11.65   camphor-menthol (SARNA) lotion Apply 1 application topically as needed for itching. (Patient taking differently: Apply 1 application  topically 2 (two) times daily as needed for itching.)   carvedilol (COREG) 6.25 MG tablet TAKE 1 TABLET BY MOUTH 2 TIMES DAILY WITH A MEAL.   carvedilol (COREG) 6.25 MG tablet Take 1 tablet (6.25 mg total) by mouth 2 (two) times daily with a meal.   cetirizine (ZYRTEC) 10 MG tablet TAKE 1 TABLET BY MOUTH EVERY DAY   cholestyramine (QUESTRAN) 4 g packet TAKE 1 PACKET (4 G TOTAL) BY MOUTH 2 TIMES DAILY.   cloNIDine (CATAPRES) 0.1 MG tablet Take 1 tablet (0.1 mg total) by mouth 3 (three) times daily as needed (systolic blood pressurre above 190).   colchicine-probenecid 0.5-500 MG tablet Take 1 tablet by mouth daily.   diclofenac Sodium (VOLTAREN ARTHRITIS PAIN) 1 % GEL Apply 2 g topically 4 (four) times daily.   doxycycline (VIBRA-TABS) 100 MG tablet Take 1 tablet (100 mg total) by mouth 2 (two) times daily.   EASYMAX TEST test strip    famotidine (PEPCID) 20 MG tablet Take 1 tablet (20 mg total) by mouth daily as needed for heartburn or indigestion.   FARXIGA 10 MG TABS tablet TAKE 1 TABLET BY MOUTH DAILY BEFORE BREAKFAST.   ferrous sulfate 325 (65 FE) MG tablet TAKE 1 TABLET BY MOUTH EVERY DAY   Fluticasone-Umeclidin-Vilant (TRELEGY ELLIPTA)  100-62.5-25 MCG/ACT AEPB INHALE 1 PUFF INTO THE LUNGS DAILY. ** RINSE MOUTH AFTER USE**   hydrALAZINE (APRESOLINE) 25 MG tablet TAKE 1 TABLET BY MOUTH TWICE A DAY   Lactase 9000 units  TABS Take one tablet with the first bite of meals containing milk or dairy products. (Patient taking differently: Take 9,000 Units by mouth See admin instructions. Take 9000units with the first bite of meals containing milk or dairy products.)   lidocaine (LIDODERM) 5 % Place 1 patch onto the skin daily. Remove & Discard patch within 12 hours or as directed by MD   methenamine (HIPREX) 1 g tablet Take 1 tablet (1 g total) by mouth 2 (two) times daily with a meal.   mirabegron ER (MYRBETRIQ) 25 MG TB24 tablet Take 1 tablet (25 mg total) by mouth daily.   PARoxetine (PAXIL) 40 MG tablet Take 40 mg by mouth daily.   psyllium (METAMUCIL) 58.6 % packet Take 1 packet by mouth daily.   YUVAFEM 10 MCG TABS vaginal tablet PLACE 1 TABLET VAGINALLY TWICE A WEEK   No current facility-administered medications on file prior to visit.     Allergies  Allergen Reactions   Azithromycin Anaphylaxis and Hives   Bee Venom Anaphylaxis   Erythromycin Base Anaphylaxis   Metronidazole Anaphylaxis   Nitrofurantoin Macrocrystal Anaphylaxis   Penicillins Anaphylaxis and Hives    childhood   Sulfa Antibiotics Anaphylaxis and Hives    Childhood   Cymbalta [Duloxetine Hcl]    Ivp Dye [Iodinated Contrast Media]    Penicillin G Hives   Codeine Rash   Erythromycin Rash and Hives   Fosfomycin Rash   Levofloxacin Rash   Nitrofurantoin Hives   Percocet [Oxycodone-Acetaminophen] Rash    Review Of Systems:  Constitutional:   No  weight loss, night sweats,  Fevers, chills, +fatigue, or  lassitude.  HEENT:   No headaches,  Difficulty swallowing,  Tooth/dental problems, or  Sore throat,                No sneezing, itching, ear ache, nasal congestion, post nasal drip,   CV:  No chest pain,  Orthopnea, PND, + swelling in  left lower extremity, No anasarca, dizziness, palpitations, syncope.   GI  No heartburn, indigestion, abdominal pain, nausea, vomiting, diarrhea, change in bowel habits, loss of appetite, bloody  stools.   Resp: + shortness of breath with exertion less at rest.  No excess mucus, no productive cough,  No non-productive cough,  No coughing up of blood.  No change in color of mucus.  No wheezing.  No chest wall deformity  Skin: no rash or lesions.  GU: no dysuria, change in color of urine, no urgency or frequency.  No flank pain, no hematuria   MS:  No joint pain or swelling.  No decreased range of motion.  No back pain.  Psych:  No change in mood or affect. No depression or anxiety.  No memory loss.   Vital Signs BP 112/72 (BP Location: Left Arm, Patient Position: Sitting, Cuff Size: Large)   Pulse 84   Ht 5\' 5"  (1.651 m)   Wt 263 lb 9.6 oz (119.6 kg)   SpO2 92%   BMI 43.87 kg/m    Physical Exam:  General- No distress,  A&Ox3 ENT: No sinus tenderness, TM clear, pale nasal mucosa, no oral exudate,no post nasal drip, no LAN Cardiac: S1, S2, regular rate and rhythm, no murmur Chest: No wheeze/ rales/ dullness; no accessory muscle use, no nasal flaring, no  sternal retractions, slightly diminished per bases Abd.: Soft Non-tender, nondistended, bowel sounds positive,Body mass index is 43.87 kg/m.  Ext: No clubbing cyanosis, positive left lower extremity edema 2+  Neuro: Physically deconditioned, moving all extremities x 4, alert and oriented x 3 Skin: No rashes, warm and dry, no obvious skin lesions Psych: normal mood and behavior   Assessment/Plan Pulmonary nodule and former smoker Plan I have ordered a CT chest to reevaluate the lung nodule and to see if there is anything on imaging that may explain your worsening shortness of breath. Follow-up within 1 week of reimaging to go over results  Dyspnea with exertion per patient worsening over the last several months Compliant with Trelegy daily Utilizing albuterol for breakthrough shortness of breath or wheezing Plan We have ordered Pulmonary Function tests to see if they have changed since the last time they were checked  in 2022.  We have ordered a heart echo to better evaluate if your shortness of breath is related to any change in your heart function.  This may also be a reason for worsening shortness of breath.  Continue Trelegy 1 puff once daily  Rinse mouth after use. Use albuterol as needed for shortness of breath or wheezing.  Follow up after PFT's and Echo with [name] Sago NP  OSA on CPAP Current CPAP machine is greater than 77 years old Patient requesting new CPAP machine Plan As unable to obtain a SIM card and get a download we will do a home sleep study to evaluate the degree of sleep apnea Based on these results we will be able to order a new CPAP machine through DME Follow up with Dr. Wynona [name] in 1-2 months  I spent 45 minutes dedicated to the care of this patient on the date of this encounter to include pre-visit review of records, face-to-face time with the patient discussing conditions above, post visit ordering of testing, clinical documentation with the electronic health record, making appropriate referrals as documented, and communicating necessary information to the patient's healthcare team.   Bevelyn Ngo, NP 03/07/2023  11:41 AM

## 2023-03-07 NOTE — Patient Instructions (Addendum)
It is good to see you today. We have ordered a home sleep study to evaluate the degree of your sleep apnea. This should help [place] to get you a new CPAP machine. We have ordered a heart echo to better evaluate if your shortness of breath is related to any change in your heart function.  We have ordered Pulmonary Function tests to see if they have changed since the last time they were checked in 2022.  This may also be a reason for worsening shortness of breath.  We have ordered a CT of the chest to re-evaluate the lung nodule as well as determine if there is anything new on imaging that may explain shortness of breath.  Continue Trelegy 1 puff once daily  Rinse mouth after use. Use albuterol as needed for shortness of breath or wheezing.  Follow up after PFT's and Echo with Maralyn Sago NP Follow up after home sleep study to review results. Please contact office for sooner follow up if symptoms do not improve or worsen or seek emergency care

## 2023-03-11 ENCOUNTER — Encounter: Payer: Self-pay | Admitting: Acute Care

## 2023-03-14 ENCOUNTER — Ambulatory Visit (HOSPITAL_BASED_OUTPATIENT_CLINIC_OR_DEPARTMENT_OTHER): Payer: Medicare HMO | Admitting: Orthopaedic Surgery

## 2023-03-14 ENCOUNTER — Ambulatory Visit (HOSPITAL_BASED_OUTPATIENT_CLINIC_OR_DEPARTMENT_OTHER): Payer: Medicare HMO

## 2023-03-14 DIAGNOSIS — M19012 Primary osteoarthritis, left shoulder: Secondary | ICD-10-CM

## 2023-03-14 DIAGNOSIS — M25512 Pain in left shoulder: Secondary | ICD-10-CM | POA: Diagnosis not present

## 2023-03-14 DIAGNOSIS — G8929 Other chronic pain: Secondary | ICD-10-CM

## 2023-03-14 MED ORDER — LIDOCAINE HCL 1 % IJ SOLN
4.0000 mL | INTRAMUSCULAR | Status: AC | PRN
  Administered 2023-03-14: 4 mL

## 2023-03-14 MED ORDER — TRIAMCINOLONE ACETONIDE 40 MG/ML IJ SUSP
80.0000 mg | INTRAMUSCULAR | Status: AC | PRN
  Administered 2023-03-14: 80 mg via INTRA_ARTICULAR

## 2023-03-14 NOTE — Progress Notes (Signed)
 Chief Complaint: Left shoulder pain     History of Present Illness:    [name] [name] is a 83 y.o. female presents today with continued left shoulder pain.  She has been having this for the last several years although this is worsening over the course of last several months.  She has had previous injections in the left shoulder.  She had this several months ago without any relief she has not had any previous physical therapy.  Her overhead motion has been quite limited and she is having a difficult time using the side to brush her hair.  She is right-hand dominant.    PMH/PSH/Family History/Social History/Meds/Allergies:    Past Medical History:  Diagnosis Date  . Allergy   . Anxiety   . Arthritis   . At risk for allergic reaction to medication 02/04/2021  . Blood transfusion without reported diagnosis   . 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 Candida infection 04/20/2021  . Carotid artery plaque, 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  . Cataract   . Closed fracture of upper end of humerus 11/03/2017  . COPD (chronic obstructive pulmonary disease) (HCC)   . Depression   . Diabetes 1.5, managed as type 2 (HCC)   . Encounter to establish care 06/03/2020  . Essential hypertension 05/19/2019  . Female stress incontinence 02/14/2018  . GERD (gastroesophageal reflux disease)   . History of opioid abuse (HCC) 05/19/2019  . Hyperglycemia 05/19/2019  . Hyperlipidemia   . Hypertension   . Menopause present 02/11/2018  . Need for shingles vaccine 07/01/2020  . Osteoporosis   . Sleep apnea   . 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 ABDOMINAL HYSTERECTOMY  1980  . BREAST EXCISIONAL BIOPSY     1990's  . CHOLECYSTECTOMY  1980  . MELANOMA EXCISION Right    Great Toe  . REPLACEMENT TOTAL KNEE Bilateral 2000  . sacral neuromodulation     Social History   Socioeconomic History  . Marital status: Widowed    Spouse name: Not on  file  . Number of children: 3  . Years of education: Not on file  . Highest education level: Not on file  Occupational History  . Occupation: Retired  Tobacco Use  . Smoking status: Former    Current packs/day: 0.00    Types: Cigarettes    Start date: 52    Quit date: 1988    Years since quitting: 37.1  . Smokeless tobacco: Never  Vaping Use  . Vaping status: Never Used  Substance and Sexual Activity  . Alcohol use: Not Currently  . Drug use: Never  . Sexual activity: Not Currently  Other Topics Concern  . Not on file  Social History Narrative  . Not on file   Social Drivers of Health   Financial Resource Strain: Low Risk  (03/28/2022)   Overall Financial Resource Strain (CARDIA)   . Difficulty of Paying Living Expenses: Not hard at all  Food Insecurity: No Food Insecurity (03/28/2022)   Hunger Vital Sign   . Worried About Programme Researcher, Broadcasting/film/video in the Last Year: Never true   . Ran Out of Food in the Last Year: Never true  Transportation Needs: No Transportation Needs (03/28/2022)   PRAPARE - Transportation   . Lack of Transportation (Medical): No   . Lack of Transportation (Non-Medical): No  Physical Activity: Inactive (03/28/2022)   Exercise Vital Sign   . Days of  Exercise per Week: 0 days   . Minutes of Exercise per Session: 0 min  Stress: No Stress Concern Present (03/28/2022)   [employer] of Occupational Health - Occupational Stress Questionnaire   . Feeling of Stress : Not at all  Social Connections: Unknown (11/30/2022)   Received from [facility]   Social Network   . Social Network: Not on file   Family History  Problem Relation Age of Onset  . Cancer Mother   . Intellectual disability Mother   . Cancer Father   . Early death Father   . Hypertension Father   . Breast cancer Sister   . Cancer Sister   . Arthritis Maternal Grandmother   . Asthma Paternal Grandmother   . Asthma Paternal Grandfather   . Colon cancer Neg Hx   . Stomach cancer Neg  Hx   . Esophageal cancer Neg Hx   . Colon polyps Neg Hx    Allergies  Allergen Reactions  . Azithromycin  Anaphylaxis and Hives  . Bee Venom Anaphylaxis  . Erythromycin Base Anaphylaxis  . Metronidazole Anaphylaxis  . Nitrofurantoin Macrocrystal Anaphylaxis  . Penicillins Anaphylaxis and Hives    childhood  . Sulfa Antibiotics Anaphylaxis and Hives    Childhood  . Cymbalta [Duloxetine Hcl]   . Ivp Dye [Iodinated Contrast Media]   . Penicillin G Hives  . Codeine Rash  . Erythromycin Rash and Hives  . Fosfomycin Rash  . Levofloxacin Rash  . Nitrofurantoin Hives  . Percocet [Oxycodone-Acetaminophen ] Rash   Current Outpatient Medications  Medication Sig Dispense Refill  . albuterol  (VENTOLIN  HFA) 108 (90 Base) MCG/ACT inhaler INHALE 1-2 PUFFS INTO THE LUNGS EVERY 4 HOURS AS NEEDED FOR WHEEZING OR SHORTNESS OF BREATH. 18 each 1  . ALPRAZolam  (XANAX ) 0.25 MG tablet TAKE (1) TABLET BY MOUTH TWICE DAILY AT 11AM & 5PM FOR ANXIETY. (Patient taking differently: Take 0.25 mg by mouth 2 (two) times daily. 1100 and 1500 And an extra 0.25mg  oral twice daily as needed for anxiety) 60 tablet 0  . AMBULATORY NON FORMULARY MEDICATION One four wheel rolling walker with seat and hand brakes. Brand/style covered by insurance. Medically necessary for ICD-10: R26.81, Z74.1 1 Units 0  . amLODIPine -Valsartan -HCTZ 10-320-25 MG TABS TAKE 1 TABLET BY MOUTH EVERY DAY 90 tablet 1  . ARTHRITIS PAIN RELIEVING 0.075 % topical cream Apply 1 application topically 3 (three) times daily as needed (neuropathic pain in feet).    . atorvastatin  (LIPITOR) 10 MG tablet TAKE 1 TABLET BY MOUTH EVERY DAY 90 tablet 1  . Bacillus Coagulans-Inulin (PROBIOTIC FORMULA) 1-250 BILLION-MG CAPS TAKE 1 CAPSULE (250MG ) BY MOUTH 2 TIMES A DAY. 60 capsule 11  . blood glucose meter kit and supplies KIT Meter, test strips, lancets, and alcohol swabs. Use for testing blood sugar every morning before meals and up to 3 additional times per day  as needed. Brand based on patient and insurance coverage. 100 strips with 99 refills, 100 lancets with 99 refills, 200 alcohol swabs with 99 refills. Dx: E11.65 1 each 99  . camphor-menthol (SARNA) lotion Apply 1 application topically as needed for itching. (Patient taking differently: Apply 1 application  topically 2 (two) times daily as needed for itching.) 222 mL 0  . carvedilol  (COREG ) 6.25 MG tablet TAKE 1 TABLET BY MOUTH 2 TIMES DAILY WITH A MEAL. 180 tablet 1  . carvedilol  (COREG ) 6.25 MG tablet Take 1 tablet (6.25 mg total) by mouth 2 (two) times daily with a meal. 60 tablet 3  .  cetirizine (ZYRTEC) 10 MG tablet TAKE 1 TABLET BY MOUTH EVERY DAY 90 tablet 3  . cholestyramine  (QUESTRAN ) 4 g packet TAKE 1 PACKET (4 G TOTAL) BY MOUTH 2 TIMES DAILY. 180 packet 0  . cloNIDine  (CATAPRES ) 0.1 MG tablet Take 1 tablet (0.1 mg total) by mouth 3 (three) times daily as needed (systolic blood pressurre above 190). 15 tablet 0  . colchicine -probenecid  0.5-500 MG tablet Take 1 tablet by mouth daily. 90 tablet 1  . diclofenac  Sodium (VOLTAREN  ARTHRITIS PAIN) 1 % GEL Apply 2 g topically 4 (four) times daily. 100 g 1  . doxycycline  (VIBRA -TABS) 100 MG tablet Take 1 tablet (100 mg total) by mouth 2 (two) times daily. 14 tablet 0  . EASYMAX TEST test strip     . famotidine  (PEPCID ) 20 MG tablet Take 1 tablet (20 mg total) by mouth daily as needed for heartburn or indigestion. 90 tablet 3  . FARXIGA  10 MG TABS tablet TAKE 1 TABLET BY MOUTH DAILY BEFORE BREAKFAST. 90 tablet 1  . ferrous sulfate  325 (65 FE) MG tablet TAKE 1 TABLET BY MOUTH EVERY DAY 90 tablet 2  . Fluticasone -Umeclidin-Vilant (TRELEGY ELLIPTA ) 100-62.5-25 MCG/ACT AEPB INHALE 1 PUFF INTO THE LUNGS DAILY. ** RINSE MOUTH AFTER USE** 60 each 0  . hydrALAZINE  (APRESOLINE ) 25 MG tablet TAKE 1 TABLET BY MOUTH TWICE A DAY 180 tablet 1  . Lactase 9000 units TABS Take one tablet with the first bite of meals containing milk or dairy products. (Patient taking  differently: Take 9,000 Units by mouth See admin instructions. Take 9000units with the first bite of meals containing milk or dairy products.) 90 tablet 11  . lidocaine  (LIDODERM ) 5 % Place 1 patch onto the skin daily. Remove & Discard patch within 12 hours or as directed by MD 9 patch 0  . methenamine  (HIPREX ) 1 g tablet Take 1 tablet (1 g total) by mouth 2 (two) times daily with a meal. 60 tablet 11  . mirabegron  ER (MYRBETRIQ ) 25 MG TB24 tablet Take 1 tablet (25 mg total) by mouth daily. 30 tablet 0  . PARoxetine  (PAXIL ) 40 MG tablet Take 40 mg by mouth daily.    . psyllium (METAMUCIL) 58.6 % packet Take 1 packet by mouth daily. 30 each 12  . YUVAFEM  10 MCG TABS vaginal tablet PLACE 1 TABLET VAGINALLY TWICE A WEEK 24 tablet 2   No current facility-administered medications for this visit.   No results found.  Review of Systems:   A ROS was performed including pertinent positives and negatives as documented in the HPI.  Physical Exam :   Constitutional: NAD and appears stated age Neurological: Alert and oriented Psych: Appropriate affect and cooperative There were no vitals taken for this visit.   Comprehensive Musculoskeletal Exam:    Left shoulder with active and passive forward elevation to 100 degrees.  External rotation at the side is to 20 degrees.  Internal rotation is to L5 compared to contralateral 160 degrees with external rotation at the side to 45.  Positive crepitus and pain about the left shoulder   Imaging:   Xray (3 views left shoulder): Severe left glenohumeral osteoarthritis    I personally reviewed and interpreted the radiographs.   Assessment and Plan:   83 y.o. female with severe left glenohumeral osteoarthritis.  At today's visit I discussed treatment options.  She is on multiple injections.  I did discuss the possibility of an additional injection although I was less hopeful about the longevity of this.  That being said she would like to proceed with this.   We did discuss the possibility of reverse shoulder arthroplasty which she would like to read more about.  I will plan to see her back as needed  -Left shoulder ultrasound-guided injection provided verbal consent obtained    Procedure Note  Patient: [name] [name]             Date of Birth: 05/13/1940           MRN: [id number]             Visit Date: 03/14/2023  Procedures: Visit Diagnoses:  1. Chronic left shoulder pain     Large Joint Inj: R glenohumeral on 03/14/2023 5:17 PM Indications: pain Details: 22 G 1.5 in needle, ultrasound-guided anterior approach  Arthrogram: No  Medications: 4 mL lidocaine  1 %; 80 mg triamcinolone  acetonide 40 MG/ML Outcome: tolerated well, no immediate complications Procedure, treatment alternatives, risks and benefits explained, specific risks discussed. Consent was given by the patient. Immediately prior to procedure a time out was called to verify the correct patient, procedure, equipment, support staff and site/side marked as required. Patient was prepped and draped in the usual sterile fashion.        I personally saw and evaluated the patient, and participated in the management and treatment plan.  Elspeth Parker, MD Attending Physician, Orthopedic Surgery  This document was dictated using Dragon voice recognition software. A reasonable attempt at proof reading has been made to minimize errors.

## 2023-03-15 NOTE — Progress Notes (Signed)
 Called 03/15/23 patients says she is having a allergic reaction to injection that was given in shoulder. Says benadryl  is helping

## 2023-03-16 ENCOUNTER — Telehealth: Payer: Self-pay | Admitting: Orthopaedic Surgery

## 2023-03-16 NOTE — Telephone Encounter (Signed)
 Patient called. Says she has broken out where the shot was given. Benadryl  isn't helping. Her cb # [phone]

## 2023-03-19 ENCOUNTER — Other Ambulatory Visit (HOSPITAL_BASED_OUTPATIENT_CLINIC_OR_DEPARTMENT_OTHER): Payer: Medicare HMO

## 2023-03-19 ENCOUNTER — Telehealth: Payer: Self-pay

## 2023-03-19 ENCOUNTER — Other Ambulatory Visit (HOSPITAL_BASED_OUTPATIENT_CLINIC_OR_DEPARTMENT_OTHER): Payer: Self-pay | Admitting: Orthopaedic Surgery

## 2023-03-19 MED ORDER — METHYLPREDNISOLONE 4 MG PO TBPK: ORAL_TABLET | ORAL | 0 refills | Status: DC

## 2023-03-19 NOTE — Telephone Encounter (Signed)
 Patient called stating that she had an allergic reaction to the injection that she received and that she has been itching and having some burning.  Stated that she has been taking benadryl , but it is not working.  Would like a call back at [phone].  Please advise.  Thank you

## 2023-03-20 DIAGNOSIS — G4733 Obstructive sleep apnea (adult) (pediatric): Secondary | ICD-10-CM

## 2023-03-20 NOTE — Telephone Encounter (Signed)
Tried calling pt unable to reach. LM ON Vm to advise following up from call. Advised of message below. Asked if she could give a call with an update on how she is doing and that I would forward her message to assistant for follow up.

## 2023-03-26 ENCOUNTER — Other Ambulatory Visit: Payer: Self-pay | Admitting: Acute Care

## 2023-03-27 ENCOUNTER — Ambulatory Visit: Payer: Medicare HMO | Admitting: Acute Care

## 2023-03-29 ENCOUNTER — Ambulatory Visit (HOSPITAL_COMMUNITY)
Admission: RE | Admit: 2023-03-29 | Discharge: 2023-03-29 | Disposition: A | Discharge status: Home / Self Care | Payer: Medicare HMO | Source: Ambulatory Visit | Attending: Cardiology | Admitting: Cardiology

## 2023-03-29 DIAGNOSIS — I6523 Occlusion and stenosis of bilateral carotid arteries: Secondary | ICD-10-CM | POA: Diagnosis present

## 2023-03-30 ENCOUNTER — Ambulatory Visit (HOSPITAL_BASED_OUTPATIENT_CLINIC_OR_DEPARTMENT_OTHER)
Admission: RE | Admit: 2023-03-30 | Discharge: 2023-03-30 | Disposition: A | Discharge status: Home / Self Care | Payer: Medicare HMO | Source: Ambulatory Visit | Attending: Acute Care | Admitting: Acute Care

## 2023-03-30 ENCOUNTER — Encounter: Payer: Self-pay | Admitting: *Deleted

## 2023-03-30 DIAGNOSIS — R911 Solitary pulmonary nodule: Secondary | ICD-10-CM | POA: Insufficient documentation

## 2023-04-01 NOTE — Progress Notes (Deleted)
 Cardiology Office Note:    Date:  04/01/2023   ID:  [name] [name], DOB [birth year], MRN [id number]  PCP:  Suzan Slick, MD  Cardiologist:  None  Electrophysiologist:  None   Referring MD: Suzan Slick, MD   No chief complaint on file.   History of Present Illness:    [name] [name] is a 83 y.o. female with a hx of melanoma, COPD, hypertension, hyperlipidemia, OSA who presents for follow-up.  She was referred by [name], PA for evaluation of hypertension, initially seen on 12/30/2019.  Previously followed with [name] in [place], [place].  Reports that had episode of syncope in 2019 and had loop recorder inserted.  Echocardiogram 03/18/2020 showed normal biventricular function, no significant valvular disease.  Lexiscan Myoview on 05/26/2020 showed normal perfusion, EF 61%.  Echocardiogram 08/01/2022 showed normal biventricular function, aortic valve sclerosis.  Since last clinic visit,  she reports she is doing well.  Denies any chest pain, dyspnea, lightheadedness, syncope,  or palpitations.  She has been wearing TED hose, which has kept LE edema under good control.  She is not exercising but is about to start physical therapy.  Reports compliance with CPAP.  No pain in legs with walking.   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   Cataract    Closed fracture of upper end of humerus 11/03/2017   COPD (chronic obstructive pulmonary disease) (HCC)    Depression    Diabetes 1.5, managed as type 2 (HCC)    Encounter to establish care 06/03/2020   Essential hypertension 05/19/2019   Female stress incontinence 02/14/2018   GERD (gastroesophageal reflux disease)    History of opioid abuse (HCC)  05/19/2019   Hyperglycemia 05/19/2019   Hyperlipidemia    Hypertension    Menopause present 02/11/2018   Need for shingles vaccine 07/01/2020   Osteoporosis    Sleep apnea    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     Allergies:   Azithromycin, Bee venom, Erythromycin base, Metronidazole, Nitrofurantoin macrocrystal, Penicillins, Sulfa antibiotics, Cymbalta [duloxetine hcl], Ivp dye [iodinated contrast media], Penicillin g, Codeine, Erythromycin, Fosfomycin, Levofloxacin, Nitrofurantoin, and Percocet [oxycodone-acetaminophen]   Social History   Socioeconomic History   Marital status: Widowed    Spouse name: Not on file   Number of children: 3   Years of education: Not on file   Highest education level: Not on file  Occupational History   Occupation: Retired  Tobacco Use   Smoking status: Former    Current packs/day: 0.00    Types: Cigarettes    Start date: 1957    Quit date: 1988    Years since quitting: 37.1   Smokeless tobacco: Never  Vaping Use   Vaping status: Never Used  Substance  and Sexual Activity   Alcohol use: Not Currently   Drug use: Never   Sexual activity: Not Currently  Other Topics Concern   Not on file  Social History Narrative   Not on file   Social Drivers of Health   Financial Resource Strain: Low Risk  (03/28/2022)   Overall Financial Resource Strain (CARDIA)    Difficulty of Paying Living Expenses: Not hard at all  Food Insecurity: No Food Insecurity (03/28/2022)   Hunger Vital Sign    Worried About Running Out of Food in the Last  Year: Never true    Ran Out of Food in the Last Year: Never true  Transportation Needs: No Transportation Needs (03/28/2022)   PRAPARE - Administrator, Civil Service (Medical): No    Lack of Transportation (Non-Medical): No  Physical Activity: Inactive (03/28/2022)   Exercise Vital Sign    Days of Exercise per Week: 0 days    Minutes of Exercise per Session: 0 min  Stress: No Stress Concern Present (03/28/2022)   [employer] of Occupational Health - Occupational Stress Questionnaire    Feeling of Stress : Not at all  Social Connections: Unknown (11/30/2022)   Received from [facility]   Social Network    Social Network: Not on file     Family History: The patient's family history includes Arthritis in her maternal grandmother; Asthma in her paternal grandfather and paternal grandmother; Breast cancer in her sister; Cancer in her father, mother, and sister; Early death in her father; Hypertension in her father; Intellectual disability in her mother. There is no history of Colon cancer, Stomach cancer, Esophageal cancer, or Colon polyps.  ROS:   Please see the history of present illness.    All other systems reviewed and are negative.  EKGs/Labs/Other Studies Reviewed:    The following studies were reviewed today:  Lexiscan Myoview 05/26/2020: The left ventricular ejection fraction is normal (55-65%). Nuclear stress EF: 61%. Blood pressure demonstrated a normal response to exercise. There was no ST segment deviation noted during stress. The study is normal. This is a low risk study.   Normal resting and stress perfusion. No ischemia or infarction EF 61%  Echo 03/18/2020: 1. Left ventricular ejection fraction, by estimation, is 60 to 65%. The  left ventricle has normal function. The left ventricle has no regional  wall motion abnormalities. There is mild concentric left ventricular  hypertrophy. Left ventricular diastolic  parameters are consistent with Grade  I diastolic dysfunction (impaired  relaxation). Elevated left ventricular end-diastolic pressure.   2. Right ventricular systolic function is normal. The right ventricular  size is normal. Tricuspid regurgitation signal is inadequate for assessing  PA pressure.   3. The mitral valve is normal in structure. No evidence of mitral valve  regurgitation. No evidence of mitral stenosis.   4. The aortic valve is tricuspid. There is moderate calcification of the  aortic valve. There is mild thickening of the aortic valve. Aortic valve  regurgitation is not visualized. Mild to moderate aortic valve  sclerosis/calcification is present, without  any evidence of aortic stenosis.   5. The inferior vena cava is normal in size with greater than 50%  respiratory variability, suggesting right atrial pressure of 3 mmHg.   [place] Lower Ext Art Seg Multi 01/22/2020: Right: Resting right ankle-brachial index indicates moderate right lower  extremity arterial disease.   Left: Resting left ankle-brachial index indicates mild left lower  extremity arterial disease. The left toe-brachial index is abnormal.  [place] LE Arterial Duplex 01/22/2020: Right: 50-74% stenosis noted in the common femoral artery. 30-49% stenosis  noted in the superficial femoral artery and/or popliteal artery.  Atherosclerosis in the common femoral, femoral, and popliteal and tibial  arteries.      Left: 50-74% stenosis noted in the common femoral artery. 30-49% stenosis  noted in the superficial femoral artery and/or popliteal artery.  Atherosclerosis in the common femoral, femoral, and popliteal and tibial  arteries.   EKG:   03/22/22: Normal sinus rhythm, rate 78, LVH with repolarization abnormalities, left axis deviation, poor R wave progression 07/12/2020: NSR, rate 92, poor R wave progression, LAD 04/06/2020: EKG was not ordered. 12/29/2019: normal sinus rhythm, rate 98, poor R wave progression, left axis deviation, T wave inversions in  leads I/aVL  Recent Labs: 10/18/2022: ALT 18; BUN 31; Creatinine, Ser 1.34; Potassium 4.2; Sodium 144  Recent Lipid Panel    Component Value Date/Time   CHOL 122 07/05/2021 1021   TRIG 207 (H) 07/05/2021 1021   HDL 39 (L) 07/05/2021 1021   CHOLHDL 3.1 07/05/2021 1021   LDLCALC 50 07/05/2021 1021    Physical Exam:    VS:  There were no vitals taken for this visit.    Wt Readings from Last 3 Encounters:  03/07/23 263 lb 9.6 oz (119.6 kg)  03/01/23 260 lb 9.6 oz (118.2 kg)  12/12/22 262 lb (118.8 kg)     GEN:  Well nourished, well developed in no acute distress HEENT: Normal NECK: No JVD; + left carotid bruit CARDIAC: RRR, 2 out of 6 systolic murmur RESPIRATORY:  Clear to auscultation without rales, wheezing or rhonchi  ABDOMEN: Soft, non-tender, non-distended MUSCULOSKELETAL:  No edema; No deformity  SKIN: Warm and dry NEUROLOGIC:  Alert and oriented x 3 PSYCHIATRIC:  Normal affect   ASSESSMENT:    No diagnosis found.  PLAN:    Chest pain/dyspnea: Reports atypical chest pain but also having dyspnea with minimal exertion, could represent anginal equivalent.  She does have CAD risk factors including hypertension and hyperlipidemia, and has known PAD.  Echocardiogram 03/18/2020 showed normal biventricular function, no significant valvular disease.  Lexiscan Myoview on 05/26/2020 showed normal perfusion, EF 61%. -Reports no recent chest pain or dyspnea  Syncope: Reports syncopal episode in 2019, had loop recorder placed in [place].  Established with device clinic for loop interrogations, have been unremarkable.  Reported no further syncopal episodes.  Device was turned off  Aortic sclerosis: Echocardiogram 08/01/2022 showed normal biventricular function, aortic valve sclerosis; may be more likely mild aortic stenosis (Vmax 2.1 m/s, MG ), will monitor  Carotid stenosis: 40 to 59% stenosis in left ICA, 1 to 39% stenosis in right ICA on duplex 03/2023.  Will monitor.  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  On Eliquis.  Hypertension: On amlodipine 10 mg-valsartan 320 mg-HCTZ 25 mg, carvedilol 6.25 mg twice daily, hydralazine 25 mg twice daily.  Also was prescribed as needed clonidine.  BP appears reasonably controlled  Hyperlipidemia: On atorvastatin 10 mg daily.  LDL 50 on 07/05/2021  PAD: ABIs show moderate disease on right (0.74) and mild left (0.89).  Duplex shows 50 to 74% stenosis in common femoral artery bilaterally, 30 to 49% stenosis in SFA.  Denies any symptoms of claudication -Continue Eliquis, statin   OSA: on CPAP, reports compliance  RTC in 1 year***   Medication Adjustments/Labs and Tests Ordered: Current medicines are reviewed at length with the patient today.  Concerns regarding medicines  are outlined above.  No orders of the defined types were placed in this encounter.  No orders of the defined types were placed in this encounter.   There are no Patient Instructions on file for this visit.   Signed, Little Ishikawa, MD  04/01/2023 2:42 PM    [facility]

## 2023-04-02 ENCOUNTER — Telehealth: Payer: Self-pay | Admitting: Physician Assistant

## 2023-04-02 MED ORDER — CHOLESTYRAMINE 4 G PO PACK: 4.0000 g | PACK | Freq: Two times a day (BID) | ORAL | 0 refills | Status: DC

## 2023-04-02 NOTE — Telephone Encounter (Signed)
 Sent script for pharmacy.

## 2023-04-02 NOTE — Telephone Encounter (Signed)
 Patient called and stated that she is needing a refill on the medication QUESTRAN 4 G packet until she is able to be seen by Hyacinth Meeker. Please advise.

## 2023-04-03 ENCOUNTER — Encounter (HOSPITAL_BASED_OUTPATIENT_CLINIC_OR_DEPARTMENT_OTHER): Payer: Medicare HMO

## 2023-04-03 ENCOUNTER — Encounter: Payer: Medicare HMO | Admitting: Family Medicine

## 2023-04-03 ENCOUNTER — Ambulatory Visit: Payer: Medicare HMO | Admitting: Cardiology

## 2023-04-04 ENCOUNTER — Ambulatory Visit (INDEPENDENT_AMBULATORY_CARE_PROVIDER_SITE_OTHER): Payer: Medicare HMO | Admitting: Acute Care

## 2023-04-04 ENCOUNTER — Telehealth: Payer: Self-pay | Admitting: Pulmonary Disease

## 2023-04-04 ENCOUNTER — Encounter: Payer: Self-pay | Admitting: Acute Care

## 2023-04-04 VITALS — BP 124/60 | HR 82 | Temp 98.6°F | Ht 64.0 in | Wt 256.8 lb

## 2023-04-04 DIAGNOSIS — G4733 Obstructive sleep apnea (adult) (pediatric): Secondary | ICD-10-CM | POA: Diagnosis not present

## 2023-04-04 DIAGNOSIS — R918 Other nonspecific abnormal finding of lung field: Secondary | ICD-10-CM | POA: Diagnosis not present

## 2023-04-04 DIAGNOSIS — R0683 Snoring: Secondary | ICD-10-CM | POA: Diagnosis not present

## 2023-04-04 NOTE — Progress Notes (Signed)
 History of Present Illness [name] [name] is a 83 y.o. female former smoker. PMH significant for COPD, OSA, HTN, CAD, GERD, bipolar disease, chronic pain syndrome. Patient of Dr. Tonia Brooms, last seen by him  on 12/29/2022   Synopsis This is a 83 year old female, patient of Dr. Wynona Neat, referred for right lower lobe pulmonary nodule. She is a former smoker quit in 1988. She had a CT scan of the chest completed in November 2020 that is visible in PACS. This showed a 4 mm right lower lobe pulmonary nodule. She had CT scan follow-up that was completed. CT follow-up imaging was completed on 07/22/2020. This CT scan of the chest revealed a 7 mm right lower lobe pulmonary nodule that was previously 4 mm and had increased in size and now has slight spiculated margins. She had this followed by a nuclear medicine pet image on 08/19/2020. Nuclear medicine pet imaging shows no significant hypermetabolism however the lesion itself is smaller than the lower limit for size related to PET scans. Dr. Tonia Brooms has been doing annual surveillance imaging to ensure stability of the right upper lobe of concern.  Patient was scheduled for a January 2025 follow-up scan, which she has not had done. She is here today to review the 03/2023 scan.   04/04/2023 Pt. Presents for follow up after CT Chest as surveillance of an 8 mm   posterior right lower lobe nodule. She is a former smoker .  We have reviewed the scan together. There has been some mild progression of the nodule from 7 mm to 8 mm since her 02/2022 scan. We will do a 6 month follow up, if there is continued progression we will PET scan at her follow up. Pt. Is in agreement with this plan.   Pt. Has had a sleep study done and wanted to know her results. Ir has not been read. We will get her scheduled for follow up with Dr. Wynona Neat or one of the NP's to review results. She needs a new CPAP device, and needed a repeat sleep test to qualify her for it.   She is also having  surgery on her shoulder in the next several months. She will need surgical clearance closer to the time of the procedure.   Test Results: 03/30/2023 CT Chest Lungs/Pleura: Mild nodular scarring in the subpleural right upper lobe (series 4/image 73), without discrete residual nodule.   2 mm subpleural nodule in the posterior right lower lobe (series 4/image 96), unchanged, benign.   8 mm subpleural nodule in the posterior right lower lobe (series 4/image 109), previously 7 mm. This is mildly progressive from 2020.   No focal consolidation.   No pleural effusion or pneumothorax.   Upper Abdomen: Visualized upper abdomen is grossly unremarkable, noting prior cholecystectomy and vascular calcifications.   Musculoskeletal: Degenerative changes of the visualized thoracolumbar spine.   IMPRESSION: 8 mm subpleural nodule in the posterior right lower lobe, mildly progressive from 2020. While a benign etiology is favored, follow-up CT chest is suggested in 12 months.    Latest Ref Rng & Units 07/05/2021   10:21 AM 03/24/2021    1:23 PM 03/13/2021    2:21 AM  CBC  WBC 3.4 - 10.8 x10E3/uL 8.7  11.3  11.8   Hemoglobin 11.1 - 15.9 g/dL 16.1  8.9  8.2   Hematocrit 34.0 - 46.6 % 41.0  30.4  28.5   Platelets 150 - 450 x10E3/uL 275  348  349  Latest Ref Rng & Units 10/18/2022   11:45 AM 11/01/2021   11:26 AM 11/01/2021   11:25 AM  BMP  Glucose 70 - 99 mg/dL 161  096  045   BUN 8 - 27 mg/dL 31  14  16    Creatinine 0.57 - 1.00 mg/dL 4.09  8.11  9.14   BUN/Creat Ratio 12 - 28 23  14  16    Sodium 134 - 144 mmol/L 144  142  142   Potassium 3.5 - 5.2 mmol/L 4.2  4.5  4.4   Chloride 96 - 106 mmol/L 106  105  104   CO2 20 - 29 mmol/L 21  22  23    Calcium 8.7 - 10.3 mg/dL 9.3  9.6  9.6     BNP    Component Value Date/Time   BNP 20.9 11/01/2021 1126   BNP 80.3 03/13/2021 0221    ProBNP No results found for: "PROBNP"  PFT    Component Value Date/Time   FEV1PRE 1.39 09/02/2020  1123   FEV1POST 1.52 09/02/2020 1123   FVCPRE 1.73 09/02/2020 1123   FVCPOST 1.81 09/02/2020 1123   TLC 5.24 09/02/2020 1123   DLCOUNC 14.28 09/02/2020 1123   PREFEV1FVCRT 80 09/02/2020 1123   PSTFEV1FVCRT 84 09/02/2020 1123    CT CHEST WO CONTRAST Result Date: 04/03/2023 CLINICAL DATA:  Follow-up pulmonary nodule EXAM: CT CHEST WITHOUT CONTRAST TECHNIQUE: Multidetector CT imaging of the chest was performed following the standard protocol without IV contrast. RADIATION DOSE REDUCTION: This exam was performed according to the departmental dose-optimization program which includes automated exposure control, adjustment of the mA and/or kV according to patient size and/or use of iterative reconstruction technique. COMPARISON:  02/24/2022 FINDINGS: Cardiovascular: The heart is top-normal in size. No pericardial effusion. No evidence of thoracic aortic aneurysm. Atherosclerotic calcifications of the aortic arch. Moderate three-vessel coronary atherosclerosis. Mediastinum/Nodes: No suspicious mediastinal lymphadenopathy. Visualized thyroid is unremarkable. Lungs/Pleura: Mild nodular scarring in the subpleural right upper lobe (series 4/image 73), without discrete residual nodule. 2 mm subpleural nodule in the posterior right lower lobe (series 4/image 96), unchanged, benign. 8 mm subpleural nodule in the posterior right lower lobe (series 4/image 109), previously 7 mm. This is mildly progressive from 2020. No focal consolidation. No pleural effusion or pneumothorax. Upper Abdomen: Visualized upper abdomen is grossly unremarkable, noting prior cholecystectomy and vascular calcifications. Musculoskeletal: Degenerative changes of the visualized thoracolumbar spine. IMPRESSION: 8 mm subpleural nodule in the posterior right lower lobe, mildly progressive from 2020. While a benign etiology is favored, follow-up CT chest is suggested in 12 months. Aortic Atherosclerosis (ICD10-I70.0). Electronically Signed   By:  Charline Bills M.D.   On: 04/03/2023 20:46   VAS US CAROTID Result Date: 03/30/2023 Carotid Arterial Duplex Study Patient Name:  KATELIN KUTSCH  Date of Exam:   03/29/2023 Medical Rec #: [id number]         Accession #:    [id number] Date of Birth: 14-Mar-1940        Patient Gender: F Patient Age:   33 years Exam Location:  [facility] Procedure:      VAS US CAROTID Referring Phys: Epifanio Lesches --------------------------------------------------------------------------------  Indications:       Carotid artery disease and patient denies any cerebrovascular                    symptoms. Risk Factors:      Hypertension, hyperlipidemia, Diabetes, past history of  smoking, coronary artery disease, PAD. Other Factors:     TIA. Limitations        Today's exam was limited due to the high bifurcation of the                    carotid and the body habitus of the patient. Comparison Study:  In 04/2022, a carotid duplex showed velocities of 85/20 cm/s                    in the RICA and 315/54 cm/s in the LICA. Performing Technologist: Tyna Jaksch RVT  Examination Guidelines: A complete evaluation includes B-mode imaging, spectral Doppler, color Doppler, and power Doppler as needed of all accessible portions of each vessel. Bilateral testing is considered an integral part of a complete examination. Limited examinations for reoccurring indications may be performed as noted.  Right Carotid Findings: +----------+--------+--------+--------+------------------+--------+           PSV cm/sEDV cm/sStenosisPlaque DescriptionComments +----------+--------+--------+--------+------------------+--------+ CCA Prox  139     11                                         +----------+--------+--------+--------+------------------+--------+ CCA Distal73      10      <50%    heterogenous               +----------+--------+--------+--------+------------------+--------+ ICA Prox  92      19      1-39%    hyperechoic                +----------+--------+--------+--------+------------------+--------+ ICA Distal63      11                                tortuous +----------+--------+--------+--------+------------------+--------+ ECA       203     9       >50%    heterogenous               +----------+--------+--------+--------+------------------+--------+ +----------+--------+-------+---------+-------------------+           PSV cm/sEDV cmsDescribe Arm Pressure (mmHG) +----------+--------+-------+---------+-------------------+ [id number]            Turbulent                    +----------+--------+-------+---------+-------------------+ +---------+--------+--+--------+-+---------+ VertebralPSV cm/s49EDV cm/s6Antegrade +---------+--------+--+--------+-+---------+  Left Carotid Findings: +----------+--------+--------+--------+------------------+--------+           PSV cm/sEDV cm/sStenosisPlaque DescriptionComments +----------+--------+--------+--------+------------------+--------+ CCA Prox  33      6                                          +----------+--------+--------+--------+------------------+--------+ CCA Distal45      8       <50%    heterogenous               +----------+--------+--------+--------+------------------+--------+ ICA Prox  147     28              heterogenous               +----------+--------+--------+--------+------------------+--------+ ICA Mid   252     49      40-59%  heterogenous               +----------+--------+--------+--------+------------------+--------+  ICA Distal82      18                                tortuous +----------+--------+--------+--------+------------------+--------+ ECA       434     57      >50%    heterogenous               +----------+--------+--------+--------+------------------+--------+ +----------+--------+--------+---------+-------------------+           PSV cm/sEDV cm/sDescribe  Arm Pressure (mmHG) +----------+--------+--------+---------+-------------------+ Subclavian170             Turbulent                    +----------+--------+--------+---------+-------------------+ +---------+--------+--+--------+-+---------+ VertebralPSV cm/s49EDV cm/s8Antegrade +---------+--------+--+--------+-+---------+   Summary: Right Carotid: Velocities in the right ICA are consistent with a 1-39% stenosis.                Non-hemodynamically significant plaque <50% noted in the CCA. The                ECA appears >50% stenosed. Left Carotid: Velocities in the left ICA are consistent with a 40-59% stenosis.               Non-hemodynamically significant plaque <50% noted in the CCA. The               ECA appears >50% stenosed. Vertebrals:  Bilateral vertebral arteries demonstrate antegrade flow. Subclavians: Bilateral subclavian artery flow was disturbed. *See table(s) above for measurements and observations. Suggest follow up study in 12 months. Electronically signed by Lorine Bears MD on 03/30/2023 at 8:23:21 AM.    Final    DG Shoulder Left Result Date: 03/28/2023 CLINICAL DATA:  pain EXAM: LEFT SHOULDER - 2+ VIEW COMPARISON:  None Available. FINDINGS: There is no evidence of fracture or dislocation. Severe degenerative changes of the glenohumeral and acromioclavicular joint. Soft tissues are unremarkable. IMPRESSION: Severe degenerative changes of the glenohumeral and acromioclavicular joint. Electronically Signed   By: Tish Frederickson M.D.   On: 03/28/2023 23:58     Past medical hx 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   Cataract    Closed fracture of upper end of humerus 11/03/2017   COPD (chronic obstructive  pulmonary disease) (HCC)    Depression    Diabetes 1.5, managed as type 2 (HCC)    Encounter to establish care 06/03/2020   Essential hypertension 05/19/2019   Female stress incontinence 02/14/2018   GERD (gastroesophageal reflux disease)    History of opioid abuse (HCC) 05/19/2019   Hyperglycemia 05/19/2019   Hyperlipidemia    Hypertension    Menopause present 02/11/2018   Need for shingles vaccine 07/01/2020   Osteoporosis    Sleep apnea    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 Her smoking use included cigarettes. She started smoking about 68 years ago. She has never used smokeless tobacco. She reports that she does not currently use alcohol. She reports that she does not use drugs.  Tobacco Cessation: Former smoker , Quit  1988 with a 31 pack year smoking history.   Past surgical hx, Family hx, Social hx all reviewed.  Current Outpatient Medications on File Prior to Visit  Medication Sig   albuterol (VENTOLIN HFA) 108 (90 Base) MCG/ACT inhaler INHALE 1-2 PUFFS INTO THE LUNGS EVERY 4 HOURS AS NEEDED FOR WHEEZING OR SHORTNESS OF BREATH.   ALPRAZolam (XANAX) 0.25 MG tablet TAKE (1) TABLET BY MOUTH TWICE DAILY AT 11AM & 5PM FOR ANXIETY. (Patient taking differently: Take 0.25 mg by mouth 2 (two) times daily. 1100 and 1500 And an extra 0.25mg  oral twice daily as needed for anxiety)   AMBULATORY NON FORMULARY MEDICATION One four wheel rolling walker with seat and hand brakes. Brand/style covered by insurance. Medically necessary for ICD-10: R26.81, Z74.1   amLODIPine-Valsartan-HCTZ 10-320-25 MG  TABS TAKE 1 TABLET BY MOUTH EVERY DAY   ARTHRITIS PAIN RELIEVING 0.075 % topical cream Apply 1 application topically 3 (three) times daily as needed (neuropathic pain in feet).   atorvastatin (LIPITOR) 10 MG tablet TAKE 1 TABLET BY MOUTH EVERY DAY   Bacillus Coagulans-Inulin (PROBIOTIC FORMULA) 1-250 BILLION-MG CAPS TAKE 1 CAPSULE (250MG ) BY MOUTH 2 TIMES A DAY.   blood glucose meter kit and supplies KIT Meter, test strips, lancets, and alcohol swabs. Use for testing blood sugar every morning before meals and up to 3 additional times per day as needed. Brand based on patient and insurance coverage. 100 strips with 99 refills, 100 lancets with 99 refills, 200 alcohol swabs with 99 refills. Dx: E11.65   camphor-menthol (SARNA) lotion Apply 1 application topically as needed for itching. (Patient taking differently: Apply 1 application  topically 2 (two) times daily as needed for itching.)   carvedilol (COREG) 6.25 MG tablet Take 1 tablet (6.25 mg total) by mouth 2 (two) times daily with a meal.   cetirizine (ZYRTEC) 10 MG tablet TAKE 1 TABLET BY MOUTH EVERY DAY   cholestyramine (QUESTRAN) 4 g packet Take 1 packet (4 g total) by mouth 2 (two) times daily.   cloNIDine (CATAPRES) 0.1 MG tablet Take 1 tablet (0.1 mg total) by mouth 3 (three) times daily as needed (systolic blood pressurre above 190).   colchicine-probenecid 0.5-500 MG tablet Take 1 tablet by mouth daily.   diclofenac Sodium (VOLTAREN ARTHRITIS PAIN) 1 % GEL Apply 2 g topically 4 (four) times daily.   EASYMAX TEST test strip    famotidine (PEPCID) 20 MG tablet Take 1 tablet (20 mg total) by mouth daily as needed for heartburn or indigestion.   FARXIGA 10 MG TABS tablet TAKE 1 TABLET BY MOUTH DAILY BEFORE BREAKFAST.   ferrous sulfate 325 (65 FE) MG tablet TAKE 1 TABLET BY MOUTH EVERY DAY   hydrALAZINE (APRESOLINE) 25 MG tablet TAKE 1 TABLET BY MOUTH TWICE A DAY   Lactase 9000 units TABS Take one tablet with the first bite of meals containing  milk or dairy products. (Patient taking differently: Take 9,000 Units by mouth See admin instructions. Take 9000units with the first bite of meals containing milk or dairy products.)   lidocaine (LIDODERM) 5 % Place 1 patch onto  the skin daily. Remove & Discard patch within 12 hours or as directed by MD   methenamine (HIPREX) 1 g tablet Take 1 tablet (1 g total) by mouth 2 (two) times daily with a meal.   mirabegron ER (MYRBETRIQ) 25 MG TB24 tablet Take 1 tablet (25 mg total) by mouth daily.   PARoxetine (PAXIL) 40 MG tablet Take 40 mg by mouth daily.   psyllium (METAMUCIL) 58.6 % packet Take 1 packet by mouth daily.   TRELEGY ELLIPTA 100-62.5-25 MCG/ACT AEPB INHALE 1 PUFF BY MOUTH INTO THE LUNGS DAILY. ** RINSE MOUTH AFTER USE**   YUVAFEM 10 MCG TABS vaginal tablet PLACE 1 TABLET VAGINALLY TWICE A WEEK   carvedilol (COREG) 6.25 MG tablet TAKE 1 TABLET BY MOUTH 2 TIMES DAILY WITH A MEAL.   doxycycline (VIBRA-TABS) 100 MG tablet Take 1 tablet (100 mg total) by mouth 2 (two) times daily. (Patient not taking: Reported on 04/04/2023)   methylPREDNISolone (MEDROL DOSEPAK) 4 MG TBPK tablet Take per packet instructions (Patient not taking: Reported on 04/04/2023)   No current facility-administered medications on file prior to visit.     Allergies  Allergen Reactions   Azithromycin Anaphylaxis and Hives   Bee Venom Anaphylaxis   Erythromycin Base Anaphylaxis   Metronidazole Anaphylaxis   Nitrofurantoin Macrocrystal Anaphylaxis   Penicillins Anaphylaxis and Hives    childhood   Sulfa Antibiotics Anaphylaxis and Hives    Childhood   Cymbalta [Duloxetine Hcl]    Ivp Dye [Iodinated Contrast Media]    Penicillin G Hives   Codeine Rash   Erythromycin Rash and Hives   Fosfomycin Rash   Levofloxacin Rash   Nitrofurantoin Hives   Percocet [Oxycodone-Acetaminophen] Rash    Review Of Systems:  Constitutional:   No  weight loss, night sweats,  Fevers, chills, fatigue, or  lassitude.  HEENT:   No  headaches,  Difficulty swallowing,  Tooth/dental problems, or  Sore throat,                No sneezing, itching, ear ache, nasal congestion, post nasal drip,   CV:  No chest pain,  Orthopnea, PND, swelling in lower extremities, anasarca, dizziness, palpitations, syncope.   GI  No heartburn, indigestion, abdominal pain, nausea, vomiting, diarrhea, change in bowel habits, loss of appetite, bloody stools.   Resp: + shortness of breath with exertion less at rest.  No excess mucus, no productive cough,  No non-productive cough,  No coughing up of blood.  No change in color of mucus.  No wheezing.  No chest wall deformity  Skin: no rash or lesions.  GU: no dysuria, change in color of urine, no urgency or frequency.  No flank pain, no hematuria   MS:  No joint pain or swelling.  No decreased range of motion.  No back pain.  Psych:  No change in mood or affect. No depression or anxiety.  No memory loss.   Vital Signs BP 124/60 (BP Location: Right Arm, Patient Position: Sitting, Cuff Size: Large)   Pulse 82   Temp 98.6 F (37 C) (Oral)   Ht 5\' 4"  (1.626 m)   Wt 256 lb 12.8 oz (116.5 kg)   SpO2 97%   BMI 44.08 kg/m    Physical Exam:  General- No distress,  A&Ox3, pleasant, in electric wheelchair ENT: No sinus tenderness, TM clear, pale nasal mucosa, no oral exudate,no post nasal drip, no LAN Cardiac: S1, S2, regular rate and rhythm, no murmur Chest: No wheeze/ rales/ dullness; no accessory  muscle use, no nasal flaring, no sternal retractions, diminished per bases Abd.: Soft Non-tender, ND, BS+, Body mass index is 44.08 kg/m.  Ext: No clubbing cyanosis, edema, no obvious deformities Neuro:  normal strength, MAE x 4, A&O x 3, appropriate Skin: No rashes, warm and dry,no obvious lesions  Psych: normal mood and behavior   Assessment/Plan Mildly progressive pulmonary lung nodule, now measures 8 mm Former smoker  Needs follow up for sleep study/ new CPAP machine Will need surgical  clearance for shoulder surgery closer to the time of surgery Plan We have reviewed your CT Chest , which shows the nodule we have been watching in the right lower lobe has had some mild progression since last checked in 02/2022.  Plan will be for a 6 month follow up Ct chest , which will be due 09/2023. You will get a call to get this scheduled closer to the time . Please schedule with Dr. Wynona Neat or NP as follow up sleep study ( Video visit to review sleep study results)  She would like a new CPAP machine. Continue Trelegy 1 puff once daily. Rinse mouth after . Call closer to the time of your shoulder surgery and we will do a surgical clearance for you at that time. Please contact office for sooner follow up if symptoms do not improve or worsen or seek emergency care     I spent 30 minutes dedicated to the care of this patient on the date of this encounter to include pre-visit review of records, face-to-face time with the patient discussing conditions above, post visit ordering of testing, clinical documentation with the electronic health record, making appropriate referrals as documented, and communicating necessary information to the patient's healthcare team.   Bevelyn Ngo, NP 04/04/2023  11:47 AM

## 2023-04-04 NOTE — Telephone Encounter (Signed)
 Call patient  Sleep study result  Date of study: 03/20/2023  Impression: Negative study for significant sleep disordered breathing with an AHI of 3.8, oxygen nadir of 78%, oxygen saturations was below 88% for 164 minutes  Recommendation:  Will recommend in-lab study to ascertain presence/absence of significant obstructive sleep events  Further evaluation of oxygen saturations recommended.  If patient agreeable, schedule for in-lab sleep study  Clinical follow-up, further discussions regarding further evaluation recommended

## 2023-04-04 NOTE — Patient Instructions (Addendum)
 It is good to see you today. We have reviewed your CT Chest , which shows the nodule we have been watching in the right lower lobe has had some mild progression since last checked in 02/2022.  Plan will be for a 6 month follow up Ct chest , which will be due 09/2023. You will get a call to get this scheduled closer to the time . Please schedule with Dr. Wynona Neat or NP as follow up sleep study ( Video visit to review sleep study results)  She would like a new CPAP machine. Continue Trelegy 1 puff once daily. Rinse mouth after . Call closer to the time of your shoulder surgery and we will do a surgical clearance for you at that time. Please contact office for sooner follow up if symptoms do not improve or worsen or seek emergency care

## 2023-04-06 ENCOUNTER — Encounter: Payer: Medicare HMO | Admitting: Family Medicine

## 2023-04-08 ENCOUNTER — Other Ambulatory Visit (HOSPITAL_BASED_OUTPATIENT_CLINIC_OR_DEPARTMENT_OTHER): Payer: Self-pay | Admitting: Family Medicine

## 2023-04-08 DIAGNOSIS — K219 Gastro-esophageal reflux disease without esophagitis: Secondary | ICD-10-CM

## 2023-04-09 NOTE — Telephone Encounter (Signed)
 Called and spoke with patient, provided results/recommendations per Dr. Wynona Neat.  She verbalized understanding and was agreeable to the in lab sleep study being ordered.  Advised that once insurance approves the study, the sleep lab will call to schedule the test.  Nothing further needed.

## 2023-04-10 IMAGING — CT CT CHEST W/O CM
2 of 4 series · 15 of 36 positions shown, 18 images · non-contrast
Comparison: 12/17/2018 outside exam

CLINICAL DATA: Follow-up pulmonary nodule in LEFT lower lobe
atelectasis

EXAM:
CT CHEST WITHOUT CONTRAST
TECHNIQUE: Multidetector CT imaging of the chest was performed following the
standard protocol without IV contrast. Sagittal and coronal MPR
images reconstructed from axial data set.

[Series 2: thorax · axial · 0.80mm/px · z∈[-320,-68]mm · 12 of 150 slices shown, 15 images]
[im 12/150  mediastinal]
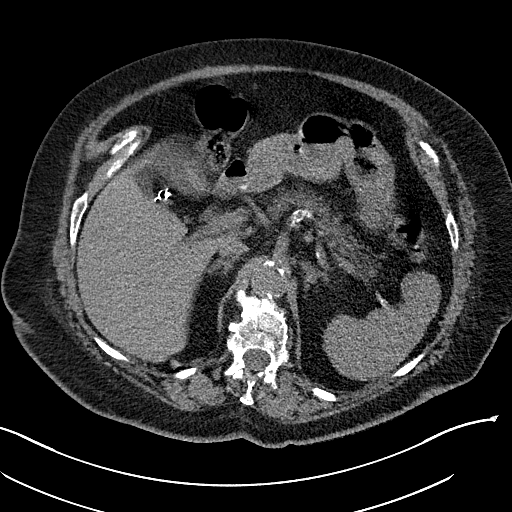
[im 12/150  lung]
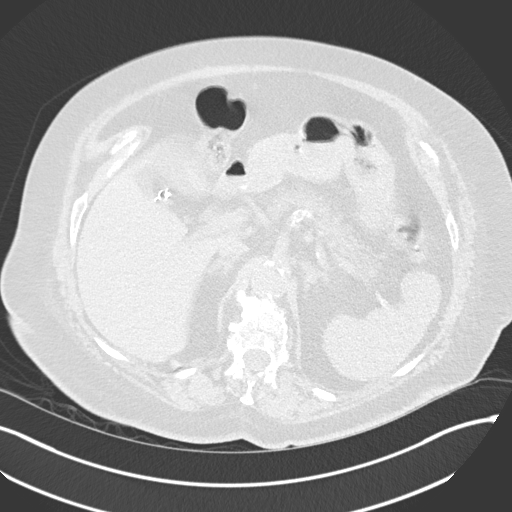
[im 23/150  lung]
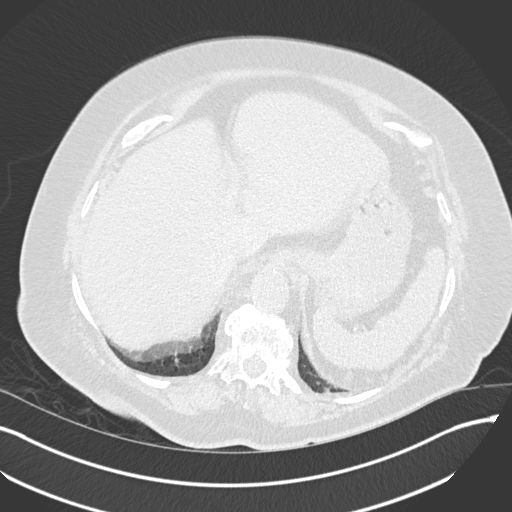
[im 35/150  lung]
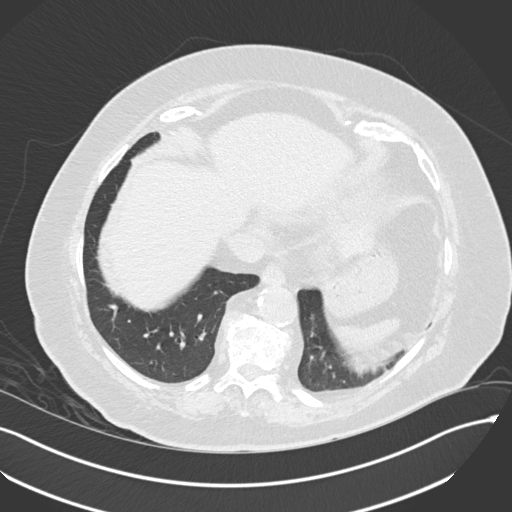
[im 46/150  lung]
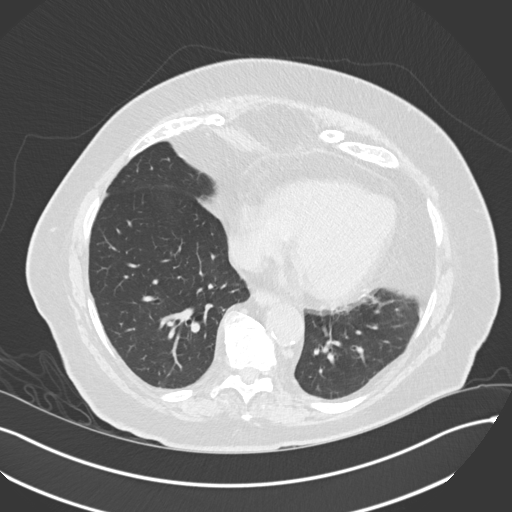
[im 58/150  mediastinal]
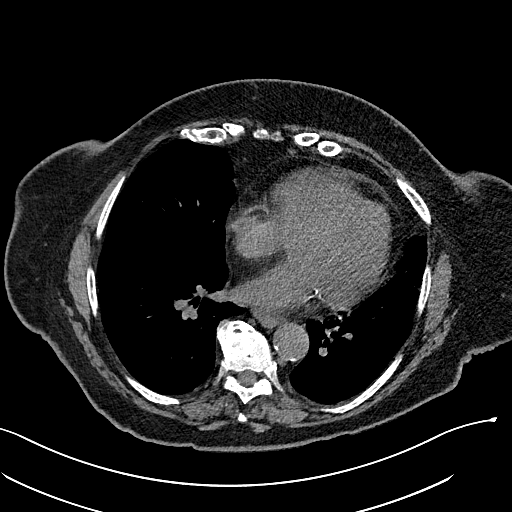
[im 58/150  lung]
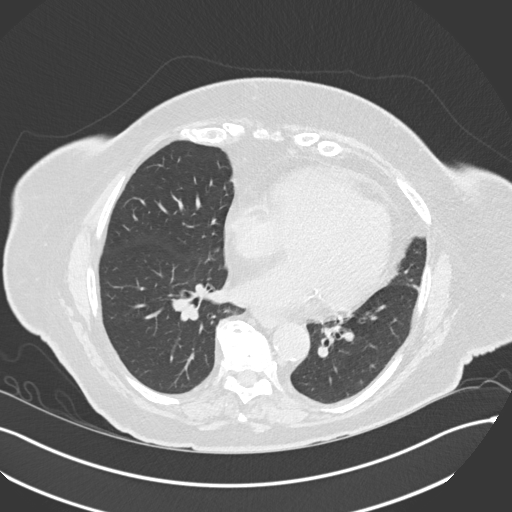
[im 69/150  lung]
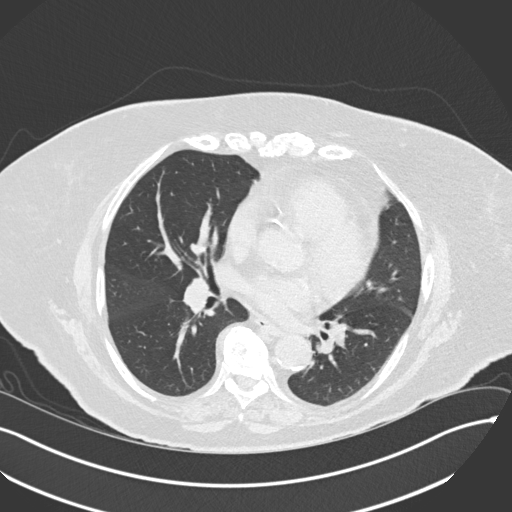
[im 81/150  lung]
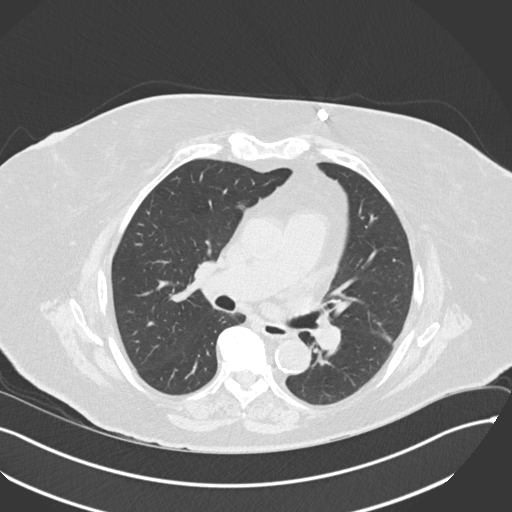
[im 92/150  lung]
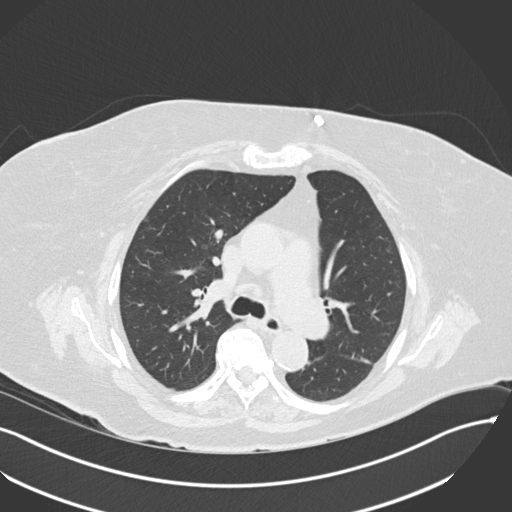
[im 104/150  mediastinal]
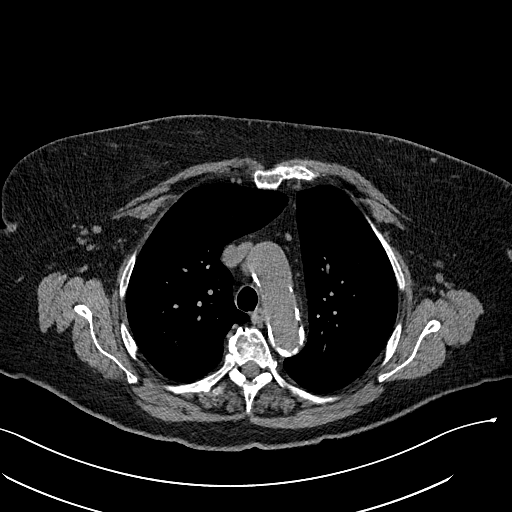
[im 104/150  lung]
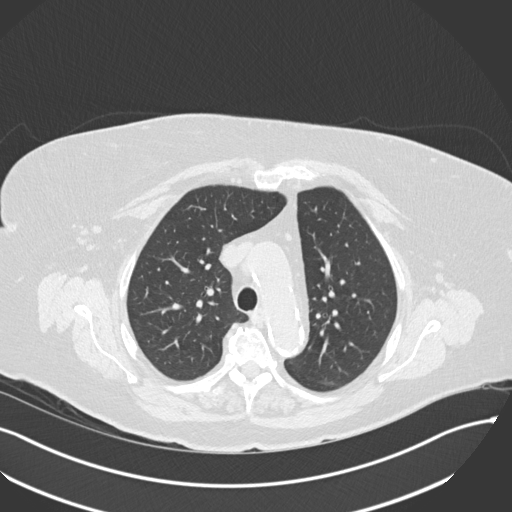
[im 115/150  lung]
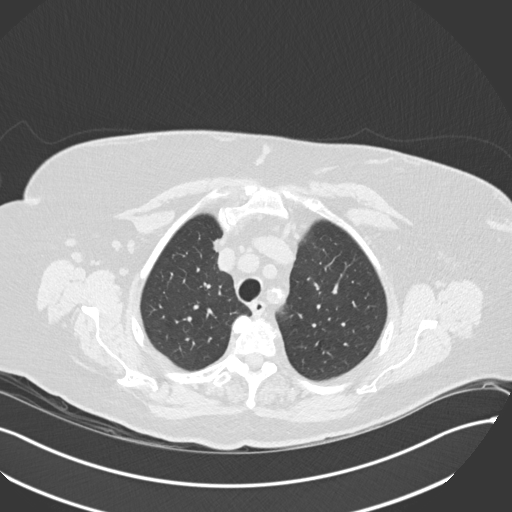
[im 127/150  lung]
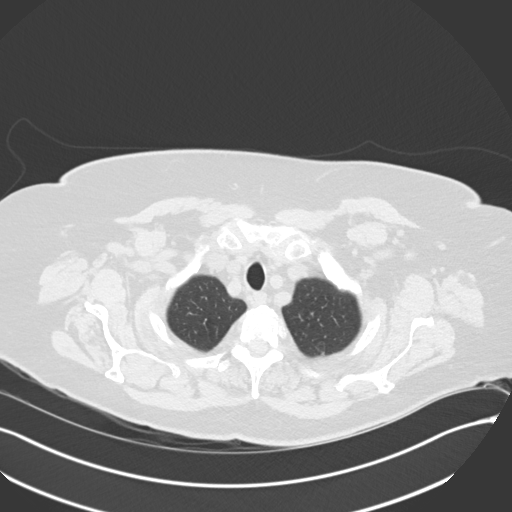
[im 138/150  lung]
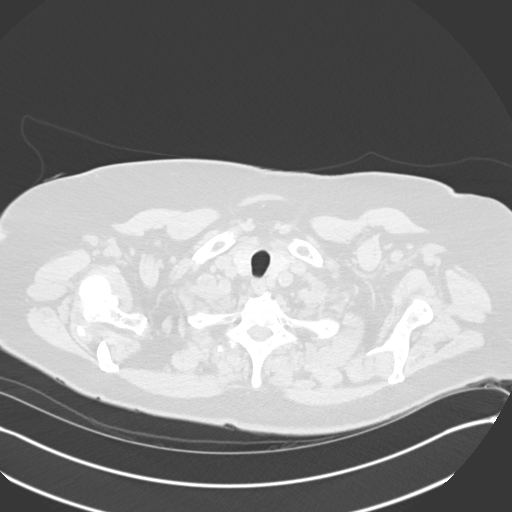

[Series 5: coronal · coronal · 0.59mm/px · 3 of 126 slices shown]
[im 26/126  lung]
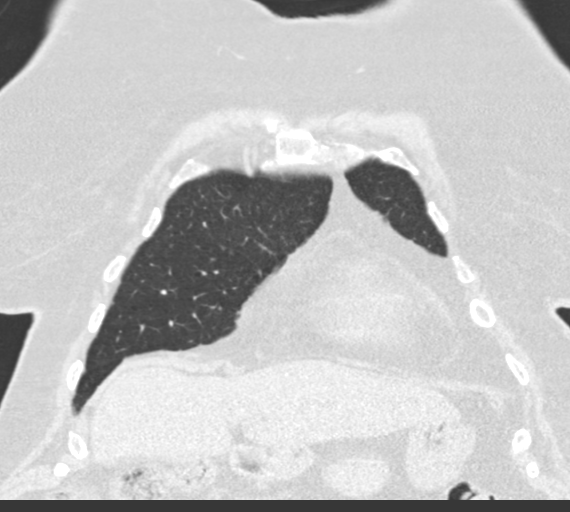
[im 51/126  lung]
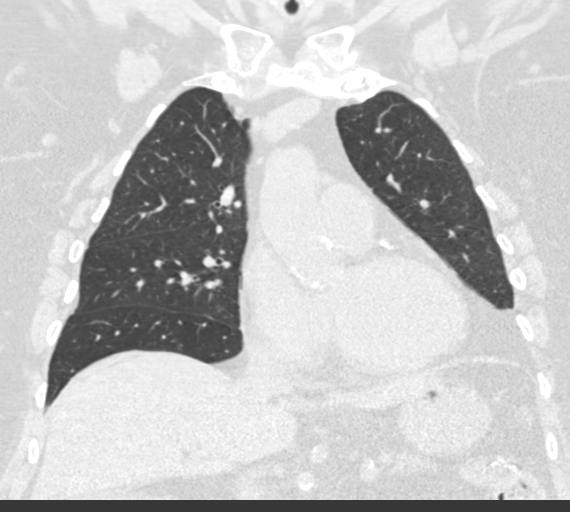
[im 76/126  lung]
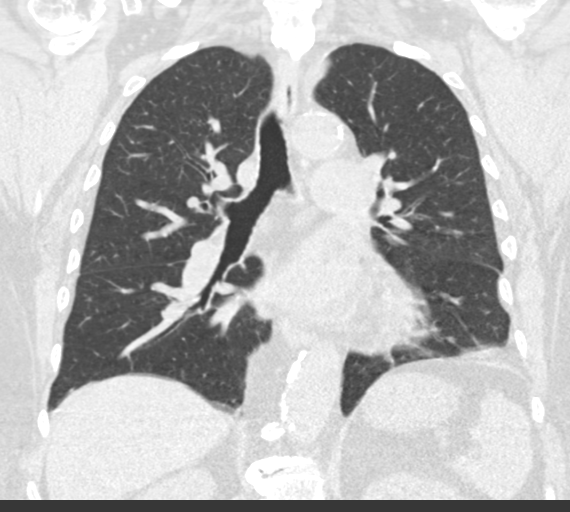

[15 of 36 positions shown; findings below may reference images not displayed]

FINDINGS: Cardiovascular: Atherosclerotic calcifications aorta, proximal great
vessels and coronary arteries. Heart size unremarkable. Minimal
pericardial fluid or thickening.

Mediastinum/Nodes: Scattered normal sized mediastinal and axillary
lymph nodes. No thoracic adenopathy. Esophagus and base of cervical
region normal appearance.

Lungs/Pleura: 2 mm RIGHT lower lobe nodule image 88 unchanged. 7 mm
RIGHT lower lobe nodule image 103 previously 4 mm, appears larger
and slightly spiculated. Minimal subsegmental atelectasis LEFT lower
lobe. Remaining lungs clear. No acute infiltrate, pleural effusion,
or pneumothorax.

Upper Abdomen: Post cholecystectomy. Visualized upper abdomen
probable LEFT adrenal myelolipoma 3.1 x 2.0 cm image 143 unchanged.
Remaining visualized upper abdomen unremarkable.

Musculoskeletal: Osseous demineralization with scattered
degenerative changes of the thoracic spine.
IMPRESSION: 7 mm RIGHT lower lobe pulmonary nodule previously 4 mm, appears
larger and now slightly spiculated, cannot exclude developing
neoplasm; recommend referral to multidisciplinary thoracic oncology
clinic for evaluation.

Additional 2 mm RIGHT lower lobe nodule unchanged.

Minimal subsegmental atelectasis LEFT lower lobe.

Stable LEFT adrenal myelolipoma.

Aortic Atherosclerosis ([id number]).

## 2023-04-11 ENCOUNTER — Ambulatory Visit: Payer: Medicare HMO | Admitting: Physician Assistant

## 2023-04-11 ENCOUNTER — Encounter: Payer: Self-pay | Admitting: Physician Assistant

## 2023-04-11 VITALS — BP 110/58 | HR 67 | Ht 64.0 in | Wt 258.2 lb

## 2023-04-11 DIAGNOSIS — R197 Diarrhea, unspecified: Secondary | ICD-10-CM | POA: Diagnosis not present

## 2023-04-11 DIAGNOSIS — K9089 Other intestinal malabsorption: Secondary | ICD-10-CM | POA: Diagnosis not present

## 2023-04-11 NOTE — Progress Notes (Signed)
 Chief Complaint: Follow-up diarrhea  HPI:    [name] [name] is an 83 year old female, signed to Dr. Leonides Schanz, with a past medical history as listed below including COPD, diabetes, GERD and status post cholecystectomy, who returns to clinic today for follow-up of diarrhea.      10/18/2021 patient initially seen in clinic by Dr. Leonides Schanz for diarrhea.  At that time discussed that she felt that her liquid stools were due to certain things she was eating.  Described a colonoscopy 2 to 3 years prior which was normal.  At that time patient was instructed on a low FODMAP diet and told to try daily Metamucil as well as continue Imodium 2 mg 3 times daily as needed.    02/08/2022 patient followed up in clinic for diarrhea and at that time doing better with a change in her diet.  She was continued on low FODMAP and told to trial daily Metamucil and continue Imodium 2 mg 3 times daily as needed.    08/23/2022 patient seen in clinic and was still having terrible diarrhea.  Had previously been helped by a change to a low FODMAP diet and Imodium 2 mg but was having multiple breakthroughs.  We trialed Cholestyramine 4 g packet twice a day.  Discussed titration of this.  Also discussed possible x-ray to rule out overflow constipation pending results.    Today, patient presents to clinic and tells me that "you fixed me" she has had to titrate the Cholestyramine down to a half a packet a day but this seems to be doing well and she will have 1 solid stool a day.  No further diarrhea.  Higher doses of this medicine caused constipation.    Denies fever, chills or weight loss.  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   Cataract    Closed fracture of upper end of humerus  11/03/2017   COPD (chronic obstructive pulmonary disease) (HCC)    Depression    Diabetes 1.5, managed as type 2 (HCC)    Encounter to establish care 06/03/2020   Essential hypertension 05/19/2019   Female stress incontinence 02/14/2018   GERD (gastroesophageal reflux disease)    History of opioid abuse (HCC) 05/19/2019   Hyperglycemia 05/19/2019   Hyperlipidemia    Hypertension    Menopause present 02/11/2018   Need for shingles vaccine 07/01/2020   Osteoporosis    Sleep apnea    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 18 each 1   ALPRAZolam (XANAX) 0.25 MG tablet TAKE (1) TABLET BY MOUTH TWICE DAILY AT 11AM & 5PM FOR ANXIETY. (Patient taking differently: Take 0.25 mg by mouth 2 (two) times daily. 1100 and 1500  And an extra 0.25mg  oral twice daily as needed for anxiety) 60 tablet 0   AMBULATORY NON FORMULARY MEDICATION One four wheel rolling walker with seat and hand brakes. Brand/style covered by insurance. Medically necessary for ICD-10: R26.81, Z74.1 1 Units 0   amLODIPine-Valsartan-HCTZ 10-320-25 MG TABS TAKE 1 TABLET BY MOUTH EVERY DAY 90 tablet 1   ARTHRITIS PAIN RELIEVING 0.075 % topical cream Apply 1 application topically 3 (three) times daily as needed (neuropathic pain in feet).     atorvastatin (LIPITOR) 10 MG tablet TAKE 1 TABLET BY MOUTH EVERY DAY 90 tablet 1   Bacillus Coagulans-Inulin (PROBIOTIC FORMULA) 1-250 BILLION-MG CAPS TAKE 1 CAPSULE (250MG ) BY MOUTH 2 TIMES A DAY. 60 capsule 11   blood  glucose meter kit and supplies KIT Meter, test strips, lancets, and alcohol swabs. Use for testing blood sugar every morning before meals and up to 3 additional times per day as needed. Brand based on patient and insurance coverage. 100 strips with 99 refills, 100 lancets with 99 refills, 200 alcohol swabs with 99 refills. Dx: E11.65 1 each 99   camphor-menthol (SARNA) lotion Apply 1 application topically as needed for itching. (Patient taking differently: Apply 1 application  topically 2 (two) times daily as needed for itching.) 222 mL 0   carvedilol (COREG) 6.25 MG tablet TAKE 1 TABLET BY MOUTH 2 TIMES DAILY WITH A MEAL. 180 tablet 1   carvedilol (COREG) 6.25 MG tablet TAKE 1 TABLET BY MOUTH 2 TIMES DAILY WITH A MEAL. 180 tablet 1   cetirizine (ZYRTEC) 10 MG tablet TAKE 1 TABLET BY MOUTH EVERY DAY 90 tablet 3   cholestyramine (QUESTRAN) 4 g packet Take 1 packet (4 g total) by mouth 2 (two) times daily. 60 packet 0   cloNIDine (CATAPRES) 0.1 MG tablet Take 1 tablet (0.1 mg total) by mouth 3 (three) times daily as needed (systolic blood pressurre above 190). 15 tablet 0   colchicine-probenecid 0.5-500 MG tablet Take 1 tablet by mouth daily. 90 tablet 1   diclofenac Sodium (VOLTAREN ARTHRITIS PAIN) 1 % GEL Apply 2 g topically 4 (four) times daily. 100 g 1   doxycycline (VIBRA-TABS) 100 MG tablet Take 1 tablet (100 mg total) by mouth 2 (two) times daily. 14 tablet 0   EASYMAX TEST test strip      famotidine (PEPCID) 20 MG tablet Take 1 tablet (20 mg total) by mouth daily as needed for heartburn or indigestion. 90 tablet 3   FARXIGA 10 MG TABS tablet TAKE 1 TABLET BY MOUTH DAILY BEFORE BREAKFAST. 90 tablet 1   ferrous sulfate 325 (65 FE) MG tablet TAKE 1 TABLET BY MOUTH EVERY DAY 90 tablet 2   hydrALAZINE (APRESOLINE) 25 MG tablet TAKE 1 TABLET BY MOUTH TWICE A DAY 180 tablet 1   Lactase 9000 units TABS Take one tablet with the first bite of meals containing milk or dairy products. (Patient taking  differently: Take 9,000 Units by mouth See admin instructions. Take 9000units with the first bite of meals containing milk or dairy products.) 90 tablet 11   lidocaine (LIDODERM) 5 % Place 1 patch onto the skin daily. Remove & Discard patch within 12 hours or as directed by MD 9 patch 0   methenamine (HIPREX) 1 g tablet Take 1 tablet (1 g total) by mouth 2 (two) times daily with a meal. 60 tablet 11   methylPREDNISolone (MEDROL DOSEPAK) 4 MG TBPK tablet Take per packet instructions 1 each 0   mirabegron ER (MYRBETRIQ) 25 MG  TB24 tablet Take 1 tablet (25 mg total) by mouth daily. 30 tablet 0   PARoxetine (PAXIL) 40 MG tablet Take 40 mg by mouth daily.     psyllium (METAMUCIL) 58.6 % packet Take 1 packet by mouth daily. 30 each 12   TRELEGY ELLIPTA 100-62.5-25 MCG/ACT AEPB INHALE 1 PUFF BY MOUTH INTO THE LUNGS DAILY. ** RINSE MOUTH AFTER USE** 60 each 0   YUVAFEM 10 MCG TABS vaginal tablet PLACE 1 TABLET VAGINALLY TWICE A WEEK 24 tablet 2   No current facility-administered medications for this visit.    Allergies as of 04/11/2023 - Review Complete 04/11/2023  Allergen Reaction Noted   Azithromycin Anaphylaxis and Hives 05/19/2019   Bee venom Anaphylaxis 03/09/2020   Erythromycin base Anaphylaxis 03/09/2020   Metronidazole Anaphylaxis 05/19/2019   Nitrofurantoin macrocrystal Anaphylaxis 03/09/2020   Penicillins Anaphylaxis and Hives 05/19/2019   Sulfa antibiotics Anaphylaxis and Hives 05/19/2019   Cymbalta [duloxetine hcl]  12/17/2019   Ivp dye [iodinated contrast media]  12/17/2019   Penicillin g Hives 06/03/2020   Codeine Rash 12/17/2019   Erythromycin Rash and Hives 05/19/2019   Fosfomycin Rash 12/24/2020   Levofloxacin Rash 12/17/2019   Nitrofurantoin Hives 05/19/2019   Percocet [oxycodone-acetaminophen] Rash 12/17/2019    Family History  Problem Relation Age of Onset   Cancer Mother    Intellectual disability Mother    Cancer Father    Early death Father    Hypertension  Father    Breast cancer Sister    Cancer Sister    Arthritis Maternal Grandmother    Asthma Paternal Grandmother    Asthma Paternal Grandfather    Colon cancer Neg Hx    Stomach cancer Neg Hx    Esophageal cancer Neg Hx    Colon polyps Neg Hx     Social History   Socioeconomic History   Marital status: Widowed    Spouse name: Not on file   Number of children: 3   Years of education: Not on file   Highest education level: Not on file  Occupational History   Occupation: Retired  Tobacco Use   Smoking status: Former    Current packs/day: 0.00    Types: Cigarettes    Start date: 1957    Quit date: 1988    Years since quitting: 37.2   Smokeless tobacco: Never  Vaping Use   Vaping status: Never Used  Substance and Sexual Activity   Alcohol use: Not Currently   Drug use: Never   Sexual activity: Not Currently  Other Topics Concern   Not on file  Social History Narrative   Not on file   Social Drivers of Health   Financial Resource Strain: Low Risk  (03/28/2022)   Overall Financial Resource Strain (CARDIA)    Difficulty of Paying Living Expenses: Not hard at all  Food Insecurity: No Food Insecurity (03/28/2022)   Hunger Vital Sign    Worried About Running Out of Food in the Last Year: Never true    Ran Out of Food in the Last Year: Never true  Transportation Needs: No Transportation Needs (03/28/2022)   PRAPARE - Administrator, Civil Service (Medical): No    Lack of Transportation (Non-Medical): No  Physical Activity: Inactive (03/28/2022)   Exercise Vital Sign    Days of Exercise per Week: 0 days    Minutes of Exercise per Session: 0 min  Stress: No Stress Concern Present (03/28/2022)   [employer] of Occupational Health - Occupational Stress Questionnaire  Feeling of Stress : Not at all  Social Connections: Unknown (11/30/2022)   Received from [facility]   Social Network    Social Network: Not on file  Intimate Partner Violence: Unknown  (11/30/2022)   Received from [facility]   HITS    Physically Hurt: Not on file    Insult or Talk Down To: Not on file    Threaten Physical Harm: Not on file    Scream or Curse: Not on file    Review of Systems:    Constitutional: No weight loss, fever or chills Cardiovascular: No chest pain   Respiratory: No SOB  Gastrointestinal: See HPI and otherwise negative   Physical Exam:  Vital signs: BP (!) 110/58   Pulse 67   Ht 5\' 4"  (1.626 m)   Wt 258 lb 4 oz (117.1 kg)   BMI 44.33 kg/m    Constitutional:   Pleasant Elderly Caucasian female appears to be in NAD, Well developed, Well nourished, alert and cooperative Respiratory: Respirations even and unlabored. Lungs clear to auscultation bilaterally.   No wheezes, crackles, or rhonchi.  Cardiovascular: Normal S1, S2. No MRG. Regular rate and rhythm. No peripheral edema, cyanosis or pallor.  Gastrointestinal:  Soft, nondistended, nontender. No rebound or guarding. Normal bowel sounds. No appreciable masses or hepatomegaly. Rectal:  Not performed.  Psychiatric: Oriented to person, place and time. Demonstrates good judgement and reason without abnormal affect or behaviors.  RELEVANT LABS AND IMAGING: CBC    Component Value Date/Time   WBC 8.7 07/05/2021 1021   WBC 11.8 (H) 03/13/2021 0221   RBC 5.01 07/05/2021 1021   RBC 4.12 03/13/2021 0221   HGB 12.8 07/05/2021 1021   HCT 41.0 07/05/2021 1021   PLT 275 07/05/2021 1021   MCV 82 07/05/2021 1021   MCH 25.5 (L) 07/05/2021 1021   MCH 19.9 (L) 03/13/2021 0221   MCHC 31.2 (L) 07/05/2021 1021   MCHC 28.8 (L) 03/13/2021 0221   RDW 16.3 (H) 07/05/2021 1021   LYMPHSABS 1.3 07/05/2021 1021   MONOABS 0.6 03/13/2021 0221   EOSABS 0.2 07/05/2021 1021   BASOSABS 0.0 07/05/2021 1021    CMP     Component Value Date/Time   NA 144 10/18/2022 1145   K 4.2 10/18/2022 1145   CL 106 10/18/2022 1145   CO2 21 10/18/2022 1145   GLUCOSE 144 (H) 10/18/2022 1145   GLUCOSE 104 (H)  04/30/2021 1148   BUN 31 (H) 10/18/2022 1145   CREATININE 1.34 (H) 10/18/2022 1145   CALCIUM 9.3 10/18/2022 1145   PROT 6.7 10/18/2022 1145   ALBUMIN 4.0 10/18/2022 1145   AST 25 10/18/2022 1145   ALT 18 10/18/2022 1145   ALKPHOS 119 10/18/2022 1145   BILITOT 0.4 10/18/2022 1145   GFRNONAA >60 04/30/2021 1148   GFRAA 69 12/29/2019 1434    Assessment: 1.  Diarrhea: Postcholecystectomy/bile salt induced diarrhea better on a half a packet of cholestyramine daily  Plan: 1.  Patient tells me she does not need a refill of her Cholestyramine yet.  She will call the office and let us know when she does.  When she does, would prescribe 1 packet daily #90 with 3 refills. 2.  Patient will follow in clinic in 2 years for further refills.  If she needs another refill in a year she does not need to be seen.  Hyacinth Meeker, PA-C [facility] 04/11/2023, 11:07 AM  Cc: Suzan Slick, MD

## 2023-04-13 ENCOUNTER — Encounter (HOSPITAL_BASED_OUTPATIENT_CLINIC_OR_DEPARTMENT_OTHER): Payer: Self-pay

## 2023-04-15 NOTE — Progress Notes (Unsigned)
 Cardiology Office Note    Date:  04/15/2023  ID:  [name] [name], DOB May 24, 1940, MRN [id number] PCP:  Suzan Slick, MD  Cardiologist:  None  Electrophysiologist:  None   Chief Complaint: ***  History of Present Illness: [name] [name]    [name] [name] is a 83 y.o. female with visit-pertinent history of hx of melanoma, COPD, hypertension, hyperlipidemia, OSA who presents for follow-up.  She was referred by [name], PA for evaluation of hypertension, initially seen on 12/30/2019 by [name].  She was previously followed with [name] in [place], [place].  Patient reports that she had an episode of syncope in 2019 and had loop recorder inserted.  Echocardiogram 03/18/2020 showed normal biventricular function, no significant valvular disease.  Lexiscan Myoview on 05/26/2020 showed normal perfusion, EF 61%.  Echocardiogram 08/01/2022 showed normal biventricular function, aortic valve sclerosis.  She was last seen in clinic by Dr. [name] in 03/2022, she remained stable from a cardiac standpoint.  Today she reports that she  Hx of chest pain: Patient has previously reported atypical chest pain and dyspnea with minimal exertion.  Echo on 03/2020 showed normal biventricular function, no significant valvular disease.  Lexiscan Myoview in 05/2020 showed normal perfusion, EF 61%.  Echo in 03/2022 showed normal biventricular function, aortic valve sclerosis. Today she reports that she History of syncope: Patient reports she had syncopal episode in 2019 and had a loop recorder placed while in [place].  She is previously established with device clinic for her loop interrogations that were unremarkable.  She reported no further syncopal episodes and her vice was later turned off. Heart murmur: Patient has history of a 2 out of 6 systolic murmur on exam.  She is aortic sclerosis noted on prior echo in 03/2020. Hypertension: Blood pressure today Continue current antihypertensive  regimen. Hyperlipidemia: Last lipid profile on Carotid stenosis: Carotid duplex on 03/29/2023 indicated right ICA consistent with 1 to 39% stenosis, left ICA consistent with 40 to 59% stenosis.  She is to have repeat duplex in 1 year. PAD: OSA: Patient reports CPAP compliance.   Labwork independently reviewed:   ROS: .   *** denies chest pain, shortness of breath, lower extremity edema, fatigue, palpitations, melena, hematuria, hemoptysis, diaphoresis, weakness, presyncope, syncope, orthopnea, and PND.  All other systems are reviewed and otherwise negative.  Studies Reviewed: [name] [name]    EKG:  EKG is ordered today, personally reviewed, demonstrating ***     CV Studies: Cardiac studies reviewed are outlined and summarized above. Otherwise please see EMR for full report. Cardiac Studies & Procedures   ______________________________________________________________________________________________   STRESS TESTS  MYOCARDIAL PERFUSION IMAGING 05/26/2020  Narrative  The left ventricular ejection fraction is normal (55-65%).  Nuclear stress EF: 61%.  Blood pressure demonstrated a normal response to exercise.  There was no ST segment deviation noted during stress.  The study is normal.  This is a low risk study.  Normal resting and stress perfusion. No ischemia or infarction EF 61%   ECHOCARDIOGRAM  ECHOCARDIOGRAM COMPLETE 08/01/2022  Narrative ECHOCARDIOGRAM REPORT    Patient Name:   [name] [name] Date of Exam: 08/01/2022 Medical Rec #:  [id number]        Height:       63.0 in Accession #:    [id number]       Weight:       258.2 lb Date of Birth:  [birth year]       BSA:          2.155 m  Patient Age:    81 years         BP:           138/64 mmHg Patient Gender: F                HR:           71 bpm. Exam Location:  [address]  Procedure: 2D Echo, Color Doppler and Cardiac Doppler  Indications:    Murmur R01.1  History:        Patient has prior history of  Echocardiogram examinations, most recent 03/18/2020. COPD; Risk Factors:Hypertension and Dyslipidemia.  Sonographer:    Thurman Coyer RDCS Referring Phys: [id number] CHRISTOPHER L SCHUMANN  IMPRESSIONS   1. Left ventricular ejection fraction, by estimation, is 60 to 65%. The left ventricle has normal function. The left ventricle has no regional wall motion abnormalities. Left ventricular diastolic parameters are consistent with Grade I diastolic dysfunction (impaired relaxation). 2. Right ventricular systolic function is normal. The right ventricular size is normal. 3. The mitral valve is normal in structure. No evidence of mitral valve regurgitation. No evidence of mitral stenosis. 4. The aortic valve is normal in structure. There is mild calcification of the aortic valve. There is mild thickening of the aortic valve. Aortic valve regurgitation is not visualized. Aortic valve sclerosis/calcification is present, without any evidence of aortic stenosis. Aortic valve mean gradient measures 8.2 mmHg. Aortic valve Vmax measures 2.10 m/s.  FINDINGS Left Ventricle: Left ventricular ejection fraction, by estimation, is 60 to 65%. The left ventricle has normal function. The left ventricle has no regional wall motion abnormalities. The left ventricular internal cavity size was normal in size. There is no left ventricular hypertrophy. Left ventricular diastolic parameters are consistent with Grade I diastolic dysfunction (impaired relaxation).  Right Ventricle: The right ventricular size is normal. No increase in right ventricular wall thickness. Right ventricular systolic function is normal.  Left Atrium: Left atrial size was normal in size.  Right Atrium: Right atrial size was normal in size.  Pericardium: There is no evidence of pericardial effusion.  Mitral Valve: The mitral valve is normal in structure. No evidence of mitral valve regurgitation. No evidence of mitral valve  stenosis.  Tricuspid Valve: The tricuspid valve is normal in structure. Tricuspid valve regurgitation is trivial. No evidence of tricuspid stenosis.  Aortic Valve: The aortic valve is normal in structure. There is mild calcification of the aortic valve. There is mild thickening of the aortic valve. Aortic valve regurgitation is not visualized. Aortic valve sclerosis/calcification is present, without any evidence of aortic stenosis. Aortic valve mean gradient measures 8.2 mmHg. Aortic valve peak gradient measures 17.6 mmHg.  Pulmonic Valve: The pulmonic valve was normal in structure. Pulmonic valve regurgitation is mild. No evidence of pulmonic stenosis.  Aorta: The aortic root is normal in size and structure.  IAS/Shunts: No atrial level shunt detected by color flow Doppler.   LEFT VENTRICLE PLAX 2D LVIDd:         5.30 cm LVIDs:         3.40 cm LV PW:         1.00 cm LV IVS:        0.80 cm LVOT diam:     2.30 cm LVOT Area:     4.15 cm   LEFT ATRIUM         Index LA diam:    4.30 cm 2.00 cm/m AORTIC VALVE AV Vmax:      210.00 cm/s AV  Peak Grad: 17.6 mmHg AV Mean Grad: 8.2 mmHg  AORTA Ao Root diam: 3.30 cm Ao Asc diam:  3.60 cm   SHUNTS Systemic Diam: 2.30 cm  Chilton Si MD Electronically signed by Chilton Si MD Signature Date/Time: 08/01/2022/4:46:42 PM    Final          ______________________________________________________________________________________________       Current Reported Medications:.    No outpatient medications have been marked as taking for the 04/16/23 encounter (Appointment) with Rip Harbour, NP.    Physical Exam:    VS:  There were no vitals taken for this visit.   Wt Readings from Last 3 Encounters:  04/11/23 258 lb 4 oz (117.1 kg)  04/04/23 256 lb 12.8 oz (116.5 kg)  03/07/23 263 lb 9.6 oz (119.6 kg)    GEN: Well nourished, well developed in no acute distress NECK: No JVD; No carotid bruits CARDIAC: ***RRR, no  murmurs, rubs, gallops RESPIRATORY:  Clear to auscultation without rales, wheezing or rhonchi  ABDOMEN: Soft, non-tender, non-distended EXTREMITIES:  No edema; No acute deformity     Asessement and Plan:.     ***     Disposition: F/u with ***  Signed, Rip Harbour, NP

## 2023-04-16 ENCOUNTER — Encounter: Payer: Medicare HMO | Admitting: Family Medicine

## 2023-04-16 ENCOUNTER — Encounter: Payer: Self-pay | Admitting: Cardiology

## 2023-04-16 ENCOUNTER — Ambulatory Visit: Payer: Medicare HMO | Attending: Cardiology | Admitting: Cardiology

## 2023-04-16 VITALS — BP 118/60 | HR 71 | Ht 64.0 in | Wt 259.0 lb

## 2023-04-16 DIAGNOSIS — I6523 Occlusion and stenosis of bilateral carotid arteries: Secondary | ICD-10-CM

## 2023-04-16 DIAGNOSIS — R55 Syncope and collapse: Secondary | ICD-10-CM

## 2023-04-16 DIAGNOSIS — R011 Cardiac murmur, unspecified: Secondary | ICD-10-CM

## 2023-04-16 DIAGNOSIS — I739 Peripheral vascular disease, unspecified: Secondary | ICD-10-CM

## 2023-04-16 DIAGNOSIS — E785 Hyperlipidemia, unspecified: Secondary | ICD-10-CM

## 2023-04-16 DIAGNOSIS — I10 Essential (primary) hypertension: Secondary | ICD-10-CM

## 2023-04-16 DIAGNOSIS — R0989 Other specified symptoms and signs involving the circulatory and respiratory systems: Secondary | ICD-10-CM

## 2023-04-16 NOTE — Patient Instructions (Signed)
 Medication Instructions:  No changes *If you need a refill on your cardiac medications before your next appointment, please call your pharmacy*  Lab Work: In the next 2 weeks we are going to need fasting lipid panel and Cmet If you have labs (blood work) drawn today and your tests are completely normal, you will receive your results only by: MyChart Message (if you have MyChart) OR A paper copy in the mail If you have any lab test that is abnormal or we need to change your treatment, we will call you to review the results.  Testing/Procedures: No testing  Follow-Up: At [facility], you and your health needs are our priority.  As part of our continuing mission to provide you with exceptional heart care, we have created designated Provider Care Teams.  These Care Teams include your primary Cardiologist (physician) and Advanced Practice Providers (APPs -  Physician Assistants and Nurse Practitioners) who all work together to provide you with the care you need, when you need it.  We recommend signing up for the patient portal called "MyChart".  Sign up information is provided on this After Visit Summary.  MyChart is used to connect with patients for Virtual Visits (Telemedicine).  Patients are able to view lab/test results, encounter notes, upcoming appointments, etc.  Non-urgent messages can be sent to your provider as well.   To learn more about what you can do with MyChart, go to [email or web address].    Your next appointment:   6 month(s)  Provider:   Little Ishikawa, MD     Other Instructions

## 2023-04-19 LAB — COMPREHENSIVE METABOLIC PANEL
ALT: 14 IU/L (ref 0–32)
AST: 18 IU/L (ref 0–40)
Albumin: 4.2 g/dL (ref 3.7–4.7)
Alkaline Phosphatase: 112 IU/L (ref 44–121)
BUN/Creatinine Ratio: 25 (ref 12–28)
BUN: 24 mg/dL (ref 8–27)
Bilirubin Total: 0.4 mg/dL (ref 0.0–1.2)
CO2: 21 mmol/L (ref 20–29)
Calcium: 9.7 mg/dL (ref 8.7–10.3)
Chloride: 106 mmol/L (ref 96–106)
Creatinine, Ser: 0.97 mg/dL (ref 0.57–1.00)
Globulin, Total: 2.7 g/dL (ref 1.5–4.5)
Glucose: 93 mg/dL (ref 70–99)
Potassium: 4.5 mmol/L (ref 3.5–5.2)
Sodium: 143 mmol/L (ref 134–144)
Total Protein: 6.9 g/dL (ref 6.0–8.5)
eGFR: 58 mL/min/{1.73_m2} — ABNORMAL LOW (ref >59)

## 2023-04-19 LAB — LIPID PANEL
Chol/HDL Ratio: 2.8 ratio (ref 0.0–4.4)
Cholesterol, Total: 130 mg/dL (ref 100–199)
HDL: 47 mg/dL (ref >39)
LDL Chol Calc (NIH): 63 mg/dL (ref 0–99)
Triglycerides: 112 mg/dL (ref 0–149)
VLDL Cholesterol Cal: 20 mg/dL (ref 5–40)

## 2023-04-22 NOTE — Progress Notes (Signed)
 I agree with the assessment and plan as outlined by Ms. Lemmon.

## 2023-04-24 ENCOUNTER — Encounter: Payer: Self-pay | Admitting: Family Medicine

## 2023-04-24 ENCOUNTER — Ambulatory Visit (INDEPENDENT_AMBULATORY_CARE_PROVIDER_SITE_OTHER): Admitting: Family Medicine

## 2023-04-24 VITALS — BP 116/68 | Temp 98.6°F | Ht 64.0 in | Wt 259.0 lb

## 2023-04-24 DIAGNOSIS — E1149 Type 2 diabetes mellitus with other diabetic neurological complication: Secondary | ICD-10-CM | POA: Diagnosis not present

## 2023-04-24 DIAGNOSIS — Z7984 Long term (current) use of oral hypoglycemic drugs: Secondary | ICD-10-CM | POA: Diagnosis not present

## 2023-04-24 DIAGNOSIS — Z0000 Encounter for general adult medical examination without abnormal findings: Secondary | ICD-10-CM

## 2023-04-24 NOTE — Progress Notes (Signed)
 Subjective:   [name] [name] is a 83 y.o. female who presents for Medicare Annual (Subsequent) preventive examination.  Visit Complete: Virtual I connected with  [name] [name] on 04/24/23 by a audio enabled telemedicine application and verified that I am speaking with the correct person using two identifiers.  Patient Location: Other:  [name] Location: Office/Clinic  I discussed the limitations of evaluation and management by telemedicine. The patient expressed understanding and agreed to proceed.  Vital Signs: Because this visit was a virtual/telehealth visit, some criteria may be missing or patient reported. Any vitals not documented were not able to be obtained and vitals that have been documented are patient reported.  Patient Medicare AWV questionnaire was completed by the patient on 04/24/23; I have confirmed that all information answered by patient is correct and no changes since this date.  Cardiac Risk Factors include: advanced age (>68men, >35 women);diabetes mellitus;hypertension;obesity (BMI >30kg/m2)     Objective:    Today's Vitals   04/24/23 1409  BP: 116/68  Temp: 98.6 F (37 C)   There is no height or weight on file to calculate BMI.     04/24/2023    2:18 PM 04/10/2022    2:58 PM 03/28/2022    3:42 PM 07/19/2021    1:59 PM 03/09/2021    4:03 PM 07/01/2020    1:34 PM 03/16/2020    2:00 PM  Advanced Directives  Does Patient Have a Medical Advance Directive? Yes No;Yes Yes Yes No Yes Yes  Type of Advance Directive Living will  Healthcare Power of [place];Living will Healthcare Power of [place];Living will  Healthcare Power of [place];Living will;Out of facility DNR (pink MOST or yellow form) Healthcare Power of [place];Living will  Does patient want to make changes to medical advance directive? Yes (MAU/Ambulatory/Procedural Areas - Information given)        Copy of Healthcare Power of Attorney in Chart?   No - copy  requested No - copy requested  No - copy requested No - copy requested  Would patient like information on creating a medical advance directive?     No - Patient declined      Current Medications (verified) Outpatient Encounter Medications as of 04/24/2023  Medication Sig   albuterol (VENTOLIN HFA) 108 (90 Base) MCG/ACT inhaler INHALE 1-2 PUFFS INTO THE LUNGS EVERY 4 HOURS AS NEEDED FOR WHEEZING OR SHORTNESS OF BREATH.   ALPRAZolam (XANAX) 0.25 MG tablet TAKE (1) TABLET BY MOUTH TWICE DAILY AT 11AM & 5PM FOR ANXIETY. (Patient taking differently: Take 0.25 mg by mouth 2 (two) times daily. 1100 and 1500 And an extra 0.25mg  oral twice daily as needed for anxiety)   AMBULATORY NON FORMULARY MEDICATION One four wheel rolling walker with seat and hand brakes. Brand/style covered by insurance. Medically necessary for ICD-10: R26.81, Z74.1   amLODIPine-Valsartan-HCTZ 10-320-25 MG TABS TAKE 1 TABLET BY MOUTH EVERY DAY   ARTHRITIS PAIN RELIEVING 0.075 % topical cream Apply 1 application topically 3 (three) times daily as needed (neuropathic pain in feet).   atorvastatin (LIPITOR) 10 MG tablet TAKE 1 TABLET BY MOUTH EVERY DAY   Bacillus Coagulans-Inulin (PROBIOTIC FORMULA) 1-250 BILLION-MG CAPS TAKE 1 CAPSULE (250MG ) BY MOUTH 2 TIMES A DAY.   blood glucose meter kit and supplies KIT Meter, test strips, lancets, and alcohol swabs. Use for testing blood sugar every morning before meals and up to 3 additional times per day as needed. Brand based on patient and insurance coverage. 100 strips with  99 refills, 100 lancets with 99 refills, 200 alcohol swabs with 99 refills. Dx: E11.65   camphor-menthol (SARNA) lotion Apply 1 application topically as needed for itching. (Patient taking differently: Apply 1 application  topically 2 (two) times daily as needed for itching.)   carvedilol (COREG) 6.25 MG tablet TAKE 1 TABLET BY MOUTH 2 TIMES DAILY WITH A MEAL. (Patient not taking: Reported on 04/16/2023)   carvedilol  (COREG) 6.25 MG tablet TAKE 1 TABLET BY MOUTH 2 TIMES DAILY WITH A MEAL.   cetirizine (ZYRTEC) 10 MG tablet TAKE 1 TABLET BY MOUTH EVERY DAY   cholestyramine (QUESTRAN) 4 g packet Take 1 packet (4 g total) by mouth 2 (two) times daily.   cloNIDine (CATAPRES) 0.1 MG tablet Take 1 tablet (0.1 mg total) by mouth 3 (three) times daily as needed (systolic blood pressurre above 190).   colchicine-probenecid 0.5-500 MG tablet Take 1 tablet by mouth daily.   diclofenac Sodium (VOLTAREN ARTHRITIS PAIN) 1 % GEL Apply 2 g topically 4 (four) times daily.   doxycycline (VIBRA-TABS) 100 MG tablet Take 1 tablet (100 mg total) by mouth 2 (two) times daily.   EASYMAX TEST test strip    famotidine (PEPCID) 20 MG tablet Take 1 tablet (20 mg total) by mouth daily as needed for heartburn or indigestion.   FARXIGA 10 MG TABS tablet TAKE 1 TABLET BY MOUTH DAILY BEFORE BREAKFAST.   ferrous sulfate 325 (65 FE) MG tablet TAKE 1 TABLET BY MOUTH EVERY DAY   hydrALAZINE (APRESOLINE) 25 MG tablet TAKE 1 TABLET BY MOUTH TWICE A DAY   Lactase 9000 units TABS Take one tablet with the first bite of meals containing milk or dairy products. (Patient taking differently: Take 9,000 Units by mouth See admin instructions. Take 9000units with the first bite of meals containing milk or dairy products.)   lidocaine (LIDODERM) 5 % Place 1 patch onto the skin daily. Remove & Discard patch within 12 hours or as directed by MD   methenamine (HIPREX) 1 g tablet Take 1 tablet (1 g total) by mouth 2 (two) times daily with a meal.   methylPREDNISolone (MEDROL DOSEPAK) 4 MG TBPK tablet Take per packet instructions   mirabegron ER (MYRBETRIQ) 25 MG TB24 tablet Take 1 tablet (25 mg total) by mouth daily.   PARoxetine (PAXIL) 40 MG tablet Take 40 mg by mouth daily.   psyllium (METAMUCIL) 58.6 % packet Take 1 packet by mouth daily.   TRELEGY ELLIPTA 100-62.5-25 MCG/ACT AEPB INHALE 1 PUFF BY MOUTH INTO THE LUNGS DAILY. ** RINSE MOUTH AFTER USE**    YUVAFEM 10 MCG TABS vaginal tablet PLACE 1 TABLET VAGINALLY TWICE A WEEK   No facility-administered encounter medications on file as of 04/24/2023.    Allergies (verified) Azithromycin, Bee venom, Erythromycin base, Metronidazole, Nitrofurantoin macrocrystal, Penicillins, Sulfa antibiotics, Cortisone, Cymbalta [duloxetine hcl], Ivp dye [iodinated contrast media], Penicillin g, Codeine, Erythromycin, Fosfomycin, Levofloxacin, Nitrofurantoin, and Percocet [oxycodone-acetaminophen]   History: 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   Cataract    Closed fracture of upper end of humerus 11/03/2017   COPD (chronic obstructive pulmonary disease) (HCC)    Depression    Diabetes 1.5, managed as type 2 (HCC)  Encounter to establish care 06/03/2020   Essential hypertension 05/19/2019   Female stress incontinence 02/14/2018   GERD (gastroesophageal reflux disease)    History of opioid abuse (HCC) 05/19/2019   Hyperglycemia 05/19/2019   Hyperlipidemia    Hypertension    Menopause present 02/11/2018   Need for shingles vaccine 07/01/2020   Osteoporosis    Sleep apnea    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   Past Surgical History:  Procedure Laterality Date   ABDOMINAL HYSTERECTOMY  1980   BREAST EXCISIONAL BIOPSY     1990's   CHOLECYSTECTOMY  1980   MELANOMA EXCISION Right    Great Toe   REPLACEMENT TOTAL KNEE Bilateral 2000   sacral neuromodulation     Family History  Problem Relation Age of Onset   Cancer Mother    Intellectual disability Mother    Cancer Father    Early death Father    Hypertension Father    Breast cancer Sister    Cancer Sister    Arthritis Maternal Grandmother    Asthma Paternal Grandmother     Asthma Paternal Grandfather    Colon cancer Neg Hx    Stomach cancer Neg Hx    Esophageal cancer Neg Hx    Colon polyps Neg Hx    Social History   Socioeconomic History   Marital status: Widowed    Spouse name: Not on file   Number of children: 3   Years of education: Not on file   Highest education level: Not on file  Occupational History   Occupation: Retired  Tobacco Use   Smoking status: Former    Current packs/day: 0.00    Types: Cigarettes    Start date: 1957    Quit date: 1988    Years since quitting: 37.2   Smokeless tobacco: Never  Vaping Use   Vaping status: Never Used  Substance and Sexual Activity   Alcohol use: Not Currently   Drug use: Never   Sexual activity: Not Currently  Other Topics Concern   Not on file  Social History Narrative   Not on file   Social Drivers of Health   Financial Resource Strain: Low Risk  (04/24/2023)   Overall Financial Resource Strain (CARDIA)    Difficulty of Paying Living Expenses: Not hard at all  Food Insecurity: No Food Insecurity (04/24/2023)   Hunger Vital Sign    Worried About Running Out of Food in the Last Year: Never true    Ran Out of Food in the Last Year: Never true  Transportation Needs: No Transportation Needs (04/24/2023)   PRAPARE - Administrator, Civil Service (Medical): No    Lack of Transportation (Non-Medical): No  Physical Activity: Insufficiently Active (04/24/2023)   Exercise Vital Sign    Days of Exercise per Week: 7 days    Minutes of Exercise per Session: 20 min  Stress: No Stress Concern Present (04/24/2023)   [employer] of Occupational Health - Occupational Stress Questionnaire    Feeling of Stress : Not at all  Social Connections: Unknown (11/30/2022)   Received from [facility]   Social Network    Social Network: Not on file    Tobacco Counseling Counseling given: Not Answered   Clinical Intake:  Pre-visit preparation completed: No  Pain : No/denies pain      BMI - recorded: 44 Nutritional Status: BMI > 30  Obese Nutritional Risks: None Diabetes: Yes CBG done?: Yes CBG resulted in Enter/ Edit results?: Yes (116)  Did pt. bring in CBG monitor from home?: No  How often do you need to have someone help you when you read instructions, pamphlets, or other written materials from your doctor or pharmacy?: 1 - Never What is the last grade level you completed in school?: 2 years of college  Interpreter Needed?: No      Activities of Daily Living    04/24/2023    2:17 PM  In your present state of health, do you have any difficulty performing the following activities:  Hearing? 0  Vision? 0  Difficulty concentrating or making decisions? 0  Walking or climbing stairs? 1  Dressing or bathing? 0  Doing errands, shopping? 0  Preparing Food and eating ? N  Using the Toilet? N  In the past six months, have you accidently leaked urine? N  Do you have problems with loss of bowel control? N  Managing your Medications? N  Managing your Finances? N  Housekeeping or managing your Housekeeping? N    Patient Care Team: Suzan Slick, MD as PCP - General (Family Medicine) Little Ishikawa, MD as PCP - Cardiology (Cardiology)  Indicate any recent Medical Services you may have received from other than [facility] providers in the past year (date may be approximate).     Assessment:   This is a routine wellness examination for [facility].  Hearing/Vision screen No results found.   Goals Addressed             This Visit's Progress    Patient Stated       Walk further with assistive device/walker      Depression Screen    04/24/2023    2:20 PM 04/24/2023    2:15 PM 04/10/2022    2:57 PM 03/28/2022    3:39 PM 07/14/2021    4:35 PM 05/05/2021    1:54 PM 11/04/2020    1:18 PM  PHQ 2/9 Scores  PHQ - 2 Score 0 0 2 0 0 0 5  PHQ- 9 Score   3    13  Exception Documentation     Medical reason Medical reason     Fall Risk    04/24/2023     2:18 PM 04/04/2023   11:33 AM 04/10/2022    2:56 PM 03/28/2022    3:42 PM 07/14/2021    4:35 PM  Fall Risk   Falls in the past year? 0 0 1 0 0  Number falls in past yr: 0  0 0 0  Injury with Fall? 0  0 0 0  Risk for fall due to : No Fall Risks  No Fall Risks No Fall Risks No Fall Risks  Follow up Falls evaluation completed  Falls evaluation completed Falls prevention discussed Falls evaluation completed;Education provided    MEDICARE RISK AT HOME: Medicare Risk at Home Any stairs in or around the home?: Yes (yes in the independent living facility but pt doesn't use them) If so, are there any without handrails?: No Home free of loose throw rugs in walkways, pet beds, electrical cords, etc?: Yes Adequate lighting in your home to reduce risk of falls?: Yes Life alert?: Yes Use of a cane, walker or w/c?: Yes (walker) Grab bars in the bathroom?: Yes Shower chair or bench in shower?: Yes Elevated toilet seat or a handicapped toilet?: No  TIMED UP AND GO:  Was the test performed?  No    Cognitive Function:        04/24/2023    2:20  PM 03/28/2022    3:42 PM 07/01/2020    1:40 PM  6CIT Screen  What Year? 0 points 0 points 4 points  What month?  0 points 0 points  What time?  0 points 0 points  Count back from 20  0 points 0 points  Months in reverse  2 points 0 points  Repeat phrase  6 points 2 points  Total Score  8 points 6 points    Immunizations Immunization History  Administered Date(s) Administered   Fluzone Influenza virus vaccine,trivalent (IIV3), split virus 12/09/2018   Influenza, High Dose Seasonal PF 11/07/2019, 12/28/2022   Influenza-Unspecified 10/21/2020   Moderna Sars-Covid-2 Vaccination 05/05/2019, 12/18/2019   Pneumococcal Conjugate-13 11/07/2018   Pneumococcal Polysaccharide-23 02/07/2012   Zoster Recombinant(Shingrix) 12/10/2020    TDAP status: Due, Education has been provided regarding the importance of this vaccine. Advised may receive this vaccine at  local pharmacy or Health Dept. Aware to provide a copy of the vaccination record if obtained from local pharmacy or Health Dept. Verbalized acceptance and understanding.  Flu Vaccine status: Up to date  Pneumococcal vaccine status: Up to date  Covid-19 vaccine status: Completed vaccines  Qualifies for Shingles Vaccine? Yes   Zostavax completed No   Shingrix Completed?: No.    Education has been provided regarding the importance of this vaccine. Patient has been advised to call insurance company to determine out of pocket expense if they have not yet received this vaccine. Advised may also receive vaccine at local pharmacy or Health Dept. Verbalized acceptance and understanding.  Screening Tests Health Maintenance  Topic Date Due   FOOT EXAM  Never done   OPHTHALMOLOGY EXAM  Never done   Diabetic kidney evaluation - Urine ACR  Never done   DEXA SCAN  Never done   COVID-19 Vaccine (3 - Moderna risk series) 01/15/2020   HEMOGLOBIN A1C  04/17/2023   Diabetic kidney evaluation - eGFR measurement  04/17/2024   Medicare Annual Wellness (AWV)  04/23/2024   Pneumonia Vaccine 85+ Years old  Completed   INFLUENZA VACCINE  Completed   HPV VACCINES  Aged Out   DTaP/Tdap/Td  Discontinued   Zoster Vaccines- Shingrix  Discontinued    Health Maintenance  Health Maintenance Due  Topic Date Due   FOOT EXAM  Never done   OPHTHALMOLOGY EXAM  Never done   Diabetic kidney evaluation - Urine ACR  Never done   DEXA SCAN  Never done   COVID-19 Vaccine (3 - Moderna risk series) 01/15/2020   HEMOGLOBIN A1C  04/17/2023    Colorectal cancer screening: No longer required.   Mammogram status: No longer required due to age.  Bone Density status: Ordered declined. Pt provided with contact info and advised to call to schedule appt.  Lung Cancer Screening: (Low Dose CT Chest recommended if Age 30-80 years, 20 pack-year currently smoking OR have quit w/in 15years.) does not qualify.   Lung Cancer  Screening Referral: N/A  Additional Screening:  Hepatitis C Screening: does not qualify; Completed N/A  Vision Screening: Recommended annual ophthalmology exams for early detection of glaucoma and other disorders of the eye. Is the patient up to date with their annual eye exam?  No  Who is the provider or what is the name of the office in which the patient attends annual eye exams? N/A If pt is not established with a provider, would they like to be referred to a provider to establish care? No .   Dental Screening: Recommended annual dental  exams for proper oral hygiene  Diabetic Foot Exam: Diabetic Foot Exam: Overdue, Pt has been advised about the importance in completing this exam. Pt is scheduled for diabetic foot exam on next visit in office.  Community Resource Referral / Chronic Care Management: CRR required this visit?  No   CCM required this visit?  No     Plan:     I have personally reviewed and noted the following in the patient's chart:   Medical and social history Use of alcohol, tobacco or illicit drugs  Current medications and supplements including opioid prescriptions. Patient is not currently taking opioid prescriptions. Functional ability and status Nutritional status Physical activity Advanced directives List of other physicians Hospitalizations, surgeries, and ER visits in previous 12 months Vitals Screenings to include cognitive, depression, and falls Referrals and appointments  In addition, I have reviewed and discussed with patient certain preventive protocols, quality metrics, and best practice recommendations. A written personalized care plan for preventive services as well as general preventive health recommendations were provided to patient.     Suzan Slick, MD   04/24/2023   After Visit Summary: (MyChart) Due to this being a telephonic visit, the after visit summary with patients personalized plan was offered to patient via MyChart

## 2023-04-26 LAB — HM DIABETES EYE EXAM

## 2023-04-28 ENCOUNTER — Other Ambulatory Visit: Payer: Self-pay | Admitting: Acute Care

## 2023-05-02 ENCOUNTER — Other Ambulatory Visit: Payer: Self-pay | Admitting: Family Medicine

## 2023-05-03 ENCOUNTER — Encounter: Payer: Self-pay | Admitting: Family Medicine

## 2023-05-03 LAB — COMPREHENSIVE METABOLIC PANEL WITH GFR
ALT: 12 IU/L (ref 0–32)
AST: 16 IU/L (ref 0–40)
Albumin: 3.9 g/dL (ref 3.7–4.7)
Alkaline Phosphatase: 105 IU/L (ref 44–121)
BUN/Creatinine Ratio: 26 (ref 12–28)
BUN: 25 mg/dL (ref 8–27)
Bilirubin Total: 0.4 mg/dL (ref 0.0–1.2)
CO2: 24 mmol/L (ref 20–29)
Calcium: 9.7 mg/dL (ref 8.7–10.3)
Chloride: 103 mmol/L (ref 96–106)
Creatinine, Ser: 0.95 mg/dL (ref 0.57–1.00)
Globulin, Total: 2.8 g/dL (ref 1.5–4.5)
Glucose: 127 mg/dL — ABNORMAL HIGH (ref 70–99)
Potassium: 4.4 mmol/L (ref 3.5–5.2)
Sodium: 140 mmol/L (ref 134–144)
Total Protein: 6.7 g/dL (ref 6.0–8.5)
eGFR: 60 mL/min/{1.73_m2} (ref >59)

## 2023-05-03 LAB — HEMOGLOBIN A1C
Est. average glucose Bld gHb Est-mCnc: 137 mg/dL
Hgb A1c MFr Bld: 6.4 % — ABNORMAL HIGH (ref 4.8–5.6)

## 2023-05-03 LAB — MICROALBUMIN / CREATININE URINE RATIO
Creatinine, Urine: 49 mg/dL
Microalb/Creat Ratio: 194 mg/g{creat} — ABNORMAL HIGH (ref 0–29)
Microalbumin, Urine: 94.9 ug/mL

## 2023-05-11 ENCOUNTER — Other Ambulatory Visit: Payer: Self-pay | Admitting: Obstetrics and Gynecology

## 2023-05-11 DIAGNOSIS — N390 Urinary tract infection, site not specified: Secondary | ICD-10-CM

## 2023-05-16 ENCOUNTER — Ambulatory Visit: Payer: Self-pay

## 2023-05-16 NOTE — Telephone Encounter (Signed)
 2nd attempt to reach patient, received autorecorded message of call cannot be completed at this time.    Message from [place] [facility] sent at 05/16/2023  2:10 PM EDT  Summary: Yeast Infection   Copied From CRM [id number]. Reason for Triage: Red Word that prompted transfer to Nurse Triage: Patient states she has a yeast infection and would like medication called in. She would also like to know what she should do.  Callback #: [id number]  Preferred Pharmacy:  [id number] [address], [place] - [phone] [place] [address] Phone: [phone] Fax: [phone] Hours: Not open 24 hours

## 2023-05-16 NOTE — Telephone Encounter (Signed)
 Third attempt; call cannot be completed as dialed.

## 2023-05-16 NOTE — Telephone Encounter (Signed)
 Summary: Yeast Infection   Copied From CRM [id number]. Reason for Triage: Red Word that prompted transfer to Nurse Triage: Patient states she has a yeast infection and would like medication called in. She would also like to know what she should do.  Callback #: [id number]  Preferred Pharmacy:  [id number] [address], [place] - [phone] [place] [address] Phone: [phone] Fax: [phone] Hours: Not open 24 hours        First attempt; left message.

## 2023-05-17 ENCOUNTER — Other Ambulatory Visit: Payer: Self-pay | Admitting: Family Medicine

## 2023-05-17 ENCOUNTER — Ambulatory Visit: Payer: Self-pay

## 2023-05-17 ENCOUNTER — Ambulatory Visit: Admitting: Family Medicine

## 2023-05-17 DIAGNOSIS — N760 Acute vaginitis: Secondary | ICD-10-CM

## 2023-05-17 MED ORDER — NYSTATIN 100000 UNIT/GM EX OINT: 1.0000 | TOPICAL_OINTMENT | Freq: Two times a day (BID) | CUTANEOUS | 0 refills | Status: AC

## 2023-05-17 NOTE — Telephone Encounter (Signed)
 Medication sent for pt today; pt was appreciative. Will follow up with me in 1 week if no better. 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 Sent in nystatin ointment as she is allergic to cortisone.

## 2023-05-17 NOTE — Telephone Encounter (Signed)
 Copied from CRM [id number]. Topic: Clinical - Pink Word Triage >> May 16, 2023  2:09 PM Higinio Roger wrote: Reason for Triage: Red Word that prompted transfer to Nurse Triage: Patient states she has a yeast infection and would like medication called in. She would also like to know what she should do.  Callback #: [id number]  Preferred Pharmacy:  [id number] [place] [address], [place] - [phone] [place] [address] Phone: [phone] Fax: [phone] Hours: Not open 24 hours >> May 17, 2023  1:02 PM DeAngela L wrote: Patient returning missed call from the NT yesterday   Chief Complaint: vaginal itching Symptoms: itching, cloudy urine Frequency: comes and goes Pertinent Negatives: Patient denies fever, abd pain Disposition: [] ED /[] Urgent Care (no appt availability in office) / [x] Appointment(In office/virtual)/ []  [place] [facility]/ [] Home Care/ [] Refused Recommended Disposition /[] [facility]/ []  Follow-up with PCP Additional Notes: per protocol apt made for tomorrow; care advice given, denies questions; instructed to go to ER if becomes worse.   Reason for Disposition  MODERATE-SEVERE itching (i.e., interferes with school, work, or sleep)  Answer Assessment - Initial Assessment Questions 1. SYMPTOM: "What's the main symptom you're concerned about?" (e.g., pain, itching, dryness)     Itching, discharge, pain, urine cloudy 2. LOCATION: "Where is the  itching located?" (e.g., inside/outside, left/right)     Vagina  3. ONSET: "When did the  itching  start?"     Two days ago 4. PAIN: "Is there any pain?" If Yes, ask: "How bad is it?" (Scale: 1-10; mild, moderate, severe)   -  MILD (1-3): Doesn't interfere with normal activities.    -  MODERATE (4-7): Interferes with normal activities (e.g., work or school) or awakens from sleep.     -  SEVERE (8-10): Excruciating pain, unable to do any normal activities.     8/10 5. ITCHING: "Is there any itching?"  If Yes, ask: "How bad is it?" (Scale: 1-10; mild, moderate, severe)     severe 6. CAUSE: "What do you think is causing the discharge?" "Have you had the same problem before? What happened then?"     Yeast infection 7. OTHER SYMPTOMS: "Do you have any other symptoms?" (e.g., fever, itching, vaginal bleeding, pain with urination, injury to genital area, vaginal foreign body)     denies 8. PREGNANCY: "Is there any chance you are pregnant?" "When was your last menstrual period?"     na  Protocols used: Vaginal Symptoms-A-AH

## 2023-05-18 ENCOUNTER — Other Ambulatory Visit: Payer: Self-pay | Admitting: Obstetrics and Gynecology

## 2023-05-18 ENCOUNTER — Ambulatory Visit: Admitting: Family Medicine

## 2023-05-18 DIAGNOSIS — N3281 Overactive bladder: Secondary | ICD-10-CM

## 2023-05-30 ENCOUNTER — Ambulatory Visit: Payer: Self-pay | Admitting: Family Medicine

## 2023-05-30 ENCOUNTER — Encounter: Payer: Self-pay | Admitting: Family Medicine

## 2023-05-30 ENCOUNTER — Ambulatory Visit (INDEPENDENT_AMBULATORY_CARE_PROVIDER_SITE_OTHER): Admitting: Family Medicine

## 2023-05-30 VITALS — BP 121/60 | HR 81 | Temp 97.8°F | Resp 18 | Ht 64.0 in | Wt 262.0 lb

## 2023-05-30 DIAGNOSIS — R399 Unspecified symptoms and signs involving the genitourinary system: Secondary | ICD-10-CM

## 2023-05-30 LAB — POCT URINALYSIS DIP (CLINITEK)
Glucose, UA: >=1000 mg/dL — AB
Ketones, POC UA: NEGATIVE mg/dL
Nitrite, UA: POSITIVE — AB
POC PROTEIN,UA: 100 — AB
Spec Grav, UA: 1.025 (ref 1.010–1.025)
Urobilinogen, UA: 0.2 U/dL
pH, UA: 5 (ref 5.0–8.0)

## 2023-05-30 MED ORDER — CEPHALEXIN 500 MG PO CAPS: 500.0000 mg | ORAL_CAPSULE | Freq: Two times a day (BID) | ORAL | 0 refills | Status: DC

## 2023-05-30 NOTE — Telephone Encounter (Signed)
 Chief Complaint: Burning with urination x1 week  Symptoms: Urinary frequency, urinary incontinence, burning 8/10 with urination Pertinent Negatives: Patient denies fever, blood in urine, lower back pain   Disposition: [x] Appointment(In office)  Additional Notes: Pt scheduled for an appt today with PCP.    Copied from CRM [id number]. Topic: Clinical - Red Word Triage >> May 30, 2023 10:53 AM Marissa P wrote: Red Word that prompted transfer to Nurse Triage: Patient called in saying that she believes that she may be experiencing a uti. Lots of burning and pain in that area. Reason for Disposition  [1] SEVERE pain with urination (e.g., excruciating) AND [2] not improved after 2 hours of pain medicine and Sitz bath  Answer Assessment - Initial Assessment Questions Chief Complaint: Burning with urination x1 week  Symptoms: Urinary frequency, urinary incontinence, burning 8/10 with urination  Pertinent Negatives: Patient denies fever, blood in urine, lower back pain  Protocols used: Urination Pain - Female-A-AH

## 2023-05-30 NOTE — Progress Notes (Signed)
   Acute Office Visit  Subjective:     Patient ID: [name] [name], female    DOB: [birth year], 83 y.o.   MRN: [id number]  No chief complaint on file.   HPI Patient is in today for acute visit.  Dysuria Pt reports 10 days of dysuria. No hematuria or flank pain. She says her urine is orange and cloudy. She has had lower abdominal pain with the urinary symptoms. No fever.   Review of Systems  Constitutional:  Negative for fever.  Genitourinary:  Positive for dysuria. Negative for flank pain and hematuria.  All other systems reviewed and are negative.      Objective:    There were no vitals taken for this visit. BP Readings from Last 3 Encounters:  05/30/23 121/60  04/24/23 116/68  04/16/23 118/60      Physical Exam Vitals and nursing note reviewed.  Constitutional:      Appearance: Normal appearance. She is normal weight.  HENT:     Head: Normocephalic and atraumatic.     Right Ear: External ear normal.     Left Ear: External ear normal.     Nose: Nose normal.     Mouth/Throat:     Mouth: Mucous membranes are moist.     Pharynx: Oropharynx is clear.  Eyes:     Conjunctiva/sclera: Conjunctivae normal.     Pupils: Pupils are equal, round, and reactive to light.  Cardiovascular:     Rate and Rhythm: Normal rate.  Pulmonary:     Effort: Pulmonary effort is normal.  Skin:    General: Skin is warm.     Capillary Refill: Capillary refill takes less than 2 seconds.  Neurological:     General: No focal deficit present.     Mental Status: She is alert and oriented to person, place, and time. Mental status is at baseline.  Psychiatric:        Mood and Affect: Mood normal.        Behavior: Behavior normal.        Thought Content: Thought content normal.        Judgment: Judgment normal.   No results found for any visits on 05/30/23.      Assessment & Plan:   Problem List Items Addressed This Visit   None Visit Diagnoses       UTI symptoms    -  Primary    Relevant Orders   POCT URINALYSIS DIP (CLINITEK)       No orders of the defined types were placed in this encounter.  UTI symptoms -     POCT URINALYSIS DIP (CLINITEK) -     Urine Culture -     Cephalexin ; Take 1 capsule (500 mg total) by mouth 2 (two) times daily.  Dispense: 14 capsule; Refill: 0   Urine dip + consistent with UTI. Treat with Keflex  500mg  bid x 7 days. Pt reports despite her extensive antibiotic allergy list, she tolerates Keflex .  To send for culture and follow up on results.  No follow-ups on file.  Manette Section, MD

## 2023-06-04 LAB — URINE CULTURE

## 2023-06-06 ENCOUNTER — Ambulatory Visit: Payer: Medicare HMO | Admitting: Pulmonary Disease

## 2023-06-06 ENCOUNTER — Ambulatory Visit: Admitting: Pulmonary Disease

## 2023-06-06 ENCOUNTER — Encounter (HOSPITAL_BASED_OUTPATIENT_CLINIC_OR_DEPARTMENT_OTHER): Payer: Self-pay | Admitting: Pulmonary Disease

## 2023-06-06 ENCOUNTER — Ambulatory Visit: Payer: Self-pay

## 2023-06-06 NOTE — Telephone Encounter (Signed)
 Copied from CRM [id number]. Topic: Clinical - Red Word Triage >> Jun 06, 2023 10:38 AM Hamp Levine R wrote: [facility] that prompted transfer to Nurse Triage: Patient is having pain from her toes and into her heel. States was told is was neuropathy. Would like to speak with someone regarding the pain and possibly changing her medication.   Chief Complaint: Foot Pain, Right Foot and Heel Symptoms: Pain  Frequency: 2 Weeks  Pertinent Negatives: Patient denies chest pain, dyspnea, dizziness  Disposition: [] ED /[] Urgent Care (no appt availability in office) / [x] Appointment(In office/virtual)/ []  [facility]/ [] Home Care/ [] Refused Recommended Disposition /[] [facility]/ []  Follow-up with PCP Additional Notes: MB is being triaged for pain in her right foot and heel. The patient was taking Gabapentin  and states it is not helping her pain. Unsure of the dose. Patient stated she could not come in until next week and to schedule her appropriately.   Reason for Disposition  [1] MODERATE pain (e.g., interferes with normal activities, limping) AND [2] present > 3 days  Answer Assessment - Initial Assessment Questions 1. ONSET: "When did the pain start?"      Two weeks ago  2. LOCATION: "Where is the pain located?"      Right Foot  3. PAIN: "How bad is the pain?"    (Scale 1-10; or mild, moderate, severe)  - MILD (1-3): doesn't interfere with normal activities.   - MODERATE (4-7): interferes with normal activities (e.g., work or school) or awakens from sleep, limping.   - SEVERE (8-10): excruciating pain, unable to do any normal activities, unable to walk.      Moderate  4. WORK OR EXERCISE: "Has there been any recent work or exercise that involved this part of the body?"      No  5. CAUSE: "What do you think is causing the foot pain?"     Neuropathy  6. OTHER SYMPTOMS: "Do you have any other symptoms?" (e.g., leg pain, rash, fever, numbness)     Pain, numbness,  tingling  7. PREGNANCY: "Is there any chance you are pregnant?" "When was your last menstrual period?"     No and No  Protocols used: Foot Pain-A-AH

## 2023-06-11 ENCOUNTER — Encounter (HOSPITAL_BASED_OUTPATIENT_CLINIC_OR_DEPARTMENT_OTHER): Admitting: Pulmonary Disease

## 2023-06-11 ENCOUNTER — Ambulatory Visit: Admitting: Family Medicine

## 2023-06-11 NOTE — Telephone Encounter (Signed)
 Copied from CRM [id number]. Topic: Appointments - Appointment Cancel/Reschedule >> Jun 11, 2023  9:53 AM [name] [name] wrote: Patient/patient representative is calling to cancel or  an appointment. Refer to attachments for appointment information. States that she did not have a ride will call back to reschedule

## 2023-06-11 NOTE — Telephone Encounter (Signed)
 Canceled the appointment due to the patient calling ahead to cancel.

## 2023-06-14 ENCOUNTER — Other Ambulatory Visit (HOSPITAL_BASED_OUTPATIENT_CLINIC_OR_DEPARTMENT_OTHER): Payer: Self-pay | Admitting: Family Medicine

## 2023-06-21 ENCOUNTER — Other Ambulatory Visit: Payer: Self-pay | Admitting: Family Medicine

## 2023-06-21 DIAGNOSIS — R399 Unspecified symptoms and signs involving the genitourinary system: Secondary | ICD-10-CM

## 2023-06-21 NOTE — Telephone Encounter (Signed)
 Copied from CRM [id number]. Topic: Clinical - Medication Refill >> Jun 21, 2023 10:58 AM Lynnie Saucier S wrote: Medication: cephALEXin  (KEFLEX ) 500 MG capsule  Has the patient contacted their pharmacy? No (Agent: If no, request that the patient contact the pharmacy for the refill. If patient does not wish to contact the pharmacy document the reason why and proceed with request.) (Agent: If yes, when and what did the pharmacy advise?)  This is the patient's preferred pharmacy:  [id number] - [address] [place] [address] Phone: [phone] Fax: [phone]   Is this the correct pharmacy for this prescription? Yes If no, delete pharmacy and type the correct one.   Has the prescription been filled recently? No  Is the patient out of the medication? Yes  Has the patient been seen for an appointment in the last year OR does the patient have an upcoming appointment? Yes  Can we respond through MyChart? Yes  Agent: Please be advised that Rx refills may take up to 3 business days. We ask that you follow-up with your pharmacy.

## 2023-06-22 ENCOUNTER — Other Ambulatory Visit: Payer: Self-pay | Admitting: Family Medicine

## 2023-06-22 DIAGNOSIS — R399 Unspecified symptoms and signs involving the genitourinary system: Secondary | ICD-10-CM

## 2023-06-22 DIAGNOSIS — E1165 Type 2 diabetes mellitus with hyperglycemia: Secondary | ICD-10-CM

## 2023-06-26 ENCOUNTER — Other Ambulatory Visit: Payer: Self-pay | Admitting: Family Medicine

## 2023-06-26 DIAGNOSIS — E1159 Type 2 diabetes mellitus with other circulatory complications: Secondary | ICD-10-CM

## 2023-06-27 ENCOUNTER — Other Ambulatory Visit: Payer: Self-pay | Admitting: Family Medicine

## 2023-06-27 ENCOUNTER — Ambulatory Visit (HOSPITAL_COMMUNITY)
Admission: RE | Admit: 2023-06-27 | Discharge: 2023-06-27 | Disposition: A | Discharge status: Home / Self Care | Source: Ambulatory Visit | Attending: Acute Care | Admitting: Acute Care

## 2023-06-27 DIAGNOSIS — E1169 Type 2 diabetes mellitus with other specified complication: Secondary | ICD-10-CM

## 2023-06-27 DIAGNOSIS — I2510 Atherosclerotic heart disease of native coronary artery without angina pectoris: Secondary | ICD-10-CM

## 2023-06-27 DIAGNOSIS — E1159 Type 2 diabetes mellitus with other circulatory complications: Secondary | ICD-10-CM

## 2023-06-27 DIAGNOSIS — R0609 Other forms of dyspnea: Secondary | ICD-10-CM | POA: Diagnosis present

## 2023-06-27 DIAGNOSIS — E1165 Type 2 diabetes mellitus with hyperglycemia: Secondary | ICD-10-CM

## 2023-06-27 LAB — PULMONARY FUNCTION TEST
DL/VA % pred: 89 %
DL/VA: 3.63 ml/min/mmHg/L
DLCO unc % pred: 54 %
DLCO unc: 10.26 ml/min/mmHg
FEF 25-75 Post: 1.15 L/s
FEF 25-75 Pre: 0.71 L/s
FEF2575-%Change-Post: 61 %
FEF2575-%Pred-Post: 87 %
FEF2575-%Pred-Pre: 54 %
FEV1-%Change-Post: 16 %
FEV1-%Pred-Post: 55 %
FEV1-%Pred-Pre: 47 %
FEV1-Post: 1.03 L
FEV1-Pre: 0.88 L
FEV1FVC-%Change-Post: 5 %
FEV1FVC-%Pred-Pre: 105 %
FEV6-%Change-Post: 11 %
FEV6-%Pred-Post: 53 %
FEV6-%Pred-Pre: 48 %
FEV6-Post: 1.27 L
FEV6-Pre: 1.14 L
FEV6FVC-%Pred-Post: 106 %
FEV6FVC-%Pred-Pre: 106 %
FVC-%Change-Post: 11 %
FVC-%Pred-Post: 50 %
FVC-%Pred-Pre: 45 %
FVC-Post: 1.27 L
FVC-Pre: 1.14 L
Post FEV1/FVC ratio: 81 %
Post FEV6/FVC ratio: 100 %
Pre FEV1/FVC ratio: 77 %
Pre FEV6/FVC Ratio: 100 %
RV % pred: 74 %
RV: 1.82 L
TLC % pred: 63 %
TLC: 3.22 L

## 2023-06-27 MED ORDER — AMLODIPINE-VALSARTAN-HCTZ 10-320-25 MG PO TABS: 1.0000 | ORAL_TABLET | Freq: Every day | ORAL | 1 refills | Status: DC

## 2023-06-27 MED ORDER — ALBUTEROL SULFATE (2.5 MG/3ML) 0.083% IN NEBU
2.5000 mg | INHALATION_SOLUTION | Freq: Once | RESPIRATORY_TRACT | Status: AC
  Administered 2023-06-27: 2.5 mg via RESPIRATORY_TRACT

## 2023-06-27 NOTE — Telephone Encounter (Signed)
 Copied from CRM [id number]. Topic: Clinical - Medication Refill >> Jun 27, 2023  9:15 AM Carlatta H wrote: Medication: amLODIPine -Valsartan -HCTZ 10-320-25 MG TABS [[id number]]  Has the patient contacted their pharmacy? No (Agent: If no, request that the patient contact the pharmacy for the refill. If patient does not wish to contact the pharmacy document the reason why and proceed with request.) (Agent: If yes, when and what did the pharmacy advise?)  This is the patient's preferred pharmacy:  [id number] - [address] [place] [address] Phone: [phone] Fax: [phone]    Is this the correct pharmacy for this prescription? Yes If no, delete pharmacy and type the correct one.   Has the prescription been filled recently? No  Is the patient out of the medication? Yes  Has the patient been seen for an appointment in the last year OR does the patient have an upcoming appointment? No  Can we respond through MyChart? Yes  Agent: Please be advised that Rx refills may take up to 3 business days. We ask that you follow-up with your pharmacy.

## 2023-06-28 ENCOUNTER — Ambulatory Visit (HOSPITAL_BASED_OUTPATIENT_CLINIC_OR_DEPARTMENT_OTHER): Attending: Pulmonary Disease | Admitting: Pulmonary Disease

## 2023-06-28 DIAGNOSIS — G4700 Insomnia, unspecified: Secondary | ICD-10-CM | POA: Insufficient documentation

## 2023-06-28 DIAGNOSIS — Z9981 Dependence on supplemental oxygen: Secondary | ICD-10-CM | POA: Insufficient documentation

## 2023-06-28 DIAGNOSIS — G4733 Obstructive sleep apnea (adult) (pediatric): Secondary | ICD-10-CM | POA: Insufficient documentation

## 2023-06-28 DIAGNOSIS — J449 Chronic obstructive pulmonary disease, unspecified: Secondary | ICD-10-CM | POA: Diagnosis not present

## 2023-06-29 ENCOUNTER — Telehealth: Payer: Self-pay | Admitting: Pulmonary Disease

## 2023-06-29 DIAGNOSIS — G4733 Obstructive sleep apnea (adult) (pediatric): Secondary | ICD-10-CM | POA: Diagnosis not present

## 2023-06-29 NOTE — Telephone Encounter (Signed)
 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, severe oxygen desaturations requiring 4 L of oxygen to maintain saturations greater than 88% Sleep onset and sleep maintenance insomnia Severe oxygen desaturations likely related to underlying severe obstructive lung disease  Recommendation: DME referral  Recommend CPAP therapy for mild obstructive sleep apnea with severe oxygen desaturations  Auto CPAP 5-15 with heated humidification with patient's mask of choice with 4 L of oxygen piped into the system  Encourage weight loss measures  Close clinical follow-up with compliance monitoring for optimization of therapy  Follow-up in the office 4 to 6 weeks following initiation of treatment

## 2023-06-29 NOTE — Procedures (Signed)
 [facility] [address] [place], [place] [address] Tel: [phone]   Fax: [phone]  Polysomnography Interpretation  Patient Name:  [name] [name], [name] [name] Date:  06/28/2023 Referring Physician:  Myer Artis MD  Indications for Polysomnography The patient is a 83 year-old Female who is 5\' 4"  and weighs 250.0 lbs. Her BMI equals 43.2.  A full night polysomnogram was performed to evaluate for -.  Medication  No Data.   Polysomnogram Data A full night polysomnogram recorded the standard physiologic parameters including EEG, EOG, EMG, EKG, nasal and oral airflow.  Respiratory parameters of chest and abdominal movements were recorded with Respiratory Inductance Plethysmography belts.  Oxygen saturation was recorded by pulse oximetry.   Sleep Architecture The total recording time of the polysomnogram was 390.2 minutes.  The total sleep time was 189.0 minutes.  The patient spent 2.1% of total sleep time in Stage N1, 97.9% in Stage N2, 0.0% in Stages N3, and 0.0% in REM.  Sleep latency was 119.2 minutes.  REM latency was - minutes.  Sleep Efficiency was 48.4%.  Wake after Sleep Onset time was 82.5 minutes.  Respiratory Events The polysomnogram revealed a presence of 15 obstructive, - central, and - mixed apneas resulting in an Apnea index of 4.8 events per hour.  There were 32 hypopneas (>=3% desaturation and/or arousal) resulting in an Apnea\Hypopnea Index (AHI >=3% desaturation and/or arousal) of 14.9 events per hour.  There were 12 hypopneas (>=4% desaturation) resulting in an Apnea\Hypopnea Index (AHI >=4% desaturation) of 8.6 events per hour.  There were - Respiratory Effort Related Arousals resulting in a RERA index of - events per hour. The Respiratory Disturbance Index is 14.9 events per hour.  The snore index was - events per hour.  Mean oxygen saturation was 85.1%.  The lowest oxygen saturation during sleep was 83.0%.  Time spent <=88% oxygen  saturation was 362.0 minutes (93.1%).  End Tidal CO2 during sleep ranged from - to - mmHg. End Tidal CO2 was greater than 50 mmHg for - minutes and greater than 55 mmHg for - minutes.  Limb Activity There were - total limb movements recorded, of this total, - were classified as PLMs.  PLM index was - per hour and PLM associated with Arousals index was - per hour.  Cardiac Summary The average pulse rate was 84.3 bpm.  The minimum pulse rate was 66.0 bpm while the maximum pulse rate was 173.0 bpm.  Cardiac rhythm was normal/abnormal.  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 Known underlying history of obstructive sleep apnea, compliant with CPAP therapy Required oxygen supplementation at 4 L to maintain saturations above 88% No significant dysrhythmia  Recommendations: Auto CPAP 5-15 with heated humidification with patient's mask of choice 4 L of oxygen piped into the system Further evaluation of contributors and optimal management of insomnia Close clinical follow-up with compliance monitoring for optimal treatment  This study was personally reviewed and electronically signed by: Myer Artis MD Accredited Board Certified in Sleep Medicine Date/Time:  06/29/23       Diagnostic PSG Report  Patient Name: [name] [name], [name] [name] Date: 06/28/2023  Date of Birth: 10-18-1940 Study Type: Diagnostic  Age: 38 year MRN #: [id number]  Sex: Female Interpreting Physician: Myer Artis [id number]  Height: 5\' 4"  Referring Physician: Myer Artis MD  Weight: 250.0 lbs Recording Tech: Metro Acron RPSGT RST  BMI: 43.2 Scoring Tech: Metro Acron RPSGT RST  ESS: 5 Neck Size: 15  Mask Type -    Mask Size: - Supplemental O2: 4 Liters   Study Overview  Lights Off: 09:52:58 PM  Count Index  Lights On: 04:23:09 AM  Awakenings: 20 6.3  Time in Bed: 390.2 min. Arousals: 10 3.2  Total Sleep Time: 189.0 min. AHI (>=3% Desat and/or Ar.): 47 14.9   Sleep Efficiency: 48.4% AHI (>=4% Desat): 27 8.6   Sleep Latency: 119.2 min. Limb Movements: - -  Wake After Sleep Onset: 82.5 min. Snore: - -  REM Latency from Sleep Onset: - min. Desaturations: 50 15.9     Minimum SpO2 TST: 83.0%    Sleep Architecture  % of Time in Bed Stages Time (mins) % Sleep Time  Wake 202.0   Stage N1 4.0 2.1%  Stage N2 185.0 97.9%  Stage N3 0.0 0.0%  REM 0.0 0.0%   Arousal Summary   NREM REM Sleep Index  Respiratory Arousals 6 - 6 1.9  PLM Arousals - - - -  Isolated Limb Movement Arousals - - - -  Snore Arousals - - - -  Spontaneous Arousals 4 - 4 1.3  Total 10 - 10 3.2   Limb Movement Summary   Count Index  Isolated Limb Movements - -  Periodic Limb Movements (PLMs) - -  Total Limb Movements - -    Respiratory Summary   By Sleep Stage By Body Position Total   NREM REM Supine Non-Supine   Time (min) 189.0 0.0 189.0 - 189.0         Obstructive Apnea 15 - 15 - 15  Mixed Apnea - - - - -  Central Apnea - - - - -  Total Apneas 15 - 15 - 15  Total Apnea Index 4.8 - 4.8 - 4.8         Hypopneas (>=3% Desat and/or Ar.) 32 - 32 - 32  AHI (>=3% Desat and/or Ar.) 14.9 - 14.9 - 14.9         Hypopneas (>=4% Desat) 12 - 12 - 12  AHI (>=4% Desat) 8.6 - 8.6 - 8.6          RERAs - - - - -  RERA Index - - - - -         RDI 14.9 - 14.9 - 14.9    Respiratory Event Type Index  Central Apneas -  Obstructive Apneas 4.8  Mixed Apneas -  Central Hypopneas -  Obstructive Hypopneas 10.2  Central Apnea + Hypopnea (CAHI) -  Obstructive Apnea + Hypopnea (OAHI) 14.9   Respiratory Event Durations   Apnea Hypopnea   NREM REM NREM REM  Average (seconds) 30.4 - 37.3 -  Maximum (seconds) 48.0 - 65.6 -    Oxygen Saturation Summary   Wake NREM REM TST TIB  Average SpO2 (%) 83.2% 87.2% - 87.2% 85.1%  Minimum SpO2 (%) 74.0%  83.0% - 83.0% 74.0%  Maximum SpO2 (%) 92.0% 91.0% - 91.0% 92.0%   Oxygen Saturation Distribution  Range (%) Time in range (min) Time in range (%)  90.0 - 100.0 0.3 0.1%  80.0 - 90.0 322.2 82.9%  70.0 - 80.0 64.9 16.7%  60.0 - 70.0 - -  50.0 - 60.0 - -  0.0 - 50.0 - -  Time Spent <=88% SpO2  Range (%) Time in range (min) Time in range (%)  0.0 - 88.0 362.0 93.1%      Count  Index  Desaturations 50 15.9    Cardiac Summary   Wake NREM REM Sleep Total  Average Pulse Rate (BPM) 86.2 82.3 - 82.3 84.3  Minimum Pulse Rate (BPM) 66.0 66.0 - 66.0 66.0  Maximum Pulse Rate (BPM) 173.0 93.0 - 93.0 173.0   Pulse Rate Distribution:  Range (bpm) Time in range (min) Time in range (%)  0.0 - 40.0 - -  40.0 - 60.0 - -  60.0 - 80.0 75.8 19.5%  80.0 - 100.0 312.8 80.5%  100.0 - 120.0 - -  120.0 - 140.0 - -  140.0 - 200.0 0.1 0.0%   EtCO2 Summary  Stage Min (mmHg) Average (mmHg) Max (mmHg)  Wake - - -  NREM(1+2+3) - - -  REM - - -   EtCO2 Distribution:  Range (mmHg) Time in range (min) Time in range (%)  20.0 - 40.0 - -  40.0 - 50.0 - -  50.0 - 100.0 - -  55.0 - 100.0 - -  Excluded data <20.0 & >65.0 391.0 100.0%   Supplemental O2 Summary  O2 Level Time (min) Wake (min) NREM (min) REM (min) Sleep Eff% OA# CA# MA# Hyp# (>=3%) AHI (>=3%) Hyp# (>=4%) AHI (>=%4) RERA RDI SpO2 <=88% (min) Min SpO2 Mean SpO2 Ar. Index  - 116.0 116.0 0.0 0.0 0.0%               O2: 2 142.0 49.0 93.0 0.0 65.5% 9 - - 20 18.7 7  10.3 -  18.7  84.8 83.0 87.2 2.6  O2: 3 110.0 37.0 73.0 0.0 66.4% 6 - - 12 14.8 5  9.0 -  14.8  69.5 83.0 86.8 4.9  O2: 4 23.0 0.0 23.0 0.0 100.0% - - - - - -  - -  -  12.1 87.0 88.5 -   Hypnograms                         Technologist Comments  STUDY TYPE= PSG DATE= 06/28/2023 Oxygen= 4 LITERS  Patient Present here for Baseline study. Tech Explained the Process of Hook up and Wires. Study was started according to order. Patient had difficulty  falling and Maintaining sleep.  Patient was restless all through the night. The Patient talked in her sleep. 4 Liters of oxygen was applied due to low saturation. Patient slept supine through the night. No Bathroom break. No Medication was taking at the lab.  Tech observed Mild Sleep Disorder Breathing as the study progressed through the night.

## 2023-07-03 NOTE — Telephone Encounter (Signed)
 Lm for patient.

## 2023-07-04 ENCOUNTER — Telehealth: Payer: Self-pay | Admitting: *Deleted

## 2023-07-04 NOTE — Telephone Encounter (Signed)
 Copied from CRM [id number]. Topic: Clinical - Lab/Test Results >> Jul 03, 2023  5:18 PM Eveleen Hinds B wrote: Reason for CRM: Returning a call to [facility] regarding Sleep results.  Please call.  Duplicate  See phone note 06/29/23

## 2023-07-04 NOTE — Telephone Encounter (Signed)
 I called and spoke with the pt and notified of results and recommendations  She verbalized understanding  She is aware to keep ov with Dr Gaynell Keeler 08/22/23  DME referral placed

## 2023-07-11 ENCOUNTER — Telehealth: Payer: Self-pay | Admitting: Pulmonary Disease

## 2023-07-11 NOTE — Telephone Encounter (Signed)
 Patient was scheduled for split-night study but study was limited by severe insomnia both sleep onset and sleep maintenance insomnia, did show evidence of significant sleep disordered breathing and severe oxygen desaturations requiring 4 L of oxygen to maintain saturations.  Desaturations not related to sleep disordered breathing events-related to obstructive lung disease  Has been on CPAP, tolerates CPAP well, needs oxygen supplementation added to the CPAP.

## 2023-07-11 NOTE — Telephone Encounter (Signed)
 Spoke with mitch going to look into titration

## 2023-07-11 NOTE — Telephone Encounter (Signed)
 Per Mitch with [employer]- Patient had a CPAP order and an O2 order. The Dx is OSA and we cannot find a titration study used to qualify her for O2. The CPAP is fine but the only sleep study that I see is her diagnostic PSG. If I am missing the titration please help me locate it. Once we get her titration report we can qualify her for nocturnal O2 and deliver.    We are unable to find the titration report on our end. Please advise.

## 2023-07-12 ENCOUNTER — Other Ambulatory Visit: Payer: Self-pay | Admitting: Pulmonary Disease

## 2023-07-12 DIAGNOSIS — G4733 Obstructive sleep apnea (adult) (pediatric): Secondary | ICD-10-CM

## 2023-07-12 NOTE — Telephone Encounter (Signed)
 New order placed NFN

## 2023-07-12 NOTE — Telephone Encounter (Signed)
 ATC X1. LMTCB. The O2 order with CPAP was already placed by Dr Gaynell Keeler on 07-04-23. Per Shirline Dover with [facility], it was received. ( Look at the notes on the order) I will send pt a Mychart message so she is aware of Dr Alyne Jules note. NFN

## 2023-07-12 NOTE — Telephone Encounter (Signed)
 Most likely will need titration report  due to insurance

## 2023-07-13 ENCOUNTER — Encounter: Payer: Self-pay | Admitting: Pulmonary Disease

## 2023-07-19 ENCOUNTER — Ambulatory Visit (HOSPITAL_BASED_OUTPATIENT_CLINIC_OR_DEPARTMENT_OTHER): Payer: Self-pay | Admitting: Orthopaedic Surgery

## 2023-07-19 ENCOUNTER — Encounter (HOSPITAL_BASED_OUTPATIENT_CLINIC_OR_DEPARTMENT_OTHER): Payer: Self-pay

## 2023-07-19 ENCOUNTER — Telehealth: Payer: Self-pay | Admitting: Pulmonary Disease

## 2023-07-19 ENCOUNTER — Ambulatory Visit (HOSPITAL_BASED_OUTPATIENT_CLINIC_OR_DEPARTMENT_OTHER): Admitting: Orthopaedic Surgery

## 2023-07-19 DIAGNOSIS — M19012 Primary osteoarthritis, left shoulder: Secondary | ICD-10-CM | POA: Diagnosis not present

## 2023-07-19 NOTE — Progress Notes (Signed)
 Chief Complaint: Left shoulder pain     History of Present Illness:   07/19/2023: Presents today for additional follow-up of the left shoulder.  Unfortunately she did have an allergic reaction to the steroids and did not get significant relief.  She continues to have pain with overhead motion  [name] [name] is a 83 y.o. female presents today with continued left shoulder pain.  She has been having this for the last several years although this is worsening over the course of last several months.  She has had previous injections in the left shoulder.  She had this several months ago without any relief she has not had any previous physical therapy.  Her overhead motion has been quite limited and she is having a difficult time using the side to brush her hair.  She is right-hand dominant.    PMH/PSH/Family History/Social History/Meds/Allergies:    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   Cataract    Closed fracture of upper end of humerus 11/03/2017   COPD (chronic obstructive pulmonary disease) (HCC)    Depression    Diabetes 1.5, managed as type 2 (HCC)    Encounter to establish care 06/03/2020   Essential hypertension 05/19/2019   Female stress incontinence 02/14/2018   GERD (gastroesophageal reflux disease)    History of opioid abuse (HCC) 05/19/2019   Hyperglycemia 05/19/2019   Hyperlipidemia    Hypertension    Menopause present 02/11/2018   Need for shingles vaccine 07/01/2020   Osteoporosis    Sleep apnea    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   Past Surgical History:  Procedure Laterality Date   ABDOMINAL HYSTERECTOMY  1980   BREAST EXCISIONAL BIOPSY     1990's   CHOLECYSTECTOMY  1980   MELANOMA  EXCISION Right    Great Toe   REPLACEMENT TOTAL KNEE Bilateral 2000   sacral neuromodulation     Social History   Socioeconomic History   Marital status: Widowed    Spouse name: Not on file   Number of children: 3   Years of education: Not on file   Highest education level: Not on file  Occupational History   Occupation: Retired  Tobacco Use   Smoking status: Former    Current packs/day: 0.00    Types: Cigarettes    Start date: 1957    Quit date: 1988    Years since quitting: 37.4   Smokeless tobacco: Never  Vaping Use   Vaping status: Never Used  Substance and Sexual Activity   Alcohol use: Not Currently   Drug use: Never   Sexual activity: Not Currently  Other Topics Concern   Not on file  Social History Narrative   Not on file   Social Drivers of Health   Financial Resource Strain: Low Risk  (04/24/2023)   Overall Financial Resource Strain (CARDIA)    Difficulty of Paying Living Expenses: Not hard at all  Food Insecurity: No Food Insecurity (04/24/2023)   Hunger Vital Sign    Worried About Running Out of Food in the Last Year: Never true    Ran Out of Food in the Last Year: Never true  Transportation Needs: No Transportation Needs (04/24/2023)  PRAPARE - Administrator, Civil Service (Medical): No    Lack of Transportation (Non-Medical): No  Physical Activity: Insufficiently Active (04/24/2023)   Exercise Vital Sign    Days of Exercise per Week: 7 days    Minutes of Exercise per Session: 20 min  Stress: No Stress Concern Present (04/24/2023)   [employer] of Occupational Health - Occupational Stress Questionnaire    Feeling of Stress : Not at all  Social Connections: Unknown (11/30/2022)   Received from [facility]   Social Network    Social Network: Not on file   Family History  Problem Relation Age of Onset   Cancer Mother    Intellectual disability Mother    Cancer Father    Early death Father    Hypertension Father    Breast  cancer Sister    Cancer Sister    Arthritis Maternal Grandmother    Asthma Paternal Grandmother    Asthma Paternal Grandfather    Colon cancer Neg Hx    Stomach cancer Neg Hx    Esophageal cancer Neg Hx    Colon polyps Neg Hx    Allergies  Allergen Reactions   Azithromycin Anaphylaxis and Hives   Bee Venom Anaphylaxis   Erythromycin Base Anaphylaxis   Metronidazole Anaphylaxis   Nitrofurantoin Macrocrystal Anaphylaxis   Penicillins Anaphylaxis and Hives    childhood   Sulfa Antibiotics Anaphylaxis and Hives    Childhood   Cortisone     Other Reaction(s): Not available   Cymbalta [Duloxetine Hcl]    Ivp Dye [Iodinated Contrast Media]    Penicillin G Hives   Codeine Rash   Erythromycin Rash and Hives   Fosfomycin Rash   Levofloxacin Rash   Nitrofurantoin Hives   Percocet [Oxycodone-Acetaminophen ] Rash   Current Outpatient Medications  Medication Sig Dispense Refill   albuterol  (VENTOLIN  HFA) 108 (90 Base) MCG/ACT inhaler INHALE 1-2 PUFFS INTO THE LUNGS EVERY 4 HOURS AS NEEDED FOR WHEEZING OR SHORTNESS OF BREATH. 18 each 1   ALPRAZolam  (XANAX ) 0.25 MG tablet TAKE (1) TABLET BY MOUTH TWICE DAILY AT 11AM & 5PM FOR ANXIETY. (Patient taking differently: Take 0.25 mg by mouth 2 (two) times daily. 1100 and 1500 And an extra 0.25mg  oral twice daily as needed for anxiety) 60 tablet 0   AMBULATORY NON FORMULARY MEDICATION One four wheel rolling walker with seat and hand brakes. Brand/style covered by insurance. Medically necessary for ICD-10: R26.81, Z74.1 1 Units 0   amLODIPine -Valsartan -HCTZ 10-320-25 MG TABS Take 1 tablet by mouth daily. 90 tablet 1   ARTHRITIS PAIN RELIEVING 0.075 % topical cream Apply 1 application topically 3 (three) times daily as needed (neuropathic pain in feet).     atorvastatin  (LIPITOR) 10 MG tablet TAKE 1 TABLET BY MOUTH EVERY DAY 90 tablet 1   Bacillus Coagulans-Inulin (PROBIOTIC FORMULA) 1-250 BILLION-MG CAPS TAKE 1 CAPSULE (250MG ) BY MOUTH 2 TIMES A DAY.  60 capsule 11   blood glucose meter kit and supplies KIT Meter, test strips, lancets, and alcohol swabs. Use for testing blood sugar every morning before meals and up to 3 additional times per day as needed. Brand based on patient and insurance coverage. 100 strips with 99 refills, 100 lancets with 99 refills, 200 alcohol swabs with 99 refills. Dx: E11.65 1 each 99   camphor-menthol (SARNA) lotion Apply 1 application topically as needed for itching. (Patient taking differently: Apply 1 application  topically 2 (two) times daily as needed for itching.) 222 mL 0  carvedilol  (COREG ) 6.25 MG tablet TAKE 1 TABLET BY MOUTH 2 TIMES DAILY WITH A MEAL. 180 tablet 1   cephALEXin  (KEFLEX ) 500 MG capsule Take 1 capsule (500 mg total) by mouth 2 (two) times daily. 14 capsule 0   cetirizine (ZYRTEC) 10 MG tablet TAKE 1 TABLET BY MOUTH EVERY DAY 90 tablet 3   cholestyramine  (QUESTRAN ) 4 g packet Take 1 packet (4 g total) by mouth 2 (two) times daily. 60 packet 0   cloNIDine  (CATAPRES ) 0.1 MG tablet Take 1 tablet (0.1 mg total) by mouth 3 (three) times daily as needed (systolic blood pressurre above 190). 15 tablet 0   colchicine -probenecid  0.5-500 MG tablet Take 1 tablet by mouth daily. 90 tablet 1   diclofenac  Sodium (VOLTAREN  ARTHRITIS PAIN) 1 % GEL Apply 2 g topically 4 (four) times daily. 100 g 1   doxycycline  (VIBRA -TABS) 100 MG tablet Take 1 tablet (100 mg total) by mouth 2 (two) times daily. 14 tablet 0   EASYMAX TEST test strip      famotidine  (PEPCID ) 20 MG tablet Take 1 tablet (20 mg total) by mouth daily as needed for heartburn or indigestion. 90 tablet 3   FARXIGA  10 MG TABS tablet TAKE 1 TABLET BY MOUTH DAILY BEFORE BREAKFAST. 90 tablet 1   ferrous sulfate  325 (65 FE) MG tablet TAKE 1 TABLET BY MOUTH EVERY DAY 90 tablet 2   Fluticasone -Umeclidin-Vilant (TRELEGY ELLIPTA ) 100-62.5-25 MCG/ACT AEPB INHALE 1 PUFF BY MOUTH INTO THE LUNGS DAILY. ** RINSE MOUTH AFTER USE** 60 each 5   gabapentin  (NEURONTIN )  300 MG capsule Take 300 mg by mouth 3 (three) times daily.     hydrALAZINE  (APRESOLINE ) 25 MG tablet TAKE 1 TABLET BY MOUTH TWICE A DAY 180 tablet 1   Lactase 9000 units TABS Take one tablet with the first bite of meals containing milk or dairy products. (Patient taking differently: Take 9,000 Units by mouth See admin instructions. Take 9000units with the first bite of meals containing milk or dairy products.) 90 tablet 11   lidocaine  (LIDODERM ) 5 % Place 1 patch onto the skin daily. Remove & Discard patch within 12 hours or as directed by MD 9 patch 0   methenamine  (HIPREX ) 1 g tablet Take 1 tablet (1 g total) by mouth 2 (two) times daily with a meal. 60 tablet 11   methylPREDNISolone  (MEDROL  DOSEPAK) 4 MG TBPK tablet Take per packet instructions 1 each 0   mirabegron  ER (MYRBETRIQ ) 25 MG TB24 tablet TAKE 1 TABLET (25 MG TOTAL) BY MOUTH DAILY. 90 tablet 2   nystatin  ointment (MYCOSTATIN ) Apply 1 Application topically 2 (two) times daily. 30 g 0   PARoxetine  (PAXIL ) 40 MG tablet Take 40 mg by mouth daily.     psyllium (METAMUCIL) 58.6 % packet Take 1 packet by mouth daily. 30 each 12   YUVAFEM  10 MCG TABS vaginal tablet PLACE 1 TABLET VAGINALLY TWICE A WEEK. 24 tablet 2   No current facility-administered medications for this visit.   No results found.  Review of Systems:   A ROS was performed including pertinent positives and negatives as documented in the HPI.  Physical Exam :   Constitutional: NAD and appears stated age Neurological: Alert and oriented Psych: Appropriate affect and cooperative There were no vitals taken for this visit.   Comprehensive Musculoskeletal Exam:    Left shoulder with active and passive forward elevation to 100 degrees.  External rotation at the side is to 20 degrees.  Internal rotation is to L5  compared to contralateral 160 degrees with external rotation at the side to 45.  Positive crepitus and pain about the left shoulder   Imaging:   Xray (3 views left  shoulder): Severe left glenohumeral osteoarthritis    I personally reviewed and interpreted the radiographs.   Assessment and Plan:   83 y.o. female with severe left glenohumeral osteoarthritis.  At today's visit I discussed treatment options.  She is on multiple injections.  I did discuss the possibility of an additional injection although I was less hopeful about the longevity of this.  At this time given the fact that she is not receiving any type of relief from glenohumeral injections we did discuss the possibility of reverse shoulder arthroplasty.  I discussed the risk and limitations.  I did discuss the recovery timeframe.  I did discuss all possible complications.  After discussion she would like to proceed  -Plan for left shoulder reverse shoulder arthroplasty   After a lengthy discussion of treatment options, including risks, benefits, alternatives, complications of surgical and nonsurgical conservative options, the patient elected surgical repair.   The patient  is aware of the material risks  and complications including, but not limited to injury to adjacent structures, neurovascular injury, infection, numbness, bleeding, implant failure, thermal burns, stiffness, persistent pain, failure to heal, disease transmission from allograft, need for further surgery, dislocation, anesthetic risks, blood clots, risks of death,and others. The probabilities of surgical success and failure discussed with patient given their particular co-morbidities.The time and nature of expected rehabilitation and recovery was discussed.The patient's questions were all answered preoperatively.  No barriers to understanding were noted. I explained the natural history of the disease process and Rx rationale.  I explained to the patient what I considered to be reasonable expectations given their personal situation.  The final treatment plan was arrived at through a shared patient decision making process  model.    I personally saw and evaluated the patient, and participated in the management and treatment plan.  Wilhelmenia Harada, MD Attending Physician, Orthopedic Surgery  This document was dictated using Dragon voice recognition software. A reasonable attempt at proof reading has been made to minimize errors.

## 2023-07-19 NOTE — Telephone Encounter (Signed)
 Spoke with patient about sleep study   Pt does not want to have another one as she had one in may.   Also pts son sent a message asking why another one was needed.

## 2023-07-20 ENCOUNTER — Other Ambulatory Visit: Payer: Self-pay | Admitting: Family Medicine

## 2023-07-20 DIAGNOSIS — R399 Unspecified symptoms and signs involving the genitourinary system: Secondary | ICD-10-CM

## 2023-07-20 NOTE — Telephone Encounter (Signed)
 Called the pt and there was no answer- LMTCB   It looks like Dr. Gaynell Keeler has replied to her questions via mychart, but she has not read it yet and I would like to make sure she understands:    You did not sleep very well during your sleep study and your oxygen was very low was why they placed you on oxygen.     We know you have an underlying history of sleep apnea-but you did not sleep very well during the study to capture many events     Insurance will not pay for a CPAP with oxygen piped into it without that being confirmed in the sleep lab.  Just a rule that has to be followed

## 2023-07-20 NOTE — Telephone Encounter (Signed)
 Copied from CRM [id number]. Topic: Clinical - Medication Refill >> Jul 20, 2023  1:28 PM Donee H wrote: Medication: cephALEXin  (KEFLEX ) 500 MG capsule  Has the patient contacted their pharmacy? No (Agent: If no, request that the patient contact the pharmacy for the refill. If patient does not wish to contact the pharmacy document the reason why and proceed with request.) (Agent: If yes, when and what did the pharmacy advise?)  This is the patient's preferred pharmacy:  [id number] - [address] [place] [address] Phone: [phone] Fax: [phone]  Is this the correct pharmacy for this prescription? Yes If no, delete pharmacy and type the correct one.   Has the prescription been filled recently? No  Is the patient out of the medication? Yes, and patient is requesting a refill today if possible   Has the patient been seen for an appointment in the last year OR does the patient have an upcoming appointment? Yes  Can we respond through MyChart? Yes  Agent: Please be advised that Rx refills may take up to 3 business days. We ask that you follow-up with your pharmacy.

## 2023-07-23 ENCOUNTER — Telehealth: Payer: Self-pay

## 2023-07-23 ENCOUNTER — Telehealth: Payer: Self-pay | Admitting: Family Medicine

## 2023-07-23 DIAGNOSIS — R399 Unspecified symptoms and signs involving the genitourinary system: Secondary | ICD-10-CM

## 2023-07-23 MED ORDER — CEPHALEXIN 500 MG PO CAPS
500.0000 mg | ORAL_CAPSULE | Freq: Two times a day (BID) | ORAL | 0 refills | Status: DC
Start: 2023-07-23 — End: 2023-07-30

## 2023-07-23 NOTE — Telephone Encounter (Signed)
**Note De-identified  [place] Obfuscation** Please advise 

## 2023-07-23 NOTE — Telephone Encounter (Signed)
 Antibiotics aren't routinely refilled. This was given 2 months ago. I can send this time. Please confirm what pharmacy? If symptoms persists after this regimen, I'll need to refer her to uro-gynecologist for further evaluation. Please relay message.

## 2023-07-23 NOTE — Telephone Encounter (Signed)
 Spoke with patient and she understood providers message regarding medication. Patient agreed that if symptoms occur again she is will to be referred to a Uro-Gynecologist, Patient states that the pharmacy she would like to [facility] [address], [place], [place] .  Pharmacy is listed in her chart.

## 2023-07-23 NOTE — Addendum Note (Signed)
 Addended by: Manette Section on: 07/23/2023 11:04 AM   Modules accepted: Orders

## 2023-07-23 NOTE — Telephone Encounter (Signed)
 Copied from CRM [id number]. Topic: General - Other >> Jul 23, 2023 11:19 AM Whitney O wrote: Reason for CRM: corey from [employer] is calling cause patient did a sleep study the doctor was not happy with the results so they want to do another sleep study pre certification is not required only thing i can recommend is to send a pre determination on why she needs to do the sleep study again  Provider services telephone number  [phone]

## 2023-07-23 NOTE — Telephone Encounter (Signed)
 [facility]

## 2023-07-23 NOTE — Telephone Encounter (Signed)
 Copied from CRM [id number]. Topic: Clinical - Medication Question >> Jul 23, 2023 10:36 AM Marissa P wrote: Reason for CRM: Patient would like someone to follow up to know why meds was refused please and what's he needs to do  cephALEXin  (KEFLEX ) 500 MG capsule that is the medication had uti and still has it. She still needs the meds she says and seen doctor for that, please advise

## 2023-07-24 ENCOUNTER — Telehealth (HOSPITAL_BASED_OUTPATIENT_CLINIC_OR_DEPARTMENT_OTHER): Payer: Self-pay

## 2023-07-24 NOTE — Telephone Encounter (Signed)
 Pt said to bypass predetermination since it is not needed   No pa req as well   Pt is scheduled for tomorrow  Nfn

## 2023-07-24 NOTE — Telephone Encounter (Signed)
 Copied from CRM [id number]. Topic: General - Other >> Jul 23, 2023 11:19 AM Whitney O wrote: Reason for CRM: corey from [employer] is calling cause patient did a sleep study the doctor was not happy with the results so they want to do another sleep study pre certification is not required only thing i can recommend is to send a pre determination on why she needs to do the sleep study again  Provider services telephone number  [phone] >> Jul 24, 2023  9:44 AM Buelah Carmel J wrote: Pt is reaching out to clinic again to get clarification concerning the sleep study she completed. She reports the doctor was not happy with results and wants a repeat study. She is not sure who to talk to about the situation, requests call back  CB#  [phone]

## 2023-07-24 NOTE — Telephone Encounter (Signed)
 Kindly place order for CPAP supplies

## 2023-07-24 NOTE — Telephone Encounter (Signed)
 There is no pre determination needed  No auth required, so pre determination is not needed to proceed.   NFN  Rescheduling the pt for sleep study

## 2023-07-25 ENCOUNTER — Other Ambulatory Visit: Payer: Self-pay

## 2023-07-25 ENCOUNTER — Emergency Department (HOSPITAL_COMMUNITY)

## 2023-07-25 ENCOUNTER — Inpatient Hospital Stay (HOSPITAL_COMMUNITY)
Admission: EM | Admit: 2023-07-25 | Discharge: 2023-07-30 | DRG: 189 | Disposition: A | Discharge status: Home Health | Source: Skilled Nursing Facility | Attending: Internal Medicine | Admitting: Internal Medicine

## 2023-07-25 ENCOUNTER — Encounter (HOSPITAL_BASED_OUTPATIENT_CLINIC_OR_DEPARTMENT_OTHER): Admitting: Pulmonary Disease

## 2023-07-25 ENCOUNTER — Telehealth: Payer: Self-pay | Admitting: Family Medicine

## 2023-07-25 ENCOUNTER — Ambulatory Visit (HOSPITAL_BASED_OUTPATIENT_CLINIC_OR_DEPARTMENT_OTHER): Admitting: Orthopaedic Surgery

## 2023-07-25 ENCOUNTER — Encounter (HOSPITAL_COMMUNITY): Payer: Self-pay | Admitting: Emergency Medicine

## 2023-07-25 DIAGNOSIS — Z882 Allergy status to sulfonamides status: Secondary | ICD-10-CM | POA: Diagnosis not present

## 2023-07-25 DIAGNOSIS — G4733 Obstructive sleep apnea (adult) (pediatric): Secondary | ICD-10-CM | POA: Diagnosis present

## 2023-07-25 DIAGNOSIS — Z825 Family history of asthma and other chronic lower respiratory diseases: Secondary | ICD-10-CM

## 2023-07-25 DIAGNOSIS — B9789 Other viral agents as the cause of diseases classified elsewhere: Secondary | ICD-10-CM | POA: Diagnosis present

## 2023-07-25 DIAGNOSIS — I5032 Chronic diastolic (congestive) heart failure: Secondary | ICD-10-CM | POA: Diagnosis present

## 2023-07-25 DIAGNOSIS — I2510 Atherosclerotic heart disease of native coronary artery without angina pectoris: Secondary | ICD-10-CM | POA: Diagnosis present

## 2023-07-25 DIAGNOSIS — Z880 Allergy status to penicillin: Secondary | ICD-10-CM

## 2023-07-25 DIAGNOSIS — Z881 Allergy status to other antibiotic agents status: Secondary | ICD-10-CM | POA: Diagnosis not present

## 2023-07-25 DIAGNOSIS — F32A Depression, unspecified: Secondary | ICD-10-CM | POA: Diagnosis present

## 2023-07-25 DIAGNOSIS — J440 Chronic obstructive pulmonary disease with acute lower respiratory infection: Secondary | ICD-10-CM | POA: Diagnosis present

## 2023-07-25 DIAGNOSIS — E1140 Type 2 diabetes mellitus with diabetic neuropathy, unspecified: Secondary | ICD-10-CM | POA: Diagnosis present

## 2023-07-25 DIAGNOSIS — J204 Acute bronchitis due to parainfluenza virus: Secondary | ICD-10-CM | POA: Diagnosis present

## 2023-07-25 DIAGNOSIS — B338 Other specified viral diseases: Secondary | ICD-10-CM | POA: Diagnosis present

## 2023-07-25 DIAGNOSIS — N179 Acute kidney failure, unspecified: Secondary | ICD-10-CM | POA: Diagnosis present

## 2023-07-25 DIAGNOSIS — Z90710 Acquired absence of both cervix and uterus: Secondary | ICD-10-CM

## 2023-07-25 DIAGNOSIS — I152 Hypertension secondary to endocrine disorders: Secondary | ICD-10-CM | POA: Diagnosis present

## 2023-07-25 DIAGNOSIS — Z96653 Presence of artificial knee joint, bilateral: Secondary | ICD-10-CM | POA: Diagnosis present

## 2023-07-25 DIAGNOSIS — Z85820 Personal history of malignant melanoma of skin: Secondary | ICD-10-CM | POA: Diagnosis not present

## 2023-07-25 DIAGNOSIS — Z7951 Long term (current) use of inhaled steroids: Secondary | ICD-10-CM

## 2023-07-25 DIAGNOSIS — J189 Pneumonia, unspecified organism: Secondary | ICD-10-CM

## 2023-07-25 DIAGNOSIS — B348 Other viral infections of unspecified site: Secondary | ICD-10-CM | POA: Diagnosis not present

## 2023-07-25 DIAGNOSIS — I5030 Unspecified diastolic (congestive) heart failure: Secondary | ICD-10-CM | POA: Diagnosis not present

## 2023-07-25 DIAGNOSIS — Z888 Allergy status to other drugs, medicaments and biological substances status: Secondary | ICD-10-CM

## 2023-07-25 DIAGNOSIS — Z8261 Family history of arthritis: Secondary | ICD-10-CM

## 2023-07-25 DIAGNOSIS — Z803 Family history of malignant neoplasm of breast: Secondary | ICD-10-CM

## 2023-07-25 DIAGNOSIS — Z87891 Personal history of nicotine dependence: Secondary | ICD-10-CM

## 2023-07-25 DIAGNOSIS — J441 Chronic obstructive pulmonary disease with (acute) exacerbation: Principal | ICD-10-CM | POA: Diagnosis present

## 2023-07-25 DIAGNOSIS — Z79899 Other long term (current) drug therapy: Secondary | ICD-10-CM

## 2023-07-25 DIAGNOSIS — Z6841 Body Mass Index (BMI) 40.0 and over, adult: Secondary | ICD-10-CM | POA: Diagnosis not present

## 2023-07-25 DIAGNOSIS — Z8249 Family history of ischemic heart disease and other diseases of the circulatory system: Secondary | ICD-10-CM

## 2023-07-25 DIAGNOSIS — E1169 Type 2 diabetes mellitus with other specified complication: Secondary | ICD-10-CM | POA: Diagnosis present

## 2023-07-25 DIAGNOSIS — J9601 Acute respiratory failure with hypoxia: Secondary | ICD-10-CM | POA: Diagnosis present

## 2023-07-25 DIAGNOSIS — M810 Age-related osteoporosis without current pathological fracture: Secondary | ICD-10-CM | POA: Diagnosis present

## 2023-07-25 DIAGNOSIS — E1165 Type 2 diabetes mellitus with hyperglycemia: Secondary | ICD-10-CM | POA: Diagnosis present

## 2023-07-25 DIAGNOSIS — F419 Anxiety disorder, unspecified: Secondary | ICD-10-CM | POA: Diagnosis present

## 2023-07-25 DIAGNOSIS — Z810 Family history of intellectual disabilities: Secondary | ICD-10-CM

## 2023-07-25 DIAGNOSIS — E785 Hyperlipidemia, unspecified: Secondary | ICD-10-CM | POA: Diagnosis present

## 2023-07-25 DIAGNOSIS — I5189 Other ill-defined heart diseases: Secondary | ICD-10-CM | POA: Diagnosis present

## 2023-07-25 DIAGNOSIS — K219 Gastro-esophageal reflux disease without esophagitis: Secondary | ICD-10-CM | POA: Diagnosis present

## 2023-07-25 DIAGNOSIS — Z886 Allergy status to analgesic agent status: Secondary | ICD-10-CM

## 2023-07-25 DIAGNOSIS — E1159 Type 2 diabetes mellitus with other circulatory complications: Secondary | ICD-10-CM | POA: Diagnosis present

## 2023-07-25 DIAGNOSIS — J449 Chronic obstructive pulmonary disease, unspecified: Secondary | ICD-10-CM | POA: Diagnosis present

## 2023-07-25 LAB — RESPIRATORY PANEL BY PCR

## 2023-07-25 LAB — CBC
HCT: 45.9 % (ref 36.0–46.0)
Hemoglobin: 13.7 g/dL (ref 12.0–15.0)
MCH: 27.5 pg (ref 26.0–34.0)
MCHC: 29.8 g/dL — ABNORMAL LOW (ref 30.0–36.0)
MCV: 92 fL (ref 80.0–100.0)
Platelets: 258 10*3/uL (ref 150–400)
RBC: 4.99 MIL/uL (ref 3.87–5.11)
RDW: 15.2 % (ref 11.5–15.5)
WBC: 7.6 10*3/uL (ref 4.0–10.5)
nRBC: 0 % (ref 0.0–0.2)

## 2023-07-25 LAB — BASIC METABOLIC PANEL WITH GFR
Anion gap: 9 (ref 5–15)
BUN: 17 mg/dL (ref 8–23)
CO2: 22 mmol/L (ref 22–32)
Calcium: 9.3 mg/dL (ref 8.9–10.3)
Chloride: 109 mmol/L (ref 98–111)
Creatinine, Ser: 0.91 mg/dL (ref 0.44–1.00)
GFR, Estimated: >60 mL/min (ref >60)
Glucose, Bld: 122 mg/dL — ABNORMAL HIGH (ref 70–99)
Potassium: 4.1 mmol/L (ref 3.5–5.1)
Sodium: 140 mmol/L (ref 135–145)

## 2023-07-25 LAB — BRAIN NATRIURETIC PEPTIDE: B Natriuretic Peptide: 39.3 pg/mL (ref 0.0–100.0)

## 2023-07-25 MED ORDER — PAROXETINE HCL 20 MG PO TABS
40.0000 mg | ORAL_TABLET | Freq: Every day | ORAL | Status: DC
  Administered 2023-07-25 – 2023-07-30 (×6): 40 mg via ORAL
  Filled 2023-07-25 (×6): qty 2

## 2023-07-25 MED ORDER — MONTELUKAST SODIUM 10 MG PO TABS
10.0000 mg | ORAL_TABLET | Freq: Every day | ORAL | Status: DC
  Administered 2023-07-25 – 2023-07-29 (×5): 10 mg via ORAL
  Filled 2023-07-25 (×5): qty 1

## 2023-07-25 MED ORDER — BUDESON-GLYCOPYRROL-FORMOTEROL 160-9-4.8 MCG/ACT IN AERO
2.0000 | INHALATION_SPRAY | Freq: Two times a day (BID) | RESPIRATORY_TRACT | Status: DC
  Administered 2023-07-25 – 2023-07-30 (×10): 2 via RESPIRATORY_TRACT
  Filled 2023-07-25: qty 5.9

## 2023-07-25 MED ORDER — ATORVASTATIN CALCIUM 10 MG PO TABS
10.0000 mg | ORAL_TABLET | Freq: Every day | ORAL | Status: DC
  Administered 2023-07-25 – 2023-07-30 (×6): 10 mg via ORAL
  Filled 2023-07-25 (×6): qty 1

## 2023-07-25 MED ORDER — ALPRAZOLAM 0.25 MG PO TABS: 0.2500 mg | ORAL_TABLET | Freq: Two times a day (BID) | ORAL | Status: DC | PRN

## 2023-07-25 MED ORDER — CHOLESTYRAMINE 4 G PO PACK
4.0000 g | PACK | Freq: Two times a day (BID) | ORAL | Status: DC
  Administered 2023-07-25 – 2023-07-28 (×4): 4 g via ORAL
  Filled 2023-07-25 (×8): qty 1

## 2023-07-25 MED ORDER — FAMOTIDINE 20 MG PO TABS
20.0000 mg | ORAL_TABLET | Freq: Every day | ORAL | Status: DC | PRN
  Administered 2023-07-28: 20 mg via ORAL
  Filled 2023-07-25: qty 1

## 2023-07-25 MED ORDER — SODIUM CHLORIDE 0.9% FLUSH
10.0000 mL | INTRAVENOUS | Status: DC | PRN
  Administered 2023-07-26: 10 mL

## 2023-07-25 MED ORDER — IRBESARTAN 300 MG PO TABS: 300.0000 mg | ORAL_TABLET | Freq: Every day | ORAL | Status: DC

## 2023-07-25 MED ORDER — AMLODIPINE-VALSARTAN-HCTZ 10-320-25 MG PO TABS: 1.0000 | ORAL_TABLET | Freq: Every day | ORAL | Status: DC

## 2023-07-25 MED ORDER — ENOXAPARIN SODIUM 40 MG/0.4ML IJ SOSY
40.0000 mg | PREFILLED_SYRINGE | INTRAMUSCULAR | Status: DC
  Administered 2023-07-25 – 2023-07-29 (×5): 40 mg via SUBCUTANEOUS
  Filled 2023-07-25 (×5): qty 0.4

## 2023-07-25 MED ORDER — SODIUM CHLORIDE 0.9 % IV SOLN
1.0000 g | INTRAVENOUS | Status: DC
  Administered 2023-07-26: 1 g via INTRAVENOUS
  Filled 2023-07-25: qty 10

## 2023-07-25 MED ORDER — CARVEDILOL 6.25 MG PO TABS
6.2500 mg | ORAL_TABLET | Freq: Two times a day (BID) | ORAL | Status: DC
  Administered 2023-07-25 – 2023-07-26 (×2): 6.25 mg via ORAL
  Filled 2023-07-25 (×2): qty 1

## 2023-07-25 MED ORDER — SODIUM CHLORIDE 0.9% FLUSH
10.0000 mL | Freq: Two times a day (BID) | INTRAVENOUS | Status: DC
  Administered 2023-07-25 – 2023-07-30 (×10): 10 mL

## 2023-07-25 MED ORDER — SODIUM CHLORIDE 0.9 % IV SOLN
1.0000 g | Freq: Once | INTRAVENOUS | Status: AC
  Administered 2023-07-25: 1 g via INTRAVENOUS
  Filled 2023-07-25: qty 10

## 2023-07-25 MED ORDER — HYDROCHLOROTHIAZIDE 25 MG PO TABS
25.0000 mg | ORAL_TABLET | Freq: Every day | ORAL | Status: DC
  Administered 2023-07-25 – 2023-07-26 (×2): 25 mg via ORAL
  Filled 2023-07-25 (×2): qty 1

## 2023-07-25 MED ORDER — AZITHROMYCIN 500 MG PO TABS
500.0000 mg | ORAL_TABLET | Freq: Every day | ORAL | Status: DC
  Filled 2023-07-25: qty 1

## 2023-07-25 MED ORDER — METHYLPREDNISOLONE SODIUM SUCC 125 MG IJ SOLR
125.0000 mg | INTRAMUSCULAR | Status: DC
  Administered 2023-07-25: 125 mg via INTRAVENOUS
  Filled 2023-07-25: qty 2

## 2023-07-25 MED ORDER — AMLODIPINE BESYLATE 10 MG PO TABS
10.0000 mg | ORAL_TABLET | Freq: Every day | ORAL | Status: DC
  Administered 2023-07-25 – 2023-07-26 (×2): 10 mg via ORAL
  Filled 2023-07-25 (×2): qty 1

## 2023-07-25 MED ORDER — DOXYCYCLINE HYCLATE 100 MG PO TABS
100.0000 mg | ORAL_TABLET | Freq: Once | ORAL | Status: AC
  Administered 2023-07-25: 100 mg via ORAL
  Filled 2023-07-25: qty 1

## 2023-07-25 NOTE — Telephone Encounter (Signed)
 Copied from CRM [id number]. Topic: General - Other >> Jul 25, 2023  9:23 AM Jenice Mitts wrote: Reason for CRM: Thersia Flax from [facility] is calling to let us  know the patient was sent to the ER for shortness of breath

## 2023-07-25 NOTE — Consult Note (Signed)
 WOC Nurse Consult Note: Reason for Consult: MASD under B breasts  Wound type: Intertriginous dermatitis underneath B breasts  ICD-10 CM Codes for Irritant Dermatitis   L30.4  - Erythema intertrigo. Also used for abrasion of the hand, chafing of the skin, dermatitis due to sweating and friction, friction dermatitis, friction eczema, and genital/thigh intertrigo.  Pressure Injury POA: NA  Measurement: widespread under breasts  Wound bed: erythema with scattered partial thickness skin loss r/t moisture and friction  Drainage (amount, consistency, odor) see nursing flowsheet  Periwound: Dressing procedure/placement/frequency: Cleanse underneath bilateral breasts with soap and water, dry and apply Interdry AG as follows: Order Timm Foot # [id number] Measure and cut length of InterDry to fit in skin folds that have skin breakdown Tuck InterDry fabric into skin folds in a single layer, allow for 2 inches of overhang from skin edges to allow for wicking to occur May remove to bathe; dry area thoroughly and then tuck into affected areas again Do not apply any creams or ointments when using InterDry DO NOT THROW AWAY FOR 5 DAYS unless soiled with stool DO NOT [facility] product, this will inactivate the silver in the material  New sheet of Interdry should be applied after 5 days of use if patient continues to have skin breakdown    POC discussed with bedside nurse. Appreciate A. Parke Boll, RN assistance with this consult.   WOC team will not follow. Re-consult if further needs arise.   Thank you,    Ronni Colace MSN, RN-BC, [employer]

## 2023-07-25 NOTE — ED Notes (Signed)
 Sats down to 88-89 % on 2 liters O2 while sleeping Md notified

## 2023-07-25 NOTE — ED Notes (Signed)
 Pt sats down to 87 % on room air while getting walking short distances with assistance, ,

## 2023-07-25 NOTE — ED Provider Notes (Signed)
 [place] EMERGENCY DEPARTMENT AT [facility] Provider Note   CSN: [id number] Arrival date & time: 07/25/23  4098     Patient presents with: Shortness of Breath   [name] [name] is a 83 y.o. female.   HPI Patient presents with dyspnea.  Patient with known COPD, in the process of sleep studies, evaluation for supplemental oxygen now presents with worsening dyspnea over the past day, no chest pain, no fever.  She is here with her granddaughter who assists with the history.  Patient found to be hypoxic after complaining of dyspnea, calling 911.  Room air saturation 80%.    Prior to Admission medications   Medication Sig Start Date End Date Taking? Authorizing Provider  albuterol  (VENTOLIN  HFA) 108 (90 Base) MCG/ACT inhaler INHALE 1-2 PUFFS INTO THE LUNGS EVERY 4 HOURS AS NEEDED FOR WHEEZING OR SHORTNESS OF BREATH. 01/29/23   Rucker, Izella Marshal, MD  ALPRAZolam  (XANAX ) 0.25 MG tablet TAKE (1) TABLET BY MOUTH TWICE DAILY AT 11AM & 5PM FOR ANXIETY. Patient taking differently: Take 0.25 mg by mouth 2 (two) times daily. 1100 and 1500 And an extra 0.25mg  oral twice daily as needed for anxiety 01/25/21   Early, Sara E, NP  AMBULATORY NON FORMULARY MEDICATION One four wheel rolling walker with seat and hand brakes. Brand/style covered by insurance. Medically necessary for ICD-10: R26.81, Z74.1 12/15/20   Early, Sara E, NP  amLODIPine -Valsartan -HCTZ 10-320-25 MG TABS Take 1 tablet by mouth daily. 06/27/23   Mickiel Albany, FNP  ARTHRITIS PAIN RELIEVING 0.075 % topical cream Apply 1 application topically 3 (three) times daily as needed (neuropathic pain in feet). 07/06/20   [provider]  atorvastatin  (LIPITOR) 10 MG tablet TAKE 1 TABLET BY MOUTH EVERY DAY 01/11/23   Rucker, Izella Marshal, MD  Bacillus Coagulans-Inulin (PROBIOTIC FORMULA) 1-250 BILLION-MG CAPS TAKE 1 CAPSULE (250MG ) BY MOUTH 2 TIMES A DAY. 08/19/21   Early, Adriane Albe, NP  blood glucose meter kit and supplies KIT Meter, test  strips, lancets, and alcohol swabs. Use for testing blood sugar every morning before meals and up to 3 additional times per day as needed. Brand based on patient and insurance coverage. 100 strips with 99 refills, 100 lancets with 99 refills, 200 alcohol swabs with 99 refills. Dx: E11.65 03/24/21   Early, Sara E, NP  camphor-menthol Eyvonne Hollering) lotion Apply 1 application topically as needed for itching. Patient taking differently: Apply 1 application  topically 2 (two) times daily as needed for itching. 12/19/19   Farris Hong, PA-C  carvedilol  (COREG ) 6.25 MG tablet TAKE 1 TABLET BY MOUTH 2 TIMES DAILY WITH A MEAL. 04/09/23   Rucker, Izella Marshal, MD  cephALEXin  (KEFLEX ) 500 MG capsule Take 1 capsule (500 mg total) by mouth 2 (two) times daily. 07/23/23   Manette Section, MD  cetirizine (ZYRTEC) 10 MG tablet TAKE 1 TABLET BY MOUTH EVERY DAY 06/14/23   Manette Section, MD  cholestyramine  (QUESTRAN ) 4 g packet Take 1 packet (4 g total) by mouth 2 (two) times daily. 04/02/23   Graciella Lavender, PA  cloNIDine  (CATAPRES ) 0.1 MG tablet Take 1 tablet (0.1 mg total) by mouth 3 (three) times daily as needed (systolic blood pressurre above 190). 04/30/21   Lind Repine, MD  colchicine -probenecid  0.5-500 MG tablet Take 1 tablet by mouth daily. 03/21/22   Mickiel Albany, FNP  diclofenac  Sodium (VOLTAREN  ARTHRITIS PAIN) 1 % GEL Apply 2 g topically 4 (four) times daily. 12/12/22   Manette Section, MD  EASYMAX TEST test strip  03/24/21   [provider]  famotidine  (PEPCID ) 20 MG tablet Take 1 tablet (20 mg total) by mouth daily as needed for heartburn or indigestion. 08/13/20   Early, Sara E, NP  FARXIGA  10 MG TABS tablet TAKE 1 TABLET BY MOUTH DAILY BEFORE BREAKFAST. 06/22/23   Manette Section, MD  ferrous sulfate  325 (65 FE) MG tablet TAKE 1 TABLET BY MOUTH EVERY DAY 08/28/22   Manette Section, MD  Fluticasone -Umeclidin-Vilant (TRELEGY ELLIPTA ) 100-62.5-25 MCG/ACT AEPB INHALE 1 PUFF BY MOUTH INTO THE  LUNGS DAILY. ** RINSE MOUTH AFTER USE** 04/30/23   Raejean Bullock, NP  gabapentin  (NEURONTIN ) 300 MG capsule Take 300 mg by mouth 3 (three) times daily.    [provider]  hydrALAZINE  (APRESOLINE ) 25 MG tablet TAKE 1 TABLET BY MOUTH TWICE A DAY 06/26/23   Manette Section, MD  Lactase 9000 units TABS Take one tablet with the first bite of meals containing milk or dairy products. Patient taking differently: Take 9,000 Units by mouth See admin instructions. Take 9000units with the first bite of meals containing milk or dairy products. 11/11/20   Early, Sara E, NP  lidocaine  (LIDODERM ) 5 % Place 1 patch onto the skin daily. Remove & Discard patch within 12 hours or as directed by MD 12/12/22   Manette Section, MD  methenamine  (HIPREX ) 1 g tablet Take 1 tablet (1 g total) by mouth 2 (two) times daily with a meal. 02/19/23   Arma Lamp, MD  methylPREDNISolone  (MEDROL  DOSEPAK) 4 MG TBPK tablet Take per packet instructions 03/19/23   Wilhelmenia Harada, MD  mirabegron  ER (MYRBETRIQ ) 25 MG TB24 tablet TAKE 1 TABLET (25 MG TOTAL) BY MOUTH DAILY. 05/21/23   Arma Lamp, MD  nystatin  ointment (MYCOSTATIN ) Apply 1 Application topically 2 (two) times daily. 05/17/23   Manette Section, MD  PARoxetine  (PAXIL ) 40 MG tablet Take 40 mg by mouth daily. 10/08/19   [provider]  psyllium (METAMUCIL) 58.6 % packet Take 1 packet by mouth daily. 02/08/22   Daina Drum, MD  YUVAFEM  10 MCG TABS vaginal tablet PLACE 1 TABLET VAGINALLY TWICE A WEEK. 05/11/23   Zuleta, Kaitlin G, NP    Allergies: Azithromycin, Bee venom, Erythromycin base, Metronidazole, Nitrofurantoin macrocrystal, Penicillins, Sulfa antibiotics, Cortisone, Cymbalta [duloxetine hcl], Ivp dye [iodinated contrast media], Penicillin g, Codeine, Erythromycin, Fosfomycin, Levofloxacin, Nitrofurantoin, and Percocet [oxycodone-acetaminophen ]    Review of Systems  Updated Vital Signs BP 120/62   Pulse 79   Temp 98.1 F (36.7 C)  (Oral)   Resp 20   SpO2 94%   Physical Exam Vitals and nursing note reviewed.  Constitutional:      Appearance: She is well-developed. She is obese. She is ill-appearing.  HENT:     Head: Normocephalic and atraumatic.   Eyes:     Conjunctiva/sclera: Conjunctivae normal.    Cardiovascular:     Rate and Rhythm: Normal rate and regular rhythm.  Pulmonary:     Effort: Pulmonary effort is normal.     Breath sounds: Decreased breath sounds present.  Abdominal:     General: There is no distension.   Skin:    General: Skin is warm and dry.   Neurological:     Mental Status: She is alert and oriented to person, place, and time.     Cranial Nerves: No cranial nerve deficit.   Psychiatric:        Mood and Affect: Mood normal.     (  all labs ordered are listed, but only abnormal results are displayed) Labs Reviewed  BASIC METABOLIC PANEL WITH GFR - Abnormal; Notable for the following components:      Result Value   Glucose, Bld 122 (*)    All other components within normal limits  CBC - Abnormal; Notable for the following components:   MCHC 29.8 (*)    All other components within normal limits  BRAIN NATRIURETIC PEPTIDE    EKG: EKG Interpretation Date/Time:  Wednesday July 25 2023 08:23:10 EDT Ventricular Rate:  83 PR Interval:  188 QRS Duration:  88 QT Interval:  360 QTC Calculation: 423 R Axis:   -86  Text Interpretation: Sinus rhythm with Premature atrial complexes Left axis deviation Low voltage QRS Artifact Confirmed by Dorenda Gandy [phone]) on 07/25/2023 9:37:28 AM  Radiology: DG Chest 2 View Result Date: 07/25/2023 CLINICAL DATA:  Shortness of breath. EXAM: CHEST - 2 VIEW COMPARISON:  03/24/2021 FINDINGS: Low volume film. Left base opacity has been present on multiple prior studies back to 07/13/2020 and compatible with prominent fat pad as seen on CT 03/30/2023. Opacity at the left base is slightly more prominent today which may be technical. Component of left  base collapse/consolidation or small left effusion cannot be excluded. The cardio pericardial silhouette is enlarged. Right lung clear. Mild vascular congestion without edema. Bones are diffusely demineralized. IMPRESSION: 1. Enlargement of the cardiopericardial silhouette with mild vascular congestion. 2. More pronounced retrocardiac left base opacity on today's study in this patient with known prominent pericardial fat pad. This may be technical although new atelectasis or infiltrate or effusion is not excluded. CT imaging could be used to further evaluate as clinically warranted. Electronically Signed   By: Donnal Fusi M.D.   On: 07/25/2023 11:18     Procedures   Medications Ordered in the ED  cefTRIAXone  (ROCEPHIN ) 1 g in sodium chloride  0.9 % 100 mL IVPB (has no administration in time range)  doxycycline  (VIBRA -TABS) tablet 100 mg (has no administration in time range)                                    Medical Decision Making Elderly female with obesity, COPD, now presents with new hypoxia in the context of shortness of breath.  Patient improved with bronchodilators, and with 2 L supplemental nasal cannula oxygen saturation is 97%, this is abnormal. Cardiac 85 sinus normal no chest pain, little evidence for ACS.  X-ray without  Amount and/or Complexity of Data Reviewed Independent Historian:     Details: Grand daughter External Data Reviewed: notes.    Details: Clinic notes Labs: ordered. Decision-making details documented in ED Course. Radiology: ordered and independent interpretation performed. Decision-making details documented in ED Course. ECG/medicine tests: ordered and independent interpretation performed. Decision-making details documented in ED Course.  Risk Prescription drug management. Decision regarding hospitalization. Diagnosis or treatment significantly limited by social determinants of health.   11:42 AM Patient with continued oxygen requirement.  BNP normal,  possible retrocardiac opacification, left-sided pneumonia, patient will start antibiotics, as above with concern for COPD exacerbation, no oxygen requirement, possible pneumonia she will be admitted for further monitoring, management.     Final diagnoses:  COPD exacerbation (HCC)  Community acquired pneumonia of left lower lobe of lung    ED Discharge Orders     None          Dorenda Gandy, MD 07/25/23 1143

## 2023-07-25 NOTE — Telephone Encounter (Signed)
**Note De-identified  [place] Obfuscation** Please advise 

## 2023-07-25 NOTE — ED Triage Notes (Signed)
 Pt here from an assisted living in with c/o sob , pt uses cpap at night but woke feeling sob , pt is in the process of adding O2 at night , room air sats in the 80s , sats up to 97 % 2 liters

## 2023-07-25 NOTE — H&P (Addendum)
 History and Physical    Patient: [name] [name] EAV:[id number] DOB: [birth year] DOA: 07/25/2023 DOS: the patient was seen and examined on 07/25/2023 PCP: Manette Section, MD  Patient coming from: Home  Chief Complaint:  Chief Complaint  Patient presents with   Shortness of Breath   HPI: [name] [name] is a 83 y.o. female with medical history significant of COPD, HTN, HLD, DM2, and OSA p/w SOB 2/2 AECOPD.  Pt states that she was in her USOH until she woke up at 0600 this morning and took off her CPAP. Pt initially felt SOB and used her albuterol  inh to no relief; she waited about an hour prior to acitivating EMS when her sx did NOT improve. Pt reports having a UTI for which she started oral abx (Keflex ) per he PCP.  In the ED, pt tachypneic and required 2L [place] (RA pta). Labs notable for BNP 39, and CXR showed mild pulmonary congestion. Pt admitted to medicine for ongoing care.  Review of Systems: As mentioned in the history of present illness. All other systems reviewed and are negative. 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   Cataract    Closed fracture of upper end of humerus 11/03/2017   COPD (chronic obstructive pulmonary disease) (HCC)    Depression    Diabetes 1.5, managed as type 2 (HCC)    Encounter to establish care 06/03/2020   Essential hypertension 05/19/2019   Female stress incontinence 02/14/2018   GERD (gastroesophageal reflux disease)    History of opioid abuse (HCC) 05/19/2019   Hyperglycemia 05/19/2019   Hyperlipidemia    Hypertension    Menopause present 02/11/2018   Need for shingles vaccine 07/01/2020   Osteoporosis    Sleep apnea    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 Her smoking use included cigarettes. She started smoking about 68 years ago. She has never used smokeless tobacco. She reports that she does not currently use alcohol. She reports that she does not use drugs.  Allergies  Allergen Reactions   Azithromycin Anaphylaxis and Hives   Bee Venom Anaphylaxis   Erythromycin Base Anaphylaxis   Metronidazole Anaphylaxis   Nitrofurantoin Macrocrystal Anaphylaxis   Penicillins Anaphylaxis and Hives    childhood   Sulfa Antibiotics Anaphylaxis and Hives    Childhood   Cortisone     Other Reaction(s): Not available   Cymbalta [Duloxetine Hcl]    Ivp Dye [Iodinated Contrast Media]    Penicillin G Hives   Codeine Rash   Erythromycin Rash and Hives   Fosfomycin Rash   Levofloxacin Rash   Nitrofurantoin Hives   Percocet [Oxycodone-Acetaminophen ] Rash    Family History  Problem Relation Age of Onset   Cancer Mother    Intellectual disability Mother    Cancer Father    Early death Father  Hypertension Father    Breast cancer Sister    Cancer Sister    Arthritis Maternal Grandmother    Asthma Paternal Grandmother    Asthma Paternal Grandfather    Colon cancer Neg Hx    Stomach cancer Neg Hx    Esophageal cancer Neg Hx    Colon polyps Neg Hx     Prior to Admission medications   Medication Sig Start Date End Date Taking? Authorizing Provider  albuterol  (VENTOLIN  HFA) 108 (90 Base) MCG/ACT inhaler INHALE 1-2 PUFFS INTO THE LUNGS EVERY 4 HOURS AS NEEDED FOR WHEEZING OR SHORTNESS OF BREATH. 01/29/23   Rucker, Izella Marshal, MD  ALPRAZolam  (XANAX ) 0.25 MG tablet TAKE (1) TABLET BY MOUTH TWICE DAILY AT 11AM & 5PM FOR ANXIETY. Patient taking  differently: Take 0.25 mg by mouth 2 (two) times daily. 1100 and 1500 And an extra 0.25mg  oral twice daily as needed for anxiety 01/25/21   Early, Sara E, NP  AMBULATORY NON FORMULARY MEDICATION One four wheel rolling walker with seat and hand brakes. Brand/style covered by insurance. Medically necessary for ICD-10: R26.81, Z74.1 12/15/20   Early, Sara E, NP  amLODIPine -Valsartan -HCTZ 10-320-25 MG TABS Take 1 tablet by mouth daily. 06/27/23   Mickiel Albany, FNP  ARTHRITIS PAIN RELIEVING 0.075 % topical cream Apply 1 application topically 3 (three) times daily as needed (neuropathic pain in feet). 07/06/20   [provider]  atorvastatin  (LIPITOR) 10 MG tablet TAKE 1 TABLET BY MOUTH EVERY DAY 01/11/23   Rucker, Izella Marshal, MD  Bacillus Coagulans-Inulin (PROBIOTIC FORMULA) 1-250 BILLION-MG CAPS TAKE 1 CAPSULE (250MG ) BY MOUTH 2 TIMES A DAY. 08/19/21   Early, Adriane Albe, NP  blood glucose meter kit and supplies KIT Meter, test strips, lancets, and alcohol swabs. Use for testing blood sugar every morning before meals and up to 3 additional times per day as needed. Brand based on patient and insurance coverage. 100 strips with 99 refills, 100 lancets with 99 refills, 200 alcohol swabs with 99 refills. Dx: E11.65 03/24/21   Early, Sara E, NP  camphor-menthol Eyvonne Hollering) lotion Apply 1 application topically as needed for itching. Patient taking differently: Apply 1 application  topically 2 (two) times daily as needed for itching. 12/19/19   Farris Hong, PA-C  carvedilol  (COREG ) 6.25 MG tablet TAKE 1 TABLET BY MOUTH 2 TIMES DAILY WITH A MEAL. 04/09/23   Rucker, Izella Marshal, MD  cephALEXin  (KEFLEX ) 500 MG capsule Take 1 capsule (500 mg total) by mouth 2 (two) times daily. 07/23/23   Manette Section, MD  cetirizine (ZYRTEC) 10 MG tablet TAKE 1 TABLET BY MOUTH EVERY DAY 06/14/23   Manette Section, MD  cholestyramine  (QUESTRAN ) 4 g packet Take 1 packet (4 g total) by mouth 2 (two) times daily. 04/02/23   Graciella Lavender, PA  cloNIDine  (CATAPRES ) 0.1 MG tablet Take 1 tablet (0.1 mg total) by mouth 3 (three) times daily as needed (systolic blood pressurre above 190). 04/30/21   Lind Repine, MD  colchicine -probenecid  0.5-500 MG tablet Take 1 tablet by mouth daily. 03/21/22   Mickiel Albany, FNP  diclofenac  Sodium (VOLTAREN  ARTHRITIS PAIN) 1 % GEL Apply 2 g topically 4 (four) times daily. 12/12/22   Manette Section, MD  EASYMAX TEST test strip  03/24/21   [provider]  famotidine  (PEPCID ) 20 MG tablet Take 1 tablet (20 mg total) by mouth daily as needed for heartburn or indigestion. 08/13/20   Early, Sara E, NP  FARXIGA  10 MG  TABS tablet TAKE 1 TABLET BY MOUTH DAILY BEFORE BREAKFAST. 06/22/23   Manette Section, MD  ferrous sulfate  325 (65 FE) MG tablet TAKE 1 TABLET BY MOUTH EVERY DAY 08/28/22   Manette Section, MD  Fluticasone -Umeclidin-Vilant (TRELEGY ELLIPTA ) 100-62.5-25 MCG/ACT AEPB INHALE 1 PUFF BY MOUTH INTO THE LUNGS DAILY. ** RINSE MOUTH AFTER USE** 04/30/23   Raejean Bullock, NP  gabapentin  (NEURONTIN ) 300 MG capsule Take 300 mg by mouth 3 (three) times daily.    [provider]  hydrALAZINE  (APRESOLINE ) 25 MG tablet TAKE 1 TABLET BY MOUTH TWICE A DAY 06/26/23   Manette Section, MD  Lactase 9000 units TABS Take one tablet with the first bite of meals containing milk or dairy products. Patient taking differently: Take 9,000 Units by mouth See admin instructions. Take 9000units with the first bite of meals containing milk or dairy products. 11/11/20   Early, Sara E, NP  lidocaine  (LIDODERM ) 5 % Place 1 patch onto the skin daily. Remove & Discard patch within 12 hours or as directed by MD 12/12/22   Manette Section, MD  methenamine  (HIPREX ) 1 g tablet Take 1 tablet (1 g total) by mouth 2 (two) times daily with a meal. 02/19/23   Arma Lamp, MD  methylPREDNISolone  (MEDROL  DOSEPAK) 4 MG TBPK tablet Take per packet instructions 03/19/23   Wilhelmenia Harada, MD  mirabegron  ER  (MYRBETRIQ ) 25 MG TB24 tablet TAKE 1 TABLET (25 MG TOTAL) BY MOUTH DAILY. 05/21/23   Arma Lamp, MD  nystatin  ointment (MYCOSTATIN ) Apply 1 Application topically 2 (two) times daily. 05/17/23   Manette Section, MD  PARoxetine  (PAXIL ) 40 MG tablet Take 40 mg by mouth daily. 10/08/19   [provider]  psyllium (METAMUCIL) 58.6 % packet Take 1 packet by mouth daily. 02/08/22   Daina Drum, MD  YUVAFEM  10 MCG TABS vaginal tablet PLACE 1 TABLET VAGINALLY TWICE A WEEK. 05/11/23   Zuleta, Kaitlin G, NP    Physical Exam: Vitals:   07/25/23 0816 07/25/23 1015 07/25/23 1030 07/25/23 1045  BP:  (!) 129/38 (!) 121/53 120/62  Pulse:  75 74 79  Resp:  18 (!) 24 20  Temp:      TempSrc:      SpO2: 97% 96% 96% 94%   General: Alert, oriented x3, resting comfortably in no acute distress HEENT: EOMI, oropharynx clear, moist mucous membranes, hearing intact Neck: Trachea midline and no gross thyromegaly Respiratory: Lungs clear to auscultation bilaterally with normal respiratory effort; no w/r/r Cardiovascular: Regular rate and rhythm w/o m/r/g Abdomen: Soft, nontender, nondistended. Positive bowel sounds MSK: No obvious joint deformities or swelling Skin: No obvious rashes or lesions Neurologic: Awake, alert, spontaneously moves all extremities, strength intact Psychiatric: Appropriate mood and affect, conversational and cooperative  Data Reviewed:  Lab Results  Component Value Date   WBC 7.6 07/25/2023   HGB 13.7 07/25/2023   HCT 45.9 07/25/2023   MCV 92.0 07/25/2023   PLT 258 07/25/2023   Lab Results  Component Value Date   GLUCOSE 122 (H) 07/25/2023   CALCIUM  9.3 07/25/2023   NA 140 07/25/2023   K 4.1 07/25/2023   CO2 22 07/25/2023   CL 109 07/25/2023   BUN 17 07/25/2023   CREATININE 0.91 07/25/2023   Lab Results  Component Value Date   ALT 12 05/02/2023   AST 16 05/02/2023   ALKPHOS 105 05/02/2023   BILITOT 0.4 05/02/2023   No results found for: INR,  PROTIME  Radiology:  DG Chest 2 View Result Date: 07/25/2023 CLINICAL DATA:  Shortness of breath. EXAM: CHEST - 2 VIEW COMPARISON:  03/24/2021 FINDINGS: Low volume film. Left base opacity has been present on multiple prior studies back to 07/13/2020 and compatible with prominent fat pad as seen on CT 03/30/2023. Opacity at the left base is slightly more prominent today which may be technical. Component of left base collapse/consolidation or small left effusion cannot be excluded. The cardio pericardial silhouette is enlarged. Right lung clear. Mild vascular congestion without edema. Bones are diffusely demineralized. IMPRESSION: 1. Enlargement of the cardiopericardial silhouette with mild vascular congestion. 2. More pronounced retrocardiac left base opacity on today's study in this patient with known prominent pericardial fat pad. This may be technical although new atelectasis or infiltrate or effusion is not excluded. CT imaging could be used to further evaluate as clinically warranted. Electronically Signed   By: Donnal Fusi M.D.   On: 07/25/2023 11:18    Assessment and Plan: 59F h/o COPD, HTN, HLD, DM2, and OSA p/w SOB 2/2 AECOPD.  AECOPD -RT consult for pulmonary toiletry; apprec eval/recs -IV CTX 1g daily x5 days -Continue IV prednisolone 125mg  daily for now iso AECOPD -Breztri inh BID and duonebs prn; consider IP vs OP Pulm consult to optimize OP meds given frequent admissions -Start montelukast 10mg  at bedtime -Incentive spirometry and flutter valve prn -Wean O2 as tolerated -Ambulatory pulse prior to d/c  Presumed UTI Pt called PCP w/ UTI and prescribed abx (keflex ) w/ NO urine testing -IV CTX per above (pta on Keflex  per PCP) -F/u UA and reflex culture; tailor abx accordingly  HTN -PTA coreg , amlodipine , and hydrochlorothiazide  -HOLD pta valsartan   Anxiety -PTA ativan 0.25mg  BID prn  Physical deconditioning -PT/OT eval pending; apprec eval/recs   Advance Care  Planning:   Code Status: Full Code   Consults: N/A  Family Communication: N/A  Severity of Illness: The appropriate patient status for this patient is INPATIENT. Inpatient status is judged to be reasonable and necessary in order to provide the required intensity of service to ensure the patient's safety. The patient's presenting symptoms, physical exam findings, and initial radiographic and laboratory data in the context of their chronic comorbidities is felt to place them at high risk for further clinical deterioration. Furthermore, it is not anticipated that the patient will be medically stable for discharge from the hospital within 2 midnights of admission.   * I certify that at the point of admission it is my clinical judgment that the patient will require inpatient hospital care spanning beyond 2 midnights from the point of admission due to high intensity of service, high risk for further deterioration and high frequency of surveillance required.*  Author: Arne Langdon, MD 07/25/2023 1:37 PM  For on call review [email or web address].

## 2023-07-25 NOTE — Telephone Encounter (Signed)
Noted and aware.

## 2023-07-25 NOTE — Evaluation (Signed)
 Occupational Therapy Evaluation Patient Details Name: [name] [name] MRN: [id number] DOB: 08/27/1940 Today's Date: 07/25/2023   History of Present Illness   Pt is an 83 y/o F presenting to ED on 6/18 with SOB, admitted for acute COPD exacerbation. PMH includes COPD, HTN, HLD, DM2, OSA w/ SOB     Clinical Impressions Pt reports ind at baseline with ADLs, uses rollator for mobility. Pt lives in ILF, goes to dining hall for meals. Pt currently needing up to mod A for ADLs, min A for bed mobility and CGA for transfers with RW. Pt with SpO2 92-94% on 3L O2 throughout session, 1/4 DOE noted and pt taking x1 standing rest break. Pt educated on importance of continued mobility during hospital admission. Pt presenting with impairments listed below, will follow acutely. Recommend HHOT at d/c pending progression.     If plan is discharge home, recommend the following:   A little help with walking and/or transfers;A little help with bathing/dressing/bathroom;Assistance with cooking/housework;Assist for transportation;Help with stairs or ramp for entrance     Functional Status Assessment   Patient has had a recent decline in their functional status and demonstrates the ability to make significant improvements in function in a reasonable and predictable amount of time.     Equipment Recommendations   None recommended by OT     Recommendations for Other Services   PT consult     Precautions/Restrictions   Precautions Precautions: Fall Precaution/Restrictions Comments: watch O2 Restrictions Weight Bearing Restrictions Per Provider Order: No     Mobility Bed Mobility Overal bed mobility: Needs Assistance Bed Mobility: Supine to Sit     Supine to sit: Min assist     General bed mobility comments: min A to scoot hips forward    Transfers Overall transfer level: Needs assistance Equipment used: Rolling walker (2 wheels) Transfers: Sit to/from Stand Sit to Stand:  Contact guard assist                  Balance Overall balance assessment: Needs assistance Sitting-balance support: Feet supported Sitting balance-Leahy Scale: Good     Standing balance support: Reliant on assistive device for balance, During functional activity Standing balance-Leahy Scale: Fair Standing balance comment: static standing at sink without AD                           ADL either performed or assessed with clinical judgement   ADL Overall ADL's : Needs assistance/impaired Eating/Feeding: Set up;Sitting   Grooming: Wash/dry face;Contact guard assist;Standing   Upper Body Bathing: Minimal assistance;Sitting   Lower Body Bathing: Moderate assistance;Sitting/lateral leans   Upper Body Dressing : Minimal assistance;Sitting   Lower Body Dressing: Moderate assistance;Sitting/lateral leans   Toilet Transfer: Contact guard assist;Ambulation;Rolling walker (2 wheels);Regular Toilet   Toileting- Clothing Manipulation and Hygiene: Supervision/safety       Functional mobility during ADLs: Contact guard assist;Rolling walker (2 wheels)       Vision   Vision Assessment?: No apparent visual deficits     Perception Perception: Not tested       Praxis Praxis: Not tested       Pertinent Vitals/Pain Pain Assessment Pain Assessment: No/denies pain     Extremity/Trunk Assessment Upper Extremity Assessment Upper Extremity Assessment: Generalized weakness;Right hand dominant;LUE deficits/detail LUE Deficits / Details: shoulder AROM limited  <90*, reports upcoming reverse shoulder surgery LUE Coordination: decreased gross motor   Lower Extremity Assessment Lower Extremity Assessment: Defer to PT evaluation  Cervical / Trunk Assessment Cervical / Trunk Assessment: Normal   Communication Communication Communication: No apparent difficulties   Cognition Arousal: Alert Behavior During Therapy: WFL for tasks assessed/performed Cognition: No  apparent impairments                               Following commands: Intact       Cueing  General Comments   Cueing Techniques: Verbal cues  VSS on 3L O2   Exercises     Shoulder Instructions      Home Living Family/patient expects to be discharged to:: Private residence ([facility]) Living Arrangements: Alone Available Help at Discharge: Available PRN/intermittently Type of Home: Apartment Home Access: Level entry     Home Layout: One level     Bathroom Shower/Tub: Runner, broadcasting/film/video: Rollator (4 wheels);Shower seat          Prior Functioning/Environment Prior Level of Function : Independent/Modified Independent             Mobility Comments: ambulatory w/ rollator ADLs Comments: ind, goes to dining hall for meals, facility provides transportation, son assists with meds    OT Problem List: Decreased strength;Decreased range of motion;Decreased activity tolerance;Cardiopulmonary status limiting activity   OT Treatment/Interventions: Self-care/ADL training;Therapeutic exercise;Energy conservation;DME and/or AE instruction;Therapeutic activities;Balance training;Patient/family education      OT Goals(Current goals can be found in the care plan section)   Acute Rehab OT Goals Patient Stated Goal: none stated OT Goal Formulation: With patient Time For Goal Achievement: 08/08/23 Potential to Achieve Goals: Good ADL Goals Pt Will Perform Lower Body Dressing: with supervision;sit to/from stand;sitting/lateral leans Pt Will Transfer to Toilet: with supervision;ambulating;regular height toilet Pt Will Perform Tub/Shower Transfer: Shower transfer;with supervision;ambulating;shower seat Additional ADL Goal #1: pt will verbalize x3 energy conservation strategies in prep for ADLs   OT Frequency:  Min 2X/week    Co-evaluation              AM-PAC OT 6 Clicks Daily Activity     Outcome Measure  Help from another person eating meals?: A Little Help from another person taking care of personal grooming?: A Little Help from another person toileting, which includes using toliet, bedpan, or urinal?: A Little Help from another person bathing (including washing, rinsing, drying)?: A Little Help from another person to put on and taking off regular upper body clothing?: A Little Help from another person to put on and taking off regular lower body clothing?: A Lot 6 Click Score: 17   End of Session Equipment Utilized During Treatment: Rolling walker (2 wheels);Oxygen Nurse Communication: Mobility status  Activity Tolerance: Patient tolerated treatment well Patient left: in chair;with call bell/phone within reach  OT Visit Diagnosis: Unsteadiness on feet (R26.81);Other abnormalities of gait and mobility (R26.89);Muscle weakness (generalized) (M62.81)                Time: 1610-9604 OT Time Calculation (min): 36 min Charges:  OT General Charges $OT Visit: 1 Visit OT Evaluation $OT Eval Low Complexity: 1 Low OT Treatments $Self Care/Home Management : 8-22 mins  Sumire Halbleib [name], OTD, OTR/L SecureChat Preferred Acute Rehab [phone]   Benedict Brain Koonce 07/25/2023, 4:41 PM

## 2023-07-26 ENCOUNTER — Telehealth: Payer: Self-pay

## 2023-07-26 ENCOUNTER — Other Ambulatory Visit: Payer: Self-pay | Admitting: Family Medicine

## 2023-07-26 ENCOUNTER — Inpatient Hospital Stay (HOSPITAL_COMMUNITY)

## 2023-07-26 DIAGNOSIS — G4733 Obstructive sleep apnea (adult) (pediatric): Secondary | ICD-10-CM

## 2023-07-26 DIAGNOSIS — E1169 Type 2 diabetes mellitus with other specified complication: Secondary | ICD-10-CM

## 2023-07-26 DIAGNOSIS — E1159 Type 2 diabetes mellitus with other circulatory complications: Secondary | ICD-10-CM

## 2023-07-26 DIAGNOSIS — J441 Chronic obstructive pulmonary disease with (acute) exacerbation: Secondary | ICD-10-CM | POA: Diagnosis not present

## 2023-07-26 DIAGNOSIS — E785 Hyperlipidemia, unspecified: Secondary | ICD-10-CM

## 2023-07-26 DIAGNOSIS — I152 Hypertension secondary to endocrine disorders: Secondary | ICD-10-CM

## 2023-07-26 DIAGNOSIS — I5030 Unspecified diastolic (congestive) heart failure: Secondary | ICD-10-CM | POA: Diagnosis not present

## 2023-07-26 DIAGNOSIS — B348 Other viral infections of unspecified site: Secondary | ICD-10-CM | POA: Insufficient documentation

## 2023-07-26 DIAGNOSIS — E1165 Type 2 diabetes mellitus with hyperglycemia: Secondary | ICD-10-CM

## 2023-07-26 DIAGNOSIS — R399 Unspecified symptoms and signs involving the genitourinary system: Secondary | ICD-10-CM

## 2023-07-26 DIAGNOSIS — J9601 Acute respiratory failure with hypoxia: Secondary | ICD-10-CM

## 2023-07-26 LAB — BASIC METABOLIC PANEL WITH GFR
Anion gap: 9 (ref 5–15)
BUN: 18 mg/dL (ref 8–23)
CO2: 22 mmol/L (ref 22–32)
Calcium: 8.9 mg/dL (ref 8.9–10.3)
Chloride: 109 mmol/L (ref 98–111)
Creatinine, Ser: 1.08 mg/dL — ABNORMAL HIGH (ref 0.44–1.00)
GFR, Estimated: 51 mL/min — ABNORMAL LOW (ref >60)
Glucose, Bld: 190 mg/dL — ABNORMAL HIGH (ref 70–99)
Potassium: 4.1 mmol/L (ref 3.5–5.1)
Sodium: 140 mmol/L (ref 135–145)

## 2023-07-26 LAB — ECHOCARDIOGRAM COMPLETE
AR max vel: 1.76 cm2
AV Area VTI: 1.88 cm2
AV Area mean vel: 1.74 cm2
AV Mean grad: 16 mmHg
AV Peak grad: 29.5 mmHg
Ao pk vel: 2.72 m/s
Area-P 1/2: 4.21 cm2
S' Lateral: 3.2 cm

## 2023-07-26 MED ORDER — GUAIFENESIN 100 MG/5ML PO LIQD: 5.0000 mL | ORAL | Status: DC | PRN

## 2023-07-26 MED ORDER — FUROSEMIDE 10 MG/ML IJ SOLN
40.0000 mg | Freq: Every day | INTRAMUSCULAR | Status: DC
  Administered 2023-07-26 – 2023-07-27 (×2): 40 mg via INTRAVENOUS
  Filled 2023-07-26 (×2): qty 4

## 2023-07-26 MED ORDER — FERROUS SULFATE 325 (65 FE) MG PO TABS
325.0000 mg | ORAL_TABLET | Freq: Every day | ORAL | Status: DC
  Administered 2023-07-26 – 2023-07-30 (×5): 325 mg via ORAL
  Filled 2023-07-26 (×5): qty 1

## 2023-07-26 MED ORDER — PREDNISONE 20 MG PO TABS
40.0000 mg | ORAL_TABLET | Freq: Every day | ORAL | Status: DC
  Administered 2023-07-26 – 2023-07-30 (×5): 40 mg via ORAL
  Filled 2023-07-26 (×6): qty 2

## 2023-07-26 MED ORDER — CARVEDILOL 3.125 MG PO TABS
3.1250 mg | ORAL_TABLET | Freq: Two times a day (BID) | ORAL | Status: DC
  Administered 2023-07-26 – 2023-07-30 (×8): 3.125 mg via ORAL
  Filled 2023-07-26 (×8): qty 1

## 2023-07-26 MED ORDER — DIPHENHYDRAMINE HCL 25 MG PO CAPS
25.0000 mg | ORAL_CAPSULE | Freq: Four times a day (QID) | ORAL | Status: DC | PRN
  Administered 2023-07-26 – 2023-07-28 (×3): 25 mg via ORAL
  Filled 2023-07-26 (×3): qty 1

## 2023-07-26 MED ORDER — GABAPENTIN 300 MG PO CAPS
300.0000 mg | ORAL_CAPSULE | Freq: Three times a day (TID) | ORAL | Status: DC
  Administered 2023-07-26 – 2023-07-30 (×13): 300 mg via ORAL
  Filled 2023-07-26 (×13): qty 1

## 2023-07-26 NOTE — Telephone Encounter (Signed)
   Pre-operative Risk Assessment    Patient Name: Roneisha Stern  DOB: Jul 03, 1940 MRN: [id number]   Date of last office visit: 04/16/23 - KATLYN WEST Date of next office visit: TBD   Request for Surgical Clearance    Procedure:  LEFT REVERSE SHOULDER ARTHROPLASTY   Date of Surgery:  Clearance TBD                                Surgeon:  DR. Wilhelmenia Harada Surgeon's Group or Practice Name:  [facility] [place] Phone number:  [phone] Fax number:  [phone]   Type of Clearance Requested:   - Medical    Type of Anesthesia:  General    Additional requests/questions:    SignedVira Grieves   07/26/2023, 4:43 PM

## 2023-07-26 NOTE — Progress Notes (Signed)
*  PRELIMINARY RESULTS* Echocardiogram 2D Echocardiogram has been performed.  [name] [name] 07/26/2023, 3:25 PM

## 2023-07-26 NOTE — Plan of Care (Signed)

## 2023-07-26 NOTE — Progress Notes (Signed)
 Progress Note   Patient: [name] [name] DOB: 29-Mar-1940 DOA: 07/25/2023     1 DOS: the patient was seen and examined on 07/26/2023   Brief hospital course: [name] [name] is a 83 y.o. female with medical history significant of COPD, hypertension, hyperlipidemia, type 2 diabetes mellitus, OSA on CPAP presented with worsening shortness of breath not getting relief with her bronchodilators.  She is admitted to the hospitalist service for further management evaluation of COPD exacerbation.  Assessment and Plan: Acute hypoxic respiratory failure Acute exacerbation of COPD: Parainfluenza virus positive Will taper IV steroids to prednisone  40 mg daily. Continue bronchodilators as needed. Mucinex  ordered. She has no white count. This is likely viral illness exacerbation COPD. Stop IV ceftriaxone  as she has rash over extremities. Multiple antibiotic/ drug allergies noted. Continue supplemental oxygen to maintain saturation greater than 92%. Encourage out of bed to chair, incentive spirometry.  Possible CHF exacerbation: Lower extremity edema, vascular congestion on chest x-ray., though BNP lower side. Will get echocardiogram to rule out CHF. Will give IV Lasix  40 mg daily. Monitor daily weights, strict input and output.  Presumed UTI Was given IV Rocephin  in the ED, prescribed Keflex  as outpatient. No UA or urine cultures to review. Stopped IV Rocephin  due to her rash.   She does not have any symptoms of UTI.  OSA: CPAP at night ordered.  Hypertension: Blood pressure lower side. Stop amlodipine , hydrochlorothiazide .  Decreased Coreg  to 3.125 twice daily dose.  Anxiety: Home dose Ativan ordered.  Obesity: Calculate BMI Diet, exercise and weight reduction advised.      Out of bed to chair. Incentive spirometry. Nursing supportive care. Fall, aspiration precautions. Diet:  Diet Orders (From admission, onward)     Start     Ordered   07/25/23 1513  Diet  regular Room service appropriate? Yes; Fluid consistency: Thin  Diet effective now       Question Answer Comment  Room service appropriate? Yes   Fluid consistency: Thin      07/25/23 1512           DVT prophylaxis: enoxaparin  (LOVENOX ) injection 40 mg Start: 07/25/23 1400  Level of care: Med-Surg   Code Status: Full Code  Subjective: Patient is seen and examined today morning. She is sitting in chair. Has sob, productive cough. Feels better than yesterday. Eating fair.   Physical Exam: Vitals:   07/25/23 2136 07/26/23 0359 07/26/23 0734 07/26/23 0850  BP:  (!) 129/57  (!) 83/51  Pulse:  86  86  Resp:  19  17  Temp:  98.2 F (36.8 C)  97.8 F (36.6 C)  TempSrc:    Oral  SpO2: 92% (!) 88% 92% 95%    General - Elderly obese Caucasian female, mild respiratory distress HEENT - PERRLA, EOMI, atraumatic head, non tender sinuses. Lung - Clear, basal rales, no rhonchi, wheezes. Heart - S1, S2 heard, no murmurs, rubs, 2+ pedal edema. Abdomen - Soft, non tender, obese, bowel sounds good Neuro - Alert, awake and oriented x 3, non focal exam. Skin - Warm and dry.  Data Reviewed:      Latest Ref Rng & Units 07/25/2023    9:01 AM 07/05/2021   10:21 AM 03/24/2021    1:23 PM  CBC  WBC 4.0 - 10.5 K/uL 7.6  8.7  11.3   Hemoglobin 12.0 - 15.0 g/dL 81.1  91.4  8.9   Hematocrit 36.0 - 46.0 % 45.9  41.0  30.4   Platelets 150 - 400  K/uL 258  275  348       Latest Ref Rng & Units 07/26/2023    4:03 AM 07/25/2023    9:01 AM 05/02/2023   12:27 PM  BMP  Glucose 70 - 99 mg/dL 409  811  914   BUN 8 - 23 mg/dL 18  17  25    Creatinine 0.44 - 1.00 mg/dL 7.82  9.56  2.13   BUN/Creat Ratio 12 - 28   26   Sodium 135 - 145 mmol/L 140  140  140   Potassium 3.5 - 5.1 mmol/L 4.1  4.1  4.4   Chloride 98 - 111 mmol/L 109  109  103   CO2 22 - 32 mmol/L 22  22  24    Calcium  8.9 - 10.3 mg/dL 8.9  9.3  9.7    DG Chest 2 View Result Date: 07/25/2023 CLINICAL DATA:  Shortness of breath. EXAM:  CHEST - 2 VIEW COMPARISON:  03/24/2021 FINDINGS: Low volume film. Left base opacity has been present on multiple prior studies back to 07/13/2020 and compatible with prominent fat pad as seen on CT 03/30/2023. Opacity at the left base is slightly more prominent today which may be technical. Component of left base collapse/consolidation or small left effusion cannot be excluded. The cardio pericardial silhouette is enlarged. Right lung clear. Mild vascular congestion without edema. Bones are diffusely demineralized. IMPRESSION: 1. Enlargement of the cardiopericardial silhouette with mild vascular congestion. 2. More pronounced retrocardiac left base opacity on today's study in this patient with known prominent pericardial fat pad. This may be technical although new atelectasis or infiltrate or effusion is not excluded. CT imaging could be used to further evaluate as clinically warranted. Electronically Signed   By: Donnal Fusi M.D.   On: 07/25/2023 11:18    Family Communication: Discussed with patient, family, understand and agree. All questions answered.  Disposition: Status is: Inpatient Remains inpatient appropriate because: IV lasix , supplemental oxygen, steroids.  Planned Discharge Destination: Home with Home Health     Time spent: 42 minutes  Author: Aisha Hove, MD 07/26/2023 12:47 PM Secure chat 7am to 7pm For on call review [email or web address].

## 2023-07-26 NOTE — Evaluation (Signed)
 Physical Therapy Evaluation Patient Details Name: [name] [name] MRN: [id number] DOB: Jul 29, 1940 Today's Date: 07/26/2023  History of Present Illness  Pt is an 83 y/o F presenting to ED on 6/18 with SOB, admitted for acute COPD exacerbation. PMH includes COPD, HTN, HLD, DM2, OSA w/ SOB  Clinical Impression  PTA pt living in level entry independent living apartment. Pt utilizes Rollator for mobility and facility provides meals and cleaning. Pt independent in ADLs. Pt is currently limited in safe mobility by increased O2 demand, generalized weakness and decreased endurance. Pt is min A for bed mobility, and supervision for transfers and marching in place. PT recommending HHPT at discharge for improving strength and endurance. Mobility Specialist is working with pt and PT will continue to follow acutely.       If plan is discharge home, recommend the following: Assist for transportation   Can travel by private vehicle    Yes    Equipment Recommendations None recommended by PT     Functional Status Assessment Patient has had a recent decline in their functional status and demonstrates the ability to make significant improvements in function in a reasonable and predictable amount of time.     Precautions / Restrictions Precautions Precautions: Fall Precaution/Restrictions Comments: watch O2 Restrictions Weight Bearing Restrictions Per Provider Order: No      Mobility  Bed Mobility Overal bed mobility: Needs Assistance Bed Mobility: Supine to Sit     Supine to sit: Min assist, HOB elevated, Used rails     General bed mobility comments: min A for trunk to upright and to scoot hips forward    Transfers Overall transfer level: Needs assistance Equipment used: Rolling walker (2 wheels) Transfers: Sit to/from Stand Sit to Stand: Supervision           General transfer comment: good power up and self steadying in RW    Ambulation/Gait               General Gait  Details: deferred due to no O2 carrier on floor     Balance Overall balance assessment: Needs assistance Sitting-balance support: Feet supported Sitting balance-Leahy Scale: Good     Standing balance support: Reliant on assistive device for balance, During functional activity Standing balance-Leahy Scale: Fair Standing balance comment: static standing at sink without AD                             Pertinent Vitals/Pain  No     Home Living Family/patient expects to be discharged to:: Private residence [facility] [place] [facility]) Living Arrangements: Alone Available Help at Discharge: Available PRN/intermittently Type of Home: Apartment Home Access: Level entry       Home Layout: One level Home Equipment: Rollator (4 wheels);Shower seat      Prior Function Prior Level of Function : Independent/Modified Independent             Mobility Comments: ambulatory w/ rollator ADLs Comments: ind, goes to dining hall for meals, facility provides transportation, son assists with meds     Extremity/Trunk Assessment   Upper Extremity Assessment Upper Extremity Assessment: Defer to OT evaluation LUE Deficits / Details: shoulder AROM limited  <90*, reports upcoming reverse shoulder surgery LUE Coordination: decreased gross motor    Lower Extremity Assessment Lower Extremity Assessment: Generalized weakness    Cervical / Trunk Assessment Cervical / Trunk Assessment: Normal  Communication   Communication Communication: No apparent difficulties    Cognition  Arousal: Alert Behavior During Therapy: WFL for tasks assessed/performed                             Following commands: Intact       Cueing Cueing Techniques: Verbal cues     General Comments General comments (skin integrity, edema, etc.): pt on 4L O2 via [place] on entry with SpO2 97%O2, reduced to 3L O2 via [place] pt with drop to 87%O2 with movement to EoB but quickly rebounds to 93%O2 with  purse lip breathing. pt able to perform marching in place 20 sec with SpO2 >92%O2    Exercises Other Exercises Other Exercises: marching in place ~20 sec   Assessment/Plan    PT Assessment Patient needs continued PT services  PT Problem List Decreased strength;Decreased activity tolerance;Decreased mobility;Cardiopulmonary status limiting activity       PT Treatment Interventions DME instruction;Gait training;Functional mobility training;Therapeutic activities;Therapeutic exercise;Balance training;Cognitive remediation;Patient/family education    PT Goals (Current goals can be found in the Care Plan section)  Acute Rehab PT Goals PT Goal Formulation: With patient Time For Goal Achievement: 08/09/23 Potential to Achieve Goals: Fair    Frequency Min 2X/week        AM-PAC PT 6 Clicks Mobility  Outcome Measure Help needed turning from your back to your side while in a flat bed without using bedrails?: None Help needed moving from lying on your back to sitting on the side of a flat bed without using bedrails?: A Little Help needed moving to and from a bed to a chair (including a wheelchair)?: None Help needed standing up from a chair using your arms (e.g., wheelchair or bedside chair)?: None Help needed to walk in hospital room?: A Little Help needed climbing 3-5 steps with a railing? : A Little 6 Click Score: 21    End of Session Equipment Utilized During Treatment: Gait belt;Oxygen Activity Tolerance: Patient tolerated treatment well Patient left: in bed;with call bell/phone within reach;with bed alarm set Nurse Communication: Mobility status PT Visit Diagnosis: Unsteadiness on feet (R26.81);Muscle weakness (generalized) (M62.81);Difficulty in walking, not elsewhere classified (R26.2)    Time: 4098-1191 PT Time Calculation (min) (ACUTE ONLY): 23 min   Charges:   PT Evaluation $PT Eval Low Complexity: 1 Low PT Treatments $Therapeutic Exercise: 8-22 mins PT General  Charges $$ ACUTE PT VISIT: 1 Visit         [name] [name] PT, DPT Acute Rehabilitation Services Please use secure chat or  Call Office [phone]   Verlie Glisson [facility] 07/26/2023, 12:26 PM

## 2023-07-27 DIAGNOSIS — I2510 Atherosclerotic heart disease of native coronary artery without angina pectoris: Secondary | ICD-10-CM

## 2023-07-27 DIAGNOSIS — B348 Other viral infections of unspecified site: Secondary | ICD-10-CM

## 2023-07-27 LAB — URINALYSIS, ROUTINE W REFLEX MICROSCOPIC
Bilirubin Urine: NEGATIVE
Glucose, UA: 50 mg/dL — AB
Hgb urine dipstick: NEGATIVE
Ketones, ur: NEGATIVE mg/dL
Leukocytes,Ua: NEGATIVE
Nitrite: NEGATIVE
Protein, ur: NEGATIVE mg/dL
Specific Gravity, Urine: 1.009 (ref 1.005–1.030)
pH: 5 (ref 5.0–8.0)

## 2023-07-27 LAB — CBC
HCT: 41.4 % (ref 36.0–46.0)
Hemoglobin: 12.4 g/dL (ref 12.0–15.0)
MCH: 27.3 pg (ref 26.0–34.0)
MCHC: 30 g/dL (ref 30.0–36.0)
MCV: 91 fL (ref 80.0–100.0)
Platelets: 277 10*3/uL (ref 150–400)
RBC: 4.55 MIL/uL (ref 3.87–5.11)
RDW: 14.8 % (ref 11.5–15.5)
WBC: 12.9 10*3/uL — ABNORMAL HIGH (ref 4.0–10.5)
nRBC: 0 % (ref 0.0–0.2)

## 2023-07-27 LAB — BASIC METABOLIC PANEL WITH GFR
Anion gap: 7 (ref 5–15)
BUN: 32 mg/dL — ABNORMAL HIGH (ref 8–23)
CO2: 25 mmol/L (ref 22–32)
Calcium: 8.4 mg/dL — ABNORMAL LOW (ref 8.9–10.3)
Chloride: 105 mmol/L (ref 98–111)
Creatinine, Ser: 1.15 mg/dL — ABNORMAL HIGH (ref 0.44–1.00)
GFR, Estimated: 48 mL/min — ABNORMAL LOW (ref >60)
Glucose, Bld: 175 mg/dL — ABNORMAL HIGH (ref 70–99)
Potassium: 4.1 mmol/L (ref 3.5–5.1)
Sodium: 137 mmol/L (ref 135–145)

## 2023-07-27 NOTE — TOC CM/SW Note (Signed)
 Transition of Care [facility]) - Inpatient Brief Assessment   Patient Details  Name: [name] [name] MRN: [id number] Date of Birth: 1940-05-27  Transition of Care [facility]) CM/SW Contact:    Juliane Och, LCSW Phone Number: 07/27/2023, 10:04 AM   Clinical Narrative:  10:04 AM Per chart review, patient resides at home alone. Patient has a PCP and insurance. Patient does not have SNF or HH history. Patient has home CPAP. Physical therapy recently evaluated patient and recommended them to discharge home with [facility]. TOC will continue to follow and be available to assist.  Transition of Care Asessment: Insurance and Status: Insurance coverage has been reviewed Patient has primary care physician: Yes Home environment has been reviewed: Private Residence Prior level of function:: Independent/Modified Audiological scientist Home Services: No current home services Social Drivers of Health Review: SDOH reviewed no interventions necessary Readmission risk has been reviewed: Yes Transition of care needs: transition of care needs identified, TOC will continue to follow

## 2023-07-27 NOTE — Telephone Encounter (Signed)
   Name:  [name] [name]  DOB:  Nov 23, 1940  MRN:  [id number]   Primary Cardiologist: Wendie Hamburg, MD  Chart reviewed as part of pre-operative protocol coverage. Patient was contacted 07/27/2023 in reference to pre-operative risk assessment for pending surgery as outlined below.  Ellicia Alix was last seen on 04/2023  Since that day, Cheyane Ayon has been hospitalized on 07/25/2023 for COPD exacerbation.  Due to new or worsening symptoms, Lanasia Porras will require a follow-up visit for further pre-operative risk assessment.  Pre-op covering staff: - Please schedule appointment and call patient to inform them. If patient already had an upcoming appointment within acceptable timeframe, please add pre-op clearance to the appointment notes so provider is aware. - Please contact requesting surgeon's office via preferred method (i.e, phone, fax) to inform them of need for appointment prior to surgery.  Friddie Jetty, NP 07/27/2023, 8:08 AM

## 2023-07-27 NOTE — Progress Notes (Signed)
   07/27/23 2200  BiPAP/CPAP/SIPAP  $ Non-Invasive Ventilator  Non-Invasive Vent Subsequent  BiPAP/CPAP/SIPAP Pt Type Adult  BiPAP/CPAP/SIPAP Resmed  Mask Type Full face mask  Dentures removed? No  Mask Size Medium  Flow Rate 2 lpm  Patient Home Machine No  Patient Home Mask No  Patient Home Tubing No  Auto Titrate Yes   Placed on Cpap for OHS.  Patient tolerated well.

## 2023-07-27 NOTE — Plan of Care (Signed)

## 2023-07-27 NOTE — Progress Notes (Signed)
 Physical Therapy Treatment Patient Details Name: [name] [name] MRN: [id number] DOB: 09-14-1940 Today's Date: 07/27/2023   History of Present Illness Pt is an 83 y/o F presenting to ED on 6/18 with SOB, admitted for acute COPD exacerbation. PMH includes COPD, HTN, HLD, DM2, OSA w/ SOB    PT Comments  Pt received in supine, agreeable to therapy session and with good participation and effort for transfer and standing exercises. Pt reports 5/10 modified RPE (fatigue) after multiple standing exercises and remains reliant on 2L O2 [place] with desat to 88% on RA when briefly assessed during pre-gait standing tasks. Global edema observed and PTA discussed positioning for edema mgmt and pt reports she uses compression socks at baseline, RN/MD notified in case these can be obtained for her. Pt continues to benefit from PT services to progress toward functional mobility goals.     If plan is discharge home, recommend the following: Assist for transportation   Can travel by private vehicle        Equipment Recommendations  None recommended by PT    Recommendations for Other Services       Precautions / Restrictions Precautions Precautions: Fall Recall of Precautions/Restrictions: Intact Precaution/Restrictions Comments: watch O2, L shoulder pain Restrictions Weight Bearing Restrictions Per Provider Order: No     Mobility  Bed Mobility Overal bed mobility: Needs Assistance Bed Mobility: Rolling, Sidelying to Sit, Sit to Supine Rolling: Contact guard assist Sidelying to sit: Used rails, Min assist   Sit to supine: Used rails, Min assist   General bed mobility comments: Increased time and effort to complete bed mobility, pt reports her L shoulder pain causes some issues and has to modify technique a couple times to achieve upright. Needs BLE assist to return to supine.    Transfers Overall transfer level: Needs assistance Equipment used: Rolling walker (2 wheels) Transfers: Sit to/from  Stand, Bed to chair/wheelchair/BSC Sit to Stand: Min assist           General transfer comment: EOB>RW, pt attempted to stand with Supervision but not able to achieve upright despite use of momentum and bari RW. MinA for lift assist and gait belt support from lowest bed height needed.    Ambulation/Gait Ambulation/Gait assistance: Contact guard assist Gait Distance (Feet): 10 Feet Assistive device: Rolling walker (2 wheels) (bari) Gait Pattern/deviations: Decreased stride length       General Gait Details: distance limited due to short O2 cord length and pt fatigue after working with mobility specialist earlier in the day; focus instead on standing exercises   Stairs             Wheelchair Mobility     Tilt Bed    Modified Rankin (Stroke Patients Only)       Balance Overall balance assessment: Mild deficits observed, not formally tested, Needs assistance Sitting-balance support: Feet supported Sitting balance-Leahy Scale: Good     Standing balance support: Reliant on assistive device for balance, During functional activity Standing balance-Leahy Scale: Poor Standing balance comment: RW                            Communication Communication Communication: No apparent difficulties  Cognition Arousal: Alert Behavior During Therapy: WFL for tasks assessed/performed   PT - Cognitive impairments: No apparent impairments                         Following commands: Intact  Cueing Cueing Techniques: Verbal cues  Exercises Other Exercises Other Exercises: marching in place x15 reps ea with cues for higher intensity Other Exercises: standing heel raises x10 reps ea with single UE support Other Exercises: mini squats with cues for more hip hinge to reduce bil knee discomfort x10 reps Other Exercises: STS x2 reps for BLE strengthening    General Comments General comments (skin integrity, edema, etc.): 2L O2 [place] on during session, SpO2  88% when weaned to RA while standing, needs replacement of 2L to maintain >88% with activity.. Noted LE swelling with indentation left by hem of both socks. Recommended removal of socks when in bed or sitting in recliner along with LE elevation. and LUE elevated due to increased edema, swelling around her watch. RN/MD notified pt reports she wears compression socks at baseline, she would benefit for edema mgmt.      Pertinent Vitals/Pain Pain Assessment Pain Assessment: Faces Faces Pain Scale: Hurts little more Pain Location: L shoulder (chronic) and BLE with support transferring back over EOB. Pain Descriptors / Indicators: Sore, Discomfort, Grimacing, Guarding Pain Intervention(s): Limited activity within patient's tolerance, Monitored during session    Home Living                          Prior Function            PT Goals (current goals can now be found in the care plan section) Acute Rehab PT Goals Patient Stated Goal: To get my breathing better and be able to go home and get my shoulder surgery PT Goal Formulation: With patient Time For Goal Achievement: 08/09/23 Progress towards PT goals: Progressing toward goals    Frequency    Min 2X/week      PT Plan      Co-evaluation              AM-PAC PT 6 Clicks Mobility   Outcome Measure  Help needed turning from your back to your side while in a flat bed without using bedrails?: None Help needed moving from lying on your back to sitting on the side of a flat bed without using bedrails?: A Little Help needed moving to and from a bed to a chair (including a wheelchair)?: A Little Help needed standing up from a chair using your arms (e.g., wheelchair or bedside chair)?: A Little Help needed to walk in hospital room?: A Little Help needed climbing 3-5 steps with a railing? : A Lot 6 Click Score: 18    End of Session Equipment Utilized During Treatment: Gait belt;Oxygen Activity Tolerance: Patient  tolerated treatment well Patient left: in bed;with call bell/phone within reach;with bed alarm set Nurse Communication: Mobility status (BUE/LE edema uses compression socks typically; needs to be pulled up in bed.) PT Visit Diagnosis: Unsteadiness on feet (R26.81);Muscle weakness (generalized) (M62.81);Difficulty in walking, not elsewhere classified (R26.2)     Time: 1710-1733 PT Time Calculation (min) (ACUTE ONLY): 23 min  Charges:    $Therapeutic Exercise: 8-22 mins $Therapeutic Activity: 8-22 mins PT General Charges $$ ACUTE PT VISIT: 1 Visit                     Endy Easterly P., PTA Acute Rehabilitation Services Secure Chat Preferred 9a-5:30pm Office: [phone]    Mariel Shope [facility] 07/27/2023, 6:59 PM

## 2023-07-27 NOTE — Progress Notes (Signed)
 Progress Note   Patient: Jaylan Duggar UYQ:[id number] DOB: 1940-05-30 DOA: 07/25/2023     2 DOS: the patient was seen and examined on 07/27/2023   Brief hospital course: Danaiya Steadman is a 83 y.o. female with medical history significant of COPD, hypertension, hyperlipidemia, type 2 diabetes mellitus, OSA on CPAP presented with worsening shortness of breath not getting relief with her bronchodilators.  She is admitted to the hospitalist service for further management evaluation of COPD exacerbation.  6/20: RVP positive for parainfluenza virus 3.  Still needing some oxygen with no baseline oxygen use.  Slight increase in creatinine so discontinuing further IV diuresis. Echocardiogram with normal EF, grade 1 diastolic dysfunction, mild AS  Assessment and Plan: Acute hypoxic respiratory failure Acute exacerbation of COPD: Parainfluenza virus positive Continue prednisone  40 mg daily. Continue bronchodilators as needed. Mucinex  ordered. Continue with supplemental oxygen-wean as tolerated  Possible CHF exacerbation: Lower extremity edema, vascular congestion on chest x-ray., though BNP lower side. Echocardiogram with normal EF, grade 1 diastolic dysfunction, mild AS. Slight increase in creatinine, improvement of lower extremity edema so IV diuresis was discontinued. We can start her on p.o. torsemide if needed-holding at this time Monitor daily weights, strict input and output.  Presumed UTI Was given IV Rocephin  in the ED, prescribed Keflex  as outpatient. No UA or urine cultures to review. Stopped IV Rocephin  due to her rash.   She does not have any symptoms of UTI. Patient was requesting to check UA-which was ordered  OSA: CPAP at night ordered.  Hypertension: Blood pressure within goal Holding amlodipine , hydrochlorothiazide .  Decreased Coreg  to 3.125 twice daily dose.  Anxiety: Home dose Ativan ordered.  Obesity: Calculate BMI Diet, exercise and weight reduction  advised.   Out of bed to chair. Incentive spirometry. Nursing supportive care. Fall, aspiration precautions. Diet:  Diet Orders (From admission, onward)     Start     Ordered   07/25/23 1513  Diet regular Room service appropriate? Yes; Fluid consistency: Thin  Diet effective now       Question Answer Comment  Room service appropriate? Yes   Fluid consistency: Thin      07/25/23 1512           DVT prophylaxis: enoxaparin  (LOVENOX ) injection 40 mg Start: 07/25/23 1400  Level of care: Med-Surg   Code Status: Full Code  Subjective: Patient continued to have significant cough.  She was also concern about UTI stating that she does experience of pain after finishing urination.  She was requesting to check her UA.  Physical Exam: Vitals:   07/26/23 2310 07/27/23 0354 07/27/23 0719 07/27/23 0815  BP:  (!) 118/47  96/62  Pulse: 76 78  74  Resp: 18 18  19   Temp:  98.5 F (36.9 C)  (!) 97.4 F (36.3 C)  TempSrc:      SpO2: 95% (!) 88% 92% 91%   General.  Morbidly obese lady, in no acute distress. Pulmonary.  Few scattered rhonchi bilaterally, normal respiratory effort. CV.  Regular rate and rhythm, no JVD, rub or murmur. Abdomen.  Soft, nontender, nondistended, BS positive. CNS.  Alert and oriented .  No focal neurologic deficit. Extremities.  Trace LE edema,  pulses intact and symmetrical. Psychiatry.  Judgment and insight appears normal.   Data Reviewed:      Latest Ref Rng & Units 07/27/2023    5:12 AM 07/25/2023    9:01 AM 07/05/2021   10:21 AM  CBC  WBC 4.0 - 10.5 K/uL 12.9  7.6  8.7   Hemoglobin 12.0 - 15.0 g/dL 04.5  40.9  81.1   Hematocrit 36.0 - 46.0 % 41.4  45.9  41.0   Platelets 150 - 400 K/uL 277  258  275       Latest Ref Rng & Units 07/27/2023    5:12 AM 07/26/2023    4:03 AM 07/25/2023    9:01 AM  BMP  Glucose 70 - 99 mg/dL 914  782  956   BUN 8 - 23 mg/dL 32  18  17   Creatinine 0.44 - 1.00 mg/dL 2.13  0.86  5.78   Sodium 135 - 145 mmol/L 137  140   140   Potassium 3.5 - 5.1 mmol/L 4.1  4.1  4.1   Chloride 98 - 111 mmol/L 105  109  109   CO2 22 - 32 mmol/L 25  22  22    Calcium  8.9 - 10.3 mg/dL 8.4  8.9  9.3    ECHOCARDIOGRAM COMPLETE Result Date: 07/26/2023    ECHOCARDIOGRAM REPORT   Patient Name:   TAYLIN MANS Date of Exam: 07/26/2023 Medical Rec #:  [id number]        Height:       64.0 in Accession #:    [id number]       Weight:       250.0 lb Date of Birth:  [birth year]       BSA:          2.151 m Patient Age:    82 years         BP:           83/51 mmHg Patient Gender: F                HR:           85 bpm. Exam Location:  Inpatient Procedure: 2D Echo, Cardiac Doppler and Color Doppler (Both Spectral and Color            Flow Doppler were utilized during procedure). Indications:    CHF  History:        Patient has prior history of Echocardiogram examinations, most                 recent 08/01/2022. Risk Factors:Hypertension.  Sonographer:    Jeralene Mom Referring Phys: [id number] Aisha Hove IMPRESSIONS  1. Left ventricular ejection fraction, by estimation, is 70 to 75%. The left ventricle has hyperdynamic function. The left ventricle has no regional wall motion abnormalities. There is mild concentric left ventricular hypertrophy. Left ventricular diastolic parameters are consistent with Grade I diastolic dysfunction (impaired relaxation).  2. Right ventricular systolic function is normal. The right ventricular size is normal. There is normal pulmonary artery systolic pressure.  3. Left atrial size was mildly dilated.  4. The mitral valve is normal in structure. Trivial mitral valve regurgitation. No evidence of mitral stenosis.  5. The aortic valve is tricuspid. There is mild calcification of the aortic valve. Aortic valve regurgitation is not visualized. Mild aortic valve stenosis. Aortic valve area, by VTI measures 1.88 cm. Aortic valve mean gradient measures 16.0 mmHg. Aortic valve Vmax measures 2.72 m/s.  6. The inferior vena  cava is normal in size with greater than 50% respiratory variability, suggesting right atrial pressure of 3 mmHg. FINDINGS  Left Ventricle: Left ventricular ejection fraction, by estimation, is 70 to 75%. The left ventricle has hyperdynamic function. The left ventricle has no regional wall motion abnormalities. The left ventricular internal  cavity size was normal in size. There is mild concentric left ventricular hypertrophy. Left ventricular diastolic parameters are consistent with Grade I diastolic dysfunction (impaired relaxation). Right Ventricle: The right ventricular size is normal. No increase in right ventricular wall thickness. Right ventricular systolic function is normal. There is normal pulmonary artery systolic pressure. The tricuspid regurgitant velocity is 1.30 m/s, and  with an assumed right atrial pressure of 8 mmHg, the estimated right ventricular systolic pressure is 14.8 mmHg. Left Atrium: Left atrial size was mildly dilated. Right Atrium: Right atrial size was normal in size. Pericardium: There is no evidence of pericardial effusion. Mitral Valve: The mitral valve is normal in structure. Trivial mitral valve regurgitation. No evidence of mitral valve stenosis. Tricuspid Valve: The tricuspid valve is normal in structure. Tricuspid valve regurgitation is trivial. No evidence of tricuspid stenosis. Aortic Valve: The aortic valve is tricuspid. There is mild calcification of the aortic valve. Aortic valve regurgitation is not visualized. Mild aortic stenosis is present. Aortic valve mean gradient measures 16.0 mmHg. Aortic valve peak gradient measures 29.5 mmHg. Aortic valve area, by VTI measures 1.88 cm. Pulmonic Valve: The pulmonic valve was normal in structure. Pulmonic valve regurgitation is trivial. No evidence of pulmonic stenosis. Aorta: The aortic root is normal in size and structure. Venous: The inferior vena cava is normal in size with greater than 50% respiratory variability, suggesting  right atrial pressure of 3 mmHg. IAS/Shunts: No atrial level shunt detected by color flow Doppler.  LEFT VENTRICLE PLAX 2D LVIDd:         5.30 cm   Diastology LVIDs:         3.20 cm   LV e' medial:    8.05 cm/s LV PW:         1.10 cm   LV E/e' medial:  11.6 LV IVS:        1.10 cm   LV e' lateral:   8.16 cm/s LVOT diam:     2.20 cm   LV E/e' lateral: 11.4 LV SV:         111 LV SV Index:   52 LVOT Area:     3.80 cm  RIGHT VENTRICLE RV Basal diam:  3.70 cm RV Mid diam:    3.40 cm RV S prime:     15.10 cm/s TAPSE (M-mode): 2.6 cm LEFT ATRIUM             Index        RIGHT ATRIUM           Index LA diam:        4.50 cm 2.09 cm/m   RA Area:     14.90 cm LA Vol (A2C):   55.1 ml 25.62 ml/m  RA Volume:   29.40 ml  13.67 ml/m LA Vol (A4C):   47.0 ml 21.85 ml/m LA Biplane Vol: 51.5 ml 23.94 ml/m  AORTIC VALVE AV Area (Vmax):    1.76 cm AV Area (Vmean):   1.74 cm AV Area (VTI):     1.88 cm AV Vmax:           271.67 cm/s AV Vmean:          185.000 cm/s AV VTI:            0.592 m AV Peak Grad:      29.5 mmHg AV Mean Grad:      16.0 mmHg LVOT Vmax:         126.00 cm/s LVOT Vmean:  84.900 cm/s LVOT VTI:          0.292 m LVOT/AV VTI ratio: 0.49  AORTA Ao Root diam: 2.80 cm Ao Asc diam:  3.40 cm MITRAL VALVE                TRICUSPID VALVE MV Area (PHT): 4.21 cm     TR Peak grad:   6.8 mmHg MV Decel Time: 180 msec     TR Vmax:        130.00 cm/s MV E velocity: 93.40 cm/s MV A velocity: 120.00 cm/s  SHUNTS MV E/A ratio:  0.78         Systemic VTI:  0.29 m                             Systemic Diam: 2.20 cm Jules Oar MD Electronically signed by Jules Oar MD Signature Date/Time: 07/26/2023/3:33:11 PM    Final     Family Communication: Discussed with son at bedside  Disposition: Status is: Inpatient Remains inpatient appropriate because: Still needing oxygen  Planned Discharge Destination: Home with Home Health  DVT prophylaxis.  Lovenox  Time spent: 45 minutes  This record has been created using  Conservation officer, historic buildings. Errors have been sought and corrected,but may not always be located. Such creation errors do not reflect on the standard of care.   Author: Luna Salinas, MD 07/27/2023 2:07 PM Secure chat 7am to 7pm For on call review [email or web address].

## 2023-07-27 NOTE — Progress Notes (Signed)
 Occupational Therapy Treatment Patient Details Name: [name] [name] MRN: [id number] DOB: Oct 09, 1940 Today's Date: 07/27/2023   History of present illness Pt is an 83 y/o F presenting to ED on 6/18 with SOB, admitted for acute COPD exacerbation. PMH includes COPD, HTN, HLD, DM2, OSA w/ SOB   OT comments  Pt in bed upon therapy arrival with son visiting. Pt agreeable to participate in OT treatment session. Focused session on endurance and energy conservation while completing self care task. Pt able to stand at sink to complete grooming task with SBA. VC provided for safe placement of RW while at sink. No LOB noted or need for physical assist while navigating within room. Pt agreed to sit up in recliner and elevate BLE at end of session. Pt will continue to benefit from [facility] follow up services at discharge.       If plan is discharge home, recommend the following:  A little help with walking and/or transfers;A little help with bathing/dressing/bathroom;Assistance with cooking/housework;Assist for transportation;Help with stairs or ramp for entrance   Equipment Recommendations  None recommended by OT       Precautions / Restrictions Precautions Precautions: Fall Precaution/Restrictions Comments: watch O2 Restrictions Weight Bearing Restrictions Per Provider Order: No       Mobility Bed Mobility Overal bed mobility: Needs Assistance Bed Mobility: Supine to Sit     Supine to sit: Supervision, HOB elevated, Used rails     General bed mobility comments: Increased time and effort to complete bed mobility although no physical assistance required.    Transfers Overall transfer level: Needs assistance Equipment used: Rolling walker (2 wheels) Transfers: Sit to/from Stand, Bed to chair/wheelchair/BSC Sit to Stand: Supervision     Step pivot transfers: Supervision     General transfer comment: VC not required for hand placement during RW management during sit<>stand  transitions.     Balance Overall balance assessment: No apparent balance deficits (not formally assessed)        ADL either performed or assessed with clinical judgement   ADL       Grooming: Wash/dry hands;Wash/dry face;Oral care;Supervision/safety;Standing                  Communication Communication Communication: No apparent difficulties   Cognition Arousal: Alert Behavior During Therapy: WFL for tasks assessed/performed Cognition: No apparent impairments       Following commands: Intact        Cueing   Cueing Techniques: Verbal cues        General Comments 2L O2 [place] on during session. Noted LE swelling with indentation left by hem of both socks. Recommended removal of socks when in bed or sitting in recliner along with LE elevation.    Pertinent Vitals/ Pain       Pain Assessment Pain Assessment: Faces Faces Pain Scale: Hurts a little bit Pain Location: right lower leg Pain Descriptors / Indicators: Sore Pain Intervention(s): Monitored during session, Repositioned         Frequency  Min 2X/week        Progress Toward Goals  OT Goals(current goals can now be found in the care plan section)  Progress towards OT goals: Progressing toward goals            AM-PAC OT 6 Clicks Daily Activity     Outcome Measure   Help from another person eating meals?: None Help from another person taking care of personal grooming?: None Help from another person toileting, which includes using toliet, bedpan, or  urinal?: A Little Help from another person bathing (including washing, rinsing, drying)?: A Little Help from another person to put on and taking off regular upper body clothing?: A Little Help from another person to put on and taking off regular lower body clothing?: A Lot 6 Click Score: 19    End of Session Equipment Utilized During Treatment: Rolling walker (2 wheels);Gait [name]  OT Visit Diagnosis: Unsteadiness on feet (R26.81);Other  abnormalities of gait and mobility (R26.89);Muscle weakness (generalized) (M62.81)   Activity Tolerance Patient tolerated treatment well   Patient Left in bed;with call bell/phone within reach;with family/visitor present           Time: 4034-7425 OT Time Calculation (min): 19 min  Charges: OT General Charges $OT Visit: 1 Visit OT Treatments $Self Care/Home Management : 8-22 mins  [name] [name], OTR/L,CBIS  Supplemental OT - [facility] and [facility] Secure Chat Preferred    [name] [name], [name] [name] 07/27/2023, 12:18 PM

## 2023-07-27 NOTE — Progress Notes (Signed)
 Mobility Specialist: Progress Note   07/27/23 1256  Mobility  Activity Ambulated with assistance in hallway  Level of Assistance Contact guard assist, steadying assist  Assistive Device Front wheel walker  Distance Ambulated (ft) 95 ft  Activity Response Tolerated well  Mobility Referral Yes  Mobility visit 1 Mobility  Mobility Specialist Start Time (ACUTE ONLY) 1015  Mobility Specialist Stop Time (ACUTE ONLY) 1029  Mobility Specialist Time Calculation (min) (ACUTE ONLY) 14 min    Pt received in bed, agreeable to mobility session. CG throughout. SpO2 95-96% on 3LO2. Fatigued by EOS but otherwise no complaints. Left on EOB with all needs met, call bell in reach. Visitor present.   Deloria Fetch Mobility Specialist Please contact via SecureChat or Rehab office at [phone]

## 2023-07-28 DIAGNOSIS — J441 Chronic obstructive pulmonary disease with (acute) exacerbation: Secondary | ICD-10-CM | POA: Diagnosis not present

## 2023-07-28 LAB — CBC
HCT: 42.6 % (ref 36.0–46.0)
Hemoglobin: 12.8 g/dL (ref 12.0–15.0)
MCH: 27.7 pg (ref 26.0–34.0)
MCHC: 30 g/dL (ref 30.0–36.0)
MCV: 92.2 fL (ref 80.0–100.0)
Platelets: 270 10*3/uL (ref 150–400)
RBC: 4.62 MIL/uL (ref 3.87–5.11)
RDW: 14.9 % (ref 11.5–15.5)
WBC: 11.5 10*3/uL — ABNORMAL HIGH (ref 4.0–10.5)
nRBC: 0 % (ref 0.0–0.2)

## 2023-07-28 LAB — BASIC METABOLIC PANEL WITH GFR
Anion gap: 7 (ref 5–15)
BUN: 45 mg/dL — ABNORMAL HIGH (ref 8–23)
CO2: 26 mmol/L (ref 22–32)
Calcium: 8.7 mg/dL — ABNORMAL LOW (ref 8.9–10.3)
Chloride: 106 mmol/L (ref 98–111)
Creatinine, Ser: 1.2 mg/dL — ABNORMAL HIGH (ref 0.44–1.00)
GFR, Estimated: 45 mL/min — ABNORMAL LOW (ref >60)
Glucose, Bld: 140 mg/dL — ABNORMAL HIGH (ref 70–99)
Potassium: 3.8 mmol/L (ref 3.5–5.1)
Sodium: 139 mmol/L (ref 135–145)

## 2023-07-28 MED ORDER — ALBUTEROL SULFATE (2.5 MG/3ML) 0.083% IN NEBU
2.5000 mg | INHALATION_SOLUTION | Freq: Three times a day (TID) | RESPIRATORY_TRACT | Status: DC
  Administered 2023-07-29 – 2023-07-30 (×4): 2.5 mg via RESPIRATORY_TRACT
  Filled 2023-07-28 (×4): qty 3

## 2023-07-28 MED ORDER — DOXYCYCLINE HYCLATE 100 MG PO TABS
100.0000 mg | ORAL_TABLET | Freq: Two times a day (BID) | ORAL | Status: AC
  Administered 2023-07-28 – 2023-07-29 (×3): 100 mg via ORAL
  Filled 2023-07-28 (×3): qty 1

## 2023-07-28 MED ORDER — GUAIFENESIN ER 600 MG PO TB12
1200.0000 mg | ORAL_TABLET | Freq: Two times a day (BID) | ORAL | Status: DC
  Administered 2023-07-28 – 2023-07-30 (×5): 1200 mg via ORAL
  Filled 2023-07-28 (×6): qty 2

## 2023-07-28 MED ORDER — ALBUTEROL SULFATE (2.5 MG/3ML) 0.083% IN NEBU: 2.5000 mg | INHALATION_SOLUTION | RESPIRATORY_TRACT | Status: DC | PRN

## 2023-07-28 MED ORDER — ALBUTEROL SULFATE (2.5 MG/3ML) 0.083% IN NEBU
2.5000 mg | INHALATION_SOLUTION | Freq: Four times a day (QID) | RESPIRATORY_TRACT | Status: DC
  Administered 2023-07-28 (×2): 2.5 mg via RESPIRATORY_TRACT
  Filled 2023-07-28 (×2): qty 3

## 2023-07-28 NOTE — Progress Notes (Signed)
Pt placed on overnight pulse ox study.

## 2023-07-28 NOTE — Plan of Care (Signed)

## 2023-07-28 NOTE — Progress Notes (Signed)
 Physical Therapy Treatment Patient Details Name: [name] [name] MRN: [id number] DOB: [birth year] Today's Date: 07/28/2023   History of Present Illness Pt is an 83 y/o F presenting to ED on 6/18 with SOB, admitted for acute COPD exacerbation. PMH includes COPD, HTN, HLD, DM2, OSA w/ SOB    PT Comments  Pt received sitting in the recliner and agreeable to session. Pt reports being soiled at the beginning of the session and requires assist with hygiene. Pt able to transfer to the [facility] to attempt to void more, but is unable. Pt requires assist with pericare in standing. Pt's SpO2 noted to be as low as 83% on RA sitting in the recliner and remains >88% during ambulation on 1L. Education on O2 sat monitoring, pursed lip breathing, and energy conservation. Pt able to tolerate increased gait distance, however continues to be limited by quick fatigue and DOE. Pt continues to benefit from PT services to progress toward functional mobility goals.    If plan is discharge home, recommend the following: Assist for transportation   Can travel by private vehicle        Equipment Recommendations  None recommended by PT    Recommendations for Other Services       Precautions / Restrictions Precautions Precautions: Fall Recall of Precautions/Restrictions: Intact Precaution/Restrictions Comments: watch O2, L shoulder pain Restrictions Weight Bearing Restrictions Per Provider Order: No     Mobility  Bed Mobility Overal bed mobility: Needs Assistance Bed Mobility: Sit to Supine       Sit to supine: Used rails, Min assist   General bed mobility comments: min A for BLE elevation back to EOB    Transfers Overall transfer level: Needs assistance Equipment used: Rolling walker (2 wheels) Transfers: Sit to/from Stand, Bed to chair/wheelchair/BSC Sit to Stand: Contact guard assist   Step pivot transfers: Supervision       General transfer comment: from recliner to/from [facility] with cues for hand  placement    Ambulation/Gait Ambulation/Gait assistance: Contact guard assist Gait Distance (Feet): 30 Feet (+10) Assistive device: Rolling walker (2 wheels) Gait Pattern/deviations: Decreased stride length, Trunk flexed, Step-through pattern       General Gait Details: Cues for RW proximity and upright posture. one seated rest break due to fatigue   Stairs             Wheelchair Mobility     Tilt Bed    Modified Rankin (Stroke Patients Only)       Balance Overall balance assessment: Needs assistance Sitting-balance support: Feet supported Sitting balance-Leahy Scale: Good     Standing balance support: Reliant on assistive device for balance, During functional activity, Bilateral upper extremity supported Standing balance-Leahy Scale: Poor Standing balance comment: RW support                            Communication Communication Communication: No apparent difficulties  Cognition Arousal: Alert Behavior During Therapy: WFL for tasks assessed/performed   PT - Cognitive impairments: No apparent impairments                         Following commands: Intact      Cueing Cueing Techniques: Verbal cues  Exercises      General Comments General comments (skin integrity, edema, etc.): Pt on RA upon entry with SpO2 as low as 83% sitting in the recliner. SpO2 91% on 1L with cues for pursed lip breathing. SpO2 88-90%  on 1L during ambulation with improvement >90% with rest break and pursed lip breathing. Pt left on 1L with SpO2 92%      Pertinent Vitals/Pain Pain Assessment Pain Assessment: Faces Faces Pain Scale: Hurts a little bit Pain Location: L shoulder Pain Descriptors / Indicators: Sore, Discomfort, Grimacing, Guarding Pain Intervention(s): Limited activity within patient's tolerance, Monitored during session     PT Goals (current goals can now be found in the care plan section) Acute Rehab PT Goals Patient Stated Goal: To get  my breathing better and be able to go home and get my shoulder surgery PT Goal Formulation: With patient Time For Goal Achievement: 08/09/23 Progress towards PT goals: Progressing toward goals    Frequency    Min 2X/week       AM-PAC PT 6 Clicks Mobility   Outcome Measure  Help needed turning from your back to your side while in a flat bed without using bedrails?: None Help needed moving from lying on your back to sitting on the side of a flat bed without using bedrails?: A Little Help needed moving to and from a bed to a chair (including a wheelchair)?: A Little Help needed standing up from a chair using your arms (e.g., wheelchair or bedside chair)?: A Little Help needed to walk in hospital room?: A Little Help needed climbing 3-5 steps with a railing? : A Lot 6 Click Score: 18    End of Session Equipment Utilized During Treatment: Gait belt;Oxygen Activity Tolerance: Patient tolerated treatment well Patient left: in bed;with call bell/phone within reach Nurse Communication: Mobility status PT Visit Diagnosis: Unsteadiness on feet (R26.81);Muscle weakness (generalized) (M62.81);Difficulty in walking, not elsewhere classified (R26.2)     Time: 8654-8574 PT Time Calculation (min) (ACUTE ONLY): 40 min  Charges:    $Gait Training: 23-37 mins $Therapeutic Activity: 8-22 mins PT General Charges $$ ACUTE PT VISIT: 1 Visit                    Darryle George, PTA Acute Rehabilitation Services Secure Chat Preferred  [phone]    Darryle George 07/28/2023, 3:05 PM

## 2023-07-28 NOTE — Progress Notes (Addendum)
 PROGRESS NOTE    [name] [name]  FMW:[id number] DOB: 03-07-1940 DOA: 07/25/2023 PCP: Colette Torrence GRADE, MD   Brief Narrative: 83 year old with past medical history significant for COPD, hypertension, hyperlipidemia, diabetes type 2, OSA on CPAP presenting with worsening shortness of breath, not getting relief with her bronchodilators.  The patient was admitted for COPD exacerbation, found to be positive for parainfluenza virus 3.     Assessment & Plan:   Principal Problem:   COPD with acute exacerbation (HCC) Active Problems:   Coronary artery disease involving native heart without angina pectoris   Hyperlipidemia associated with type 2 diabetes mellitus (HCC)   Obstructive sleep apnea syndrome   Hypertension associated with diabetes (HCC)   Acute hypoxic respiratory failure (HCC)   Type 2 diabetes mellitus with hyperglycemia, without long-term current use of insulin  (HCC)   Parainfluenza infection  1-Acute hypoxic respiratory failure - Acute COPD exacerbation In the setting of parainfluenza virus positive -Continue with prednisone  - Continue with Brestri.  - Still wheezing, added scheduled albuterol  - Will check continuous pulse oximetry overnight Check oxygen and ambulation -Will schedule guaifenesin  twice daily Continue montelukast  Added Doxy  2-possible acute diastolic heart failure exacerbation ruled out - Echo ejection fraction 70%, grade 1 diastolic dysfunction mild AAS. - Received IV Lasix , will continue to hold today due to mildly increased creatinine - BNP was normal  Mild AKI;  In setting diuretics. Cr baseline 0.9 Peak to 1.2 Hold lasix .   Presumed UTI: Received Rocephin  in the ED prescribed Keflex  as an outpatient. - Ceftriaxone  was discontinued because patient developed a rash -Recheck UA negative  OSA: Continue with CPAP at at bedtime  Hypertension continue to hold amlodipine  hydrochlorothiazide . Coreg  was decreased to 3.125  Anxiety: Continue  with Ativan  Obesity, morbid obesity: Need lifestyle modification  Estimated body mass index is 42.91 kg/m as calculated from the following:   Height as of 06/28/23: 5' 4 (1.626 m).   Weight as of 06/28/23: 113.4 kg.   DVT prophylaxis: Lovenox  Code Status: Full code Family Communication: Son and daughter in law who were at bedside Disposition Plan:  Status is: Inpatient Remains inpatient appropriate because: Still wheezing, scheduled nebulizer, plan to observe overnight    Consultants:  none  Procedures:  ECHO  Antimicrobials:    Subjective: She is alert, still having cough, on room air, but has not ambulated yet.  Still wheezing.   Objective: Vitals:   07/27/23 0815 07/27/23 1811 07/27/23 1959 07/28/23 0403  BP: 96/62 (!) 118/59 (!) 132/53 (!) 121/51  Pulse: 74 86 84 73  Resp: 19 18 16 16   Temp: (!) 97.4 F (36.3 C) 98.1 F (36.7 C) 98.6 F (37 C) 98.1 F (36.7 C)  TempSrc:   Oral Oral  SpO2: 91% 92% 94% (!) 87%   No intake or output data in the 24 hours ending 07/28/23 0737 There were no vitals filed for this visit.  Examination:  General exam: Appears calm and comfortable  Respiratory system: BL wheezing. SABRA Respiratory effort normal. Cardiovascular system: S1 & S2 heard, RRR.  Gastrointestinal system: Abdomen is nondistended, soft and nontender. No organomegaly or masses felt. Normal bowel sounds heard. Central nervous system: Alert and oriented. No focal neurological deficits. Extremities: Symmetric 5 x 5 power. Skin: No rashes, lesions or ulcers Psychiatry: Judgement and insight appear normal. Mood & affect appropriate.     Data Reviewed: I have personally reviewed following labs and imaging studies  CBC: Recent Labs  Lab 07/25/23 0901 07/27/23 9487  WBC 7.6 12.9*  HGB 13.7 12.4  HCT 45.9 41.4  MCV 92.0 91.0  PLT 258 277   Basic Metabolic Panel: Recent Labs  Lab 07/25/23 0901 07/26/23 0403 07/27/23 0512  NA 140 140 137  K 4.1 4.1  4.1  CL 109 109 105  CO2 22 22 25   GLUCOSE 122* 190* 175*  BUN 17 18 32*  CREATININE 0.91 1.08* 1.15*  CALCIUM  9.3 8.9 8.4*   GFR: CrCl cannot be calculated (Unknown ideal weight.). Liver Function Tests: No results for input(s): AST, ALT, ALKPHOS, BILITOT, PROT, ALBUMIN in the last 168 hours. No results for input(s): LIPASE, AMYLASE in the last 168 hours. No results for input(s): AMMONIA in the last 168 hours. Coagulation Profile: No results for input(s): INR, PROTIME in the last 168 hours. Cardiac Enzymes: No results for input(s): CKTOTAL, CKMB, CKMBINDEX, TROPONINI in the last 168 hours. BNP (last 3 results) No results for input(s): PROBNP in the last 8760 hours. HbA1C: No results for input(s): HGBA1C in the last 72 hours. CBG: No results for input(s): GLUCAP in the last 168 hours. Lipid Profile: No results for input(s): CHOL, HDL, LDLCALC, TRIG, CHOLHDL, LDLDIRECT in the last 72 hours. Thyroid Function Tests: No results for input(s): TSH, T4TOTAL, FREET4, T3FREE, THYROIDAB in the last 72 hours. Anemia Panel: No results for input(s): VITAMINB12, FOLATE, FERRITIN, TIBC, IRON , RETICCTPCT in the last 72 hours. Sepsis Labs: No results for input(s): PROCALCITON, LATICACIDVEN in the last 168 hours.  Recent Results (from the past 240 hours)  Respiratory (~20 pathogens) panel by PCR     Status: Abnormal   Collection Time: 07/25/23  1:46 PM   Specimen: Nasopharyngeal Swab; Respiratory  Result Value Ref Range Status   Adenovirus NOT DETECTED NOT DETECTED Final   Coronavirus 229E NOT DETECTED NOT DETECTED Final    Comment: (NOTE) The Coronavirus on the Respiratory Panel, DOES NOT test for the novel  Coronavirus (2019 nCoV)    Coronavirus HKU1 NOT DETECTED NOT DETECTED Final   Coronavirus NL63 NOT DETECTED NOT DETECTED Final   Coronavirus OC43 NOT DETECTED NOT DETECTED Final   Metapneumovirus NOT DETECTED  NOT DETECTED Final   Rhinovirus / Enterovirus NOT DETECTED NOT DETECTED Final   Influenza A NOT DETECTED NOT DETECTED Final   Influenza B NOT DETECTED NOT DETECTED Final   Parainfluenza Virus 1 NOT DETECTED NOT DETECTED Final   Parainfluenza Virus 2 NOT DETECTED NOT DETECTED Final   Parainfluenza Virus 3 DETECTED (A) NOT DETECTED Final   Parainfluenza Virus 4 NOT DETECTED NOT DETECTED Final   Respiratory Syncytial Virus NOT DETECTED NOT DETECTED Final   Bordetella pertussis NOT DETECTED NOT DETECTED Final   Bordetella Parapertussis NOT DETECTED NOT DETECTED Final   Chlamydophila pneumoniae NOT DETECTED NOT DETECTED Final   Mycoplasma pneumoniae NOT DETECTED NOT DETECTED Final    Comment: Performed at [facility], [address] [place], [place] [address]         Radiology Studies: ECHOCARDIOGRAM COMPLETE Result Date: 07/26/2023    ECHOCARDIOGRAM REPORT   Patient Name:   ASHTON SABINE Date of Exam: 07/26/2023 Medical Rec #:  [id number]        Height:       64.0 in Accession #:    [id number]       Weight:       250.0 lb Date of Birth:  07-14-1940       BSA:          2.151 m Patient Age:  82 years         BP:           83/51 mmHg Patient Gender: F                HR:           85 bpm. Exam Location:  Inpatient Procedure: 2D Echo, Cardiac Doppler and Color Doppler (Both Spectral and Color            Flow Doppler were utilized during procedure). Indications:    CHF  History:        Patient has prior history of Echocardiogram examinations, most                 recent 08/01/2022. Risk Factors:Hypertension.  Sonographer:    Benard Stallion Referring Phys: [id number] CONCEPCION RISER IMPRESSIONS  1. Left ventricular ejection fraction, by estimation, is 70 to 75%. The left ventricle has hyperdynamic function. The left ventricle has no regional wall motion abnormalities. There is mild concentric left ventricular hypertrophy. Left ventricular diastolic parameters are consistent with Grade I  diastolic dysfunction (impaired relaxation).  2. Right ventricular systolic function is normal. The right ventricular size is normal. There is normal pulmonary artery systolic pressure.  3. Left atrial size was mildly dilated.  4. The mitral valve is normal in structure. Trivial mitral valve regurgitation. No evidence of mitral stenosis.  5. The aortic valve is tricuspid. There is mild calcification of the aortic valve. Aortic valve regurgitation is not visualized. Mild aortic valve stenosis. Aortic valve area, by VTI measures 1.88 cm. Aortic valve mean gradient measures 16.0 mmHg. Aortic valve Vmax measures 2.72 m/s.  6. The inferior vena cava is normal in size with greater than 50% respiratory variability, suggesting right atrial pressure of 3 mmHg. FINDINGS  Left Ventricle: Left ventricular ejection fraction, by estimation, is 70 to 75%. The left ventricle has hyperdynamic function. The left ventricle has no regional wall motion abnormalities. The left ventricular internal cavity size was normal in size. There is mild concentric left ventricular hypertrophy. Left ventricular diastolic parameters are consistent with Grade I diastolic dysfunction (impaired relaxation). Right Ventricle: The right ventricular size is normal. No increase in right ventricular wall thickness. Right ventricular systolic function is normal. There is normal pulmonary artery systolic pressure. The tricuspid regurgitant velocity is 1.30 m/s, and  with an assumed right atrial pressure of 8 mmHg, the estimated right ventricular systolic pressure is 14.8 mmHg. Left Atrium: Left atrial size was mildly dilated. Right Atrium: Right atrial size was normal in size. Pericardium: There is no evidence of pericardial effusion. Mitral Valve: The mitral valve is normal in structure. Trivial mitral valve regurgitation. No evidence of mitral valve stenosis. Tricuspid Valve: The tricuspid valve is normal in structure. Tricuspid valve regurgitation is  trivial. No evidence of tricuspid stenosis. Aortic Valve: The aortic valve is tricuspid. There is mild calcification of the aortic valve. Aortic valve regurgitation is not visualized. Mild aortic stenosis is present. Aortic valve mean gradient measures 16.0 mmHg. Aortic valve peak gradient measures 29.5 mmHg. Aortic valve area, by VTI measures 1.88 cm. Pulmonic Valve: The pulmonic valve was normal in structure. Pulmonic valve regurgitation is trivial. No evidence of pulmonic stenosis. Aorta: The aortic root is normal in size and structure. Venous: The inferior vena cava is normal in size with greater than 50% respiratory variability, suggesting right atrial pressure of 3 mmHg. IAS/Shunts: No atrial level shunt detected by color flow Doppler.  LEFT VENTRICLE PLAX 2D LVIDd:  5.30 cm   Diastology LVIDs:         3.20 cm   LV e' medial:    8.05 cm/s LV PW:         1.10 cm   LV E/e' medial:  11.6 LV IVS:        1.10 cm   LV e' lateral:   8.16 cm/s LVOT diam:     2.20 cm   LV E/e' lateral: 11.4 LV SV:         111 LV SV Index:   52 LVOT Area:     3.80 cm  RIGHT VENTRICLE RV Basal diam:  3.70 cm RV Mid diam:    3.40 cm RV S prime:     15.10 cm/s TAPSE (M-mode): 2.6 cm LEFT ATRIUM             Index        RIGHT ATRIUM           Index LA diam:        4.50 cm 2.09 cm/m   RA Area:     14.90 cm LA Vol (A2C):   55.1 ml 25.62 ml/m  RA Volume:   29.40 ml  13.67 ml/m LA Vol (A4C):   47.0 ml 21.85 ml/m LA Biplane Vol: 51.5 ml 23.94 ml/m  AORTIC VALVE AV Area (Vmax):    1.76 cm AV Area (Vmean):   1.74 cm AV Area (VTI):     1.88 cm AV Vmax:           271.67 cm/s AV Vmean:          185.000 cm/s AV VTI:            0.592 m AV Peak Grad:      29.5 mmHg AV Mean Grad:      16.0 mmHg LVOT Vmax:         126.00 cm/s LVOT Vmean:        84.900 cm/s LVOT VTI:          0.292 m LVOT/AV VTI ratio: 0.49  AORTA Ao Root diam: 2.80 cm Ao Asc diam:  3.40 cm MITRAL VALVE                TRICUSPID VALVE MV Area (PHT): 4.21 cm     TR Peak  grad:   6.8 mmHg MV Decel Time: 180 msec     TR Vmax:        130.00 cm/s MV E velocity: 93.40 cm/s MV A velocity: 120.00 cm/s  SHUNTS MV E/A ratio:  0.78         Systemic VTI:  0.29 m                             Systemic Diam: 2.20 cm Toribio Fuel MD Electronically signed by Toribio Fuel MD Signature Date/Time: 07/26/2023/3:33:11 PM    Final         Scheduled Meds:  atorvastatin   10 mg Oral Daily   budesonide -glycopyrrolate -formoterol   2 puff Inhalation BID   carvedilol   3.125 mg Oral BID WC   cholestyramine   4 g Oral BID   enoxaparin  (LOVENOX ) injection  40 mg Subcutaneous Q24H   ferrous sulfate   325 mg Oral Daily   gabapentin   300 mg Oral TID   montelukast   10 mg Oral QHS   PARoxetine   40 mg Oral Daily   predniSONE   40 mg Oral Q breakfast  sodium chloride  flush  10-40 mL Intracatheter Q12H   Continuous Infusions:   LOS: 3 days    Time spent: 35 minutes    Kaylen Motl A Dayne Dekay, MD [facility]   If 7PM-7AM, please contact night-coverage [email or web address]  07/28/2023, 7:37 AM

## 2023-07-28 NOTE — Progress Notes (Signed)
   07/28/23 2256  Respiratory Severity Assessment  Heart Rate 0  Breath Sounds 2  Respiratory Pattern 0  Cough 0  Chest X Ray 1  O2 Requirements/ Pulse Ox Sat(%) 1  Mental Status 0  Dyspnea 1  Score Total 5  Aerosolized Bronchodilators  Aerosolized bronchodilator indications Aerosolized bronchodilator indicated  Medication Plan of Care Hand held neb treatment  Bronchial Hygiene  Bronchial Hygiene Plan of Care Cough & Deep breath  Oxygen Therapy  Oxygen therapy indications Oxygen therapy indicated  Oxygen Plan of care Nasal cannula  Aerosol therapy delivery mode Face mask  Respiratory Therapy Follow Up Assessment  Assessment follow up date 07/29/23   Pt admitted with COPD exacerbation and influenza. Pt takes Albuterol  MDI at home, Pt was started on Albuterol  Q6 nebs, will decreased this to TID per protocol. Patient on 2L [place] and being tested for home O2.

## 2023-07-28 NOTE — Progress Notes (Signed)
 SATURATION QUALIFICATIONS: (This note is used to comply with regulatory documentation for home oxygen)  Patient Saturations on Room Air at Rest = 83%  Patient Saturations on Room Air while Ambulating = n/a-pt desat on RA at rest  Patient Saturations on 1 Liters of oxygen while Ambulating = 88%  Please briefly explain why patient needs home oxygen:Pt unable to maintain SpO2 >90% on RA at rest and requires supplemental O2 to do so.  Darryle George, PTA Acute Rehabilitation Services Secure Chat Preferred  [phone]

## 2023-07-29 DIAGNOSIS — J441 Chronic obstructive pulmonary disease with (acute) exacerbation: Secondary | ICD-10-CM | POA: Diagnosis not present

## 2023-07-29 LAB — BASIC METABOLIC PANEL WITH GFR
Anion gap: 6 (ref 5–15)
BUN: 37 mg/dL — ABNORMAL HIGH (ref 8–23)
CO2: 25 mmol/L (ref 22–32)
Calcium: 8.3 mg/dL — ABNORMAL LOW (ref 8.9–10.3)
Chloride: 109 mmol/L (ref 98–111)
Creatinine, Ser: 0.96 mg/dL (ref 0.44–1.00)
GFR, Estimated: 59 mL/min — ABNORMAL LOW (ref >60)
Glucose, Bld: 123 mg/dL — ABNORMAL HIGH (ref 70–99)
Potassium: 3.9 mmol/L (ref 3.5–5.1)
Sodium: 140 mmol/L (ref 135–145)

## 2023-07-29 LAB — CBC
HCT: 41.6 % (ref 36.0–46.0)
Hemoglobin: 12.4 g/dL (ref 12.0–15.0)
MCH: 27.4 pg (ref 26.0–34.0)
MCHC: 29.8 g/dL — ABNORMAL LOW (ref 30.0–36.0)
MCV: 91.8 fL (ref 80.0–100.0)
Platelets: 251 10*3/uL (ref 150–400)
RBC: 4.53 MIL/uL (ref 3.87–5.11)
RDW: 14.8 % (ref 11.5–15.5)
WBC: 8 10*3/uL (ref 4.0–10.5)
nRBC: 0 % (ref 0.0–0.2)

## 2023-07-29 MED ORDER — PANTOPRAZOLE SODIUM 40 MG PO TBEC
40.0000 mg | DELAYED_RELEASE_TABLET | Freq: Two times a day (BID) | ORAL | Status: DC
  Administered 2023-07-29 – 2023-07-30 (×3): 40 mg via ORAL
  Filled 2023-07-29 (×3): qty 1

## 2023-07-29 MED ORDER — CHLORHEXIDINE GLUCONATE CLOTH 2 % EX PADS
6.0000 | MEDICATED_PAD | Freq: Every day | CUTANEOUS | Status: DC
  Administered 2023-07-30: 6 via TOPICAL

## 2023-07-29 MED ORDER — CHOLESTYRAMINE 4 G PO PACK
4.0000 g | PACK | Freq: Every day | ORAL | Status: DC
  Filled 2023-07-29: qty 1

## 2023-07-29 NOTE — Plan of Care (Signed)
  Problem: Education: Goal: Knowledge of General Education information will improve Description: Including pain rating scale, medication(s)/side effects and non-pharmacologic comfort measures Outcome: Progressing   Problem: Clinical Measurements: Goal: Respiratory complications will improve Outcome: Progressing   Problem: Clinical Measurements: Goal: Cardiovascular complication will be avoided Outcome: Progressing   Problem: Activity: Goal: Risk for activity intolerance will decrease Outcome: Progressing   Problem: Nutrition: Goal: Adequate nutrition will be maintained Outcome: Progressing   Problem: Coping: Goal: Level of anxiety will decrease Outcome: Progressing   Problem: Pain Managment: Goal: General experience of comfort will improve and/or be controlled Outcome: Progressing   Problem: Safety: Goal: Ability to remain free from injury will improve Outcome: Progressing   Problem: Skin Integrity: Goal: Risk for impaired skin integrity will decrease Outcome: Progressing

## 2023-07-29 NOTE — Progress Notes (Signed)
 Incentive spirometry and flutter valve introduced with teach back.

## 2023-07-29 NOTE — Progress Notes (Signed)
 PROGRESS NOTE    [name] [name]  FMW:[id number] DOB: 1941-01-09 DOA: 07/25/2023 PCP: Colette Torrence GRADE, MD   Brief Narrative: 83 year old with past medical history significant for COPD, hypertension, hyperlipidemia, diabetes type 2, OSA on CPAP presenting with worsening shortness of breath, not getting relief with her bronchodilators.  The patient was admitted for COPD exacerbation, found to be positive for parainfluenza virus 3.     Assessment & Plan:   Principal Problem:   COPD with acute exacerbation (HCC) Active Problems:   Coronary artery disease involving native heart without angina pectoris   Hyperlipidemia associated with type 2 diabetes mellitus (HCC)   Obstructive sleep apnea syndrome   Hypertension associated with diabetes (HCC)   Acute hypoxic respiratory failure (HCC)   Type 2 diabetes mellitus with hyperglycemia, without long-term current use of insulin  (HCC)   Parainfluenza infection  1-Acute Hypoxic respiratory failure - Acute COPD exacerbation In the setting of parainfluenza virus positive -Continue with prednisone  - Continue with Brestri.  - Continue with scheduled albuterol  - Continuous pulse oximetry overnight; She will need oxygen at HS and with ambulation.  -Continue with guaifenesin  twice daily -Continue montelukast  -Continue with  Doxy for 5 days.     2-possible acute diastolic heart failure exacerbation ruled out - Echo ejection fraction 70%, grade 1 diastolic dysfunction mild AAS. - Received IV Lasix , will continue to hold today due to mildly increased creatinine - BNP was normal -Renal function improved with holding lasix .   Mild AKI;  In setting diuretics. Cr baseline 0.9 Peak to 1.2 Hold lasix .   Presumed UTI: Received Rocephin  in the ED prescribed Keflex  as an outpatient. - Ceftriaxone  was discontinued because patient developed a rash - UA negative  OSA: Continue with CPAP at at bedtime  Hypertension continue to hold amlodipine   hydrochlorothiazide . Coreg  was decreased to 3.125  Anxiety: Continue with Ativan  Obesity, morbid obesity: Need lifestyle modification  Estimated body mass index is 42.91 kg/m as calculated from the following:   Height as of 06/28/23: 5' 4 (1.626 m).   Weight as of 06/28/23: 113.4 kg.   DVT prophylaxis: Lovenox  Code Status: Full code Family Communication: Son and daughter in law who were at bedside Disposition Plan:  Status is: Inpatient Remains inpatient appropriate because: Plan to discharge tomorrow to ALF, Son needs to arrange CNA care for patient.    Consultants:  none  Procedures:  ECHO  Antimicrobials:    Subjective: She is feeling ok, report feeling tired and weak today. Report tremors, shaking. We discussed tremors could be related to breathing tx. She went to bathroom without oxygen. We discuss she will need to wear oxygen on ambulation. Low oxygen can make her feel SOB, weak.   Objective: Vitals:   07/29/23 0825 07/29/23 0900 07/29/23 1300 07/29/23 1334  BP:  (!) 148/85 (!) 148/60   Pulse:  72 68   Resp:  19 18   Temp:  97.7 F (36.5 C) 98.5 F (36.9 C)   TempSrc:      SpO2: 93% 93% 97% 96%   No intake or output data in the 24 hours ending 07/29/23 1357 There were no vitals filed for this visit.  Examination:  General exam: NAD Respiratory system: Normal Resp effort, No significant wheezing today Cardiovascular system: S 1, S2 RRR Gastrointestinal system: BS present, soft, nt. Central nervous system: Alert, and conversant Extremities: trace edema   Data Reviewed: I have personally reviewed following labs and imaging studies  CBC: Recent Labs  Lab 07/25/23 0901  07/27/23 0512 07/28/23 0626 07/29/23 0451  WBC 7.6 12.9* 11.5* 8.0  HGB 13.7 12.4 12.8 12.4  HCT 45.9 41.4 42.6 41.6  MCV 92.0 91.0 92.2 91.8  PLT 258 277 270 251   Basic Metabolic Panel: Recent Labs  Lab 07/25/23 0901 07/26/23 0403 07/27/23 0512 07/28/23 0626  07/29/23 0451  NA 140 140 137 139 140  K 4.1 4.1 4.1 3.8 3.9  CL 109 109 105 106 109  CO2 22 22 25 26 25   GLUCOSE 122* 190* 175* 140* 123*  BUN 17 18 32* 45* 37*  CREATININE 0.91 1.08* 1.15* 1.20* 0.96  CALCIUM  9.3 8.9 8.4* 8.7* 8.3*   GFR: CrCl cannot be calculated (Unknown ideal weight.). Liver Function Tests: No results for input(s): AST, ALT, ALKPHOS, BILITOT, PROT, ALBUMIN in the last 168 hours. No results for input(s): LIPASE, AMYLASE in the last 168 hours. No results for input(s): AMMONIA in the last 168 hours. Coagulation Profile: No results for input(s): INR, PROTIME in the last 168 hours. Cardiac Enzymes: No results for input(s): CKTOTAL, CKMB, CKMBINDEX, TROPONINI in the last 168 hours. BNP (last 3 results) No results for input(s): PROBNP in the last 8760 hours. HbA1C: No results for input(s): HGBA1C in the last 72 hours. CBG: No results for input(s): GLUCAP in the last 168 hours. Lipid Profile: No results for input(s): CHOL, HDL, LDLCALC, TRIG, CHOLHDL, LDLDIRECT in the last 72 hours. Thyroid Function Tests: No results for input(s): TSH, T4TOTAL, FREET4, T3FREE, THYROIDAB in the last 72 hours. Anemia Panel: No results for input(s): VITAMINB12, FOLATE, FERRITIN, TIBC, IRON , RETICCTPCT in the last 72 hours. Sepsis Labs: No results for input(s): PROCALCITON, LATICACIDVEN in the last 168 hours.  Recent Results (from the past 240 hours)  Respiratory (~20 pathogens) panel by PCR     Status: Abnormal   Collection Time: 07/25/23  1:46 PM   Specimen: Nasopharyngeal Swab; Respiratory  Result Value Ref Range Status   Adenovirus NOT DETECTED NOT DETECTED Final   Coronavirus 229E NOT DETECTED NOT DETECTED Final    Comment: (NOTE) The Coronavirus on the Respiratory Panel, DOES NOT test for the novel  Coronavirus (2019 nCoV)    Coronavirus HKU1 NOT DETECTED NOT DETECTED Final   Coronavirus NL63 NOT  DETECTED NOT DETECTED Final   Coronavirus OC43 NOT DETECTED NOT DETECTED Final   Metapneumovirus NOT DETECTED NOT DETECTED Final   Rhinovirus / Enterovirus NOT DETECTED NOT DETECTED Final   Influenza A NOT DETECTED NOT DETECTED Final   Influenza B NOT DETECTED NOT DETECTED Final   Parainfluenza Virus 1 NOT DETECTED NOT DETECTED Final   Parainfluenza Virus 2 NOT DETECTED NOT DETECTED Final   Parainfluenza Virus 3 DETECTED (A) NOT DETECTED Final   Parainfluenza Virus 4 NOT DETECTED NOT DETECTED Final   Respiratory Syncytial Virus NOT DETECTED NOT DETECTED Final   Bordetella pertussis NOT DETECTED NOT DETECTED Final   Bordetella Parapertussis NOT DETECTED NOT DETECTED Final   Chlamydophila pneumoniae NOT DETECTED NOT DETECTED Final   Mycoplasma pneumoniae NOT DETECTED NOT DETECTED Final    Comment: Performed at [facility], [address], [place], [place] [address]         Radiology Studies: No results found.       Scheduled Meds:  albuterol   2.5 mg Nebulization TID   atorvastatin   10 mg Oral Daily   budesonide -glycopyrrolate -formoterol   2 puff Inhalation BID   carvedilol   3.125 mg Oral BID WC   [START ON 07/30/2023] Chlorhexidine Gluconate Cloth  6 each  Topical Q0600   [START ON 07/30/2023] cholestyramine   4 g Oral Daily   enoxaparin  (LOVENOX ) injection  40 mg Subcutaneous Q24H   ferrous sulfate   325 mg Oral Daily   gabapentin   300 mg Oral TID   guaiFENesin   1,200 mg Oral BID   montelukast   10 mg Oral QHS   pantoprazole   40 mg Oral BID   PARoxetine   40 mg Oral Daily   predniSONE   40 mg Oral Q breakfast   sodium chloride  flush  10-40 mL Intracatheter Q12H   Continuous Infusions:   LOS: 4 days    Time spent: 35 minutes    Claudine Stallings A Tishara Pizano, MD [facility]   If 7PM-7AM, please contact night-coverage [email or web address]  07/29/2023, 1:57 PM

## 2023-07-29 NOTE — Progress Notes (Signed)
 Overnight pulse oximetry study completed, printed and placed in pt shadow chart at nursing station.

## 2023-07-29 NOTE — Plan of Care (Signed)

## 2023-07-30 ENCOUNTER — Telehealth: Payer: Self-pay

## 2023-07-30 MED ORDER — PANTOPRAZOLE SODIUM 40 MG PO TBEC: 40.0000 mg | DELAYED_RELEASE_TABLET | Freq: Every day | ORAL | 0 refills | Status: DC

## 2023-07-30 MED ORDER — ALBUTEROL SULFATE (2.5 MG/3ML) 0.083% IN NEBU: 2.5000 mg | INHALATION_SOLUTION | Freq: Two times a day (BID) | RESPIRATORY_TRACT | Status: DC

## 2023-07-30 MED ORDER — MONTELUKAST SODIUM 10 MG PO TABS: 10.0000 mg | ORAL_TABLET | Freq: Every day | ORAL | 0 refills | Status: DC

## 2023-07-30 MED ORDER — GUAIFENESIN 100 MG/5ML PO LIQD: 5.0000 mL | ORAL | 0 refills | Status: AC | PRN

## 2023-07-30 MED ORDER — PREDNISONE 20 MG PO TABS: 40.0000 mg | ORAL_TABLET | Freq: Every day | ORAL | 0 refills | Status: AC

## 2023-07-30 NOTE — Telephone Encounter (Signed)
 LVM for Home health representative to give us  a call back to give more detail on what needs to be signed off on by Provider .    Copied from CRM [id number]. Topic: General - Other >> Jul 30, 2023  3:32 PM Carlatta H wrote: Reason for CRM: Andree would like to know if PCP will sign off on orders for patient once discharged// Please call [phone]   ----------------------------------------------------------------------- From previous Reason for Contact - Home Health Verbal Orders: Caller/Agency:  Callback Number:  Service Requested: Physical Therapy/Ot Therapy  Frequency:  Any new concerns about the patient?

## 2023-07-30 NOTE — Progress Notes (Addendum)
 Mobility Specialist: Progress Note   07/30/23 1130  Mobility  Activity Ambulated with assistance in hallway  Level of Assistance Standby assist, set-up cues, supervision of patient - no hands on  Assistive Device Front wheel walker  Distance Ambulated (ft) 175 ft  Activity Response Tolerated well  Mobility Referral Yes  Mobility visit 1 Mobility  Mobility Specialist Start Time (ACUTE ONLY) 1110  Mobility Specialist Stop Time (ACUTE ONLY) 1125  Mobility Specialist Time Calculation (min) (ACUTE ONLY) 15 min    Pre Mobility: SpO2 92% RA During Mobility: SpO2 87-90% RA Post Mobility: SpO2 92%  Pt received in chair, agreeable to mobility session. Completed ambulatory sat test (see following note). No complaints. Desat 2x during ambulation to SpO2 87% on RA while pt was talking, but pt immediately recovered SpO2 89-90% - no standing or seated breaks needed. Returned to room without fault. Left on EOB on RA with all needs met, call bell in reach. Family in room.   Ileana Lute Mobility Specialist Please contact via SecureChat or Rehab office at [phone]

## 2023-07-30 NOTE — Plan of Care (Signed)
  Problem: Education: Goal: Knowledge of General Education information will improve Description: Including pain rating scale, medication(s)/side effects and non-pharmacologic comfort measures 07/30/2023 0633 by Rakhi Romagnoli, RN Outcome: Progressing 07/30/2023 0633 by Gurney Balthazor, RN Outcome: Not Progressing   Problem: Health Behavior/Discharge Planning: Goal: Ability to manage health-related needs will improve 07/30/2023 0633 by Wasif Simonich, RN Outcome: Progressing 07/30/2023 0633 by Tiffanie Blassingame, RN Outcome: Not Progressing   Problem: Clinical Measurements: Goal: Ability to maintain clinical measurements within normal limits will improve 07/30/2023 0633 by Opal Mckellips, RN Outcome: Progressing 07/30/2023 0633 by Wyolene Weimann, RN Outcome: Not Progressing Goal: Will remain free from infection 07/30/2023 0633 by Terah Robey, RN Outcome: Progressing 07/30/2023 0633 by Parul Porcelli, RN Outcome: Not Progressing Goal: Diagnostic test results will improve 07/30/2023 0633 by Cassell Voorhies, RN Outcome: Progressing 07/30/2023 0633 by Nandan Willems, RN Outcome: Not Progressing Goal: Respiratory complications will improve 07/30/2023 0633 by Arvell Pulsifer, RN Outcome: Progressing 07/30/2023 0633 by Lawerance Matsuo, RN Outcome: Not Progressing Goal: Cardiovascular complication will be avoided 07/30/2023 0633 by Germaine Ripp, RN Outcome: Progressing 07/30/2023 0633 by Kyl Givler, RN Outcome: Not Progressing   Problem: Activity: Goal: Risk for activity intolerance will decrease 07/30/2023 0633 by Rie Mcneil, RN Outcome: Progressing 07/30/2023 0633 by Mickala Laton, RN Outcome: Not Progressing   Problem: Nutrition: Goal: Adequate nutrition will be maintained 07/30/2023 0633 by Zakiyyah Savannah, RN Outcome: Progressing 07/30/2023 0633 by Rashay Barnette, RN Outcome: Not Progressing   Problem:  Coping: Goal: Level of anxiety will decrease 07/30/2023 0633 by Angelino Rumery, RN Outcome: Progressing 07/30/2023 0633 by Nerissa Constantin, RN Outcome: Not Progressing   Problem: Elimination: Goal: Will not experience complications related to bowel motility 07/30/2023 0633 by Malini Flemings, RN Outcome: Progressing 07/30/2023 0633 by Jeimy Bickert, RN Outcome: Not Progressing Goal: Will not experience complications related to urinary retention 07/30/2023 0633 by Cuyler Vandyken, RN Outcome: Progressing 07/30/2023 0633 by Dray Dente, RN Outcome: Not Progressing   Problem: Pain Managment: Goal: General experience of comfort will improve and/or be controlled 07/30/2023 0633 by Edeline Greening, RN Outcome: Progressing 07/30/2023 0633 by Laparis Durrett, RN Outcome: Not Progressing   Problem: Safety: Goal: Ability to remain free from injury will improve 07/30/2023 0633 by Kristan Brummitt, RN Outcome: Progressing 07/30/2023 0633 by Harish Bram, RN Outcome: Not Progressing   Problem: Skin Integrity: Goal: Risk for impaired skin integrity will decrease 07/30/2023 0633 by Kinsler Soeder, RN Outcome: Progressing 07/30/2023 0633 by Javarion Douty, RN Outcome: Not Progressing

## 2023-07-30 NOTE — TOC Progression Note (Signed)
 Transition of Care [facility]) - Progression Note    Patient Details  Name: [name] [name] MRN: [id number] Date of Birth: 25-Jan-1941  [facility] CM/SW Contact  Madgie Dhaliwal A [place], LCSW Phone Number: 07/30/2023, 10:47 AM  Clinical Narrative:     CSW met with pt and pt's son and daughter in law at bedside. She stated that she was from [facility] ILF in [place] before she was admitted. CSW informed her that she had a recommendation for home health. She said that she has worked with [facility] in the past. She also was concerned that she may need a higher level of care at DC, possible SNF. CSW informed her that PT is working with her today and that they will provide recommendation and assist pt in making informed decision for Home with HH vs SNF. RNCM notified of pending disposition.    TOC will continue to follow.        Expected Discharge Plan and Services   Discharge Planning Services: CM Consult                                           Social Determinants of Health (SDOH) Interventions SDOH Screenings   Food Insecurity: No Food Insecurity (07/25/2023)  Housing: Low Risk  (07/25/2023)  Transportation Needs: No Transportation Needs (07/25/2023)  Utilities: Not At Risk (07/25/2023)  Alcohol Screen: Low Risk  (04/24/2023)  Depression (PHQ2-9): Low Risk  (04/24/2023)  Financial Resource Strain: Low Risk  (04/24/2023)  Physical Activity: Insufficiently Active (04/24/2023)  Social Connections: Moderately Isolated (07/25/2023)  Stress: No Stress Concern Present (04/24/2023)  Tobacco Use: Medium Risk (07/25/2023)  Health Literacy: Adequate Health Literacy (04/24/2023)    Readmission Risk Interventions     No data to display

## 2023-07-30 NOTE — Discharge Summary (Addendum)
 Physician Discharge Summary  [name] [name] FMW:[id number] DOB: 29-Jun-1940 DOA: 07/25/2023  PCP: Colette Torrence GRADE, MD  Admit date: 07/25/2023 Discharge date: 07/30/2023  Admitted From: Home  Discharge disposition: Home with home health   Recommendations for Outpatient Follow-Up:   Follow up with your primary care provider in one week.  Check CBC, BMP, magnesium  in the next visit   Discharge Diagnosis:   Principal Problem:   COPD with acute exacerbation (HCC) Active Problems:   Coronary artery disease involving native heart without angina pectoris   Hyperlipidemia associated with type 2 diabetes mellitus (HCC)   Obstructive sleep apnea syndrome   Hypertension associated with diabetes (HCC)   Acute hypoxic respiratory failure (HCC)   Type 2 diabetes mellitus with hyperglycemia, without long-term current use of insulin  (HCC)   Parainfluenza infection  Discharge Condition: Improved.  Diet recommendation: Low sodium, heart healthy.  Carbohydrate-modified.  Wound care: None.  Code status: Full.   History of Present Illness:   83 year old female with past medical history significant for COPD, hypertension, hyperlipidemia, diabetes type 2, OSA on CPAP presented to hospital with worsening shortness of breath, not getting relief with her bronchodilators. The patient was admitted for COPD exacerbation, found to be positive for parainfluenza virus 3.   Hospital Course:   Following conditions were addressed during hospitalization as listed below,  Acute Hypoxic respiratory failure secondary to Acute COPD exacerbation, on admission. Likely from parainfluenza virus infection Continue steroids inhalers montelukast  and doxycycline .  2D Echo ejection fraction 70%, grade 1 diastolic dysfunction mild AAS.  Patient was subsequently weaned off the oxygen on discharge.  Patient initially had a pulse ox of 80% in room air and improved to 97% on 2 L.  Did have a respiratory difficulty  including shortness of breath and dyspnea on presentation  Mild AKI;  In setting diuretics. Cr baseline 0.9. Has improved after discontinuation of Lasix   Ruled out UTI:  Rocephin  was discontinued due to rash.  Urinalysis was negative   OSA: Continue with CPAP at at bedtime   Hypertension resume medications on discharge.   Anxiety: Continue with Ativan   Obesity, morbid obesity:  There is no height or weight on file to calculate BMI.    Disposition.  At this time, patient is stable for disposition home with outpatient PCP follow-up with Home health on discharge.  Spoke with the patient's family prior to discharge.  Medical Consultants:   None.  Procedures:    None Subjective:   Today, patient was seen and examined at bedside.  Feels better.  Ambulated without need for supplemental oxygen.  PT has recommended home health PT on discharge.  Discharge Exam:   Vitals:   07/30/23 0901 07/30/23 0904  BP:    Pulse:    Resp:    Temp:    SpO2: 91% 90%   Vitals:   07/30/23 0358 07/30/23 0825 07/30/23 0901 07/30/23 0904  BP: (!) 175/76 100/83    Pulse: 71 70    Resp: 20 17    Temp: 97.6 F (36.4 C) 97.9 F (36.6 C)    TempSrc:      SpO2: 90% 91% 91% 90%    General: Alert awake, not in obvious distress, obese built, HENT: pupils equally reacting to light,  No scleral pallor or icterus noted. Oral mucosa is moist.  Chest:    Diminished breath sounds bilaterally.  CVS: S1 &S2 heard. No murmur.  Regular rate and rhythm. Abdomen: Soft, nontender, nondistended.  Bowel sounds are heard.  Extremities: No cyanosis, clubbing with trace edema.  Peripheral pulses are palpable. Psych: Alert, awake and oriented, normal mood CNS:  No cranial nerve deficits.  Power equal in all extremities.   Skin: Warm and dry.  No rashes noted.  The results of significant diagnostics from this hospitalization (including imaging, microbiology, ancillary and laboratory) are listed below for  reference.     Diagnostic Studies:   ECHOCARDIOGRAM COMPLETE Result Date: 07/26/2023    ECHOCARDIOGRAM REPORT   Patient Name:   [name] [name] Date of Exam: 07/26/2023 Medical Rec #:  [id number]        Height:       64.0 in Accession #:    [id number]       Weight:       250.0 lb Date of Birth:  01/07/1941       BSA:          2.151 m Patient Age:    82 years         BP:           83/51 mmHg Patient Gender: F                HR:           85 bpm. Exam Location:  Inpatient Procedure: 2D Echo, Cardiac Doppler and Color Doppler (Both Spectral and Color            Flow Doppler were utilized during procedure). Indications:    CHF  History:        Patient has prior history of Echocardiogram examinations, most                 recent 08/01/2022. Risk Factors:Hypertension.  Sonographer:    Benard Stallion Referring Phys: [id number] CONCEPCION RISER IMPRESSIONS  1. Left ventricular ejection fraction, by estimation, is 70 to 75%. The left ventricle has hyperdynamic function. The left ventricle has no regional wall motion abnormalities. There is mild concentric left ventricular hypertrophy. Left ventricular diastolic parameters are consistent with Grade I diastolic dysfunction (impaired relaxation).  2. Right ventricular systolic function is normal. The right ventricular size is normal. There is normal pulmonary artery systolic pressure.  3. Left atrial size was mildly dilated.  4. The mitral valve is normal in structure. Trivial mitral valve regurgitation. No evidence of mitral stenosis.  5. The aortic valve is tricuspid. There is mild calcification of the aortic valve. Aortic valve regurgitation is not visualized. Mild aortic valve stenosis. Aortic valve area, by VTI measures 1.88 cm. Aortic valve mean gradient measures 16.0 mmHg. Aortic valve Vmax measures 2.72 m/s.  6. The inferior vena cava is normal in size with greater than 50% respiratory variability, suggesting right atrial pressure of 3 mmHg. FINDINGS  Left  Ventricle: Left ventricular ejection fraction, by estimation, is 70 to 75%. The left ventricle has hyperdynamic function. The left ventricle has no regional wall motion abnormalities. The left ventricular internal cavity size was normal in size. There is mild concentric left ventricular hypertrophy. Left ventricular diastolic parameters are consistent with Grade I diastolic dysfunction (impaired relaxation). Right Ventricle: The right ventricular size is normal. No increase in right ventricular wall thickness. Right ventricular systolic function is normal. There is normal pulmonary artery systolic pressure. The tricuspid regurgitant velocity is 1.30 m/s, and  with an assumed right atrial pressure of 8 mmHg, the estimated right ventricular systolic pressure is 14.8 mmHg. Left Atrium: Left atrial size was mildly dilated. Right Atrium: Right atrial size was normal in size. Pericardium:  There is no evidence of pericardial effusion. Mitral Valve: The mitral valve is normal in structure. Trivial mitral valve regurgitation. No evidence of mitral valve stenosis. Tricuspid Valve: The tricuspid valve is normal in structure. Tricuspid valve regurgitation is trivial. No evidence of tricuspid stenosis. Aortic Valve: The aortic valve is tricuspid. There is mild calcification of the aortic valve. Aortic valve regurgitation is not visualized. Mild aortic stenosis is present. Aortic valve mean gradient measures 16.0 mmHg. Aortic valve peak gradient measures 29.5 mmHg. Aortic valve area, by VTI measures 1.88 cm. Pulmonic Valve: The pulmonic valve was normal in structure. Pulmonic valve regurgitation is trivial. No evidence of pulmonic stenosis. Aorta: The aortic root is normal in size and structure. Venous: The inferior vena cava is normal in size with greater than 50% respiratory variability, suggesting right atrial pressure of 3 mmHg. IAS/Shunts: No atrial level shunt detected by color flow Doppler.  LEFT VENTRICLE PLAX 2D LVIDd:          5.30 cm   Diastology LVIDs:         3.20 cm   LV e' medial:    8.05 cm/s LV PW:         1.10 cm   LV E/e' medial:  11.6 LV IVS:        1.10 cm   LV e' lateral:   8.16 cm/s LVOT diam:     2.20 cm   LV E/e' lateral: 11.4 LV SV:         111 LV SV Index:   52 LVOT Area:     3.80 cm  RIGHT VENTRICLE RV Basal diam:  3.70 cm RV Mid diam:    3.40 cm RV S prime:     15.10 cm/s TAPSE (M-mode): 2.6 cm LEFT ATRIUM             Index        RIGHT ATRIUM           Index LA diam:        4.50 cm 2.09 cm/m   RA Area:     14.90 cm LA Vol (A2C):   55.1 ml 25.62 ml/m  RA Volume:   29.40 ml  13.67 ml/m LA Vol (A4C):   47.0 ml 21.85 ml/m LA Biplane Vol: 51.5 ml 23.94 ml/m  AORTIC VALVE AV Area (Vmax):    1.76 cm AV Area (Vmean):   1.74 cm AV Area (VTI):     1.88 cm AV Vmax:           271.67 cm/s AV Vmean:          185.000 cm/s AV VTI:            0.592 m AV Peak Grad:      29.5 mmHg AV Mean Grad:      16.0 mmHg LVOT Vmax:         126.00 cm/s LVOT Vmean:        84.900 cm/s LVOT VTI:          0.292 m LVOT/AV VTI ratio: 0.49  AORTA Ao Root diam: 2.80 cm Ao Asc diam:  3.40 cm MITRAL VALVE                TRICUSPID VALVE MV Area (PHT): 4.21 cm     TR Peak grad:   6.8 mmHg MV Decel Time: 180 msec     TR Vmax:        130.00 cm/s MV E velocity: 93.40 cm/s MV A  velocity: 120.00 cm/s  SHUNTS MV E/A ratio:  0.78         Systemic VTI:  0.29 m                             Systemic Diam: 2.20 cm Toribio Fuel MD Electronically signed by Toribio Fuel MD Signature Date/Time: 07/26/2023/3:33:11 PM    Final    DG Chest 2 View Result Date: 07/25/2023 CLINICAL DATA:  Shortness of breath. EXAM: CHEST - 2 VIEW COMPARISON:  03/24/2021 FINDINGS: Low volume film. Left base opacity has been present on multiple prior studies back to 07/13/2020 and compatible with prominent fat pad as seen on CT 03/30/2023. Opacity at the left base is slightly more prominent today which may be technical. Component of left base collapse/consolidation or small  left effusion cannot be excluded. The cardio pericardial silhouette is enlarged. Right lung clear. Mild vascular congestion without edema. Bones are diffusely demineralized. IMPRESSION: 1. Enlargement of the cardiopericardial silhouette with mild vascular congestion. 2. More pronounced retrocardiac left base opacity on today's study in this patient with known prominent pericardial fat pad. This may be technical although new atelectasis or infiltrate or effusion is not excluded. CT imaging could be used to further evaluate as clinically warranted. Electronically Signed   By: Camellia Candle M.D.   On: 07/25/2023 11:18     Labs:   Basic Metabolic Panel: Recent Labs  Lab 07/25/23 0901 07/26/23 0403 07/27/23 0512 07/28/23 0626 07/29/23 0451  NA 140 140 137 139 140  K 4.1 4.1 4.1 3.8 3.9  CL 109 109 105 106 109  CO2 22 22 25 26 25   GLUCOSE 122* 190* 175* 140* 123*  BUN 17 18 32* 45* 37*  CREATININE 0.91 1.08* 1.15* 1.20* 0.96  CALCIUM  9.3 8.9 8.4* 8.7* 8.3*   GFR CrCl cannot be calculated (Unknown ideal weight.). Liver Function Tests: No results for input(s): AST, ALT, ALKPHOS, BILITOT, PROT, ALBUMIN in the last 168 hours. No results for input(s): LIPASE, AMYLASE in the last 168 hours. No results for input(s): AMMONIA in the last 168 hours. Coagulation profile No results for input(s): INR, PROTIME in the last 168 hours.  CBC: Recent Labs  Lab 07/25/23 0901 07/27/23 0512 07/28/23 0626 07/29/23 0451  WBC 7.6 12.9* 11.5* 8.0  HGB 13.7 12.4 12.8 12.4  HCT 45.9 41.4 42.6 41.6  MCV 92.0 91.0 92.2 91.8  PLT 258 277 270 251   Cardiac Enzymes: No results for input(s): CKTOTAL, CKMB, CKMBINDEX, TROPONINI in the last 168 hours. BNP: Invalid input(s): POCBNP CBG: No results for input(s): GLUCAP in the last 168 hours. D-Dimer No results for input(s): DDIMER in the last 72 hours. Hgb A1c No results for input(s): HGBA1C in the last 72  hours. Lipid Profile No results for input(s): CHOL, HDL, LDLCALC, TRIG, CHOLHDL, LDLDIRECT in the last 72 hours. Thyroid function studies No results for input(s): TSH, T4TOTAL, T3FREE, THYROIDAB in the last 72 hours.  Invalid input(s): FREET3 Anemia work up No results for input(s): VITAMINB12, FOLATE, FERRITIN, TIBC, IRON , RETICCTPCT in the last 72 hours. Microbiology Recent Results (from the past 240 hours)  Respiratory (~20 pathogens) panel by PCR     Status: Abnormal   Collection Time: 07/25/23  1:46 PM   Specimen: Nasopharyngeal Swab; Respiratory  Result Value Ref Range Status   Adenovirus NOT DETECTED NOT DETECTED Final   Coronavirus 229E NOT DETECTED NOT DETECTED Final    Comment: (NOTE) The Coronavirus  on the Respiratory Panel, DOES NOT test for the novel  Coronavirus (2019 nCoV)    Coronavirus HKU1 NOT DETECTED NOT DETECTED Final   Coronavirus NL63 NOT DETECTED NOT DETECTED Final   Coronavirus OC43 NOT DETECTED NOT DETECTED Final   Metapneumovirus NOT DETECTED NOT DETECTED Final   Rhinovirus / Enterovirus NOT DETECTED NOT DETECTED Final   Influenza A NOT DETECTED NOT DETECTED Final   Influenza B NOT DETECTED NOT DETECTED Final   Parainfluenza Virus 1 NOT DETECTED NOT DETECTED Final   Parainfluenza Virus 2 NOT DETECTED NOT DETECTED Final   Parainfluenza Virus 3 DETECTED (A) NOT DETECTED Final   Parainfluenza Virus 4 NOT DETECTED NOT DETECTED Final   Respiratory Syncytial Virus NOT DETECTED NOT DETECTED Final   Bordetella pertussis NOT DETECTED NOT DETECTED Final   Bordetella Parapertussis NOT DETECTED NOT DETECTED Final   Chlamydophila pneumoniae NOT DETECTED NOT DETECTED Final   Mycoplasma pneumoniae NOT DETECTED NOT DETECTED Final    Comment: Performed at [facility], [address], [place], [place] [address]     Discharge Instructions:   Discharge Instructions     Diet general   Complete by: As directed    Discharge  instructions   Complete by: As directed    Follow-up with your primary care provider in 1 week.  Check blood work at that time.  Seek medical attention for worsening symptoms.   Increase activity slowly   Complete by: As directed    No wound care   Complete by: As directed       Allergies as of 07/30/2023       Reactions   Azithromycin  Anaphylaxis, Hives   Bee Venom Anaphylaxis   Erythromycin Base Anaphylaxis   Metronidazole Anaphylaxis   Nitrofurantoin Macrocrystal Anaphylaxis   Penicillins Anaphylaxis, Hives   childhood   Sulfa Antibiotics Anaphylaxis, Hives   Childhood   Ceftriaxone  Sodium In Dextrose  Rash   Patient report bad rash.    Cortisone    Other Reaction(s): Not available   Cymbalta [duloxetine Hcl]    Ivp Dye [iodinated Contrast Media]    Penicillin G Hives   Codeine Rash   Erythromycin Rash, Hives   Fosfomycin Rash   Levofloxacin Rash   Nitrofurantoin Hives   Percocet [oxycodone-acetaminophen ] Rash        Medication List     PAUSE taking these medications    carvedilol  6.25 MG tablet Wait to take this until: August 01, 2023 Morning Commonly known as: COREG  TAKE 1 TABLET BY MOUTH 2 TIMES DAILY WITH A MEAL.       STOP taking these medications    cephALEXin  500 MG capsule Commonly known as: KEFLEX    cloNIDine  0.1 MG tablet Commonly known as: CATAPRES    colchicine -probenecid  0.5-500 MG tablet   diclofenac  Sodium 1 % Gel Commonly known as: Voltaren  Arthritis Pain   Lactase 9000 units Tabs   methylPREDNISolone  4 MG Tbpk tablet Commonly known as: MEDROL  DOSEPAK       TAKE these medications    albuterol  108 (90 Base) MCG/ACT inhaler Commonly known as: VENTOLIN  HFA INHALE 1-2 PUFFS INTO THE LUNGS EVERY 4 HOURS AS NEEDED FOR WHEEZING OR SHORTNESS OF BREATH.   ALPRAZolam  0.25 MG tablet Commonly known as: XANAX  TAKE (1) TABLET BY MOUTH TWICE DAILY AT 11AM & 5PM FOR ANXIETY. What changed: See the new instructions.   AMBULATORY NON  FORMULARY MEDICATION One four wheel rolling walker with seat and hand brakes. Brand/style covered by insurance. Medically necessary for  ICD-10: R26.81, Z74.1   amLODIPine -Valsartan -HCTZ 10-320-25 MG Tabs Take 1 tablet by mouth daily.   Arthritis Pain Relieving 0.075 % topical cream Generic drug: capsicum Apply 1 application topically 3 (three) times daily as needed (neuropathic pain in feet).   atorvastatin  10 MG tablet Commonly known as: LIPITOR TAKE 1 TABLET BY MOUTH EVERY DAY   blood glucose meter kit and supplies Kit Meter, test strips, lancets, and alcohol swabs. Use for testing blood sugar every morning before meals and up to 3 additional times per day as needed. Brand based on patient and insurance coverage. 100 strips with 99 refills, 100 lancets with 99 refills, 200 alcohol swabs with 99 refills. Dx: E11.65   cetirizine 10 MG tablet Commonly known as: ZYRTEC TAKE 1 TABLET BY MOUTH EVERY DAY   cholestyramine  4 g packet Commonly known as: QUESTRAN  Take 1 packet (4 g total) by mouth 2 (two) times daily.   EasyMax Test test strip Generic drug: glucose blood   Farxiga  10 MG Tabs tablet Generic drug: dapagliflozin  propanediol TAKE 1 TABLET BY MOUTH DAILY BEFORE BREAKFAST.   ferrous sulfate  325 (65 FE) MG tablet TAKE 1 TABLET BY MOUTH EVERY DAY   gabapentin  300 MG capsule Commonly known as: NEURONTIN  Take 300 mg by mouth 3 (three) times daily.   guaiFENesin  100 MG/5ML liquid Commonly known as: ROBITUSSIN Take 5 mLs by mouth every 4 (four) hours as needed for cough or to loosen phlegm.   hydrALAZINE  25 MG tablet Commonly known as: APRESOLINE  TAKE 1 TABLET BY MOUTH TWICE A DAY   lidocaine  5 % Commonly known as: Lidoderm  Place 1 patch onto the skin daily. Remove & Discard patch within 12 hours or as directed by MD   methenamine  1 g tablet Commonly known as: HIPREX  Take 1 tablet (1 g total) by mouth 2 (two) times daily with a meal.   mirtazapine  7.5 MG  tablet Commonly known as: REMERON  Take 7.5 mg by mouth at bedtime.   montelukast  10 MG tablet Commonly known as: SINGULAIR  Take 1 tablet (10 mg total) by mouth at bedtime.   Myrbetriq  25 MG Tb24 tablet Generic drug: mirabegron  ER TAKE 1 TABLET (25 MG TOTAL) BY MOUTH DAILY.   nystatin  ointment Commonly known as: MYCOSTATIN  Apply 1 Application topically 2 (two) times daily.   pantoprazole  40 MG tablet Commonly known as: PROTONIX  Take 1 tablet (40 mg total) by mouth daily for 14 days.   PARoxetine  40 MG tablet Commonly known as: PAXIL  Take 40 mg by mouth daily.   predniSONE  20 MG tablet Commonly known as: DELTASONE  Take 2 tablets (40 mg total) by mouth daily with breakfast for 3 days.   Sarna lotion Generic drug: camphor-menthol Apply 1 application topically as needed for itching. What changed: when to take this   Trelegy Ellipta  100-62.5-25 MCG/ACT Aepb Generic drug: Fluticasone -Umeclidin-Vilant INHALE 1 PUFF BY MOUTH INTO THE LUNGS DAILY. ** RINSE MOUTH AFTER USE**   Yuvafem  10 MCG Tabs vaginal tablet Generic drug: Estradiol  PLACE 1 TABLET VAGINALLY TWICE A WEEK.        Follow-up Information     Colette Torrence GRADE, MD Follow up in 1 week(s).   Specialty: Family Medicine Contact information: [address] [place] [address] [phone]         [facility] Follow up.   Specialty: Home Health Services Why: [facility] health will provide home health services.  They will call you in the next 24-48 hours to set up services. Contact information:  [address] [place] [address] [phone]                  Time coordinating discharge: 39 minutes  Signed:  Delquan Poucher  [facility] 07/31/2023, 12:31 PM

## 2023-07-30 NOTE — Progress Notes (Signed)
 Nurse requested Mobility Specialist to perform oxygen saturation test with pt which includes removing pt from oxygen both at rest and while ambulating.  Below are the results from that testing.     Patient Saturations on Room Air at Rest = spO2 92%  Patient Saturations on Room Air while Ambulating = sp02 89-90% .    At end of testing pt left in room on RA.  Reported results to nurse.

## 2023-07-30 NOTE — Telephone Encounter (Signed)
 Patient scheduled an appointment for 08/01/2023 at 1:10 pm.

## 2023-07-31 ENCOUNTER — Other Ambulatory Visit: Payer: Self-pay | Admitting: Physician Assistant

## 2023-07-31 NOTE — Telephone Encounter (Signed)
 Please schedule

## 2023-08-01 ENCOUNTER — Ambulatory Visit (INDEPENDENT_AMBULATORY_CARE_PROVIDER_SITE_OTHER): Admitting: Family Medicine

## 2023-08-01 ENCOUNTER — Encounter: Payer: Self-pay | Admitting: Family Medicine

## 2023-08-01 VITALS — BP 152/79 | HR 80 | Temp 98.5°F | Resp 18 | Ht 64.0 in | Wt 253.4 lb

## 2023-08-01 DIAGNOSIS — J441 Chronic obstructive pulmonary disease with (acute) exacerbation: Secondary | ICD-10-CM | POA: Diagnosis not present

## 2023-08-01 DIAGNOSIS — Z09 Encounter for follow-up examination after completed treatment for conditions other than malignant neoplasm: Secondary | ICD-10-CM

## 2023-08-01 NOTE — Telephone Encounter (Signed)
 Spoke with [name] [name], answering service for [facility] and gave him the message to get to the correct person. [name] [name] states that he would pass the information on to the correct person.

## 2023-08-01 NOTE — Progress Notes (Signed)
 Established Patient Office Visit  Subjective   Patient ID: [name] [name], female    DOB: 05/04/1940  Age: 83 y.o. MRN: [id number]  Chief Complaint  Patient presents with   Hospitalization Follow-up    Patient is here for a hospital follow up, patient is also here to discuss home health orders    HPI  Hospital follow up  Pt was admitted to [facility] on 6/18 for SOB. She was diagnosed with COPD Exacerbation 2/2 parainfluenza virus. She was given prednisone  and Doxycycline  and discharged on 6/23. She is here today and feeling weak. Still with some cough. Using her Trelegy and Albuterol  inhaler prn. She has also been recommended to start home health PT. She had some electrolyte imbalance while in hospital. Here for repeat labs today. Daughter in law and granddaughter in room today with pt. Oxygen sat low initially upon first walking to triage area. Pt also reports she checks at home and it's been 94-95% while at rest. Her sats drop some when walking.    Review of Systems  Constitutional:  Positive for malaise/fatigue.  Respiratory:  Positive for cough.   All other systems reviewed and are negative.     Objective:     BP (!) 152/79   Pulse 80   Temp 98.5 F (36.9 C) (Oral)   Resp 18   Ht 5' 4 (1.626 m)   Wt 253 lb 6.4 oz (114.9 kg)   SpO2 (!) 88%   BMI 43.50 kg/m  BP Readings from Last 3 Encounters:  08/01/23 (!) 152/79  07/30/23 100/83  05/30/23 121/60      Physical Exam Vitals and nursing note reviewed.  Constitutional:      Appearance: Normal appearance. She is obese.  HENT:     Head: Normocephalic and atraumatic.     Right Ear: External ear normal.     Left Ear: External ear normal.     Nose: Nose normal.     Mouth/Throat:     Mouth: Mucous membranes are moist.     Pharynx: Oropharynx is clear.   Eyes:     Conjunctiva/sclera: Conjunctivae normal.     Pupils: Pupils are equal, round, and reactive to light.    Cardiovascular:     Rate and Rhythm:  Normal rate and regular rhythm.     Pulses: Normal pulses.     Heart sounds: Normal heart sounds.  Pulmonary:     Effort: Pulmonary effort is normal.     Breath sounds: Normal breath sounds.  Abdominal:     General: Bowel sounds are normal.   Skin:    General: Skin is warm.     Capillary Refill: Capillary refill takes less than 2 seconds.   Neurological:     General: No focal deficit present.     Mental Status: She is alert and oriented to person, place, and time. Mental status is at baseline.   Psychiatric:        Mood and Affect: Mood normal.        Behavior: Behavior normal.        Thought Content: Thought content normal.        Judgment: Judgment normal.      No results found for any visits on 08/01/23.    The ASCVD Risk score (Arnett DK, et al., 2019) failed to calculate for the following reasons:   The 2019 ASCVD risk score is only valid for ages 60 to 60    Assessment & Plan:  Problem List Items Addressed This Visit   None Hospital discharge follow-up  COPD with acute exacerbation (HCC) -     CBC with Differential/Platelet -     Basic metabolic panel with GFR -     Magnesium    Pt with recent hospital stay for COPD exacerbation. Continue routine treatment and advised pt to start doing Albuterol  TID for now while still congested. To start [facility] PT.   No follow-ups on file.    Torrence CINDERELLA Barrier, MD

## 2023-08-01 NOTE — Telephone Encounter (Signed)
 Pt seen today for hospital follow up. Please call HH agengy at number provided below to inform them I'll sign off on orders.

## 2023-08-02 ENCOUNTER — Ambulatory Visit: Payer: Self-pay | Admitting: Family Medicine

## 2023-08-02 LAB — CBC WITH DIFFERENTIAL/PLATELET
Basophils Absolute: 0.1 10*3/uL (ref 0.0–0.2)
Basos: 0 %
EOS (ABSOLUTE): 0.1 10*3/uL (ref 0.0–0.4)
Eos: 0 %
Hematocrit: 50.5 % — ABNORMAL HIGH (ref 34.0–46.6)
Hemoglobin: 15.3 g/dL (ref 11.1–15.9)
Immature Grans (Abs): 0.1 10*3/uL (ref 0.0–0.1)
Immature Granulocytes: 1 %
Lymphocytes Absolute: 0.9 10*3/uL (ref 0.7–3.1)
Lymphs: 6 %
MCH: 27.7 pg (ref 26.6–33.0)
MCHC: 30.3 g/dL — ABNORMAL LOW (ref 31.5–35.7)
MCV: 92 fL (ref 79–97)
Monocytes Absolute: 0.4 10*3/uL (ref 0.1–0.9)
Monocytes: 2 %
Neutrophils Absolute: 14.1 10*3/uL — ABNORMAL HIGH (ref 1.4–7.0)
Neutrophils: 91 %
Platelets: 301 10*3/uL (ref 150–450)
RBC: 5.52 x10E6/uL — ABNORMAL HIGH (ref 3.77–5.28)
RDW: 14.1 % (ref 11.7–15.4)
WBC: 15.5 10*3/uL — ABNORMAL HIGH (ref 3.4–10.8)

## 2023-08-02 LAB — BASIC METABOLIC PANEL WITH GFR
BUN/Creatinine Ratio: 24 (ref 12–28)
BUN: 32 mg/dL — ABNORMAL HIGH (ref 8–27)
CO2: 21 mmol/L (ref 20–29)
Calcium: 9.7 mg/dL (ref 8.7–10.3)
Chloride: 97 mmol/L (ref 96–106)
Creatinine, Ser: 1.33 mg/dL — ABNORMAL HIGH (ref 0.57–1.00)
Glucose: 179 mg/dL — ABNORMAL HIGH (ref 70–99)
Potassium: 4.6 mmol/L (ref 3.5–5.2)
Sodium: 138 mmol/L (ref 134–144)
eGFR: 40 mL/min/{1.73_m2} — ABNORMAL LOW (ref >59)

## 2023-08-02 LAB — MAGNESIUM: Magnesium: 1.9 mg/dL (ref 1.6–2.3)

## 2023-08-02 NOTE — Telephone Encounter (Signed)
 Sleep study scheduling

## 2023-08-03 ENCOUNTER — Telehealth: Payer: Self-pay | Admitting: Family Medicine

## 2023-08-03 NOTE — Telephone Encounter (Signed)
 Task completed. Spoke with Elberta regarding the verbal order for the occupational therapy. Routing msg to the provider as an fyi - O2 sat drop to 83 with exertion.

## 2023-08-03 NOTE — Telephone Encounter (Unsigned)
 Copied from CRM [id number]. Topic: Clinical - Home Health Verbal Orders >> Aug 03, 2023 12:20 PM Donee H wrote: Caller/Agency: [place] from [facility]  Callback Number: [phone] Service Requested: Physical Therapy Frequency: 1 time a week for 6 weeks  Any new concerns about the patient? No

## 2023-08-03 NOTE — Telephone Encounter (Signed)
 Copied from CRM [id number]. Topic: Clinical - Home Health Verbal Orders >> Aug 03, 2023  2:28 PM Carlatta H wrote: Caller/Agency: Elberta Rushing Number: [phone] Service Requested: Occupational Therapy Frequency: 1 time a week for 8 weeks Any new concerns about the patient? Yes O2 saturation drop to 83 with exertion

## 2023-08-03 NOTE — Telephone Encounter (Signed)
 Task completed. Contacted and left a vm msg for [place] at [facility] regarding the verbal order for the physical therapy. Direct call back info provided.

## 2023-08-06 NOTE — Telephone Encounter (Signed)
 Pt has COPD. Will need to continue to monitor saturations and report any below 88% with exertion. She also has pulmonologist f/u not until 7/16. May need to get supplemental oxygen if sats continue to drop.

## 2023-08-08 ENCOUNTER — Ambulatory Visit: Payer: Self-pay | Admitting: *Deleted

## 2023-08-08 DIAGNOSIS — E785 Hyperlipidemia, unspecified: Secondary | ICD-10-CM | POA: Insufficient documentation

## 2023-08-08 DIAGNOSIS — J9621 Acute and chronic respiratory failure with hypoxia: Secondary | ICD-10-CM | POA: Insufficient documentation

## 2023-08-08 DIAGNOSIS — E119 Type 2 diabetes mellitus without complications: Secondary | ICD-10-CM | POA: Insufficient documentation

## 2023-08-08 NOTE — Telephone Encounter (Signed)
 FYI Only or Action Required?: Action required by provider: clinical question for provider.  Patient was last seen in primary care on 08/01/2023 by Colette Torrence GRADE, MD. Called Nurse Triage reporting Breathing Problem. Symptoms began today. Interventions attempted: Prescription medications: inhaler. Symptoms are: gradually worsening.  Triage Disposition: Go to ED or PCP/Alternative with Approval  Patient/caregiver understands and will follow disposition?: No, wishes to speak with PCP                  Copied from CRM [id number]. Topic: Clinical - Red Word Triage >> Aug 08, 2023  3:11 PM Wess RAMAN wrote: Red Word that prompted transfer to Nurse Triage: Elberta (OT) called to report vitals. Oxygen level ranging from 81-91 and drops after movement. No difficulty breathing as of right now. Reason for Disposition  Patient sounds very sick or weak to the triager  Answer Assessment - Initial Assessment Questions 1. MAIN CONCERN OR SYMPTOM: What's your main concern? (e.g., low oxygen level, breathing difficulty) What question do you have?     Low oxygen levels ,SOB with cough and exertion per Elberta , OT 2.  OXYGEN EQUIPMENT:  Are you having trouble with your oxygen equipment?  (e.g., cannula, mask, tubing, tank, concentrator)     No equipment has been received since Hospital discharge 3. ONSET: When did the  sx  start?      Today  4. OXYGEN THERAPY:    - Do you currently use home oxygen? (e.g., yes, no). Not at this time   - If Yes, ask: What is your oxygen source? (e.g., O2 tank, O2 concentrator).    - If Yes, ask: How do you get the oxygen? (e.g., nasal prongs, face mask).    - If Yes, ask: How much oxygen are you supposed to use? (e.g., 1-2 L [place])     No, but does reports she is waiting on delivery and was told not available until August 5. PULSE OXIMETER:    - Do you have a pulse oximeter (pulse ox)?  (e.g., yes, no)    - If Yes, ask: Where do you place the probe?  (e.g., fingertip, ear lobe)     Yes patient using probe in finger 6. O2 MONITORING: What is the oxygen level (pulse ox reading)? (e.g., 70-100%)     88-89 % after use of inhaler but drops back to 85 % on RA. 7. VSS MONITORING: Do you monitor/measure your oxygen level or vital signs (e.g., yes, no, measurements are automatically sent to provider/call center). Document CURRENT and NORMAL BASELINE values if available.     -  P: What is your pulse rate per minute?   -  RR: What is your respiratory rate per minute?     Pulse 68-80  and RR WNL per Elberta OT 8. BREATHING DIFFICULTY: Are you having any difficulty breathing? If Yes, ask: How bad is it?  (e.g., none, mild, moderate, severe)   - MILD: No SOB at rest, mild SOB with walking, speaks normally in sentences, able to lie down, no retractions, pulse < 100.   - MODERATE: SOB at rest, SOB with minimal exertion and prefers to sit, cannot lie down flat, speaks in phrases, mild retractions, audible wheezing, pulse 100-120.   - SEVERE: Very SOB at rest, speaks in single words, struggling to breathe, sitting hunched forward, retractions, pulse > 120      SOB with exertion and coughing  9. OTHER SYMPTOMS: Do you have any other symptoms? (e.g., fever, change  in sputum)     Coughing non productive but congestion noted  10. SMOKING: Do you smoke currently? Is there anyone that smokes around you?  (Note: smoking around oxygen is dangerous!)       Na  Recent hospital discharge for COPD exacerbation. Last OV 08/01/23. Recommended ED due to low O2 sats. Denies chest pain at this time but noted when taking deep breaths. SOB with exertion. O2 sat ranging from 85%-89%. Patient does not want to go to ED and when OV offered reports she was just seen. Please advise if oxygen can be ordered or delivered before August per patient report. CAL notified patient does not want to go back to ED.  Protocols used: COPD Oxygen Monitoring and Hypoxia-A-AH

## 2023-08-09 ENCOUNTER — Telehealth: Payer: Self-pay

## 2023-08-09 ENCOUNTER — Telehealth: Payer: Self-pay | Admitting: Pulmonary Disease

## 2023-08-09 NOTE — Telephone Encounter (Signed)
 Called the patient regarding her symptoms. Per patient, she has been admitted to [facility] ED in [place]. Patient is aware to schedule a hospital follow up with provider as directed. Patient verbalized understanding and is agreeable with the plan care.

## 2023-08-09 NOTE — Telephone Encounter (Signed)
 Attempted to call patient. She is currently scheduled for July 16th but due to cpap compliance she needs an appointment between July 18th and September 15th. Left voicemail saying this.

## 2023-08-09 NOTE — Telephone Encounter (Signed)
 FYI:  I called the patient's home to let her know that [facility] scheduling has been trying to reach her to schedule her pre-op surgery. Her son stated that she is currently in the hospital again for respiratory issues. He said her shoulder surgery will need to be put on hold for now. I'm going to close the referrals for the CT and post op therapy for now --- can be reopened in the future as needed.

## 2023-08-13 NOTE — Telephone Encounter (Signed)
 Patient was admitted to [facility] ER on 08/08/23 for the following symptoms. Patient was notified to schedule a hospital follow up when discharged as directed. Patient verbalized understanding.

## 2023-08-15 DIAGNOSIS — R54 Age-related physical debility: Secondary | ICD-10-CM | POA: Insufficient documentation

## 2023-08-21 ENCOUNTER — Telehealth (HOSPITAL_BASED_OUTPATIENT_CLINIC_OR_DEPARTMENT_OTHER): Payer: Self-pay

## 2023-08-21 NOTE — Telephone Encounter (Unsigned)
 Copied from CRM [id number]. Topic: Appointments - Scheduling Inquiry for Clinic >> Aug 20, 2023 11:33 AM Leila C wrote: Reason for CRM: Patient's son Larnell [phone] states patient is in [facility] rehab in [place] and cannot make the appointment for 08/22/23 at 3:30 pm and will call back to reschedule. Appointment was cancelled.   Joe states unsure if patient needs to do the sleep study for 09/27/23 at 8 pm, patient already had a sleep study done. Joe states he's frustrated, has sent message through Mychart and no one respond. Please call back.

## 2023-08-21 NOTE — Telephone Encounter (Signed)
 Glad issues have resolved  Hopefully able to keep oxygen off during the day, oxygen levels may improve and stabilize once acute problems have resolved

## 2023-08-21 NOTE — Telephone Encounter (Signed)
 Per Son: Pt has qualified for CPAP with oxygen already in the hospital and does not need titration sleep study. [facility] was the one who qualified her for this at her admission to them. She is also working with rehab to come off supplemental oxygen during the day. When her son visited her this past weekend she was not requiring O2 at all during the day while he was there.

## 2023-08-21 NOTE — Telephone Encounter (Signed)
 This discussion has been going on for several messages  Patient needs a titration study with CPAP  Insurance will not pay for Auto CPAP with oxygen just added-we already tried this prescription-see documentation on 07/11/2023 - Order already went to DME company and insurance will not pay for it without a titration  Patient known to have sleep apnea, went for sleep study but did not sleep well, was placed on 4 L of oxygen because oxygen was very low   We can only move forward with a titration study, with patient titrated in the sleep lab to her CPAP and oxygen.  Separately, she needs oxygen supplementation during the day secondary to underlying lung disease  - Will need a qualifying walk to assess oxygen levels -If she is already on oxygen then we will continue the same

## 2023-08-21 NOTE — Telephone Encounter (Signed)
 Titration study was ordered by Dr. Neda due to oxygen needs, please direct question to him If they can not attend CPAP titration study than we can potentially order auto CPAP if she already has O2 set up at home

## 2023-08-22 ENCOUNTER — Encounter: Admitting: Pulmonary Disease

## 2023-08-22 ENCOUNTER — Ambulatory Visit: Admitting: Pulmonary Disease

## 2023-08-27 ENCOUNTER — Inpatient Hospital Stay: Admitting: Family Medicine

## 2023-09-05 ENCOUNTER — Inpatient Hospital Stay: Admitting: Family Medicine

## 2023-09-10 ENCOUNTER — Telehealth: Payer: Self-pay

## 2023-09-10 ENCOUNTER — Inpatient Hospital Stay: Admitting: Family Medicine

## 2023-09-10 NOTE — Telephone Encounter (Signed)
 Copied from CRM [id number]. Topic: General - Other >> Sep 10, 2023 10:24 AM Rosaria BRAVO wrote: Reason for CRM: Printed plan of care order fax incoming. Needs to be signed, dated, and faxed back  Best contact: [phone] Jon

## 2023-09-11 NOTE — Progress Notes (Signed)
 This encounter was created in error - please disregard.

## 2023-09-13 ENCOUNTER — Encounter: Payer: Self-pay | Admitting: Family Medicine

## 2023-09-13 ENCOUNTER — Ambulatory Visit: Admitting: Family Medicine

## 2023-09-13 VITALS — BP 174/64 | HR 77 | Temp 98.3°F | Resp 18 | Ht 64.0 in | Wt 255.3 lb

## 2023-09-13 DIAGNOSIS — Z09 Encounter for follow-up examination after completed treatment for conditions other than malignant neoplasm: Secondary | ICD-10-CM | POA: Diagnosis not present

## 2023-09-13 DIAGNOSIS — E1149 Type 2 diabetes mellitus with other diabetic neurological complication: Secondary | ICD-10-CM

## 2023-09-13 DIAGNOSIS — J441 Chronic obstructive pulmonary disease with (acute) exacerbation: Secondary | ICD-10-CM

## 2023-09-13 DIAGNOSIS — J9601 Acute respiratory failure with hypoxia: Secondary | ICD-10-CM

## 2023-09-13 DIAGNOSIS — Z7984 Long term (current) use of oral hypoglycemic drugs: Secondary | ICD-10-CM

## 2023-09-13 MED ORDER — ALBUTEROL SULFATE (2.5 MG/3ML) 0.083% IN NEBU: 2.5000 mg | INHALATION_SOLUTION | Freq: Four times a day (QID) | RESPIRATORY_TRACT | 1 refills | Status: DC | PRN

## 2023-09-13 MED ORDER — BENZONATATE 100 MG PO CAPS: 100.0000 mg | ORAL_CAPSULE | Freq: Two times a day (BID) | ORAL | 0 refills | Status: DC | PRN

## 2023-09-13 NOTE — Progress Notes (Signed)
 Established Patient Office Visit  Subjective   Patient ID: [name] [name], female    DOB: 1941-01-29  Age: 83 y.o. MRN: [id number]  Chief Complaint  Patient presents with   Hospitalization Follow-up    HPI  Hospitalization follow up She was admitted to the hospital on 6/18-6/23 for COPD exacerbation. She was discharged and was seen by PT. She noted increased hypoxia. She was sent back to the hospital and was admitted on 7/2-7/9 for acute respiratory failure. She was given steroids and nebulizer treatments during her hospital stay. She then was discharged to rehab facility from 7/9 -7/24. She now has home oxygen at 2 L at night and prn during the day. Pt is here today stating she still has a cough. Has tessalon  perles at home but son reports he hasn't been putting them in her medicine boxes. The son reports he only has 12 pills left of her tessalon  perles. Pt denies SOB. She does ask for rx for nebulizer to help with future exacerbations.  Pt asks about referral to ophthalmology for blurred vision in her left eye. She has hx of diabetes and reports she's never had DM eye exam.    Review of Systems  Eyes:  Positive for blurred vision.  Respiratory:  Positive for cough.   All other systems reviewed and are negative.     Objective:     BP (!) 174/64   Pulse 77   Temp 98.3 F (36.8 C) (Oral)   Resp 18   Ht 5' 4 (1.626 m)   Wt 255 lb 4.8 oz (115.8 kg)   SpO2 94%   BMI 43.82 kg/m  BP Readings from Last 3 Encounters:  09/13/23 (!) 174/64  08/01/23 (!) 152/79  07/30/23 100/83      Physical Exam Vitals and nursing note reviewed.  Constitutional:      Appearance: Normal appearance. She is obese.  HENT:     Head: Normocephalic and atraumatic.     Right Ear: Tympanic membrane, ear canal and external ear normal.     Left Ear: Tympanic membrane, ear canal and external ear normal.     Nose: Nose normal.     Mouth/Throat:     Mouth: Mucous membranes are moist.      Pharynx: Oropharynx is clear.  Eyes:     Conjunctiva/sclera: Conjunctivae normal.     Pupils: Pupils are equal, round, and reactive to light.  Cardiovascular:     Rate and Rhythm: Normal rate and regular rhythm.     Pulses: Normal pulses.     Heart sounds: Normal heart sounds.  Pulmonary:     Effort: Pulmonary effort is normal.     Breath sounds: Normal breath sounds.  Skin:    General: Skin is warm.     Capillary Refill: Capillary refill takes less than 2 seconds.  Neurological:     General: No focal deficit present.     Mental Status: She is alert and oriented to person, place, and time. Mental status is at baseline.     Comments: Slow gait with rollator walk  Psychiatric:        Mood and Affect: Mood normal.        Behavior: Behavior normal.        Thought Content: Thought content normal.        Judgment: Judgment normal.      No results found for any visits on 09/13/23.  Last hemoglobin A1c Lab Results  Component Value Date  HGBA1C 6.4 (H) 05/02/2023      The ASCVD Risk score (Arnett DK, et al., 2019) failed to calculate for the following reasons:   The 2019 ASCVD risk score is only valid for ages 33 to 24    Assessment & Plan:   Problem List Items Addressed This Visit       Respiratory   Acute hypoxic respiratory failure (HCC)   Relevant Medications   benzonatate  (TESSALON ) 100 MG capsule   albuterol  (PROVENTIL ) (2.5 MG/3ML) 0.083% nebulizer solution   Other Relevant Orders   For home use only DME Nebulizer machine   COPD with acute exacerbation (HCC)   Relevant Medications   benzonatate  (TESSALON ) 100 MG capsule   albuterol  (PROVENTIL ) (2.5 MG/3ML) 0.083% nebulizer solution   Other Relevant Orders   For home use only DME Nebulizer machine   Other Visit Diagnoses       Hospital discharge follow-up    -  Primary     Type 2 diabetes mellitus with neurological complications (HCC)       Relevant Orders   Ambulatory referral to Ophthalmology       Hospital discharge follow-up  Acute hypoxic respiratory failure (HCC) -     For home use only DME Nebulizer machine -     Benzonatate ; Take 1 capsule (100 mg total) by mouth 2 (two) times daily as needed for cough.  Dispense: 20 capsule; Refill: 0 -     Albuterol  Sulfate; Take 3 mLs (2.5 mg total) by nebulization every 6 (six) hours as needed for wheezing or shortness of breath.  Dispense: 150 mL; Refill: 1  COPD with acute exacerbation (HCC) -     For home use only DME Nebulizer machine -     Benzonatate ; Take 1 capsule (100 mg total) by mouth 2 (two) times daily as needed for cough.  Dispense: 20 capsule; Refill: 0 -     Albuterol  Sulfate; Take 3 mLs (2.5 mg total) by nebulization every 6 (six) hours as needed for wheezing or shortness of breath.  Dispense: 150 mL; Refill: 1  Type 2 diabetes mellitus with neurological complications (HCC) -     Ambulatory referral to Ophthalmology   Pt with hx of COPD with recent hospitalization and rehab stay for acute respiratory failure. On oxygen 2 liters at bedtime and prn.  Rx for nebulizer sent for added usage during exacerbations Sent referral for DM eye exam  No follow-ups on file.    Torrence CINDERELLA Barrier, MD

## 2023-09-14 ENCOUNTER — Other Ambulatory Visit

## 2023-09-17 ENCOUNTER — Telehealth: Payer: Self-pay

## 2023-09-17 ENCOUNTER — Telehealth: Payer: Self-pay | Admitting: Family Medicine

## 2023-09-17 NOTE — Telephone Encounter (Signed)
 Nebulizer orders  and clinical notes uploaded to [facility] to be sent electronically to [facility].

## 2023-09-17 NOTE — Telephone Encounter (Signed)
 Copied from CRM [id number]. Topic: General - Other >> Sep 17, 2023  4:45 PM Jasmin G wrote: Reason for CRM: Mrs. Ronal from [facility] called to report an incident where pt fell on her bottom while in the shower, pt is denying pain medication as she states she feels well. Mrs. Ronal also wants to request OT services for, please call back at [phone].

## 2023-09-17 NOTE — Telephone Encounter (Signed)
 Please advise on referral for OT.  See message below.

## 2023-09-17 NOTE — Telephone Encounter (Signed)
 Prior auth for: PROVENTIL  Determination: PENDING Auth #: [id number] Valid from: N/A

## 2023-09-18 ENCOUNTER — Ambulatory Visit (INDEPENDENT_AMBULATORY_CARE_PROVIDER_SITE_OTHER)

## 2023-09-18 ENCOUNTER — Telehealth: Payer: Self-pay

## 2023-09-18 DIAGNOSIS — R918 Other nonspecific abnormal finding of lung field: Secondary | ICD-10-CM | POA: Diagnosis not present

## 2023-09-18 NOTE — Telephone Encounter (Signed)
 Prior auth for: PROVENTIL  Determination: DENIED Auth #: [id number] Reason:

## 2023-09-18 NOTE — Telephone Encounter (Signed)
 Copied from CRM [id number]. Topic: Clinical - Order For Equipment >> Sep 18, 2023  8:18 AM Marda MATSU wrote: [name] [name] with [facility] calling to let you know that the patients insurance is out of network and so we can not fill the request for a Nebulizer and albuterol .  Please advise.

## 2023-09-19 ENCOUNTER — Encounter (HOSPITAL_BASED_OUTPATIENT_CLINIC_OR_DEPARTMENT_OTHER): Admitting: Pulmonary Disease

## 2023-09-19 NOTE — Telephone Encounter (Signed)
 Re-sent orders to [facility]. Awaiting confirmation via parachute.

## 2023-09-20 NOTE — Telephone Encounter (Signed)
 Order accepted by [facility] specialities per Reena Lance. [facility] will contact patient to schedule delivery.   Patient updated on orders and aware that [facility] medical will contact her to schedule an appointment for delivery.

## 2023-09-20 NOTE — Telephone Encounter (Signed)
 Prior auth for: PROVENTIL  Determination: DENIED Auth #: [id number] Reason:

## 2023-09-23 ENCOUNTER — Other Ambulatory Visit (HOSPITAL_BASED_OUTPATIENT_CLINIC_OR_DEPARTMENT_OTHER): Payer: Self-pay | Admitting: Family Medicine

## 2023-09-23 ENCOUNTER — Encounter (HOSPITAL_BASED_OUTPATIENT_CLINIC_OR_DEPARTMENT_OTHER): Admitting: Pulmonary Disease

## 2023-09-23 DIAGNOSIS — K219 Gastro-esophageal reflux disease without esophagitis: Secondary | ICD-10-CM

## 2023-09-25 ENCOUNTER — Ambulatory Visit: Admitting: Obstetrics and Gynecology

## 2023-09-26 ENCOUNTER — Other Ambulatory Visit: Payer: Self-pay | Admitting: Family Medicine

## 2023-09-27 ENCOUNTER — Ambulatory Visit (HOSPITAL_BASED_OUTPATIENT_CLINIC_OR_DEPARTMENT_OTHER): Attending: Pulmonary Disease | Admitting: Pulmonary Disease

## 2023-09-27 VITALS — Ht 65.0 in | Wt 250.0 lb

## 2023-09-27 DIAGNOSIS — R0902 Hypoxemia: Secondary | ICD-10-CM | POA: Diagnosis not present

## 2023-09-27 DIAGNOSIS — Z6841 Body Mass Index (BMI) 40.0 and over, adult: Secondary | ICD-10-CM | POA: Diagnosis present

## 2023-09-27 DIAGNOSIS — G4733 Obstructive sleep apnea (adult) (pediatric): Secondary | ICD-10-CM | POA: Diagnosis not present

## 2023-09-28 ENCOUNTER — Other Ambulatory Visit (HOSPITAL_BASED_OUTPATIENT_CLINIC_OR_DEPARTMENT_OTHER): Payer: Self-pay | Admitting: Family Medicine

## 2023-09-28 DIAGNOSIS — D649 Anemia, unspecified: Secondary | ICD-10-CM

## 2023-10-03 ENCOUNTER — Ambulatory Visit: Admitting: Family Medicine

## 2023-10-04 ENCOUNTER — Ambulatory Visit: Admitting: Obstetrics and Gynecology

## 2023-10-05 NOTE — Progress Notes (Shared)
 [facility] - Clinic Note  10/10/2023   CHIEF COMPLAINT Patient presents for No chief complaint on file.  HISTORY OF PRESENT ILLNESS: [name] [name] is a 83 y.o. female who presents to the clinic today for:   Referring physician: Colette Torrence GRADE, MD [address] [place],  [place] [address]  HISTORICAL INFORMATION:  Selected notes from the MEDICAL RECORD NUMBER Referred by Dr. JAMA:  Ocular Hx- PMH-   CURRENT MEDICATIONS: No current outpatient medications on file. (Ophthalmic Drugs)   No current facility-administered medications for this visit. (Ophthalmic Drugs)   Current Outpatient Medications (Other)  Medication Sig   albuterol  (PROVENTIL ) (2.5 MG/3ML) 0.083% nebulizer solution Take 3 mLs (2.5 mg total) by nebulization every 6 (six) hours as needed for wheezing or shortness of breath.   albuterol  (VENTOLIN  HFA) 108 (90 Base) MCG/ACT inhaler INHALE 1-2 PUFFS INTO THE LUNGS EVERY 4 HOURS AS NEEDED FOR WHEEZING OR SHORTNESS OF BREATH.   ALPRAZolam  (XANAX ) 0.25 MG tablet TAKE (1) TABLET BY MOUTH TWICE DAILY AT 11AM & 5PM FOR ANXIETY. (Patient taking differently: Take 0.25 mg by mouth 3 (three) times daily as needed for anxiety.)   AMBULATORY NON FORMULARY MEDICATION One four wheel rolling walker with seat and hand brakes. Brand/style covered by insurance. Medically necessary for ICD-10: R26.81, Z74.1   amLODIPine -Valsartan -HCTZ 10-320-25 MG TABS Take 1 tablet by mouth daily.   ARTHRITIS PAIN RELIEVING 0.075 % topical cream Apply 1 application topically 3 (three) times daily as needed (neuropathic pain in feet).   atorvastatin  (LIPITOR) 10 MG tablet TAKE 1 TABLET BY MOUTH EVERY DAY   benzonatate  (TESSALON ) 100 MG capsule Take 1 capsule (100 mg total) by mouth 2 (two) times daily as needed for cough.   blood glucose meter kit and supplies KIT Meter, test strips, lancets, and alcohol swabs. Use for testing blood sugar every morning before meals and up to 3  additional times per day as needed. Brand based on patient and insurance coverage. 100 strips with 99 refills, 100 lancets with 99 refills, 200 alcohol swabs with 99 refills. Dx: E11.65   camphor-menthol (SARNA) lotion Apply 1 application topically as needed for itching. (Patient taking differently: Apply 1 application  topically 2 (two) times daily as needed for itching.)   carvedilol  (COREG ) 6.25 MG tablet TAKE 1 TABLET BY MOUTH 2 TIMES DAILY WITH A MEAL.   cetirizine (ZYRTEC) 10 MG tablet TAKE 1 TABLET BY MOUTH EVERY DAY   cholestyramine  (QUESTRAN ) 4 g packet TAKE 1 PACKET (4 G TOTAL) BY MOUTH 2 TIMES DAILY.   EASYMAX TEST test strip    FARXIGA  10 MG TABS tablet TAKE 1 TABLET BY MOUTH DAILY BEFORE BREAKFAST.   ferrous sulfate  325 (65 FE) MG tablet TAKE 1 TABLET BY MOUTH EVERY DAY   Fluticasone -Umeclidin-Vilant (TRELEGY ELLIPTA ) 100-62.5-25 MCG/ACT AEPB INHALE 1 PUFF BY MOUTH INTO THE LUNGS DAILY. ** RINSE MOUTH AFTER USE**   gabapentin  (NEURONTIN ) 300 MG capsule Take 300 mg by mouth daily.   guaiFENesin  (ROBITUSSIN) 100 MG/5ML liquid Take 5 mLs by mouth every 4 (four) hours as needed for cough or to loosen phlegm.   hydrALAZINE  (APRESOLINE ) 25 MG tablet TAKE 1 TABLET BY MOUTH TWICE A DAY   lidocaine  (LIDODERM ) 5 % Place 1 patch onto the skin daily. Remove & Discard patch within 12 hours or as directed by MD   methenamine  (HIPREX ) 1 g tablet Take 1 tablet (1 g total) by mouth 2 (two) times daily with a meal.  mirabegron  ER (MYRBETRIQ ) 25 MG TB24 tablet TAKE 1 TABLET (25 MG TOTAL) BY MOUTH DAILY.   mirtazapine  (REMERON ) 7.5 MG tablet Take 7.5 mg by mouth at bedtime.   nystatin  ointment (MYCOSTATIN ) Apply 1 Application topically 2 (two) times daily.   pantoprazole  (PROTONIX ) 40 MG tablet Take 1 tablet (40 mg total) by mouth daily for 14 days.   PARoxetine  (PAXIL ) 40 MG tablet Take 40 mg by mouth daily.   YUVAFEM  10 MCG TABS vaginal tablet PLACE 1 TABLET VAGINALLY TWICE A WEEK.   No current  facility-administered medications for this visit. (Other)   REVIEW OF SYSTEMS:  ALLERGIES Allergies  Allergen Reactions   Azithromycin  Anaphylaxis and Hives   Bee Venom Anaphylaxis   Erythromycin Base Anaphylaxis   Metronidazole Anaphylaxis   Nitrofurantoin Macrocrystal Anaphylaxis   Penicillins Anaphylaxis and Hives    childhood   Sulfa Antibiotics Anaphylaxis and Hives    Childhood   Ceftriaxone  Sodium In Dextrose  Rash    Patient report bad rash.    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   Cataract    Closed fracture of upper end of humerus 11/03/2017   COPD (chronic obstructive pulmonary disease) (HCC)    Depression    Diabetes 1.5, managed as type 2 (HCC)    Encounter to establish care 06/03/2020   Essential hypertension 05/19/2019   Female stress incontinence 02/14/2018   GERD (gastroesophageal reflux disease)    History of opioid abuse (HCC) 05/19/2019   Hyperglycemia 05/19/2019   Hyperlipidemia    Hypertension    Menopause present 02/11/2018   Need for shingles vaccine 07/01/2020   Osteoporosis    Sleep apnea    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   Past Surgical History:  Procedure Laterality Date   ABDOMINAL HYSTERECTOMY  1980   BREAST EXCISIONAL BIOPSY     1990's   CHOLECYSTECTOMY  1980   MELANOMA EXCISION Right    Great Toe   REPLACEMENT TOTAL KNEE Bilateral 2000   sacral  neuromodulation     FAMILY HISTORY Family History  Problem Relation Age of Onset   Cancer Mother    Intellectual disability Mother    Cancer Father    Early death Father    Hypertension Father    Breast cancer Sister    Cancer Sister    Arthritis Maternal Grandmother    Asthma Paternal Grandmother    Asthma Paternal Grandfather    Colon cancer Neg Hx    Stomach cancer Neg Hx    Esophageal cancer Neg Hx    Colon polyps Neg Hx    SOCIAL HISTORY Social History   Tobacco Use   Smoking status: Former    Current packs/day: 0.00    Types: Cigarettes    Start date: 1957    Quit date: 1988    Years since quitting: 37.6  Smokeless tobacco: Never  Vaping Use   Vaping status: Never Used  Substance Use Topics   Alcohol use: Not Currently   Drug use: Never       OPHTHALMIC EXAM:  Not recorded    IMAGING AND PROCEDURES  Imaging and Procedures for 10/10/2023        ASSESSMENT/PLAN: No diagnosis found. 1.  2.  3.  Ophthalmic Meds Ordered this visit:  No orders of the defined types were placed in this encounter.    No follow-ups on file.  There are no Patient Instructions on file for this visit.  Explained the diagnoses, plan, and follow up with the patient and they expressed understanding.  Patient expressed understanding of the importance of proper follow up care.   This document serves as a record of services personally performed by Redell JUDITHANN Hans, MD, PhD. It was created on their behalf by Almetta Pesa, an ophthalmic technician. The creation of this record is the provider's dictation and/or activities during the visit.    Electronically signed by: Almetta Pesa, OA, 10/05/23  11:49 AM   Redell JUDITHANN Hans, M.D., Ph.D. Diseases & Surgery of the Retina and Vitreous [facility] 10/10/2023  Abbreviations: M myopia (nearsighted); A astigmatism; H hyperopia (farsighted); P presbyopia; Mrx spectacle prescription;  CTL contact lenses;  OD right eye; OS left eye; OU both eyes  XT exotropia; ET esotropia; PEK punctate epithelial keratitis; PEE punctate epithelial erosions; DES dry eye syndrome; MGD meibomian gland dysfunction; ATs artificial tears; PFAT's preservative free artificial tears; NSC nuclear sclerotic cataract; PSC posterior subcapsular cataract; ERM epi-retinal membrane; PVD posterior vitreous detachment; RD retinal detachment; DM diabetes mellitus; DR diabetic retinopathy; NPDR non-proliferative diabetic retinopathy; PDR proliferative diabetic retinopathy; CSME clinically significant macular edema; DME diabetic macular edema; dbh dot blot hemorrhages; CWS cotton wool spot; POAG primary open angle glaucoma; C/D cup-to-disc ratio; HVF humphrey visual field; GVF goldmann visual field; OCT optical coherence tomography; IOP intraocular pressure; BRVO Branch retinal vein occlusion; CRVO central retinal vein occlusion; CRAO central retinal artery occlusion; BRAO branch retinal artery occlusion; RT retinal tear; SB scleral buckle; PPV pars plana vitrectomy; VH Vitreous hemorrhage; PRP panretinal laser photocoagulation; IVK intravitreal kenalog ; VMT vitreomacular traction; MH Macular hole;  NVD neovascularization of the disc; NVE neovascularization elsewhere; AREDS age related eye disease study; ARMD age related macular degeneration; POAG primary open angle glaucoma; EBMD epithelial/anterior basement membrane dystrophy; ACIOL anterior chamber intraocular lens; IOL intraocular lens; PCIOL posterior chamber intraocular lens; Phaco/IOL phacoemulsification with intraocular lens placement; PRK photorefractive keratectomy; LASIK laser assisted in situ keratomileusis; HTN hypertension; DM diabetes mellitus; COPD chronic obstructive pulmonary disease

## 2023-10-08 ENCOUNTER — Encounter: Payer: Self-pay | Admitting: Family Medicine

## 2023-10-09 ENCOUNTER — Ambulatory Visit (INDEPENDENT_AMBULATORY_CARE_PROVIDER_SITE_OTHER): Admitting: Family Medicine

## 2023-10-09 ENCOUNTER — Encounter: Payer: Self-pay | Admitting: Family Medicine

## 2023-10-09 ENCOUNTER — Ambulatory Visit: Payer: Self-pay

## 2023-10-09 VITALS — BP 149/70 | HR 90 | Temp 98.6°F | Resp 18 | Ht 65.0 in | Wt 254.4 lb

## 2023-10-09 DIAGNOSIS — Z23 Encounter for immunization: Secondary | ICD-10-CM

## 2023-10-09 DIAGNOSIS — L03115 Cellulitis of right lower limb: Secondary | ICD-10-CM | POA: Diagnosis not present

## 2023-10-09 MED ORDER — DOXYCYCLINE HYCLATE 100 MG PO TABS: 100.0000 mg | ORAL_TABLET | Freq: Two times a day (BID) | ORAL | 0 refills | Status: AC

## 2023-10-09 NOTE — Progress Notes (Signed)
 Acute Office Visit  Subjective:     Patient ID: [name] [name], female    DOB: 12-Apr-1940, 83 y.o.   MRN: [id number]  Chief Complaint  Patient presents with   Cellulitis    HPI Patient is in today for acute visit.  Pt reports 2 day hx of right leg swelling 2 days before going to urgent care on 8/26. She had ultrasound done and was negative for DVT. She was given Keflex  which she's had in the past but this time, she reported throat itching. She called them on the phone and was changed to Doxycycline  100 mg BID x 7 days.  She reports her leg isn't as red as it was.   Pt would like her flu vaccine today.  Review of Systems  Skin:        Right leg redness  All other systems reviewed and are negative.       Objective:    BP (!) 149/70   Pulse 90   Temp 98.6 F (37 C) (Oral)   Resp 18   Ht 5' 5 (1.651 m)   Wt 254 lb 6.4 oz (115.4 kg)   SpO2 90% Comment: Patient is on 2 L of O2  BMI 42.33 kg/m  BP Readings from Last 3 Encounters:  10/09/23 (!) 149/70  09/13/23 (!) 174/64  08/01/23 (!) 152/79      Physical Exam Vitals and nursing note reviewed.  Constitutional:      Appearance: Normal appearance. She is obese.  HENT:     Head: Normocephalic and atraumatic.     Right Ear: External ear normal.     Left Ear: External ear normal.     Nose: Nose normal.     Mouth/Throat:     Mouth: Mucous membranes are moist.     Pharynx: Oropharynx is clear.  Eyes:     Conjunctiva/sclera: Conjunctivae normal.     Pupils: Pupils are equal, round, and reactive to light.  Cardiovascular:     Rate and Rhythm: Normal rate.  Pulmonary:     Effort: Pulmonary effort is normal.  Musculoskeletal:     Right lower leg: Edema present.     Comments: Right lower leg with edema and erythema from ankle to mid tib/fib  Skin:    General: Skin is warm.     Capillary Refill: Capillary refill takes less than 2 seconds.  Neurological:     General: No focal deficit present.     Mental  Status: She is alert and oriented to person, place, and time. Mental status is at baseline.  Psychiatric:        Mood and Affect: Mood normal.        Behavior: Behavior normal.        Thought Content: Thought content normal.        Judgment: Judgment normal.     No results found for any visits on 10/09/23.      Assessment & Plan:   Problem List Items Addressed This Visit   None Visit Diagnoses       Cellulitis of right lower extremity    -  Primary   Relevant Medications   doxycycline  (VIBRA -TABS) 100 MG tablet     Need for influenza vaccination       Relevant Orders   Flu vaccine HIGH DOSE PF(Fluzone Trivalent) (Completed)      Cellulitis of right lower extremity -     Doxycycline  Hyclate; Take 1 tablet (100 mg total) by mouth 2 (  two) times daily for 7 days.  Dispense: 14 tablet; Refill: 0  Need for influenza vaccination -     Flu vaccine HIGH DOSE PF(Fluzone Trivalent)   Pt with cellulitis. Slowly improving. Unfortunately, pt is allergic to all first line treatments for cellulitis. To extend Doxycycline  for 7 more days, a total of 14 days.  Flu vaccine today  Meds ordered this encounter  Medications   doxycycline  (VIBRA -TABS) 100 MG tablet    Sig: Take 1 tablet (100 mg total) by mouth 2 (two) times daily for 7 days.    Dispense:  14 tablet    Refill:  0    No follow-ups on file.  Torrence CINDERELLA Barrier, MD

## 2023-10-09 NOTE — Telephone Encounter (Signed)
 Appointment made for today 10/09/2023 at 11:10 AM with with patient's PCP Dr Torrence Barrier  FYI Only or Action Required?: FYI only for provider.  Patient was last seen in primary care on 09/13/2023 by Barrier Torrence GRADE, MD.  Called Nurse Triage reporting Leg Pain/Swelling.  Symptoms began about a week ago.  Interventions attempted: Other: Urgent Care visit 5 days ago.  Symptoms are: slightly better but not completely.  Triage Disposition: See HCP Within 4 Hours (Or PCP Triage)  Patient/caregiver understands and will follow disposition?: Yes      Urgent Care Thursday Cellulitis Prescription Given--allergic reaction--hives and scratching Cephalexin                   Copied from CRM #[id number]. Topic: Clinical - Red Word Triage >> Oct 09, 2023  8:06 AM Rosaria BRAVO wrote: Red Word that prompted transfer to Nurse Triage: Pain and swelling in right leg. Possible cellulitis.    ----------------------------------------------------------------------- From previous Reason for Contact - Scheduling: Patient/patient representative is calling to schedule an appointment. Refer to attachments for appointment information. Reason for Disposition  [1] Thigh or calf pain AND [2] only 1 side AND [3] present > 1 hour  (Exception: Chronic unchanged pain.)  Answer Assessment - Initial Assessment Questions Son called for patient This RN also called patient to verify no changes or additional symptoms Patient went to Urgent Care 5 days ago, given an antibiotic, had an allergic reaction per son, given a smaller dose of same antibiotic per son and patient states that the cellulitis is a little better but not a lot ([facility] at [facility] diagnosed her with cellulitis in this right lower leg) Patient and her son are advised that if anything worsens to go to the Emergency Room. Patient and her son verbalized understanding.    1. ONSET: When did the pain start?      About a week 2.  LOCATION: Where is the pain located?      Shin/calf of right leg 3. PAIN: How bad is the pain?    (Scale 1-10; or mild, moderate, severe)     Off and on pain   redness noted 4. WORK OR EXERCISE: Has there been any recent work or exercise that involved this part of the body?      Daily use 5. CAUSE: What do you think is causing the leg pain?     Cellulitis 6. OTHER SYMPTOMS: Do you have any other symptoms? (e.g., chest pain, back pain, breathing difficulty, swelling, rash, fever, numbness, weakness)     Son denies knowing of any other symptoms  Protocols used: Leg Pain-A-AH

## 2023-10-10 ENCOUNTER — Encounter: Payer: Self-pay | Admitting: Family Medicine

## 2023-10-10 ENCOUNTER — Encounter (INDEPENDENT_AMBULATORY_CARE_PROVIDER_SITE_OTHER): Admitting: Ophthalmology

## 2023-10-10 ENCOUNTER — Telehealth: Payer: Self-pay | Admitting: Pulmonary Disease

## 2023-10-10 DIAGNOSIS — H3581 Retinal edema: Secondary | ICD-10-CM

## 2023-10-10 DIAGNOSIS — G4733 Obstructive sleep apnea (adult) (pediatric): Secondary | ICD-10-CM

## 2023-10-10 MED ORDER — ATORVASTATIN CALCIUM 10 MG PO TABS: 10.0000 mg | ORAL_TABLET | Freq: Every day | ORAL | 1 refills | Status: AC

## 2023-10-10 NOTE — Addendum Note (Signed)
 Addended by: COLETTE TORRENCE GRADE on: 10/10/2023 01:11 PM   Modules accepted: Orders

## 2023-10-10 NOTE — Telephone Encounter (Signed)
 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

## 2023-10-10 NOTE — Procedures (Signed)
 [facility] [address] [place], [place] [address] Tel: [phone]   Fax: [phone]  Titration Interpretation  Patient Name:  [name], [name] Study Date:  09/27/2023 Referring Physician:  JENNET EPLEY, MD %%startinterp%% Indications for Polysomnography The patient is a 82 year-old Female who is 5' 5 and weighs 250.0 lbs. Her BMI equals 41.7.  A full night titration treatment study was performed.  Medication  No Data.   Polysomnogram Data A full night polysomnogram recorded the standard physiologic parameters including EEG, EOG, EMG, EKG, nasal and oral airflow.  Respiratory parameters of chest and abdominal movements were recorded with Respiratory Inductance Plethysmography belts.  Oxygen saturation was recorded by pulse oximetry.   Sleep Architecture The total recording time of the polysomnogram was 401.5 minutes.  The total sleep time was 210.5 minutes.  The patient spent 10.9% of total sleep time in Stage N1, 89.1% in Stage N2, 0.0% in Stages N3, and 0.0% in REM.  Sleep latency was 63.8 minutes.  REM latency was - minutes.  Sleep Efficiency was 52.4%.  Wake after Sleep Onset time was 127.0 minutes.  Titration Summary The patient was titrated at pressures ranging from 5* cm/H20 with supplemental oxygen at - up to 13/9/0** cm/H20 with supplemental oxygen at O2: 4.00.  The last pressure used in the study was 13/9/0** cm/H20 with supplemental oxygen at O2: 4.00.  Respiratory Events The polysomnogram revealed a presence of - obstructive, - central, and - mixed apneas resulting in an Apnea index of - events per hour.  There were 29 hypopneas (>=3% desaturation and/or arousal) resulting in an Apnea\Hypopnea Index (AHI >=3% desaturation and/or arousal) of 8.3 events per hour.  There were 18 hypopneas (>=4% desaturation) resulting in an Apnea\Hypopnea Index (AHI >=4% desaturation) of 5.1 events per hour.  There were 1 Respiratory Effort Related  Arousals resulting in a RERA index of 0.3 events per hour. The Respiratory Disturbance Index is 8.6 events per hour.  The snore index was - events per hour.  Mean oxygen saturation was 87.8%.  The lowest oxygen saturation during sleep was 79.0%.  Time spent <=88% oxygen saturation was 290.9 minutes (72.6%).  Limb Activity There were 24 limb movements recorded.  Of this total, 6 were classified as PLMs.  Of the PLMs, 6 were associated with arousals.  The Limb Movement index was 6.8 per hour while the PLM index was 1.7 per hour.  Cardiac Summary The average pulse rate was 70.9 bpm.  The minimum pulse rate was 48.0 bpm while the maximum pulse rate was 94.0 bpm.  Cardiac rhythm was normal/abnormal.  Comments:  Diagnosis:  Mild obstructive sleep apnea titrated to BiPAP Final BiPAP pressure of 13/9 with 4 L of oxygen piped into system for persistent hypoxemia Patient used a medium size ResMed AirFit F30i fullface mask Study may have been limited by sleep onset and sleep maintenance insomnia, decreased sleep efficiency, REM sleep was not achieved No significant periodic limb movement PACs noted throughout the recording  Recommendations: Recommend BiPAP 13/9 cm H2O with EPR of 2, Oxygen 4 L bled into system  Medium sized red ResMed AirFit F30i fullface mask  Evaluation and management of noted insomnia  Encourage weight loss measures  Close clinical follow-up for optimization of treatment    This study was personally reviewed and electronically signed by: JENNET EPLEY, MD Accredited Board Certified in Sleep Medicine Date/Time: 10/10/23    %%endinterp%%  Titration Report  Patient Name: [name] [name], [name] [name] Study Date: 09/27/2023  Date  of Birth: [birth year] Study Type: CPAP Titration  Age: 42 year MRN #: [id number]  Sex: Female Interpreting Physician: NEDA HAMMOND, [id number]  Height: 5' 5 Referring Physician: HAMMOND NEDA, MD  Weight: 250.0 lbs Recording Tech: Firman Anon  RPSGT RST  BMI: 41.7 Scoring Tech: Firman Anon RPSGT RST  ESS: 8 Neck Size: 17  Mask Type RESMED AIRFIT F30i Final Pressure: 13/9  Mask Size: SMALL Supplemental O2: 4   Study Overview  Lights Off: 10:07:58 PM  Count Index  Lights On: 04:49:29 AM Awakenings: 37 10.5  Time in Bed: 401.5 min. Arousals: 90 25.7  Total Sleep Time: 210.5 min. AHI (>=3% Desat and/or Ar.): 29 8.3   Sleep Efficiency: 52.4% AHI (>=4% Desat): 18 5.1   Sleep Latency: 63.8 min. Limb Movements: 24 6.8  Wake After Sleep Onset: 127.0 min. Snore: - -  REM Latency from Sleep Onset: - min. Desaturations: 55 15.7     Minimum SpO2 TST: 79.0%    Sleep Architecture  % of Time in Bed Stages Time (mins) % Sleep Time  Wake 191.5   Stage N1 23.0 10.9%  Stage N2 187.5 89.1%  Stage N3 0.0 0.0%  REM 0.0 0.0%   Arousal Summary   NREM REM Sleep Index  Respiratory Arousals 13 - 13 3.7  PLM Arousals 6 - 6 1.7  Isolated Limb Movement Arousals 14 - 14 4.0  Snore Arousals - - - -  Spontaneous Arousals 57 - 57 16.2  Total 90 - 90 25.7   Limb Movement Summary   Count Index  Isolated Limb Movements 18 5.1  Periodic Limb Movements (PLMs) 6 1.7  Total Limb Movements 24 6.8    Respiratory Summary   By Sleep Stage By Body Position Total   NREM REM Supine Non-Supine   Time (min) 210.5 0.0 210.5 - 210.5         Obstructive Apnea - - - - -  Mixed Apnea - - - - -  Central Apnea - - - - -  Total Apneas - - - - -  Total Apnea Index - - - - -         Hypopneas (>=3% Desat and/or Ar.) 29 - 29 - 29  AHI (>=3% Desat and/or Ar.) 8.3 - 8.3 - 8.3         Hypopneas (>=4% Desat) 18 - 18 - 18  AHI (>=4% Desat) 5.1 - 5.1 - 5.1          RERAs 1 - 1 - 1  RERA Index 0.3 - 0.3 - 0.3         RDI 8.6 - 8.6 - 8.6     Respiratory Event Durations   Apnea Hypopnea   NREM REM NREM REM  Average (seconds) - - 23.1 -  Maximum (seconds) - - 34.8 -    Oxygen Saturation Summary   Wake NREM REM TST TIB  Average SpO2 88.2% 87.5%  - 87.5% 87.8%  Minimum SpO2 79.0% 79.0% - 79.0% 79.0%  Maximum SpO2 94.0% 93.0% - 93.0% 94.0%   Oxygen Saturation Distribution  Range (%) Time in range (min) Time in range (%)  90.0 - 100.0 16.8 4.2%  80.0 - 90.0 383.8 95.7%  70.0 - 80.0 0.4 0.1%  60.0 - 70.0 - -  50.0 - 60.0 - -  0.0 - 50.0 - -  Time Spent <=88% SpO2  Range (%) Time in range (min) Time in range (%)  0.0 - 88.0 290.9 72.6%  Count Index  Desaturations 55 15.7    Cardiac Summary   Wake NREM REM Sleep Total  Average Pulse Rate (BPM) 72.5 69.4 - 69.4 70.9  Minimum Pulse Rate (BPM) 60.0 48.0 - 48.0 48.0  Maximum Pulse Rate (BPM) 94.0 83.0 - 83.0 94.0   Pulse Rate Distribution:  Range (bpm) Time in range (min) Time in range (%)  0.0 - 40.0 - -  40.0 - 60.0 0.3 0.1%  60.0 - 80.0 398.7 99.2%  80.0 - 100.0 1.8 0.5%  100.0 - 120.0 - -  120.0 - 140.0 - -  140.0 - 200.0 - -   Titration Summary  PAP Device PAP Level O2 Level Time (min) Wake (min) NREM (min) REM (min) Sleep Eff% OA# CA# MA# Hyp# (>=3%) AHI (>=3%) Hyp# (>=4%) AHI (>=%4) RERA RDI SpO2 <=88% (min) Min SpO2 Mean SpO2 Ar. Index  CPAP 5 - 31.0 31.0 0.0 0.0 0.0%               CPAP 5 O2: 1.00 17.5 17.5 0.0 0.0 0.0%               CPAP 5 O2: 2.00 34.5 16.0 18.5 0.0 53.6% - - - 2 6.5 1  3.2 1  9.7  16.3 85.0 87.8 16.2  CPAP 6 O2: 2.00 43.5 27.5 16.0 0.0 36.8% - - - 2 7.5 -  - -  7.5  12.7 85.0 87.9 26.3  CPAP 7 O2: 2.00 5.0 0.0 5.0 0.0 100.0% - - - - - -  - -  -  5.0 87.0 87.6 -  CPAP 7 O2: 3.00 25.5 1.5 24.0 0.0 94.1% - - - 2 5.0 2  5.0 -  5.0  18.0 79.0 88.0 20.0  CPAP 8 O2: 3.00 25.5 6.5 19.0 0.0 74.5% - - - 8 25.3 5  15.8 -  25.3  14.0 83.0 87.9 25.3  CPAP 10 O2: 3.00 23.5 0.5 23.0 0.0 97.9% - - - 6 15.7 5  13.0 -  15.7  20.4 81.0 87.5 15.7  CPAP 11 O2: 3.00 31.0 1.5 29.5 0.0 95.2% - - - 1 2.0 -  - -  2.0  29.5 79.0 86.6 30.5  CPAP 11 O2: 4.00 19.5 3.5 16.0 0.0 82.1% - - - 2 7.5 2  7.5 -  7.5  15.4 83.0 86.5 37.5  BiLevel 13/9/0 O2: 4.00 145.5  86.0 59.5 0.0 40.9% - - - 6 6.1 3  3.0 -  6.1  47.7 84.0 87.8 31.3    Hypnograms                           Technologist Comments  THE 25-YEAR-OLD FEMALE PATIENT PRESENTED TO THE SLEEP DISORDER CENTER FOR A CPAP TITRATION STUDY WITH A CHIEF COMPLAINT OF OSA. THE PATIENT WAS FITTED WITH A MEDIUM RESMED AIRFIT F30i FULL FACE MASK FOR PRE-CPAP TRIAL, WHICH WAS TOLERATED WELL. NO BEDTIME MEDICATIONS WERE SELF ADMINISTERED. THE LEAD PLACEMENT WAS INITIATED, THEN THE STUDY WAS BEGUN. THE CPAP TRIAL WAS INITIATED USING THE ABOVE MASK TYPE ON A PRESSURE OF 5cmH2O AND WAS SWITCHED TO BIPAP AND WAS TITRATED TO 13/9cmH2O WITH AN EPR OF 2, OXYGEN BLEED IN OF 4 LPM (FOR CONTINUOUS DESATURATIONS) AND HEATED HUMIDIFICATION. THE CPAP PRESSURE WAS INCREASED FOR RESPIRATORY EVENTS AND RERAs UNTIL OPTIMAL PRESSURE WAS ACHIEVED. PLMs - PLMAs WERE NOTED. SUPPLEMENTAL OXYGEN WAS BLEED IN THROUGH THE CPAP TUBING AT 4 LPM THROUGHOUT THE STUDY. NO RESTROOM VISITS WERE  MADE.  QUESTIONABLE CARDIAC ARRHYTHMIAS WERE OBSERVED THROUGHOUT THE ENTIRE STUDY. NO OBVIOUS PARASOMNIAs WERE OBSERVED. THE PATIENT HAD A HARD TIME INITIATING AND MAINTAINING SLEEP IN THE SECOND HALF OF THE NIGHT.   THE PATIENT WILL NEED SOMEONE TO STAY TO ASSIST HER WITH ADL'S IF SHE RETURNS. THE PATIENT WAS NOTED BEING A MOUTHBREATHER. THE PATIENT TOLERATED THE CPAP TRIAL WELL.

## 2023-10-10 NOTE — Telephone Encounter (Signed)
 Called the pt and there was no answer- LMTCB

## 2023-10-11 ENCOUNTER — Telehealth: Payer: Self-pay

## 2023-10-11 ENCOUNTER — Encounter: Payer: Self-pay | Admitting: Cardiology

## 2023-10-11 DIAGNOSIS — G4733 Obstructive sleep apnea (adult) (pediatric): Secondary | ICD-10-CM

## 2023-10-11 NOTE — Telephone Encounter (Signed)
 Copied from CRM [id number]. Topic: Clinical - Lab/Test Results >> Oct 11, 2023 11:06 AM Russell PARAS wrote: Reason for CRM:   Pt returning call from [facility] regarding her sleep study results. Contacted [facility], no answer.  Pt requests call back  CB#  [phone] Called and spoke to patient relayed message placed order [facility]

## 2023-10-11 NOTE — Telephone Encounter (Signed)
This has been taken care of . Nothing else further needed.

## 2023-10-17 ENCOUNTER — Telehealth: Payer: Self-pay | Admitting: *Deleted

## 2023-10-17 NOTE — Telephone Encounter (Signed)
 Patient had more questions about Bipap Explained she was place on Bipap during last sleep study. Informed order had been placed with [employer] and after insurance approval she will be contacted. Patient verbalized understanding.

## 2023-10-23 ENCOUNTER — Ambulatory Visit (INDEPENDENT_AMBULATORY_CARE_PROVIDER_SITE_OTHER): Admitting: Obstetrics and Gynecology

## 2023-10-23 ENCOUNTER — Other Ambulatory Visit (HOSPITAL_COMMUNITY)
Admission: RE | Admit: 2023-10-23 | Discharge: 2023-10-23 | Disposition: A | Discharge status: Home / Self Care | Source: Ambulatory Visit | Attending: Obstetrics and Gynecology | Admitting: Obstetrics and Gynecology

## 2023-10-23 ENCOUNTER — Encounter: Payer: Self-pay | Admitting: Obstetrics and Gynecology

## 2023-10-23 VITALS — BP 113/54 | HR 72

## 2023-10-23 DIAGNOSIS — R300 Dysuria: Secondary | ICD-10-CM

## 2023-10-23 DIAGNOSIS — Z87440 Personal history of urinary (tract) infections: Secondary | ICD-10-CM | POA: Diagnosis not present

## 2023-10-23 DIAGNOSIS — N898 Other specified noninflammatory disorders of vagina: Secondary | ICD-10-CM | POA: Insufficient documentation

## 2023-10-23 DIAGNOSIS — N3281 Overactive bladder: Secondary | ICD-10-CM

## 2023-10-23 DIAGNOSIS — N390 Urinary tract infection, site not specified: Secondary | ICD-10-CM

## 2023-10-23 DIAGNOSIS — N952 Postmenopausal atrophic vaginitis: Secondary | ICD-10-CM

## 2023-10-23 LAB — POCT URINALYSIS DIP (CLINITEK)
Bilirubin, UA: NEGATIVE
Glucose, UA: 500 mg/dL — AB
Ketones, POC UA: NEGATIVE mg/dL
Nitrite, UA: NEGATIVE
Spec Grav, UA: 1.015 (ref 1.010–1.025)
Urobilinogen, UA: 0.2 U/dL
pH, UA: 5.5 (ref 5.0–8.0)

## 2023-10-23 MED ORDER — ESTRADIOL 0.1 MG/GM VA CREA: 0.5000 g | TOPICAL_CREAM | VAGINAL | 11 refills | Status: DC

## 2023-10-23 MED ORDER — MIRABEGRON ER 50 MG PO TB24: 50.0000 mg | ORAL_TABLET | Freq: Every day | ORAL | 5 refills | Status: AC

## 2023-10-23 NOTE — Patient Instructions (Addendum)
 Increase Myrbetriq  to 50mg  daily. You can double 25mg  Myrbetriq  until you run out.   I have sent in the estrogen cream to the pharmacy if you think this will be easier to use. You can use it nightly for 2 weeks and then twice a week after.   Continue the Hiprex  x2 daily to try and prevent the UTIs.

## 2023-10-23 NOTE — Progress Notes (Signed)
 [facility] Return Visit  SUBJECTIVE  History of Present Illness: [name] [name] is a 83 y.o. female seen in follow-up for rUTI. Plan at last visit was use Methenamine  1g x2 daily, use Yuvafem  twice a week, and Myrbetriq  25mg  for OAB.  Patient reports she is having a lot of urgency/frequency when she changes positions. Sometimes has a full loss of urine.   Patient reports she has frequent burning in the vagina and feelings of a UTI.   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   Past Surgical History: She  has a past surgical history that includes Abdominal hysterectomy (1980); Melanoma excision (Right); Cholecystectomy (1980); Replacement total knee (Bilateral, 2000); sacral neuromodulation; and Breast excisional biopsy.   Medications: She has a current medication list which includes the following prescription(s): albuterol , albuterol , alprazolam , AMBULATORY NON FORMULARY MEDICATION, amlodipine -valsartan -hctz,  arthritis pain relieving, atorvastatin , benzonatate , blood glucose meter kit and supplies, sarna, carvedilol , cetirizine, cholestyramine , easymax test, [START ON 10/25/2023] estradiol , farxiga , ferrous sulfate , trelegy ellipta , gabapentin , guaifenesin , hydralazine , lidocaine , methenamine , mirabegron  er, mirtazapine , nystatin  ointment, pantoprazole , paroxetine , and yuvafem .   Allergies: Patient is allergic to azithromycin , bee venom, erythromycin base, metronidazole, nitrofurantoin macrocrystal, penicillins, sulfa antibiotics, ceftriaxone  sodium in dextrose , cortisone, cymbalta [duloxetine hcl], ivp dye [iodinated contrast media], keflex  [cephalexin ], penicillin g, codeine, erythromycin, fosfomycin, levofloxacin, nitrofurantoin, and percocet [oxycodone-acetaminophen ].   Social History: Patient  reports that she quit smoking about 37 years ago. Her smoking use included cigarettes. She started smoking about 68 years ago. She has never used smokeless tobacco. She reports that she does not currently use alcohol. She reports that she does not use drugs.     OBJECTIVE     Physical Exam: Vitals:   10/23/23 1504  BP: (!) 113/54  Pulse: 72   Gen: No apparent distress, A&O x 3.  Yeast noted under panis  Detailed Urogynecologic Evaluation:  Vaginal tissues bright red and irritated looking with noted areas of irritation on innternal vulva. Friable tissues on exam and with swab. Aptima obtained.   Straight cath preformed for urine sample  Lab Results  Component Value Date   COLORU yellow 10/23/2023   CLARITYU clear 10/23/2023   GLUCOSEUR =500 (A) 10/23/2023   BILIRUBINUR negative 10/23/2023   KETONESU Negative 07/26/2021   SPECGRAV 1.015 10/23/2023   RBCUR trace-intact (A) 10/23/2023   PHUR 5.5 10/23/2023   PROTEINUR NEGATIVE 07/27/2023   UROBILINOGEN 0.2 10/23/2023   LEUKOCYTESUR Trace (A) 10/23/2023       ASSESSMENT AND PLAN    Ms. Bloch is a 83 y.o. with:  1. Recurrent urinary  tract infection  2. Dysuria   3. Overactive bladder   4. Vaginal atrophy   5. Vaginal discharge    Cath sample had sediment in it. Will send urine for culture with pathnostics to rule out underlying infection of Gardnerella or yeast in the bladder.  Encouraged patient to continue hiprex , increased Myrbetriq  to 50mg  daily to see if this helps her OAB symptoms with change in position.  Prescribed estrogen cream as she thinks this may be easier than the yuvafem  she is currently trying to use.  Aptima swab obtained will send for culture.   Patient to follow up in 2 months or sooner if needed.    Jack Mineau G Corena Tilson, NP

## 2023-10-24 ENCOUNTER — Ambulatory Visit: Payer: Self-pay | Admitting: Obstetrics and Gynecology

## 2023-10-24 DIAGNOSIS — B3731 Acute candidiasis of vulva and vagina: Secondary | ICD-10-CM

## 2023-10-24 LAB — CERVICOVAGINAL ANCILLARY ONLY
Bacterial Vaginitis (gardnerella): NEGATIVE
Candida Glabrata: POSITIVE — AB
Candida Vaginitis: POSITIVE — AB
Comment: NEGATIVE
Comment: NEGATIVE
Comment: NEGATIVE

## 2023-10-24 MED ORDER — FLUCONAZOLE 150 MG PO TABS: 150.0000 mg | ORAL_TABLET | Freq: Every day | ORAL | 0 refills | Status: DC

## 2023-10-25 NOTE — Progress Notes (Signed)
 [facility] - Clinic Note  11/06/2023   CHIEF COMPLAINT Patient presents for Retina Evaluation  HISTORY OF PRESENT ILLNESS: [name] [name] is a 83 y.o. female who presents to the clinic today for:  HPI     Retina Evaluation   In both eyes.  This started 3 months ago.  Duration of 3 months.  Associated Symptoms Glare.  Context:  distance vision, mid-range vision and near vision.  I, the attending physician,  performed the HPI with the patient and updated documentation appropriately.        Comments   Retina eval per Dr Colette for Diabetic eye exam pt had eye exam about 3 months ago with Dr Rudy in [place] she updated her RX pt has a spot that is fuzzy in the left eye for the past 6 months and wanted to get 2nd opinion pt last blood sugar check was 115 A1C unknown       Last edited by Valdemar Rogue, MD on 11/06/2023  6:15 PM.    Pt here for 2nd opinion for fuzzy spot in [place] OS. Pt saw a regular eye doc in [place] just to get glasses but will not be returning. Pt reports the fuzzy spot is on the left side in the corner of OS, very tiny. Pt does not take any eye vitamins.    Referring physician: Colette Torrence GRADE, MD [address] [place],  [place] [address]  HISTORICAL INFORMATION:  Selected notes from the MEDICAL RECORD NUMBER Referred by Dr. JAMA:  Ocular Hx- PMH-   CURRENT MEDICATIONS: No current outpatient medications on file. (Ophthalmic Drugs)   No current facility-administered medications for this visit. (Ophthalmic Drugs)   Current Outpatient Medications (Other)  Medication Sig   albuterol  (PROVENTIL ) (2.5 MG/3ML) 0.083% nebulizer solution Take 3 mLs (2.5 mg total) by nebulization every 6 (six) hours as needed for wheezing or shortness of breath.   albuterol  (VENTOLIN  HFA) 108 (90 Base) MCG/ACT inhaler INHALE 1-2 PUFFS INTO THE LUNGS EVERY 4 HOURS AS NEEDED FOR WHEEZING OR SHORTNESS OF BREATH.   ALPRAZolam  (XANAX ) 0.25 MG tablet TAKE  (1) TABLET BY MOUTH TWICE DAILY AT 11AM & 5PM FOR ANXIETY. (Patient taking differently: Take 0.25 mg by mouth 3 (three) times daily as needed for anxiety.)   AMBULATORY NON FORMULARY MEDICATION One four wheel rolling walker with seat and hand brakes. Brand/style covered by insurance. Medically necessary for ICD-10: R26.81, Z74.1   amLODIPine -Valsartan -HCTZ 10-320-25 MG TABS Take 1 tablet by mouth daily.   ARTHRITIS PAIN RELIEVING 0.075 % topical cream Apply 1 application topically 3 (three) times daily as needed (neuropathic pain in feet).   atorvastatin  (LIPITOR) 10 MG tablet Take 1 tablet (10 mg total) by mouth daily.   benzonatate  (TESSALON ) 100 MG capsule Take 1 capsule (100 mg total) by mouth 2 (two) times daily as needed for cough.   blood glucose meter kit and supplies KIT Meter, test strips, lancets, and alcohol swabs. Use for testing blood sugar every morning before meals and up to 3 additional times per day as needed. Brand based on patient and insurance coverage. 100 strips with 99 refills, 100 lancets with 99 refills, 200 alcohol swabs with 99 refills. Dx: E11.65   camphor-menthol (SARNA) lotion Apply 1 application topically as needed for itching. (Patient taking differently: Apply 1 application  topically 2 (two) times daily as needed for itching.)   carvedilol  (COREG ) 6.25 MG tablet TAKE 1 TABLET BY MOUTH 2 TIMES DAILY WITH A MEAL.  cetirizine (ZYRTEC) 10 MG tablet TAKE 1 TABLET BY MOUTH EVERY DAY   cholestyramine  (QUESTRAN ) 4 g packet TAKE 1 PACKET (4 G TOTAL) BY MOUTH 2 TIMES DAILY.   EASYMAX TEST test strip    estradiol  (ESTRACE ) 0.1 MG/GM vaginal cream Place 0.5 g vaginally 2 (two) times a week. Place 0.5g nightly for two weeks then twice a week after   FARXIGA  10 MG TABS tablet TAKE 1 TABLET BY MOUTH DAILY BEFORE BREAKFAST.   ferrous sulfate  325 (65 FE) MG tablet TAKE 1 TABLET BY MOUTH EVERY DAY   fluconazole  (DIFLUCAN ) 150 MG tablet Take 1 tablet (150 mg total) by mouth daily.    Fluticasone -Umeclidin-Vilant (TRELEGY ELLIPTA ) 100-62.5-25 MCG/ACT AEPB INHALE 1 PUFF BY MOUTH INTO THE LUNGS DAILY. ** RINSE MOUTH AFTER USE**   gabapentin  (NEURONTIN ) 300 MG capsule Take 300 mg by mouth daily.   guaiFENesin  (ROBITUSSIN) 100 MG/5ML liquid Take 5 mLs by mouth every 4 (four) hours as needed for cough or to loosen phlegm.   hydrALAZINE  (APRESOLINE ) 25 MG tablet TAKE 1 TABLET BY MOUTH TWICE A DAY   lidocaine  (LIDODERM ) 5 % Place 1 patch onto the skin daily. Remove & Discard patch within 12 hours or as directed by MD   methenamine  (HIPREX ) 1 g tablet Take 1 tablet (1 g total) by mouth 2 (two) times daily with a meal.   mirabegron  ER (MYRBETRIQ ) 50 MG TB24 tablet Take 1 tablet (50 mg total) by mouth daily.   mirtazapine  (REMERON ) 7.5 MG tablet Take 7.5 mg by mouth at bedtime.   nystatin  ointment (MYCOSTATIN ) Apply 1 Application topically 2 (two) times daily.   pantoprazole  (PROTONIX ) 40 MG tablet Take 1 tablet (40 mg total) by mouth daily for 14 days.   PARoxetine  (PAXIL ) 40 MG tablet Take 40 mg by mouth daily.   YUVAFEM  10 MCG TABS vaginal tablet PLACE 1 TABLET VAGINALLY TWICE A WEEK.   No current facility-administered medications for this visit. (Other)   REVIEW OF SYSTEMS: ROS   Positive for: Endocrine, Eyes Last edited by Resa Delon ORN, COT on 11/06/2023  2:11 PM.     ALLERGIES Allergies  Allergen Reactions   Azithromycin  Anaphylaxis and Hives   Bee Venom Anaphylaxis   Erythromycin Base Anaphylaxis   Metronidazole Anaphylaxis   Nitrofurantoin Macrocrystal Anaphylaxis   Penicillins Anaphylaxis and Hives    childhood   Sulfa Antibiotics Anaphylaxis and Hives    Childhood   Ceftriaxone  Sodium In Dextrose  Rash    Patient report bad rash.    Cortisone     Other Reaction(s): Not available   Cymbalta [Duloxetine Hcl]    Ivp Dye [Iodinated Contrast Media]    Keflex  [Cephalexin ] Itching   Penicillin G Hives   Codeine Rash   Erythromycin Rash and Hives    Fosfomycin Rash   Levofloxacin Rash   Nitrofurantoin Hives   Percocet [Oxycodone-Acetaminophen ] Rash   PAST MEDICAL HISTORY 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   Cataract    Closed fracture of upper end of humerus 11/03/2017   COPD (chronic obstructive pulmonary disease) (HCC)    Depression    Diabetes 1.5, managed as type 2 (HCC)    Encounter to establish care  06/03/2020   Essential hypertension 05/19/2019   Female stress incontinence 02/14/2018   GERD (gastroesophageal reflux disease)    History of opioid abuse (HCC) 05/19/2019   Hyperglycemia 05/19/2019   Hyperlipidemia    Hypertension    Menopause present 02/11/2018   Need for shingles vaccine 07/01/2020   Osteoporosis    Sleep apnea    Final BiPAP pressure of 13/9 with 4 L of oxygen piped into system for persistent hypoxemia Patient used a medium size ResMed AirFit F30i fullface mask   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   Past Surgical History:  Procedure Laterality Date   ABDOMINAL HYSTERECTOMY  1980   BREAST EXCISIONAL BIOPSY     1990's   CHOLECYSTECTOMY  1980   MELANOMA EXCISION Right    Great Toe   REPLACEMENT TOTAL KNEE Bilateral 2000   sacral neuromodulation     FAMILY HISTORY Family History  Problem Relation Age of Onset   Cancer Mother    Intellectual disability Mother    Cancer Father    Early death Father    Hypertension Father    Breast cancer Sister    Cancer Sister    Arthritis Maternal Grandmother    Asthma Paternal Grandmother    Asthma Paternal Grandfather    Colon cancer Neg Hx    Stomach cancer Neg Hx    Esophageal cancer Neg Hx    Colon polyps Neg Hx    SOCIAL HISTORY Social History   Tobacco Use   Smoking  status: Former    Current packs/day: 0.00    Types: Cigarettes    Start date: 74    Quit date: 1988    Years since quitting: 37.7   Smokeless tobacco: Never  Vaping Use   Vaping status: Never Used  Substance Use Topics   Alcohol use: Not Currently   Drug use: Never       OPHTHALMIC EXAM:  Base Eye Exam     Visual Acuity (Snellen - Linear)       Right Left   Dist cc 20/25 -2 20/25 -1   Dist ph cc NI NI  Mono VA OD distance OS near         Tonometry (Tonopen, 2:20 PM)       Right Left   Pressure 13 14         Pupils       Pupils Dark Light Shape React APD   Right PERRL 4 3 Round Brisk None   Left PERRL 4 3 Round Brisk None         Visual Fields       Left Right    Full Full         Extraocular Movement       Right Left    Full, Ortho Full, Ortho         Neuro/Psych     Oriented x3: Yes   Mood/Affect: Normal         Dilation     Both eyes: 2.5% Phenylephrine @ 2:20 PM           Slit Lamp and Fundus Exam     External Exam       Right Left   External Normal Normal         Slit Lamp Exam       Right Left   Lids/Lashes Dermatochalasis Dermatochalasis   Conjunctiva/Sclera White and quiet White and quiet   Cornea Arcus, 1+ central PEE, well healed cataract wound  Mild arcus, trace PEE, well healed cataract wound   Anterior Chamber Deep and clear Deep and clear   Iris Round and dilated, no NVI, Iris atrophy Round and dilated, no NVI   Lens PC IOL in good position PC IOL in good position   Anterior Vitreous Vitreous syneresis Vitreous syneresis         Fundus Exam       Right Left   Disc Pink and Sharp Pink and Sharp, focal PPP   C/D Ratio 0.3 0.5   Macula Flat, good foveal reflex, RPE mottling, no heme or edema Flat, good foveal reflex, mild drusen nasal mac, no heme or edema   Vessels Attenuated, Tortuous Attenuated, mild tortuosity   Periphery Attached, no heme Attached, no heme           Refraction      Wearing Rx       Sphere Cylinder Axis Add   Right -0.25 +0.75 012 +2.50   Left -2.75 +1.00 048 +2.50           IMAGING AND PROCEDURES  Imaging and Procedures for 11/06/2023  OCT, Retina - OU - Both Eyes       Right Eye Quality was good. Central Foveal Thickness: 249. Progression has no prior data. Findings include normal foveal contour, no IRF, no SRF.   Left Eye Quality was good. Central Foveal Thickness: 244. Progression has no prior data. Findings include normal foveal contour, no IRF, no SRF, retinal drusen (Focal drusen w/ punctate ellipsoid disruption nasal mac).   Notes *Images captured and stored on drive  Diagnosis / Impression:  NFP, no IRF/SRF OU No DME OU OS: Focal drusen w/ punctate ellipsoid disruption nasal mac  Clinical management:  See below  Abbreviations: NFP - Normal foveal profile. CME - cystoid macular edema. PED - pigment epithelial detachment. IRF - intraretinal fluid. SRF - subretinal fluid. EZ - ellipsoid zone. ERM - epiretinal membrane. ORA - outer retinal atrophy. ORT - outer retinal tubulation. SRHM - subretinal hyper-reflective material. IRHM - intraretinal hyper-reflective material           ASSESSMENT/PLAN:   ICD-10-CM   1. Visual disturbance  H53.9     2. 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 Diabetes mellitus type 2 without retinopathy (HCC)  E11.9     4. Diabetes mellitus treated with oral medication (HCC)  E11.9    Z79.84     5. Essential hypertension  I10     6. Hypertensive retinopathy of both eyes  H35.033 OCT, Retina - OU - Both Eyes    7. Pseudophakia of both eyes  Z96.1      Visual disturbance OS - Pt's chief complaint is  small, fuzzy spot she's noticed in temporal paracentral visual field OS.  - OCT OS shows focal drusen w/ ellipsoid disruption nasal mac that could be etiology of symptoms - BCVA OS 20/25 - pt minimally bothered by  this spot in vision but just wanted to be checked out - no retinal or ophthalmic interventions indicated or recommended -- monitor - f/u 4-6 months -- DFE/OCT  2. Retinal Drusen/Early ARMD OS  - exam and OCT shows focal punctate druse nasal mac OS; no fluid  - The incidence, anatomy, and pathology of dry AMD, risk of progression, and the AREDS and AREDS 2 study including smoking risks discussed with patient.  - Recommend amsler grid monitoring  -  f/u 4-6 months -- DFE/OCT   3,4. Diabetes mellitus, type 2 without retinopathy  - A1C was 6.3 on 7.2.25  - Pt reports being diabetic x 2 years, on Farxiga  and Metformin  - The incidence, risk factors for progression, natural history and treatment options for diabetic retinopathy  were discussed with patient.   - The need for close monitoring of blood glucose, blood pressure, and serum lipids, avoiding cigarette or any type of tobacco, and the need for long term follow up was also discussed with patient. - f/u as above, sooner prn   5,6. Hypertensive retinopathy OU - discussed importance of tight BP control - monitor   7. Pseudophakia OU  - s/p CE/IOL  - IOL in good position, doing well  - monitor   Ophthalmic Meds Ordered this visit:  No orders of the defined types were placed in this encounter.    Return for 4-6 mos - retinal drusen / early ARMD OS, Dilated Exam, OCT.  There are no Patient Instructions on file for this visit.  Explained the diagnoses, plan, and follow up with the patient and they expressed understanding.  Patient expressed understanding of the importance of proper follow up care.   This document serves as a record of services personally performed by Redell JUDITHANN Hans, MD, PhD. It was created on their behalf by Wanda GEANNIE Keens, COT an ophthalmic technician. The creation of this record is the provider's dictation and/or activities during the visit.    Electronically signed by:  Wanda GEANNIE Keens, COT  11/06/23 6:19  PM  This document serves as a record of services personally performed by Redell JUDITHANN Hans, MD, PhD. It was created on their behalf by Almetta Pesa, an ophthalmic technician. The creation of this record is the provider's dictation and/or activities during the visit.    Electronically signed by: Almetta Pesa, OA, 11/06/23  6:19 PM  Redell JUDITHANN Hans, M.D., Ph.D. Diseases & Surgery of the Retina and Vitreous [facility] 11/06/2023  I have reviewed the above documentation for accuracy and completeness, and I agree with the above. Redell JUDITHANN Hans, M.D., Ph.D. 11/06/23 6:24 PM   Abbreviations: M myopia (nearsighted); A astigmatism; H hyperopia (farsighted); P presbyopia; Mrx spectacle prescription;  CTL contact lenses; OD right eye; OS left eye; OU both eyes  XT exotropia; ET esotropia; PEK punctate epithelial keratitis; PEE punctate epithelial erosions; DES dry eye syndrome; MGD meibomian gland dysfunction; ATs artificial tears; PFAT's preservative free artificial tears; NSC nuclear sclerotic cataract; PSC posterior subcapsular cataract; ERM epi-retinal membrane; PVD posterior vitreous detachment; RD retinal detachment; DM diabetes mellitus; DR diabetic retinopathy; NPDR non-proliferative diabetic retinopathy; PDR proliferative diabetic retinopathy; CSME clinically significant macular edema; DME diabetic macular edema; dbh dot blot hemorrhages; CWS cotton wool spot; POAG primary open angle glaucoma; C/D cup-to-disc ratio; HVF humphrey visual field; GVF goldmann visual field; OCT optical coherence tomography; IOP intraocular pressure; BRVO Branch retinal vein occlusion; CRVO central retinal vein occlusion; CRAO central retinal artery occlusion; BRAO branch retinal artery occlusion; RT retinal tear; SB scleral buckle; PPV pars plana vitrectomy; VH Vitreous hemorrhage; PRP panretinal laser photocoagulation; IVK intravitreal kenalog ; VMT vitreomacular traction; MH Macular hole;  NVD  neovascularization of the disc; NVE neovascularization elsewhere; AREDS age related eye disease study; ARMD age related macular degeneration; POAG primary open angle glaucoma; EBMD epithelial/anterior basement membrane dystrophy; ACIOL anterior chamber intraocular lens; IOL intraocular lens; PCIOL posterior chamber intraocular lens; Phaco/IOL phacoemulsification with intraocular lens placement; PRK photorefractive keratectomy; LASIK laser  assisted in situ keratomileusis; HTN hypertension; DM diabetes mellitus; COPD chronic obstructive pulmonary disease

## 2023-11-01 ENCOUNTER — Ambulatory Visit (HOSPITAL_BASED_OUTPATIENT_CLINIC_OR_DEPARTMENT_OTHER)

## 2023-11-06 ENCOUNTER — Ambulatory Visit (INDEPENDENT_AMBULATORY_CARE_PROVIDER_SITE_OTHER): Admitting: Ophthalmology

## 2023-11-06 ENCOUNTER — Encounter (INDEPENDENT_AMBULATORY_CARE_PROVIDER_SITE_OTHER): Payer: Self-pay | Admitting: Ophthalmology

## 2023-11-06 DIAGNOSIS — I10 Essential (primary) hypertension: Secondary | ICD-10-CM | POA: Diagnosis not present

## 2023-11-06 DIAGNOSIS — H353121 Nonexudative age-related macular degeneration, left eye, early dry stage: Secondary | ICD-10-CM

## 2023-11-06 DIAGNOSIS — Z7984 Long term (current) use of oral hypoglycemic drugs: Secondary | ICD-10-CM

## 2023-11-06 DIAGNOSIS — H35033 Hypertensive retinopathy, bilateral: Secondary | ICD-10-CM | POA: Diagnosis not present

## 2023-11-06 DIAGNOSIS — E119 Type 2 diabetes mellitus without complications: Secondary | ICD-10-CM

## 2023-11-06 DIAGNOSIS — Z961 Presence of intraocular lens: Secondary | ICD-10-CM

## 2023-11-06 DIAGNOSIS — H3581 Retinal edema: Secondary | ICD-10-CM

## 2023-11-06 DIAGNOSIS — H539 Unspecified visual disturbance: Secondary | ICD-10-CM | POA: Diagnosis not present

## 2023-11-15 ENCOUNTER — Ambulatory Visit (HOSPITAL_BASED_OUTPATIENT_CLINIC_OR_DEPARTMENT_OTHER)
Admission: RE | Admit: 2023-11-15 | Discharge: 2023-11-15 | Disposition: A | Discharge status: Home / Self Care | Source: Ambulatory Visit | Attending: Orthopaedic Surgery | Admitting: Orthopaedic Surgery

## 2023-11-15 DIAGNOSIS — M19012 Primary osteoarthritis, left shoulder: Secondary | ICD-10-CM | POA: Diagnosis present

## 2023-11-17 ENCOUNTER — Other Ambulatory Visit: Payer: Self-pay | Admitting: Acute Care

## 2023-11-17 ENCOUNTER — Other Ambulatory Visit: Payer: Self-pay | Admitting: Family Medicine

## 2023-11-17 DIAGNOSIS — J441 Chronic obstructive pulmonary disease with (acute) exacerbation: Secondary | ICD-10-CM

## 2023-11-17 DIAGNOSIS — J9601 Acute respiratory failure with hypoxia: Secondary | ICD-10-CM

## 2023-11-20 ENCOUNTER — Other Ambulatory Visit: Payer: Self-pay | Admitting: Obstetrics and Gynecology

## 2023-11-20 DIAGNOSIS — B3749 Other urogenital candidiasis: Secondary | ICD-10-CM

## 2023-11-20 LAB — PATHNOSTICS MOLECULAR TEST

## 2023-11-20 MED ORDER — FLUCONAZOLE 150 MG PO TABS: 150.0000 mg | ORAL_TABLET | Freq: Every day | ORAL | 0 refills | Status: DC

## 2023-11-22 ENCOUNTER — Ambulatory Visit: Payer: Self-pay | Admitting: Pulmonary Disease

## 2023-11-22 ENCOUNTER — Other Ambulatory Visit: Payer: Self-pay | Admitting: Family Medicine

## 2023-11-22 DIAGNOSIS — R052 Subacute cough: Secondary | ICD-10-CM

## 2023-11-22 NOTE — Telephone Encounter (Addendum)
 Pt was offered acute Pulm appt today, states that she does not have transportation. Routing to pulm for further scheduling options.  E2C2 Pulmonary Triage - Initial Assessment Questions "Chief Complaint (e.g., cough, sob, wheezing, fever, chills, sweat or additional symptoms) *Go to specific symptom protocol after initial questions. Cough, productive  "How long have symptoms been present?" Couple weeks  Have you tested for COVID or Flu? Note: If not, ask patient if a home test can be taken. If so, instruct patient to call back for positive results. No  MEDICINES:   "Have you used any OTC meds to help with symptoms?" No  "Have you used your inhalers/maintenance medication?" Yes If yes, "What medications?" Yes using inhaler trelegy  If inhaler, ask "How many puffs and how often?" Note: Review instructions on medication in the chart. 1 puff, daily rx, doesn't seem to work as well as it use to   OXYGEN: "Do you wear supplemental oxygen?" Yes If yes, "How many liters are you supposed to use?" Only when sleeping or SOB during day, has been using the O2 more than normal  "Do you monitor your oxygen levels?" Yes If yes, What is your reading (oxygen level) today? 94%, without O2  What is your usual oxygen saturation reading?  (Note: Pulmonary O2 sats should be 90% or greater) 90%  FYI Only or Action Required?: Action required by provider: request for appointment.  Patient was last seen in primary care on 10/09/2023 by Colette Torrence GRADE, MD.  Called Nurse Triage reporting Cough.  Symptoms began several weeks ago.  Interventions attempted: Nothing.  Symptoms are: gradually worsening.  Triage Disposition: See Physician Within 24 Hours  Patient/caregiver understands and will follow disposition?: yes  Copied from CRM #[id number]. Topic: Clinical - Red Word Triage >> Nov 22, 2023 12:04 PM Corean SAUNDERS wrote: Red Word that prompted transfer to Nurse Triage: Severe cough with yellow  mucus. Reason for Disposition  [1] Known COPD or other severe lung disease (i.e., bronchiectasis, cystic fibrosis, lung surgery) AND [2] symptoms getting worse (i.e., increased sputum purulence or amount, increased breathing difficulty  Answer Assessment - Initial Assessment Questions 1. ONSET: When did the cough begin?      Probably a couple weeks 2. SEVERITY: How bad is the cough today?      Not real bad but enough I cough up mucus 3. SPUTUM: Describe the color of your sputum (e.g., none, dry cough; clear, white, yellow, green)     yellow 4. HEMOPTYSIS: Are you coughing up any blood? If Yes, ask: How much? (e.g., flecks, streaks, tablespoons, etc.)     denies 5. DIFFICULTY BREATHING: Are you having difficulty breathing? If Yes, ask: How bad is it? (e.g., mild, moderate, severe)      Not really 6. FEVER: Do you have a fever? If Yes, ask: What is your temperature, how was it measured, and when did it start?     denies 7. CARDIAC HISTORY: Do you have any history of heart disease? (e.g., heart attack, congestive heart failure)      denies 8. LUNG HISTORY: Do you have any history of lung disease?  (e.g., pulmonary embolus, asthma, emphysema)     COPD 10. OTHER SYMPTOMS: Do you have any other symptoms? (e.g., runny nose, wheezing, chest pain)       Congestion in chest, more than normal  Routing to  [facility] d/t no appt available. Please call pt back.  Protocols used: Cough - Acute Productive-A-AH

## 2023-11-23 ENCOUNTER — Telehealth: Payer: Self-pay | Admitting: Pulmonary Disease

## 2023-11-23 ENCOUNTER — Telehealth: Payer: Self-pay | Admitting: *Deleted

## 2023-11-23 DIAGNOSIS — J449 Chronic obstructive pulmonary disease, unspecified: Secondary | ICD-10-CM

## 2023-11-23 MED ORDER — DOXYCYCLINE HYCLATE 100 MG PO TABS: 100.0000 mg | ORAL_TABLET | Freq: Two times a day (BID) | ORAL | 0 refills | Status: DC

## 2023-11-23 NOTE — Telephone Encounter (Signed)
 Patient offered acute visit this am, did not have transportation.  Requesting recommendations.  Please advise.  Thank you.

## 2023-11-23 NOTE — Telephone Encounter (Signed)
 Prednisone  and zpak sent to pharmacy. She needs to schedule follow up visit.  JD

## 2023-11-23 NOTE — Telephone Encounter (Signed)
 Copied from CRM [id number]. Topic: Clinical - Order For Equipment >> Nov 22, 2023 11:58 AM [name] [name] wrote: Reason for CRM: Patient received CPAP machine from [employer] but states she needed [name] BIPAP machine.  Callback number: [phone]  I called and spoke with the pt She states that she went to DME and they gave her the correct equipment  Nothing further needed

## 2023-11-23 NOTE — Telephone Encounter (Signed)
 Doxycycline  sent to pharmacy. Allergy to zpak and cortisone, no prednisone  sent.  [name] [name]

## 2023-11-26 NOTE — Telephone Encounter (Signed)
 Doxycycline  sent to pharmacy. Allergy to zpak and cortisone, no prednisone  sent.       Front staff can you please schedule pt follow up visit.  Thanks!

## 2023-11-27 ENCOUNTER — Telehealth: Payer: Self-pay

## 2023-11-27 NOTE — Telephone Encounter (Signed)
 Copied from CRM #[id number]. Topic: Appointments - Scheduling Inquiry for Clinic >> Nov 26, 2023  4:17 PM Celestine F wrote: Reason for CRM: Pt is wanting to schedule an appt or talk with her about her sx. Pt stated she is having yellow mucus and increased coughing for about 2 weeks. Pt wanted to see NP Lauraine Lites after I also offered alternative providers.  Pt's phone number [phone] ok to leave a vm.     Spoke with patient said she is feeling much better with thr Rx that have been prescribe will see us  at next appoint

## 2023-12-10 ENCOUNTER — Encounter: Payer: Self-pay | Admitting: Radiology

## 2023-12-14 ENCOUNTER — Ambulatory Visit: Payer: Self-pay

## 2023-12-14 ENCOUNTER — Ambulatory Visit
Admission: RE | Admit: 2023-12-14 | Discharge: 2023-12-14 | Disposition: A | Discharge status: Home / Self Care | Attending: Internal Medicine | Admitting: Internal Medicine

## 2023-12-14 VITALS — BP 107/61 | HR 77 | Temp 98.0°F | Resp 18

## 2023-12-14 DIAGNOSIS — L03115 Cellulitis of right lower limb: Secondary | ICD-10-CM | POA: Diagnosis not present

## 2023-12-14 MED ORDER — CLINDAMYCIN HCL 150 MG PO CAPS: 450.0000 mg | ORAL_CAPSULE | Freq: Three times a day (TID) | ORAL | 0 refills | Status: AC

## 2023-12-14 NOTE — ED Provider Notes (Addendum)
 [name]    CSN: [id number] Arrival date & time: 12/14/23  1458      History   Chief Complaint Chief Complaint  Patient presents with   Leg Pain    RT lower    HPI [name] [name] is a 83 y.o. female.   Patient presents with right lower leg swelling and redness that started about 5 days ago.  Patient previously was diagnosed with cellulitis in the same area of her leg about 2 to 3 months ago.  She was treated with cephalexin  at first but had an allergic reaction to it so she was switched to doxycycline  that she took for 1 week with resolution.  She denies any recent fevers, chills, body aches.  Patient wears 2 L O2 at night due to history of COPD.  She states that her baseline oxygen is low 90s, typically around 92%.  She denies any shortness of breath or cough different from baseline.  She is not currently wearing any oxygen.   Leg Pain   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   Cataract    Closed fracture of upper end of humerus 11/03/2017   COPD (chronic obstructive pulmonary disease) (HCC)    Depression    Diabetes 1.5, managed as type 2 (HCC)    Encounter to establish care 06/03/2020   Essential hypertension 05/19/2019   Female stress incontinence 02/14/2018   GERD (gastroesophageal reflux disease)    History of opioid abuse (HCC) 05/19/2019   Hyperglycemia 05/19/2019   Hyperlipidemia    Hypertension    Menopause present 02/11/2018   Need for shingles vaccine 07/01/2020   Osteoporosis    Sleep apnea    Final BiPAP pressure of 13/9 with 4 L of oxygen piped into system for persistent hypoxemia Patient used a medium size ResMed AirFit F30i fullface mask   Tobacco dependence in remission  05/19/2019    Patient Active Problem List   Diagnosis Date Noted   Parainfluenza infection 07/26/2023   COPD with acute exacerbation (HCC) 07/25/2023   Gait instability 07/05/2021   PAD (peripheral artery disease) 05/10/2021   Lymphadenopathy, inguinal 05/05/2021   Type 2 diabetes mellitus with hyperglycemia, without long-term current use of insulin  (HCC) 03/24/2021   Acute hypoxic respiratory failure (HCC) 03/10/2021   Hypertension associated with diabetes (HCC) 02/04/2021   Lactose intolerance 11/11/2020   Pulmonary nodules 10/21/2020   Neuropathy 07/01/2020   Encounter for medication management 06/03/2020   Coronary artery disease involving native heart without angina pectoris 12/18/2019   History of malignant melanoma 12/18/2019   Basal cell carcinoma of skin 12/18/2019   History of surgery for cerebral aneurysm 12/18/2019   Hyperlipidemia associated with type 2 diabetes mellitus (HCC) 05/22/2019   Adjustment disorder with mixed anxiety and depressed mood 05/19/2019   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 05/19/2019   Bilateral carotid artery occlusion 05/19/2019   Anemia 05/19/2019   Chronic pain syndrome 05/19/2019   Gastroesophageal reflux disease without esophagitis 05/19/2019   Generalized anxiety disorder 05/19/2019   Hypertensive heart disease without congestive heart failure 05/19/2019   Obstructive sleep apnea syndrome 05/19/2019   Polyp of colon 05/19/2019  Depression, major, single episode, moderate (HCC) 05/19/2019   Spinal stenosis in cervical region 05/19/2019   History of TIA (transient ischemic attack) 05/19/2019   Mixed urinary incontinence due to female genital prolapse 05/19/2019   Tricuspid valve disorders, 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   Panic disorder with agoraphobia 02/22/2018   Diverticulosis of large intestine 02/14/2018   Localized, primary osteoarthritis of shoulder region 02/14/2018   Mitral valve  regurgitation 02/14/2018   Grade I internal hemorrhoids 02/14/2018   Bilateral carpal tunnel syndrome 02/11/2018    Past Surgical History:  Procedure Laterality Date   ABDOMINAL HYSTERECTOMY  1980   BREAST EXCISIONAL BIOPSY     1990's   CHOLECYSTECTOMY  1980   MELANOMA EXCISION Right    Great Toe   REPLACEMENT TOTAL KNEE Bilateral 2000   sacral neuromodulation      OB History     Gravida  3   Para      Term      Preterm      AB      Living  3      SAB      IAB      Ectopic      Multiple      Live Births  3            Home Medications    Prior to Admission medications   Medication Sig Start Date End Date Taking? Authorizing Provider  clindamycin (CLEOCIN) 150 MG capsule Take 3 capsules (450 mg total) by mouth 3 (three) times daily for 5 days. 12/14/23 12/19/23 Yes Lavada Langsam, Darryle E, FNP  albuterol  (PROVENTIL ) (2.5 MG/3ML) 0.083% nebulizer solution INHALE 3 ML BY NEBULIZATION EVERY 6 HOURS AS NEEDED FOR WHEEZING OR SHORTNESS OF BREATH 11/20/23   Breeback, Jade L, PA-C  albuterol  (VENTOLIN  HFA) 108 (90 Base) MCG/ACT inhaler INHALE 1-2 PUFFS INTO THE LUNGS EVERY 4 HOURS AS NEEDED FOR WHEEZING OR SHORTNESS OF BREATH. 11/22/23   Booker Darice SAUNDERS, FNP  ALPRAZolam  (XANAX ) 0.25 MG tablet TAKE (1) TABLET BY MOUTH TWICE DAILY AT 11AM & 5PM FOR ANXIETY. Patient taking differently: Take 0.25 mg by mouth 3 (three) times daily as needed for anxiety. 01/25/21   Oris Camie BRAVO, NP  AMBULATORY NON FORMULARY MEDICATION One four wheel rolling walker with seat and hand brakes. Brand/style covered by insurance. Medically necessary for ICD-10: R26.81, Z74.1 12/15/20   Early, Sara E, NP  amLODIPine -Valsartan -HCTZ 10-320-25 MG TABS Take 1 tablet by mouth daily. 06/27/23   Booker Darice SAUNDERS, FNP  ARTHRITIS PAIN RELIEVING 0.075 % topical cream Apply 1 application topically 3 (three) times daily as needed (neuropathic pain in feet). 07/06/20   [provider]  atorvastatin  (LIPITOR) 10  MG tablet Take 1 tablet (10 mg total) by mouth daily. 10/10/23   Colette Torrence GRADE, MD  benzonatate  (TESSALON ) 100 MG capsule Take 1 capsule (100 mg total) by mouth 2 (two) times daily as needed for cough. 09/13/23   Colette Torrence GRADE, MD  blood glucose meter kit and supplies KIT Meter, test strips, lancets, and alcohol swabs. Use for testing blood sugar every morning before meals and up to 3 additional times per day as needed. Brand based on patient and insurance coverage. 100 strips with 99 refills, 100 lancets with 99 refills, 200 alcohol swabs with 99 refills. Dx: E11.65 03/24/21   Early, Sara E, NP  camphor-menthol VIKKI) lotion Apply 1 application topically as needed for itching. Patient taking differently: Apply 1 application  topically 2 (two) times daily as  needed for itching. 12/19/19   Gladis Elsie BROCKS, PA-C  carvedilol  (COREG ) 6.25 MG tablet TAKE 1 TABLET BY MOUTH 2 TIMES DAILY WITH A MEAL. 09/24/23   Colette Torrence GRADE, MD  cetirizine (ZYRTEC) 10 MG tablet TAKE 1 TABLET BY MOUTH EVERY DAY 06/14/23   Colette Torrence GRADE, MD  cholestyramine  (QUESTRAN ) 4 g packet TAKE 1 PACKET (4 G TOTAL) BY MOUTH 2 TIMES DAILY. 07/31/23   Beather Delon Gibson, PA  [facility] TEST test strip  03/24/21   [provider]  estradiol  (ESTRACE ) 0.1 MG/GM vaginal cream Place 0.5 g vaginally 2 (two) times a week. Place 0.5g nightly for two weeks then twice a week after 10/25/23   Zuleta, Kaitlin G, NP  FARXIGA  10 MG TABS tablet TAKE 1 TABLET BY MOUTH DAILY BEFORE BREAKFAST. 06/22/23   Colette Torrence GRADE, MD  ferrous sulfate  325 (65 FE) MG tablet TAKE 1 TABLET BY MOUTH EVERY DAY 09/28/23   Colette Torrence GRADE, MD  fluconazole  (DIFLUCAN ) 150 MG tablet Take 1 tablet (150 mg total) by mouth daily. 11/20/23   Zuleta, Kaitlin G, NP  Fluticasone -Umeclidin-Vilant (TRELEGY ELLIPTA ) 100-62.5-25 MCG/ACT AEPB INHALE 1 PUFF BY MOUTH INTO THE LUNGS DAILY. ** RINSE MOUTH AFTER USE** 11/19/23   Ruthell Lauraine FALCON, NP  gabapentin  (NEURONTIN ) 300  MG capsule Take 300 mg by mouth daily.    [provider]  guaiFENesin  (ROBITUSSIN) 100 MG/5ML liquid Take 5 mLs by mouth every 4 (four) hours as needed for cough or to loosen phlegm. 07/30/23   Pokhrel, Vernal, MD  hydrALAZINE  (APRESOLINE ) 25 MG tablet TAKE 1 TABLET BY MOUTH TWICE A DAY 06/26/23   Rucker, Torrence GRADE, MD  lidocaine  (LIDODERM ) 5 % Place 1 patch onto the skin daily. Remove & Discard patch within 12 hours or as directed by MD 12/12/22   Colette Torrence GRADE, MD  methenamine  (HIPREX ) 1 g tablet Take 1 tablet (1 g total) by mouth 2 (two) times daily with a meal. 02/19/23   Marilynne Rosaline SAILOR, MD  mirabegron  ER (MYRBETRIQ ) 50 MG TB24 tablet Take 1 tablet (50 mg total) by mouth daily. 10/23/23   Zuleta, Kaitlin G, NP  mirtazapine  (REMERON ) 7.5 MG tablet Take 7.5 mg by mouth at bedtime. 06/21/23   [provider]  nystatin  ointment (MYCOSTATIN ) Apply 1 Application topically 2 (two) times daily. 05/17/23   Colette Torrence GRADE, MD  pantoprazole  (PROTONIX ) 40 MG tablet Take 1 tablet (40 mg total) by mouth daily for 14 days. 07/30/23 11/06/23  Pokhrel, Laxman, MD  PARoxetine  (PAXIL ) 40 MG tablet Take 40 mg by mouth daily. 10/08/19   [provider]  YUVAFEM  10 MCG TABS vaginal tablet PLACE 1 TABLET VAGINALLY TWICE A WEEK. 05/11/23   Zuleta, Kaitlin G, NP    Family History Family History  Problem Relation Age of Onset   Cancer Mother    Intellectual disability Mother    Cancer Father    Early death Father    Hypertension Father    Breast cancer Sister    Cancer Sister    Arthritis Maternal Grandmother    Asthma Paternal Grandmother    Asthma Paternal Grandfather    Colon cancer Neg Hx    Stomach cancer Neg Hx    Esophageal cancer Neg Hx    Colon polyps Neg Hx     Social History Social History   Tobacco Use   Smoking status: Former    Current packs/day: 0.00    Types: Cigarettes    Start date: 1957  Quit date: 45    Years since quitting: 37.8   Smokeless  tobacco: Never  Vaping Use   Vaping status: Never Used  Substance Use Topics   Alcohol use: Not Currently   Drug use: Never     Allergies   Azithromycin , Bee venom, Erythromycin base, Metronidazole, Nitrofurantoin macrocrystal, Penicillins, Sulfa antibiotics, Ceftriaxone  sodium in dextrose , Cortisone, Cymbalta [duloxetine hcl], Ivp dye [iodinated contrast media], Keflex  [cephalexin ], Penicillin g, Codeine, Erythromycin, Fosfomycin, Levofloxacin, Nitrofurantoin, and Percocet [oxycodone-acetaminophen ]   Review of Systems Review of Systems Per HPI  Physical Exam Triage Vital Signs ED Triage Vitals [12/14/23 1510]  Encounter Vitals Group     BP 107/61     Girls Systolic BP Percentile      Girls Diastolic BP Percentile      Boys Systolic BP Percentile      Boys Diastolic BP Percentile      Pulse Rate 77     Resp 18     Temp 98 F (36.7 C)     Temp Source Oral     SpO2 (!) 89 %     Weight      Height      Head Circumference      Peak Flow      Pain Score 1     Pain Loc      Pain Education      Exclude from Growth Chart    No data found.  Updated Vital Signs BP 107/61 (BP Location: Right Wrist)   Pulse 77   Temp 98 F (36.7 C) (Oral)   Resp 18   SpO2 91%   Visual Acuity Right Eye Distance:   Left Eye Distance:   Bilateral Distance:    Right Eye Near:   Left Eye Near:    Bilateral Near:     Physical Exam Constitutional:      General: She is not in acute distress.    Appearance: Normal appearance. She is not toxic-appearing or diaphoretic.  HENT:     Head: Normocephalic and atraumatic.  Eyes:     Extraocular Movements: Extraocular movements intact.     Conjunctiva/sclera: Conjunctivae normal.  Pulmonary:     Effort: Pulmonary effort is normal.  Skin:    Comments: Superficial erythema present to right anterior leg.  Mild 1+ pitting edema noted.  Appears to be neurovascularly intact.  No abrasions or lacerations noted.  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  Neurological:     General: No focal deficit present.     Mental Status: She is alert and oriented to person, place, and time. Mental status is at baseline.  Psychiatric:        Mood and Affect: Mood normal.        Behavior: Behavior normal.        Thought Content: Thought content normal.        Judgment: Judgment normal.      UC Treatments / Results  Labs (all labs ordered are listed, but only abnormal results are displayed) Labs Reviewed - No data to display  EKG   Radiology No results found.  Procedures Procedures (including critical care time)  Medications Ordered in UC Medications - No data to display  Initial Impression / Assessment and Plan / UC Course  I have reviewed the triage vital signs and the nursing notes.  Pertinent labs & imaging results that were available during my care of the patient were reviewed by me and considered in  my medical decision making (see chart for details).     Physical exam is consistent with cellulitis of right lower extremity, especially given previous diagnosis in same area about 2 months prior.  She had DVT ultrasound of area at that time which was negative.  I do not have any suspicion of DVT due to physical exam at this time. Although, strict ER precautions were given.  Given concern of cellulitis, will treat with antibiotic.  Patient has multiple antibiotic allergies.  The safest antibiotics would be doxycycline  versus clindamycin.  Patient previously took doxycycline  about 1 to 2 months ago so I did discuss with patient that clindamycin may be more reasonable to treat infection given she just completed doxycycline  recently.  She was agreeable with this.  Educated patient to take this medication with food as well.  Creatinine clearance is 64 so there is no dosage adjustment necessary for clindamycin.  Advised follow-up with PCP and ER precautions.  Patient and her son who was at bedside verbalized  understanding and were agreeable with plan.  Oxygen is at baseline per patient report and there are no signs of respiratory distress. Patient encouraged to monitor at home O2 at home. Final Clinical Impressions(s) / UC Diagnoses   Final diagnoses:  Cellulitis of right lower extremity     Discharge Instructions      I have prescribed clindamycin antibiotic to treat cellulitis of lower extremity.  Please take with food to avoid stomach upset.  Follow-up at the ER if symptoms worsen.  Please make follow-up appointment with PCP as well.    ED Prescriptions     Medication Sig Dispense Auth. Provider   clindamycin (CLEOCIN) 150 MG capsule Take 3 capsules (450 mg total) by mouth 3 (three) times daily for 5 days. 45 capsule Tebbetts, Darryle BRAVO, [place]      PDMP not reviewed this encounter.   Hazen Darryle BRAVO, [place] 12/14/23 1617    Hazen Darryle BRAVO, [place] 12/14/23 [phone]

## 2023-12-14 NOTE — ED Triage Notes (Signed)
 Pt c/o RT lower leg pain, redness and swelling since Monday. Thinks its cellulitis which she has previously in same spot 2 months ago.

## 2023-12-14 NOTE — Discharge Instructions (Addendum)
 I have prescribed clindamycin antibiotic to treat cellulitis of lower extremity.  Please take with food to avoid stomach upset.  Follow-up at the ER if symptoms worsen.  Please make follow-up appointment with PCP as well.

## 2023-12-14 NOTE — Telephone Encounter (Signed)
 FYI Only or Action Required?: FYI only for provider: advised UC.  Patient was last seen in primary care on 10/09/2023 by Colette Torrence GRADE, MD.  Called Nurse Triage reporting Cellulitis.  Symptoms began several days ago.  Interventions attempted: OTC medications: salve ointment.  Symptoms are: gradually worsening.  Triage Disposition: See HCP Within 4 Hours (Or PCP Triage)  Patient/caregiver understands and will follow disposition?: Yes                            Copied from CRM [id number]. Topic: Clinical - Medical Advice >> Dec 14, 2023  9:46 AM Hadassah PARAS wrote: Reason for CRM: Pt's leg is red and warm. She states cellulitis is back and wants to know if she should make an appointment or can PCP prescribed mediction. Please advise #[phone]  Reason for Disposition  [1] Looks infected (e.g., spreading redness, pus) AND [2] large red area (> 2 inches or 5 cm)  Answer Assessment - Initial Assessment Questions 1. APPEARANCE of RASH: What does the rash look like? (e.g., blisters, dry flaky skin, red spots, redness, sores)     Redness, one large patch 2. LOCATION: Where is the rash located?      Right shin 3. NUMBER: How many spots are there?      1 4. SIZE: How big are the spots? (e.g., inches, cm; or compare to size of pinhead, tip of pen, eraser, pea)      3 inches in circumference  5. ONSET: When did the rash start?      A few days ago 6. ITCHING: Does the rash itch? If Yes, ask: How bad is the itch?  (Scale 0-10; or none, mild, moderate, severe)     Denies 7. PAIN: Does the rash hurt? If Yes, ask: How bad is the pain?  (Scale 0-10; or none, mild, moderate, severe)     Mild intermittent pain 8. OTHER SYMPTOMS: Do you have any other symptoms? (e.g., fever)     Mild swelling, warm to touch, denies fever, denies difficulty breathing, denies chest pain 9. PREGNANCY: Is there any chance you are pregnant? When was your last menstrual  period?     N/A    This RN advised evaluation within 4 hours. No availability in PCP office or alternate offices within region. Advised UC. Patient verbalized understanding and has an appointment at [facility] for this afternoon.  Protocols used: Rash or Redness - Localized-A-AH

## 2023-12-18 ENCOUNTER — Encounter: Payer: Self-pay | Admitting: Obstetrics and Gynecology

## 2023-12-18 ENCOUNTER — Ambulatory Visit (INDEPENDENT_AMBULATORY_CARE_PROVIDER_SITE_OTHER): Admitting: Obstetrics and Gynecology

## 2023-12-18 ENCOUNTER — Other Ambulatory Visit (HOSPITAL_COMMUNITY)
Admission: RE | Admit: 2023-12-18 | Discharge: 2023-12-18 | Disposition: A | Discharge status: Home / Self Care | Source: Ambulatory Visit | Attending: Obstetrics and Gynecology | Admitting: Obstetrics and Gynecology

## 2023-12-18 VITALS — BP 118/61 | HR 67 | Ht 65.0 in | Wt 254.0 lb

## 2023-12-18 DIAGNOSIS — R300 Dysuria: Secondary | ICD-10-CM | POA: Diagnosis not present

## 2023-12-18 DIAGNOSIS — N898 Other specified noninflammatory disorders of vagina: Secondary | ICD-10-CM

## 2023-12-18 DIAGNOSIS — L900 Lichen sclerosus et atrophicus: Secondary | ICD-10-CM | POA: Diagnosis not present

## 2023-12-18 DIAGNOSIS — E7800 Pure hypercholesterolemia, unspecified: Secondary | ICD-10-CM | POA: Insufficient documentation

## 2023-12-18 DIAGNOSIS — G5600 Carpal tunnel syndrome, unspecified upper limb: Secondary | ICD-10-CM | POA: Insufficient documentation

## 2023-12-18 LAB — POC URINALSYSI DIPSTICK (AUTOMATED)
Bilirubin, UA: NEGATIVE
Glucose, UA: POSITIVE — AB
Ketones, UA: NEGATIVE
Nitrite, UA: POSITIVE
Protein, UA: POSITIVE — AB
Spec Grav, UA: 1.015 (ref 1.010–1.025)
Urobilinogen, UA: 0.2 U/dL
pH, UA: 5.5 (ref 5.0–8.0)

## 2023-12-18 MED ORDER — CLOBETASOL PROPIONATE E 0.05 % EX CREA: 1.0000 | TOPICAL_CREAM | Freq: Every evening | CUTANEOUS | 2 refills | Status: AC

## 2023-12-18 NOTE — Progress Notes (Signed)
 [facility] Return Visit  SUBJECTIVE  History of Present Illness: [name] [name] is a 83 y.o. female seen in follow-up for Vaginal pain/irritation. Plan at last visit was take Diflucan  for yeast and treat yeast in the bladder. Patient reports she never really feels the itching got better and she is now being treated for cellulitis with antibiotics so unsure if yeast is back.   Patient describes the vaginal area as feeling irritated and burning like. Patient reports she has been doing Boric acid suppositories.   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   Past Surgical History: She  has a past surgical history that includes Abdominal hysterectomy (1980); Melanoma excision (Right); Cholecystectomy (1980); Replacement total knee (Bilateral, 2000); sacral neuromodulation; and Breast excisional biopsy.   Medications: She has a current medication list which includes the following prescription(s): albuterol , albuterol , alprazolam , AMBULATORY NON FORMULARY MEDICATION, amlodipine -valsartan -hctz, arthritis pain relieving, atorvastatin , blood glucose meter kit and supplies, sarna,  carvedilol , cetirizine, cholestyramine , clindamycin, clobetasol propionate e, easymax test, estradiol , farxiga , ferrous sulfate , trelegy ellipta , gabapentin , guaifenesin , hydralazine , lidocaine , methenamine , mirabegron  er, mirtazapine , nystatin  ointment, paroxetine , and yuvafem .   Allergies: Patient is allergic to azithromycin , bee venom, erythromycin base, metronidazole, nitrofurantoin macrocrystal, penicillins, sulfa antibiotics, ceftriaxone  sodium in dextrose , cortisone, cymbalta [duloxetine hcl], ivp dye [iodinated contrast media], keflex  [cephalexin ], penicillin g, codeine, erythromycin, fosfomycin, levofloxacin, nitrofurantoin, and percocet [oxycodone-acetaminophen ].   Social History: Patient  reports that she quit smoking about 37 years ago. Her smoking use included cigarettes. She started smoking about 68 years ago. She has never used smokeless tobacco. She reports that she does not currently use alcohol. She reports that she does not use drugs.     OBJECTIVE     Physical Exam: Vitals:   12/18/23 1441  BP: 118/61  Pulse: 67  Weight: 254 lb (115.2 kg)  Height: 5' 5 (1.651 m)   Gen: No apparent distress, A&O x 3.  Detailed Urogynecologic Evaluation:  External vaginal tissue has a purple appearance and is raw looking, but not as red as previous. Concern for external lichens. Vaginal swab obtained to rule out continued yeast.   Lab Results  Component Value Date   COLORU yellow 12/18/2023   CLARITYU cloudy 12/18/2023   GLUCOSEUR Positive (A) 12/18/2023   BILIRUBINUR neg 12/18/2023   KETONESU neg 12/18/2023   SPECGRAV 1.015 12/18/2023   RBCUR TRACE 12/18/2023   PHUR 5.5 12/18/2023   PROTEINUR Positive (A) 12/18/2023   UROBILINOGEN 0.2 12/18/2023   LEUKOCYTESUR Moderate (2+) (A) 12/18/2023      ASSESSMENT AND PLAN    [name] [name] is a 83 y.o. with:  1. Dysuria   2. Lichen sclerosus    Will send urine for pathnostics to rule out continued yeast in bladder or other  sign of infection.  Aptima swab obtained and will treat as needed. Questionable lichen. Patient does not wish to do a biopsy, will start her on external low dose steroid cream  to see if this helps her sensations that she is feeling and the pain. She is to use this nightly for the next month.   Patient to follow up in 4-6 weeks for vaginal exam.  Nairi Oswald G Margan Elias, NP

## 2023-12-18 NOTE — Patient Instructions (Signed)
 Use the steroid cream on the external vaginal region where the itching it. Please use this nightly for the next month.

## 2023-12-19 LAB — CERVICOVAGINAL ANCILLARY ONLY
Bacterial Vaginitis (gardnerella): NEGATIVE
Candida Glabrata: NEGATIVE
Candida Vaginitis: NEGATIVE
Comment: NEGATIVE
Comment: NEGATIVE
Comment: NEGATIVE

## 2023-12-20 ENCOUNTER — Ambulatory Visit: Payer: Self-pay | Admitting: Obstetrics and Gynecology

## 2023-12-20 ENCOUNTER — Ambulatory Visit: Admitting: Pulmonary Disease

## 2023-12-20 ENCOUNTER — Telehealth: Payer: Self-pay | Admitting: Family Medicine

## 2023-12-20 ENCOUNTER — Encounter: Payer: Self-pay | Admitting: Pulmonary Disease

## 2023-12-20 ENCOUNTER — Ambulatory Visit: Payer: Self-pay

## 2023-12-20 DIAGNOSIS — N390 Urinary tract infection, site not specified: Secondary | ICD-10-CM

## 2023-12-20 NOTE — Telephone Encounter (Unsigned)
 Copied from CRM [id number]. Topic: Clinical - Medication Question >> Dec 20, 2023  2:53 PM Donee H wrote: Reason for CRM: Patient called to state went to walkin clinic last Friday. She was told had cellulitis on leg.Clinic instructed patient to take clindamycin (CLEOCIN) 150 MG capsule for 5 days. She is wanting to know what should she do. She currently still have some medication left but has taken medication for 5 days already as instructed but cellulitis is still present. Please follow back up with patient [phone]

## 2023-12-20 NOTE — Telephone Encounter (Signed)
 FYI Only or Action Required?: FYI only for provider: appointment scheduled on 12/21/2023.  Patient was last seen in primary care on 10/09/2023 by Colette Torrence GRADE, MD.  Called Nurse Triage reporting Leg Swelling.  Symptoms began 12/14/2023 and worsening.  Interventions attempted: Prescription medications: ABX.  Symptoms are: rapidly improving.  Triage Disposition: See Physician Within 24 Hours  Patient/caregiver understands and will follow disposition?: Yes    Reason for Disposition  [1] MODERATE leg swelling (e.g., swelling extends up to knees) AND [2] new-onset or getting worse  Answer Assessment - Initial Assessment Questions 1. ONSET: When did the swelling start? (e.g., minutes, hours, days)     Ongoing x 12/14/2023 2. LOCATION: What part of the leg is swollen?  Are both legs swollen or just one leg?     Right lower leg / chin area 3. SEVERITY: How bad is the swelling? (e.g., localized; mild, moderate, severe)     Moderate  4. REDNESS: Is there redness or signs of infection?     The area was fire red and now pt states area is pink 5. PAIN: Is the swelling painful to touch? If Yes, ask: How painful is it?   (Scale 1-10; mild, moderate or severe)     discomfort 6. FEVER: Do you have a fever? If Yes, ask: What is it, how was it measured, and when did it start?      no 7. CAUSE: What do you think is causing the leg swelling?     cellulitis 8. MEDICAL HISTORY: Do you have a history of blood clots (e.g., DVT), cancer, heart failure, kidney disease, or liver failure?     na 9. RECURRENT SYMPTOM: Have you had leg swelling before? If Yes, ask: When was the last time? What happened that time?     na 10. OTHER SYMPTOMS: Do you have any other symptoms? (e.g., chest pain, difficulty breathing)       no 11. PREGNANCY: Is there any chance you are pregnant? When was your last menstrual period?       No  Pt has been taking ABX since urgent care and  still has swelling - scheduled pt appt via video per patient request due to no transportation for further evaluation.  Protocols used: Leg Swelling and Edema-A-AH

## 2023-12-21 ENCOUNTER — Telehealth: Payer: Self-pay | Admitting: *Deleted

## 2023-12-21 ENCOUNTER — Telehealth (INDEPENDENT_AMBULATORY_CARE_PROVIDER_SITE_OTHER): Admitting: Family Medicine

## 2023-12-21 ENCOUNTER — Encounter: Payer: Self-pay | Admitting: Family Medicine

## 2023-12-21 DIAGNOSIS — L03119 Cellulitis of unspecified part of limb: Secondary | ICD-10-CM | POA: Diagnosis not present

## 2023-12-21 MED ORDER — FLUCONAZOLE 150 MG PO TABS: 150.0000 mg | ORAL_TABLET | Freq: Once | ORAL | 0 refills | Status: AC

## 2023-12-21 MED ORDER — TRIAMCINOLONE ACETONIDE 0.1 % EX CREA: 1.0000 | TOPICAL_CREAM | Freq: Two times a day (BID) | CUTANEOUS | 0 refills | Status: DC

## 2023-12-21 MED ORDER — DOXYCYCLINE HYCLATE 100 MG PO CAPS: 100.0000 mg | ORAL_CAPSULE | Freq: Two times a day (BID) | ORAL | 0 refills | Status: DC

## 2023-12-21 MED ORDER — DOXYCYCLINE HYCLATE 100 MG PO TABS: 100.0000 mg | ORAL_TABLET | Freq: Two times a day (BID) | ORAL | 0 refills | Status: DC

## 2023-12-21 NOTE — Patient Instructions (Signed)
 Return in about 1 week (around 12/28/2023) for cellultitis follow up with pcp.        Great to see you today.  I have refilled the medication(s) we provide.   If labs were collected or images ordered, we will inform you of  results once we have received them and reviewed. We will contact you either by echart message, or telephone call.  Please give ample time to the testing facility, and our office to run,  receive and review results. Please do not call inquiring of results, even if you can see them in your chart. We will contact you as soon as we are able. If it has been over 1 week since the test was completed, and you have not yet heard from us , then please call us .    - echart message- for normal results that have been seen by the patient already.   - telephone call: abnormal results or if patient has not viewed results in their echart.  If a referral to a specialist was entered for you, please call us  in 2 weeks if you have not heard from the specialist office to schedule.

## 2023-12-21 NOTE — Progress Notes (Signed)
 VIRTUAL VISIT VIA VIDEO  I connected with [name] [name] on 12/21/23 at 10:00 AM EST by a video enabled telemedicine application and verified that I am speaking with the correct person using two identifiers. Location patient: Home Location provider: [facility], Office Persons participating in the virtual visit: Patient, Dr. Catherine and ALONSO Sharps, CMA  I discussed the limitations of evaluation and management by telemedicine and the availability of in person appointments. The patient expressed understanding and agreed to proceed.     [name] [name] , 30-Nov-1940, 83 y.o., female MRN: [id number] Patient Care Team    Relationship Specialty Notifications Start End  Colette Torrence GRADE, MD PCP - General Family Medicine  04/10/22   Kate Lonni CROME, MD PCP - Cardiology Cardiology  04/16/23     Chief Complaint  Patient presents with   Cellulitis    Pt seen 1 week ago for same sx; still seeing no improvement.      Subjective: [name] [name] is a 83 y.o. Pt presents for an OV with complaints of continued cellulitis greater than 3 weeks duration. Patient was seen at an urgent care 1 week ago for same concern and treated with Cleocin.  She had been treated with doxycycline  approximately 3 months ago for same concern, and seen resolution.  Liver Pt has tried cleocin to ease their symptoms-which was prescribed to her 1 week ago at the urgent care.  She reports this has needed about 50% better, but has not resolved the issue. Patient endorses tenderness at the area.  She denies any drainage or blister formation.  She does have lower extremity edema on occasions.  She denies any fevers or chills.     10/09/2023   11:16 AM 04/24/2023    2:20 PM 04/24/2023    2:15 PM 04/10/2022    2:57 PM 03/28/2022    3:39 PM  Depression screen PHQ 2/9  Decreased Interest 1 0 0 1 0  Down, Depressed, Hopeless 1 0 0 1 0  PHQ - 2 Score 2 0 0 2 0  Altered sleeping 0   0   Tired, decreased energy  2   1   Change in appetite 0   0   Feeling bad or failure about yourself  2   0   Trouble concentrating 0   0   Moving slowly or fidgety/restless 0   0   Suicidal thoughts 0   0   PHQ-9 Score 6    3    Difficult doing work/chores    Somewhat difficult      Data saved with a previous flowsheet row definition    Allergies  Allergen Reactions   Azithromycin  Anaphylaxis and Hives   Bee Venom Anaphylaxis   Erythromycin Base Anaphylaxis   Metronidazole Anaphylaxis   Nitrofurantoin Macrocrystal Anaphylaxis   Penicillins Anaphylaxis and Hives    childhood   Sulfa Antibiotics Anaphylaxis and Hives    Childhood   Ceftriaxone  Sodium In Dextrose  Rash    Patient report bad rash.    Cortisone     Other Reaction(s): Not available   Cymbalta [Duloxetine Hcl]    Ivp Dye [Iodinated Contrast Media]    Keflex  [Cephalexin ] Itching   Penicillin G Hives   Codeine Rash   Erythromycin Rash and Hives   Fosfomycin Rash   Levofloxacin Rash   Nitrofurantoin Hives   Percocet [Oxycodone-Acetaminophen ] Rash   Social History   Social History Narrative   Not on file  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   Cataract    Closed fracture of upper end of humerus 11/03/2017   COPD (chronic obstructive pulmonary disease) (HCC)    Depression    Diabetes 1.5, managed as type 2 (HCC)    Encounter to establish care 06/03/2020   Essential hypertension 05/19/2019   Female stress incontinence 02/14/2018   GERD (gastroesophageal reflux disease)    History of opioid abuse (HCC) 05/19/2019   Hyperglycemia 05/19/2019   Hyperlipidemia    Hypertension    Menopause present 02/11/2018   Need for shingles vaccine 07/01/2020   Osteoporosis    Sleep  apnea    Final BiPAP pressure of 13/9 with 4 L of oxygen piped into system for persistent hypoxemia Patient used a medium size ResMed AirFit F30i fullface mask   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   Past Surgical History:  Procedure Laterality Date   ABDOMINAL HYSTERECTOMY  1980   BREAST EXCISIONAL BIOPSY     1990's   CHOLECYSTECTOMY  1980   MELANOMA EXCISION Right    Great Toe   REPLACEMENT TOTAL KNEE Bilateral 2000   sacral neuromodulation     Family History  Problem Relation Age of Onset   Cancer Mother    Intellectual disability Mother    Cancer Father    Early death Father    Hypertension Father    Breast cancer Sister    Cancer Sister    Arthritis Maternal Grandmother    Asthma Paternal Grandmother    Asthma Paternal Grandfather    Colon cancer Neg Hx    Stomach cancer Neg Hx    Esophageal cancer Neg Hx    Colon polyps Neg Hx    Allergies as of 12/21/2023       Reactions   Azithromycin  Anaphylaxis, Hives   Bee Venom Anaphylaxis   Erythromycin Base Anaphylaxis   Metronidazole Anaphylaxis   Nitrofurantoin Macrocrystal Anaphylaxis   Penicillins Anaphylaxis, Hives   childhood   Sulfa Antibiotics Anaphylaxis, Hives   Childhood   Ceftriaxone  Sodium In Dextrose  Rash   Patient report bad rash.    Cortisone    Other Reaction(s): Not available   Cymbalta [duloxetine Hcl]    Ivp Dye [iodinated Contrast Media]    Keflex  [cephalexin ] Itching   Penicillin G Hives   Codeine Rash   Erythromycin Rash, Hives   Fosfomycin Rash   Levofloxacin Rash   Nitrofurantoin Hives   Percocet [oxycodone-acetaminophen ] Rash        Medication List        Accurate as of December 21, 2023 10:19 AM. If you have any questions, ask your nurse or doctor.          albuterol  (2.5 MG/3ML) 0.083% nebulizer solution Commonly known as: PROVENTIL  INHALE 3 ML BY NEBULIZATION EVERY 6 HOURS AS NEEDED FOR WHEEZING OR SHORTNESS OF BREATH   albuterol  108 (90 Base) MCG/ACT  inhaler Commonly known as: VENTOLIN  HFA INHALE 1-2 PUFFS INTO THE LUNGS EVERY 4 HOURS AS NEEDED FOR WHEEZING OR SHORTNESS OF BREATH.   ALPRAZolam  0.25 MG tablet Commonly known as: XANAX  TAKE (1) TABLET BY MOUTH TWICE DAILY AT 11AM & 5PM FOR ANXIETY. What changed: See the  new instructions.   AMBULATORY NON FORMULARY MEDICATION One four wheel rolling walker with seat and hand brakes. Brand/style covered by insurance. Medically necessary for ICD-10: R26.81, Z74.1   amLODIPine -Valsartan -HCTZ 10-320-25 MG Tabs Take 1 tablet by mouth daily.   Arthritis Pain Relieving 0.075 % topical cream Generic drug: capsicum Apply 1 application topically 3 (three) times daily as needed (neuropathic pain in feet).   atorvastatin  10 MG tablet Commonly known as: LIPITOR Take 1 tablet (10 mg total) by mouth daily.   blood glucose meter kit and supplies Kit Meter, test strips, lancets, and alcohol swabs. Use for testing blood sugar every morning before meals and up to 3 additional times per day as needed. Brand based on patient and insurance coverage. 100 strips with 99 refills, 100 lancets with 99 refills, 200 alcohol swabs with 99 refills. Dx: E11.65   carvedilol  6.25 MG tablet Commonly known as: COREG  TAKE 1 TABLET BY MOUTH 2 TIMES DAILY WITH A MEAL.   cetirizine 10 MG tablet Commonly known as: ZYRTEC TAKE 1 TABLET BY MOUTH EVERY DAY   cholestyramine  4 g packet Commonly known as: QUESTRAN  TAKE 1 PACKET (4 G TOTAL) BY MOUTH 2 TIMES DAILY.   Clobetasol Propionate E 0.05 % emollient cream Generic drug: Clobetasol Prop Emollient Base Apply 1 Application topically at bedtime.   doxycycline  100 MG tablet Commonly known as: VIBRA -TABS Take 1 tablet (100 mg total) by mouth every 12 (twelve) hours. Started by: Charlies Bellini   EasyMax Test test strip Generic drug: glucose blood   Farxiga  10 MG Tabs tablet Generic drug: dapagliflozin  propanediol TAKE 1 TABLET BY MOUTH DAILY BEFORE BREAKFAST.    ferrous sulfate  325 (65 FE) MG tablet TAKE 1 TABLET BY MOUTH EVERY DAY   gabapentin  300 MG capsule Commonly known as: NEURONTIN  Take 300 mg by mouth daily.   guaiFENesin  100 MG/5ML liquid Commonly known as: ROBITUSSIN Take 5 mLs by mouth every 4 (four) hours as needed for cough or to loosen phlegm.   hydrALAZINE  25 MG tablet Commonly known as: APRESOLINE  TAKE 1 TABLET BY MOUTH TWICE A DAY   lidocaine  5 % Commonly known as: Lidoderm  Place 1 patch onto the skin daily. Remove & Discard patch within 12 hours or as directed by MD   methenamine  1 g tablet Commonly known as: HIPREX  Take 1 tablet (1 g total) by mouth 2 (two) times daily with a meal.   mirabegron  ER 50 MG Tb24 tablet Commonly known as: MYRBETRIQ  Take 1 tablet (50 mg total) by mouth daily.   mirtazapine  7.5 MG tablet Commonly known as: REMERON  Take 7.5 mg by mouth at bedtime.   nystatin  ointment Commonly known as: MYCOSTATIN  Apply 1 Application topically 2 (two) times daily.   PARoxetine  40 MG tablet Commonly known as: PAXIL  Take 40 mg by mouth daily.   Sarna lotion Generic drug: camphor-menthol Apply 1 application topically as needed for itching. What changed: when to take this   Trelegy Ellipta  100-62.5-25 MCG/ACT Aepb Generic drug: Fluticasone -Umeclidin-Vilant INHALE 1 PUFF BY MOUTH INTO THE LUNGS DAILY. ** RINSE MOUTH AFTER USE**   triamcinolone  cream 0.1 % Commonly known as: KENALOG  Apply 1 Application topically 2 (two) times daily. Started by: Latrisha Coiro   Yuvafem  10 MCG Tabs vaginal tablet Generic drug: Estradiol  PLACE 1 TABLET VAGINALLY TWICE A WEEK.   estradiol  0.1 MG/GM vaginal cream Commonly known as: ESTRACE  Place 0.5 g vaginally 2 (two) times a week. Place 0.5g nightly for two weeks then twice a week after  All past medical history, surgical history, allergies, family history, immunizations andmedications were updated in the EMR today and reviewed under the history and  medication portions of their EMR.     ROS Negative, with the exception of above mentioned in HPI   Objective:  There were no vitals taken for this visit. There is no height or weight on file to calculate BMI. Physical Exam Vitals and nursing note reviewed.  Constitutional:      General: She is not in acute distress.    Appearance: Normal appearance. She is not toxic-appearing.  HENT:     Head: Normocephalic and atraumatic.  Eyes:     General: No scleral icterus.       Right eye: No discharge.        Left eye: No discharge.     Conjunctiva/sclera: Conjunctivae normal.  Pulmonary:     Effort: Pulmonary effort is normal.  Musculoskeletal:     Cervical back: Normal range of motion.  Skin:    Findings: Erythema and rash present.     Comments: LE: Mild erythema noted shin area. No bullae/blisters or drainage noted.   Neurological:     Mental Status: She is alert and oriented to person, place, and time. Mental status is at baseline.  Psychiatric:        Mood and Affect: Mood normal.        Behavior: Behavior normal.        Thought Content: Thought content normal.        Judgment: Judgment normal.     No results found. No results found. No results found for this or any previous visit (from the past 24 hours).  Assessment/Plan: [name] [name] is a 83 y.o. female present for OV for  Cellulitis of lower extremity, unspecified laterality (Primary) Patient was able to remove the camera down to her leg so I can visualize a portion of the rash.  It is possibly cellulitis versus venous stasis dermatitis/changes. Start doxycycline  twice daily x 7 days Prescribed Kenalog  cream to place twice daily to affected area -Strict emergent precautions over the weekend advised.  Follow-up with her PCP next week for reevaluation in person to ensure treatment plan effective.-Sooner if needed.  Reviewed expectations re: course of current medical issues. Discussed self-management of  symptoms. Outlined signs and symptoms indicating need for more acute intervention. Patient verbalized understanding and all questions were answered. Patient received an After-Visit Summary.    No orders of the defined types were placed in this encounter.  Meds ordered this encounter  Medications   doxycycline  (VIBRA -TABS) 100 MG tablet    Sig: Take 1 tablet (100 mg total) by mouth every 12 (twelve) hours.    Dispense:  14 tablet    Refill:  0   triamcinolone  cream (KENALOG ) 0.1 %    Sig: Apply 1 Application topically 2 (two) times daily.    Dispense:  30 g    Refill:  0   Referral Orders  No referral(s) requested today     Note is dictated utilizing voice recognition software. Although note has been proof read prior to signing, occasional typographical errors still can be missed. If any questions arise, please do not hesitate to call for verification.   electronically signed by:  Charlies Bellini, DO  [facility] - [place]

## 2023-12-21 NOTE — Telephone Encounter (Signed)
 TC Pt called with a question about medication.  MD called in Rx for doxycyline and so did Kaitlin. Pt asked if she needed to take both.  I advised to take one of the prescriptions not both. She understood.  [place] CMA

## 2023-12-23 ENCOUNTER — Other Ambulatory Visit: Payer: Self-pay | Admitting: Family Medicine

## 2023-12-23 DIAGNOSIS — I152 Hypertension secondary to endocrine disorders: Secondary | ICD-10-CM

## 2023-12-24 ENCOUNTER — Ambulatory Visit: Payer: Self-pay | Admitting: *Deleted

## 2023-12-24 NOTE — Telephone Encounter (Signed)
 Patient is in the hospital and will call back for a follow up.

## 2023-12-24 NOTE — Telephone Encounter (Signed)
 FYI Only or Action Required?: Action required by provider: update on patient condition and requesting for medication for continued sx .  Patient was last seen in primary care on 12/21/2023 by Catherine Fuller A, DO.  Called Nurse Triage reporting Leg Swelling.  Symptoms began several days ago.  Interventions attempted: Prescription medications: doxycyline and elevating right leg .  Symptoms are: gradually worsening.  Triage Disposition: See Physician Within 24 Hours  Patient/caregiver understands and will follow disposition?: No, wishes to speak with PCP   Patient requesting call back. See NT encounter. Offered appt tomorrow and patient declined.              Copied from CRM #[id number]. Topic: Clinical - Red Word Triage >> Dec 24, 2023 10:40 AM Wess RAMAN wrote: Red Word that prompted transfer to Nurse Triage:  Cellulitis flare up in leg. Has to prop it up all the time. Swells when standing and is getting worse Reason for Disposition  [1] MODERATE leg swelling (e.g., swelling extends up to knees) AND [2] new-onset or getting worse  Answer Assessment - Initial Assessment Questions No available appt with PCP until Jan. Offered appt with other provider tomorrow. Patient declined. Reports she is still taking doxycyline as prescribed 12/21/23. Right leg swelling below knee but decreases with elevating leg but worsening standing or walking. Please advise. Patient requesting call back. Recommended if sx worsen go to ED. No chest pain no difficulty breathing no fever      1. ONSET: When did the swelling start? (e.g., minutes, hours, days)     On and off x 10 days 2. LOCATION: What part of the leg is swollen?  Are both legs swollen or just one leg?     Right leg above ankle and below knee 3. SEVERITY: How bad is the swelling? (e.g., localized; mild, moderate, severe)     Moderate  with leg propped up  4. REDNESS: Is there redness or signs of infection?    Pinkish to   Redness  5. PAIN: Is the swelling painful to touch? If Yes, ask: How painful is it?   (Scale 1-10; mild, moderate or severe)     No pain  6. FEVER: Do you have a fever? If Yes, ask: What is it, how was it measured, and when did it start?      No  7. CAUSE: What do you think is causing the leg swelling?     Dx cellulitis  8. MEDICAL HISTORY: Do you have a history of blood clots (e.g., DVT), cancer, heart failure, kidney disease, or liver failure?     See hx  9. RECURRENT SYMPTOM: Have you had leg swelling before? If Yes, ask: When was the last time? What happened that time?     Yes and taking antibioitc currently  10. OTHER SYMPTOMS: Do you have any other symptoms? (e.g., chest pain, difficulty breathing)       No chest pain no difficulty breathing no fever , reports right leg swelling continues and worse with standing or walking. Swelling decreases with leg elevated  11. PREGNANCY: Is there any chance you are pregnant? When was your last menstrual period?       na  Protocols used: Leg Swelling and Edema-A-AH

## 2023-12-31 ENCOUNTER — Telehealth: Payer: Self-pay | Admitting: *Deleted

## 2023-12-31 NOTE — Transitions of Care (Post Inpatient/ED Visit) (Signed)
   12/31/2023  Name: [name] [name] MRN: [id number] DOB: 1940-10-12  Today's TOC FU Call Status: Today's TOC FU Call Status:: Unsuccessful Call (1st Attempt) Unsuccessful Call (1st Attempt) Date: 12/31/23  Attempted to reach the patient regarding the most recent Inpatient/ED visit.  Follow Up Plan: Additional outreach attempts will be made to reach the patient to complete the Transitions of Care (Post Inpatient/ED visit) call.   Andrea Dimes RN, BSN [place]  [facility] RN Care Manager [phone]

## 2024-01-01 ENCOUNTER — Telehealth: Payer: Self-pay | Admitting: *Deleted

## 2024-01-01 NOTE — Progress Notes (Signed)
 CARE COORDINATOR CONTACT WITH PATIENT/CAREGIVER AFTER HOSPITAL DISCHARGE   Unable to contact patient to encourage to schedule hospital follow up appointment with their Non NH PCP.  Left voice mail and sent mychart message with coordinator's contact information and availability.

## 2024-01-01 NOTE — Progress Notes (Signed)
 Patient contacted through doximity dialer  Hello [name] [name], this is Josette GORMAN Baker, CMA from [facility]. I tried reaching you to check in on your progress after your recent visit, but I was unable to get in touch. Please give us  a call back at [phone] at your earliest convenience. Unfortunately, you cannot respond to this message at this time.  Thank you!

## 2024-01-01 NOTE — Transitions of Care (Post Inpatient/ED Visit) (Signed)
   01/01/2024  Name: [name] [name] MRN: [id number] DOB: 11-23-1940  Today's TOC FU Call Status: Today's TOC FU Call Status:: Unsuccessful Call (2nd Attempt) Unsuccessful Call (2nd Attempt) Date: 01/01/24  Attempted to reach the patient regarding the most recent Inpatient/ED visit.  Follow Up Plan: Additional outreach attempts will be made to reach the patient to complete the Transitions of Care (Post Inpatient/ED visit) call.   Andrea Dimes RN, BSN [place]  [facility] RN Care Manager [phone]

## 2024-01-02 ENCOUNTER — Telehealth: Payer: Self-pay

## 2024-01-02 LAB — PATHNOSTICS MOLECULAR TEST

## 2024-01-02 NOTE — Transitions of Care (Post Inpatient/ED Visit) (Signed)
   01/02/2024  Name: [name] [name] MRN: [id number] DOB: January 14, 1941  Today's TOC FU Call Status: Today's TOC FU Call Status:: Unsuccessful Call (3rd Attempt) Unsuccessful Call (3rd Attempt) Date: 01/02/24  Attempted to reach the patient regarding the most recent Inpatient/ED visit.  Follow Up Plan: Additional outreach attempts will be made to reach the patient to complete the Transitions of Care (Post Inpatient/ED visit) call.   Amarian Botero J. Niel Peretti RN, MSN [facility], [facility] RN Care Manager Direct Dial: [phone]  Fax: [phone] Website: [email or web address]

## 2024-01-16 ENCOUNTER — Ambulatory Visit: Admitting: Family Medicine

## 2024-01-16 ENCOUNTER — Encounter: Payer: Self-pay | Admitting: Family Medicine

## 2024-01-16 VITALS — BP 125/74 | HR 84 | Temp 97.9°F | Ht 64.0 in | Wt 255.4 lb

## 2024-01-16 DIAGNOSIS — L03115 Cellulitis of right lower limb: Secondary | ICD-10-CM | POA: Diagnosis not present

## 2024-01-16 MED ORDER — CLINDAMYCIN HCL 300 MG PO CAPS: 300.0000 mg | ORAL_CAPSULE | Freq: Three times a day (TID) | ORAL | 0 refills | Status: DC

## 2024-01-16 NOTE — Progress Notes (Signed)
° °  Established Patient Office Visit  Subjective   Patient ID: [name] [name], female    DOB: 1940/09/09  Age: 83 y.o. MRN: [id number]  Chief Complaint  Patient presents with   Hospitalization Follow-up    HPI Admitted 12-23-09/22 for cellulitis of right leg. Received IV antibiotics while in hospital.  Discharged with two more days of Clindamycin  300 mg TID  Symptoms improved on Clindamycin .  Redness never resolved after discharge.  There is erythema and warmth, swelling to right lower extremity. No fever.  Does not feel ill.     ROS    Objective:     BP 125/74 (BP Location: Left Arm, Patient Position: Sitting, Cuff Size: Large)   Pulse 84   Temp 97.9 F (36.6 C) (Oral)   Ht 5' 4 (1.626 m)   Wt 255 lb 6.4 oz (115.8 kg)   SpO2 93% Comment: at rest, wears oxygen at night  BMI 43.84 kg/m    Physical Exam Cardiovascular:     Rate and Rhythm: Normal rate and regular rhythm.     Heart sounds: Normal heart sounds.  Pulmonary:     Effort: Pulmonary effort is normal.     Breath sounds: Normal breath sounds.  Skin:    General: Skin is warm and dry.         Comments: Right lower extremity with erythema, swelling, and warmth to touch. 17 x 15 cm   Neurological:     General: No focal deficit present.     Mental Status: Mental status is at baseline.  Psychiatric:        Mood and Affect: Mood normal.        Behavior: Behavior normal.        Thought Content: Thought content normal.        Judgment: Judgment normal.      No results found for any visits on 01/16/24.    The ASCVD Risk score (Arnett DK, et al., 2019) failed to calculate for the following reasons:   The 2019 ASCVD risk score is only valid for ages 16 to 31    Assessment & Plan:   Problem List Items Addressed This Visit     Cellulitis of right lower extremity - Primary   Admitted for RLE cellulitis 11/17-11/22 Reports symptoms  improved on antibiotics but never completely  resolved. Discharged home on clindamycin  300 mg TID. There is 17x15 cm erythematous and warm to touch area on RLE. No fever. Clindamycin  300 mg TID for 10 days She will return if symptoms do not resolve. Symptoms reviewed that warrant seeking higher level of care.  Understands need for close follow-up if symptoms do not resolve.  Vital signs stable today, oxygen saturation improve upon recheck.       Relevant Medications   clindamycin  (CLEOCIN ) 300 MG capsule  Agrees with plan of care discussed.  Questions answered.   Return if symptoms worsen or fail to improve.    Darice JONELLE Brownie, FNP

## 2024-01-16 NOTE — Assessment & Plan Note (Addendum)
 Admitted for RLE cellulitis 11/17-11/22 Reports symptoms  improved on antibiotics but never completely resolved. Discharged home on clindamycin  300 mg TID. There is 17x15 cm erythematous and warm to touch area on RLE. No fever. Clindamycin  300 mg TID for 10 days She will return if symptoms do not resolve. Symptoms reviewed that warrant seeking higher level of care.  Understands need for close follow-up if symptoms do not resolve.  Vital signs stable today, oxygen saturation improve upon recheck.

## 2024-01-19 ENCOUNTER — Other Ambulatory Visit: Payer: Self-pay | Admitting: Family Medicine

## 2024-01-19 DIAGNOSIS — E1165 Type 2 diabetes mellitus with hyperglycemia: Secondary | ICD-10-CM

## 2024-01-19 DIAGNOSIS — E1159 Type 2 diabetes mellitus with other circulatory complications: Secondary | ICD-10-CM

## 2024-01-19 DIAGNOSIS — I2510 Atherosclerotic heart disease of native coronary artery without angina pectoris: Secondary | ICD-10-CM

## 2024-01-19 DIAGNOSIS — E1169 Type 2 diabetes mellitus with other specified complication: Secondary | ICD-10-CM

## 2024-01-29 ENCOUNTER — Encounter: Payer: Self-pay | Admitting: Obstetrics and Gynecology

## 2024-01-29 ENCOUNTER — Ambulatory Visit (INDEPENDENT_AMBULATORY_CARE_PROVIDER_SITE_OTHER): Admitting: Obstetrics and Gynecology

## 2024-01-29 VITALS — BP 104/62 | HR 76

## 2024-01-29 DIAGNOSIS — Z87440 Personal history of urinary (tract) infections: Secondary | ICD-10-CM

## 2024-01-29 DIAGNOSIS — L900 Lichen sclerosus et atrophicus: Secondary | ICD-10-CM | POA: Diagnosis not present

## 2024-01-29 DIAGNOSIS — N390 Urinary tract infection, site not specified: Secondary | ICD-10-CM

## 2024-01-29 DIAGNOSIS — R238 Other skin changes: Secondary | ICD-10-CM

## 2024-01-29 MED ORDER — SILVER SULFADIAZINE 1 % EX CREA: 1.0000 | TOPICAL_CREAM | Freq: Every day | CUTANEOUS | 0 refills | Status: AC

## 2024-01-29 NOTE — Progress Notes (Signed)
 [facility] Return Visit  SUBJECTIVE  History of Present Illness: [name] [name] is a 83 y.o. female seen in follow-up for questionable lichen, vaginal irritation, and UTI. Plan at last visit was start clobetasol  cream at the itching areas and we treated her UTI.   Patient has been doing well with her bladder and vagina, but has had cellulitis and has had multiple rounds of antibiotics.   She reports improvement of her symptoms related to the lichen with use of clobetasol  cream. Reports she does not have any bladder symptoms except occasional cloudy urine.    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   Past Surgical History: She  has a past surgical history that includes Abdominal hysterectomy (1980); Melanoma excision (Right); Cholecystectomy (1980); Replacement total knee (Bilateral, 2000); sacral neuromodulation; and Breast excisional biopsy.   Medications: She has a current medication list which includes the following prescription(s): albuterol , albuterol , alprazolam , AMBULATORY NON FORMULARY MEDICATION, amlodipine -valsartan -hctz, arthritis pain relieving, atorvastatin ,  blood glucose meter kit and supplies, sarna, carvedilol , cetirizine, cholestyramine , clindamycin , clobetasol  propionate e, doxycycline , easymax test, estradiol , farxiga , ferrous sulfate , trelegy ellipta , gabapentin , guaifenesin , hydralazine , lidocaine , methenamine , mirabegron  er, mirtazapine , nystatin  ointment, paroxetine , silver  sulfadiazine , triamcinolone  cream, and yuvafem .   Allergies: Patient is allergic to azithromycin , bee venom, erythromycin base, metronidazole, nitrofurantoin macrocrystal, penicillins, sulfa antibiotics, ceftriaxone  sodium in dextrose , cortisone, cymbalta [duloxetine hcl], ivp dye [iodinated contrast media], keflex  [cephalexin ], penicillin g, codeine, erythromycin, fosfomycin , levofloxacin, nitrofurantoin, and percocet [oxycodone-acetaminophen ].   Social History: Patient  reports that she quit smoking about 38 years ago. Her smoking use included cigarettes. She started smoking about 69 years ago. She has never used smokeless tobacco. She reports that she does not currently use alcohol. She reports that she does not use drugs.     OBJECTIVE   Lab Results  Component Value Date   COLORU yellow 12/18/2023   CLARITYU cloudy 12/18/2023   GLUCOSEUR Positive (A) 12/18/2023   BILIRUBINUR neg 12/18/2023   KETONESU neg 12/18/2023   SPECGRAV 1.015 12/18/2023   RBCUR TRACE 12/18/2023   PHUR 5.5 12/18/2023   PROTEINUR Positive (A) 12/18/2023   UROBILINOGEN 0.2 12/18/2023   LEUKOCYTESUR Moderate (2+) (A) 12/18/2023      Physical Exam: Vitals:   01/29/24 1255  BP: 104/62  Pulse: 76   Gen: No apparent distress, A&O x 3.  Detailed Urogynecologic Evaluation:  Deferred.   Right leg has puckered and tight skin from the healing cellulitis. The area is mildly warm to the touch and minimally erythematous.    ASSESSMENT AND PLAN    [name] [name] is a 83 y.o. with:  1. Skin irritation   2. Lichen sclerosus   3. Recurrent urinary tract infection    Patient prescribed  silvadene  cream to use on the puckered area of skin that is tight related to recent cellulitis and she will plan to follow up with PCP should she need more support. We discussed using a small  amount on her hand first to make sure no reaction before applying to the skin of the leg.  Patient to continue clobetasol  PRN for vaginal irritation/itching.  Patient to call if she has concerns of UTI.   Patient to follow up in 4 months or sooner if concerns of UTI.   [name] Robello G Richard Holz, NP

## 2024-01-29 NOTE — Telephone Encounter (Signed)
 This task has been completed as requested. The patient was notified of the update. No other inquiries during the call.

## 2024-01-29 NOTE — Telephone Encounter (Unsigned)
 Copied from CRM [id number]. Topic: Clinical - Medical Advice >> Jan 28, 2024  4:16 PM [name] wrote: Reason for CRM: The patient has been having [name] [name] treat her cellulitis. Her skin on her leg has now hardened and she is wondering if there was a cream that she could use to soften it up. She doesn't have any other symptoms present, but did mention that sometimes there is pains that come and go.

## 2024-02-04 ENCOUNTER — Ambulatory Visit: Payer: Self-pay

## 2024-02-04 NOTE — Telephone Encounter (Signed)
 FYI Only or Action Required?: FYI only for provider: appointment scheduled on 12/31.  Patient was last seen in primary care on 01/16/2024 by [name] Darice SAUNDERS, FNP.  Called Nurse Triage reporting Cellulitis.  Symptoms began several weeks ago.  Interventions attempted: Prescription medications: been on numerous abx, including IV abx.  Symptoms are: unchanged.  Triage Disposition: See PCP When Office is Open (Within 3 Days)  Patient/caregiver understands and will follow disposition?: Yes, will follow disposition  Copied from CRM #[id number]. Topic: Clinical - Red Word Triage >> Feb 04, 2024  3:48 PM [place] MATSU wrote: Red Word that prompted transfer to Nurse Triage: painful right lower leg cellulitis is not improving after being seen 01/16/24 no improvement medication not working unsure of next step. Reason for Disposition  [1] Finished taking antibiotic AND [2] cellulitis symptoms are BETTER (e.g., redness, pain drainage, swelling) BUT [3] not completely gone  Answer Assessment - Initial Assessment Questions 1. SYMPTOM: What's the main symptom you're concerned about? (e.g., redness, swelling, pain, fever, weakness)     Red, warm, swollen 2. CELLULITIS LOCATION: Where is the cellulitis located? (e.g., hand, arm, foot, leg, face)     R lower leg 3. CELLULITIS SIZE: What is the size of the red area? (e.g., inches, centimeters; compare to size of a coin).     5 inch diameter 4. BETTER-SAME-WORSE: Are you getting better, staying the same, or getting worse compared to the day you started the antibiotics?      Not better 5. PAIN: Do you have any pain?  If Yes, ask: How bad is the pain?  (e.g., Scale 0-10; mild, moderate, or severe)     Intermittent 6 6. FEVER: Do you have a fever? If Yes, ask: What is it, how was it measured and when did it start?     denies 7. OTHER SYMPTOMS: Do you have any other symptoms? (e.g., pus coming from a wound, red streaks, weakness)     Denies  drainage, drying up 8. DIAGNOSIS DATE: When was the cellulitis diagnosed? By whom?      Was admitted for 6 days.  Protocols used: Cellulitis on Antibiotic Follow-up Call-A-AH

## 2024-02-06 ENCOUNTER — Encounter: Payer: Self-pay | Admitting: Family Medicine

## 2024-02-06 ENCOUNTER — Ambulatory Visit: Admitting: Family Medicine

## 2024-02-06 VITALS — BP 127/70 | HR 79 | Temp 97.9°F | Ht 64.0 in | Wt 254.0 lb

## 2024-02-06 DIAGNOSIS — L03115 Cellulitis of right lower limb: Secondary | ICD-10-CM | POA: Diagnosis not present

## 2024-02-06 MED ORDER — CLINDAMYCIN HCL 300 MG PO CAPS: 300.0000 mg | ORAL_CAPSULE | Freq: Three times a day (TID) | ORAL | 0 refills | Status: AC

## 2024-02-06 NOTE — Assessment & Plan Note (Addendum)
 Recurring cellulitis on right lower extremity.  Has been hospitalized in the past on IV antibiotics. Has seen this provider twice for this problem. Right lower extremity with pitting edema. This has been present for some time. 12/24/23: ultrasound negative for DVT.  There is a possibilty of lymphedema per previous hospital note.  Has tried silvadene  cream on leg with some relief per GYN provider.  Has not been putting TED hose on right leg, was told to avoid TED hose while there is redness. The skin is intact.   Plan: Clindamycin  300 mg TID x 10 days Apply TED hose in  mornings to avoid edema from developing.  Okay to continue using silvadene  cream for comfort.  Referral placed to wound care center for evaluation.  Follow-up with PCP in 2 weeks.

## 2024-02-06 NOTE — Progress Notes (Signed)
 "  Established Patient Office Visit  Subjective   Patient ID: [name] [name], female    DOB: 06-22-1940  Age: 83 y.o. MRN: [id number]  Chief Complaint  Patient presents with   Cellulitis    Right lower leg    Presents today with recurring cellulitis on right leg. Has been hospitalized in the past on IV antibiotics. Has seen this provider twice for this problem. Right lower extremity with pitting edema. This has been present for some time. 12/24/23: ultrasound negative for DVT.  There is a possibilty of lymphedema. Has tried silvadene  cream on leg with some relief.  Has not been putting TED hose on right leg.  Plan: Clindamycin  300 mg TID x 10 days Apply TED hose in  mornings. Okay to continue using silvadene  cream. Referral placed to wound care center for evaluation.        ROS    Objective:     BP 127/70 (BP Location: Left Arm, Patient Position: Sitting, Cuff Size: Large)   Pulse 79   Temp 97.9 F (36.6 C) (Oral)   Ht 5' 4 (1.626 m)   Wt 254 lb (115.2 kg)   SpO2 95%   BMI 43.60 kg/m    Physical Exam Vitals and nursing note reviewed.  Constitutional:      General: She is not in acute distress.    Appearance: Normal appearance. She is not ill-appearing.  Cardiovascular:     Rate and Rhythm: Normal rate and regular rhythm.     Heart sounds: Normal heart sounds.  Pulmonary:     Effort: Pulmonary effort is normal.     Breath sounds: Normal breath sounds.  Musculoskeletal:     Right lower leg: 1+ Pitting Edema present.     Left lower leg: 1+ Pitting Edema present.  Skin:    General: Skin is warm and dry.         Comments: 9 x 7 cm area of erythema on lower right leg.  Warm to touch.   Neurological:     General: No focal deficit present.     Mental Status: She is alert. Mental status is at baseline.  Psychiatric:        Mood and Affect: Mood normal.        Behavior: Behavior normal.        Thought Content: Thought content normal.         Judgment: Judgment normal.      No results found for any visits on 02/06/24.    The ASCVD Risk score (Arnett DK, et al., 2019) failed to calculate for the following reasons:   The 2019 ASCVD risk score is only valid for ages 9 to 46   * - Cholesterol units were assumed    Assessment & Plan:   Problem List Items Addressed This Visit     Cellulitis of right lower extremity - Primary   Recurring cellulitis on right lower extremity.  Has been hospitalized in the past on IV antibiotics. Has seen this provider twice for this problem. Right lower extremity with pitting edema. This has been present for some time. 12/24/23: ultrasound negative for DVT.  There is a possibilty of lymphedema per previous hospital note.  Has tried silvadene  cream on leg with some relief per GYN provider.  Has not been putting TED hose on right leg, was told to avoid TED hose while there is redness. The skin is intact.   Plan: Clindamycin  300 mg TID x 10 days Apply TED hose  in  mornings to avoid edema from developing.  Okay to continue using silvadene  cream for comfort.  Referral placed to wound care center for evaluation.  Follow-up with PCP in 2 weeks.         Relevant Medications   clindamycin  (CLEOCIN ) 300 MG capsule   Other Relevant Orders   AMB referral to wound care center  Agrees with plan of care discussed.  Questions answered.   Return in about 2 weeks (around 02/20/2024) for cellulitis .    Darice JONELLE Brownie, FNP  "

## 2024-02-12 ENCOUNTER — Encounter (HOSPITAL_BASED_OUTPATIENT_CLINIC_OR_DEPARTMENT_OTHER): Attending: General Surgery | Admitting: General Surgery

## 2024-02-12 DIAGNOSIS — E1142 Type 2 diabetes mellitus with diabetic polyneuropathy: Secondary | ICD-10-CM | POA: Diagnosis not present

## 2024-02-12 DIAGNOSIS — Z09 Encounter for follow-up examination after completed treatment for conditions other than malignant neoplasm: Secondary | ICD-10-CM | POA: Insufficient documentation

## 2024-02-12 DIAGNOSIS — I890 Lymphedema, not elsewhere classified: Secondary | ICD-10-CM | POA: Insufficient documentation

## 2024-02-13 ENCOUNTER — Other Ambulatory Visit: Payer: Self-pay | Admitting: Emergency Medicine

## 2024-02-13 ENCOUNTER — Telehealth: Payer: Self-pay

## 2024-02-13 DIAGNOSIS — I890 Lymphedema, not elsewhere classified: Secondary | ICD-10-CM

## 2024-02-13 NOTE — Telephone Encounter (Signed)
 Copied from CRM #[id number]. Topic: Clinical - Medical Advice >> Feb 13, 2024 11:10 AM Jasmin G wrote: Reason for CRM: Pt requested a call back from  Dr. Susa team to discuss her currently being on clindamycin  (CLEOCIN ) 300 MG capsule and giving her indigestion issues, pt also wanted to discuss of getting referred to an infectious disease clinic to treat her leg issues.

## 2024-02-13 NOTE — Telephone Encounter (Signed)
 [facility], [place] and was advised they are not accepting new patients at the moment. Will call patient and ask if the [facility] or [place] location is an option.

## 2024-02-13 NOTE — Telephone Encounter (Signed)
 Contacted patient and was able to receive clarification regarding referral. In regards to Clindamycin  medication, per provider, I advised it is okay to discontinue for the time being. Furthermore regarding referral, pt states she was advised by wound care in [place] she is in need of a referral for LYMPHEDEMA. Below I will be providing the facility name and phone number given to myself by our pt. Please advise if any further information is necessary. Thank you.   [facility] [address], [place], [place] [address] Phone: [phone]

## 2024-02-14 NOTE — Telephone Encounter (Signed)
 Copied from CRM #[id number]. Topic: Referral - Request for Referral >> Feb 14, 2024  1:47 PM Hadassah PARAS wrote: Did the patient discuss referral with their provider in the last year? Yes (If No - schedule appointment) (If Yes - send message)  Appointment offered? Yes  Type of order/referral and detailed reason for visit: Rash on leg that is cellulitis  Preference of office, provider, location: infectious disease doctor located in [place]; no specific dr in mind   If referral order, have you been seen by this specialty before? No (If Yes, this issue or another issue? When? Where?  Can we respond through MyChart? Yes

## 2024-02-14 NOTE — Telephone Encounter (Signed)
 Contacted pt and advise of new information. Patient stated she would prefer [facility] in [place] due to location being closer to home. Below I will be attaching location, phone, and fax number for future referral. Thank you.  [facility] [address] [place], [place], [place] [address] PHONE: [phone] FAX: [phone]

## 2024-02-14 NOTE — Telephone Encounter (Signed)
 Duplicate. We established that she needed therapy for lymphedema not Infectious Disease.

## 2024-02-15 NOTE — Telephone Encounter (Signed)
 Referral placed.

## 2024-02-15 NOTE — Addendum Note (Signed)
 Addended by: RUCKER, Nickolai Rinks Y on: 02/15/2024 09:15 AM   Modules accepted: Orders

## 2024-02-21 ENCOUNTER — Ambulatory Visit

## 2024-02-22 ENCOUNTER — Telehealth: Payer: Self-pay

## 2024-02-22 ENCOUNTER — Other Ambulatory Visit: Payer: Self-pay | Admitting: Family Medicine

## 2024-02-22 ENCOUNTER — Ambulatory Visit: Payer: Self-pay

## 2024-02-22 DIAGNOSIS — E1165 Type 2 diabetes mellitus with hyperglycemia: Secondary | ICD-10-CM

## 2024-02-22 NOTE — Telephone Encounter (Signed)
 Copied from CRM [id number]. Topic: Clinical - Medication Question >> Feb 22, 2024  9:39 AM [name] [name] wrote: Reason for CRM: Patient stated that her cellulitis has moved her up leg two inches, no pain, no bleeding and she is requesting medication, no appointment. She said that what she was prescribed previously caused her bad indigestion,but If Dr. Colette wants her to take that same medicine again, she would need to have something for indigestion.   [phone] (M)

## 2024-02-22 NOTE — Telephone Encounter (Signed)
 FYI Only or Action Required?: FYI only for provider: no appointments available at regional clinics, proceeding to urgent care.  Patient was last seen in primary care on 02/06/2024 by Booker Darice SAUNDERS, FNP.  Called Nurse Triage reporting Cellulitis and Urinary Tract Infection.  Symptoms began several days ago.  Interventions attempted: Rest, hydration, or home remedies.  Symptoms are: gradually worsening.  Triage Disposition: See Physician Within 24 Hours  Patient/caregiver understands and will follow disposition?: Yes  Reason for Disposition  [1] Looks infected (e.g., spreading redness, pus) AND [2] no fever  Answer Assessment - Initial Assessment Questions Patient calling in to schedule for Cellulitis that is worsening, and dysuria. She was dx with cellulitis right lower extremity 01/16/24, she completed 10 days of antibiotic with some improvement but never resolved. She is calling today because the cellulitis has expanded by 2 inches. No drainage.  No appointments are available, advised urgent care. Patient is unsure if she can go due to needing to secure transportation, she will call back if unable to attend urgent care today.    1. LOCATION: Where is the wound located?      Right leg 2. WOUND APPEARANCE: What does the wound look like?      Dry, redness 3. SIZE: If redness is present, ask: What is the size of the red area? (Inches, centimeters, or compare to size of a coin)      Spreading-moved up leg by two inches- No red streaking from wound 4. SPREAD: What's changed in the last day?  Do you see any red streaks coming from the wound?     Spreading  5. ONSET: When did it start to look infected?      Cellulitis dx few months ago, spreading X 2 days 6. MECHANISM: How did the wound start, what was the cause?     Cellulitis 7. PAIN: Do you have any pain?  If Yes, ask: How bad is the pain?  (e.g., Scale 1-10; mild, moderate, or severe)     Denies  8. FEVER: Do  you have a fever? If Yes, ask: What is your temperature, how was it measured, and when did it start?     denies 9. OTHER SYMPTOMS: Do you have any other symptoms? (e.g., shaking chills , weakness, rash elsewhere on body)      Dysuria.  Protocols used: Wound Infection Suspected-A-AH Message from Kevelyn M sent at 02/22/2024  1:33 PM EST  Reason for Triage: Patient is calling because she says that her cellulitis has moved her up leg two inches, and is experiencing pain, but no bleeding and she is requesting medication.

## 2024-02-25 ENCOUNTER — Ambulatory Visit

## 2024-02-25 ENCOUNTER — Other Ambulatory Visit (HOSPITAL_COMMUNITY)
Admission: RE | Admit: 2024-02-25 | Discharge: 2024-02-25 | Disposition: A | Source: Ambulatory Visit | Attending: Obstetrics and Gynecology | Admitting: Obstetrics and Gynecology

## 2024-02-25 VITALS — BP 110/67 | HR 65

## 2024-02-25 DIAGNOSIS — R82998 Other abnormal findings in urine: Secondary | ICD-10-CM

## 2024-02-25 DIAGNOSIS — R350 Frequency of micturition: Secondary | ICD-10-CM | POA: Diagnosis not present

## 2024-02-25 DIAGNOSIS — R319 Hematuria, unspecified: Secondary | ICD-10-CM

## 2024-02-25 LAB — URINALYSIS, COMPLETE (UACMP) WITH MICROSCOPIC
Bilirubin Urine: NEGATIVE
Glucose, UA: >=500 mg/dL — AB
Hgb urine dipstick: NEGATIVE
Ketones, ur: NEGATIVE mg/dL
Nitrite: POSITIVE — AB
Protein, ur: 30 mg/dL — AB
Specific Gravity, Urine: 1.012 (ref 1.005–1.030)
WBC, UA: >50 WBC/hpf (ref 0–5)
pH: 6 (ref 5.0–8.0)

## 2024-02-25 LAB — POCT URINALYSIS DIP (CLINITEK)
Bilirubin, UA: NEGATIVE
Glucose, UA: NEGATIVE mg/dL
Ketones, POC UA: NEGATIVE mg/dL
Nitrite, UA: POSITIVE — AB
Spec Grav, UA: 1.02
Urobilinogen, UA: 0.2 U/dL
pH, UA: 6

## 2024-02-25 NOTE — Progress Notes (Signed)
 [name] [name] arrived today with dysuria. Patient is notexperiencing fever, unstable vitals and/or one-sided back flank pain. Patient has not had had a recent hospitalization due to UTI.  Last visit in the office was 01/29/2024.  Per protocol:   The most recent Urinalysis completed on 12/18/2023 and was not normal.  Last Creatinine level  Lab Results  Component Value Date   CREATININE 1.33 (H) 08/01/2023    An urine specimen was collected and POCT urinalysis completed. [] A cath specimen was collected due to patient's current condition, symptoms or post-procedural state.  Total urine output by catheter is  Output by Drain (mL) 02/23/24 0701 - 02/23/24 1900 02/23/24 1901 - 02/24/24 0700 02/24/24 0701 - 02/24/24 1900 02/24/24 1901 - 02/25/24 0700 02/25/24 0701 - 02/25/24 1523  Patient has no LDAs of requested type attached.    SABRA    POCT Urine results is not normal.  Urine micro was sent per protocol for abnormal urinalysis.  Urine culture was sent per protocol for abnormal urinalysis.     [] Pt was notified of positive urine results and plan for additional urine testing. We will contact you within the next 3-4 days with these results.  [] No Prescription was sent to your pharmacy.  The additional testing will indicate if a prescription is needed.   [] Patient was notified of abnormal urine results. The following prescription is sent to your preferred pharmacy.  []  Macrobid 100mg  #10 1 tablet by mouth twice daily with food for 5 days      []  Bactrim DS 800-160mg  #6 1 tablet by mouth twice daily for 3 days        []  Due to your current medication allergies, an alternate prescription was discussed with your provider and will be prescribed and sent to your pharmacy.  [] You can take over the counter AZO two tablets up to three times a day for two days.  Take AZO tablets with a full glass of water. AZO will turn your urine orange, this is normal.   [] The patient was notified of negative urine results.   If symptoms persist, you may take over the counter AZO two tablets up to three times a day for two days.  AZO will turn your urine orange, this is normal.  Contact the office back to schedule an appointment if your symptoms persist or worsen or you develop additional symptoms.       CC'd note to patient's provider.

## 2024-02-25 NOTE — Telephone Encounter (Signed)
 Pt scheduled to be seen 02/28/2024.

## 2024-02-27 ENCOUNTER — Ambulatory Visit: Payer: Self-pay | Admitting: Obstetrics and Gynecology

## 2024-02-27 ENCOUNTER — Telehealth: Payer: Self-pay

## 2024-02-27 DIAGNOSIS — N390 Urinary tract infection, site not specified: Secondary | ICD-10-CM

## 2024-02-27 LAB — URINE CULTURE: Culture: >=100000 — AB

## 2024-02-27 MED ORDER — FLUCONAZOLE 150 MG PO TABS: 150.0000 mg | ORAL_TABLET | Freq: Once | ORAL | 0 refills | Status: AC

## 2024-02-27 MED ORDER — CIPROFLOXACIN HCL 500 MG PO TABS: 500.0000 mg | ORAL_TABLET | Freq: Two times a day (BID) | ORAL | 0 refills | Status: AC

## 2024-02-27 NOTE — Telephone Encounter (Signed)
 Patients son, calls today, he has seen her test results from her urine culture. Due to her extensive allergy list, it looks like we were awaiting results for treatment options. Please direct care.  Thank you,   Andree

## 2024-02-27 NOTE — Telephone Encounter (Signed)
 Patient has been notified and aware of medications prescribed.

## 2024-02-28 ENCOUNTER — Ambulatory Visit (INDEPENDENT_AMBULATORY_CARE_PROVIDER_SITE_OTHER): Admitting: Family Medicine

## 2024-02-28 ENCOUNTER — Encounter: Payer: Self-pay | Admitting: Family Medicine

## 2024-02-28 VITALS — BP 128/59 | HR 72 | Ht 64.0 in | Wt 253.0 lb

## 2024-02-28 DIAGNOSIS — L03119 Cellulitis of unspecified part of limb: Secondary | ICD-10-CM

## 2024-02-28 DIAGNOSIS — I872 Venous insufficiency (chronic) (peripheral): Secondary | ICD-10-CM | POA: Diagnosis not present

## 2024-02-28 DIAGNOSIS — Z7984 Long term (current) use of oral hypoglycemic drugs: Secondary | ICD-10-CM

## 2024-02-28 DIAGNOSIS — E1142 Type 2 diabetes mellitus with diabetic polyneuropathy: Secondary | ICD-10-CM

## 2024-02-28 DIAGNOSIS — I5032 Chronic diastolic (congestive) heart failure: Secondary | ICD-10-CM | POA: Insufficient documentation

## 2024-02-28 MED ORDER — TRIAMCINOLONE ACETONIDE 0.5 % EX OINT: 1.0000 | TOPICAL_OINTMENT | Freq: Two times a day (BID) | CUTANEOUS | 5 refills | Status: AC

## 2024-02-28 NOTE — Progress Notes (Signed)
 "  Established Patient Office Visit  Subjective   Patient ID: [name] [name], female    DOB: [birth year]  Age: 84 y.o. MRN: [id number]  Chief Complaint  Patient presents with   Cellulitis    2week follow-up / pt would like to begin medication for this / view previous encounters  Recently given bactrium and macrobid yesterday    HPI  Cellulitis Pt with hx of venous stasis dermatitis and acute cellulitis of right lower extremity. Given Cleocin  in December for infection. Here for follow up. Still red but not as red per patient. She has been referred to OT for lymphedema treatment and appt is in a few weeks. She has been seen by her Uro gyn for chronic UTI's. She was placed on Cipro  and Diflucan  yesterday.   Pt has DM. Taking Farxiga  10mg  daily.    Review of Systems  Skin:  Positive for rash.       Redness to right shin with scales  All other systems reviewed and are negative.     Objective:     BP (!) 128/59 (BP Location: Left Wrist, Patient Position: Sitting, Cuff Size: Small)   Pulse 72   Ht 5' 4 (1.626 m)   Wt 253 lb (114.8 kg)   SpO2 93%   BMI 43.43 kg/m     Physical Exam Vitals and nursing note reviewed.  Constitutional:      Appearance: Normal appearance. She is normal weight.  HENT:     Head: Normocephalic and atraumatic.     Right Ear: External ear normal.     Left Ear: External ear normal.     Nose: Nose normal.     Mouth/Throat:     Mouth: Mucous membranes are moist.     Pharynx: Oropharynx is clear.  Eyes:     Conjunctiva/sclera: Conjunctivae normal.     Pupils: Pupils are equal, round, and reactive to light.  Cardiovascular:     Rate and Rhythm: Normal rate.  Pulmonary:     Effort: Pulmonary effort is normal.  Skin:    General: Skin is warm.     Capillary Refill: Capillary refill takes less than 2 seconds.     Findings: Erythema present.     Comments: Some erythema to anterior shin with scaly/flaky skin  Neurological:     General: No focal  deficit present.     Mental Status: She is alert and oriented to person, place, and time. Mental status is at baseline.  Psychiatric:        Mood and Affect: Mood normal.        Behavior: Behavior normal.        Thought Content: Thought content normal.        Judgment: Judgment normal.      No results found for any visits on 02/28/24.     The ASCVD Risk score (Arnett DK, et al., 2019) failed to calculate for the following reasons:   The 2019 ASCVD risk score is only valid for ages 46 to 56   * - Cholesterol units were assumed    Assessment & Plan:   Problem List Items Addressed This Visit       Endocrine   Diabetic peripheral neuropathy associated with type 2 diabetes mellitus (HCC)   Relevant Orders   Hemoglobin A1c   Comprehensive metabolic panel with GFR   Other Visit Diagnoses       Cellulitis of lower extremity, unspecified laterality    -  Primary  Venous stasis dermatitis       Relevant Medications   triamcinolone  ointment (KENALOG ) 0.5 %     Cellulitis of lower extremity, unspecified laterality  Venous stasis dermatitis -     Triamcinolone  Acetonide; Apply 1 Application topically 2 (two) times daily.  Dispense: 30 g; Refill: 5  Diabetic peripheral neuropathy associated with type 2 diabetes mellitus (HCC) -     Hemoglobin A1c -     Comprehensive metabolic panel with GFR   Pt with recurrent cellulitis. With pt recent Cleocin  prescription and now on Cipro . She is allergic to multiple antibiotics so treatment is limited. Erythema not spreading and looks more chronic to me today ie venous stasis dermatitis. Explained this to pt and the need to watch this to avoid  C diff with all her recent abx usage. She is in agreement. To use Triamcinolone  cream BID for venous stasis dermatitis. Due to DM, recheck A1c and CMP today.  Return in about 3 months (around 05/28/2024) for Sub AWV.    Torrence CINDERELLA Barrier, MD  "

## 2024-02-29 ENCOUNTER — Ambulatory Visit: Payer: Self-pay | Admitting: Family Medicine

## 2024-02-29 LAB — COMPREHENSIVE METABOLIC PANEL WITH GFR
ALT: 11 IU/L (ref 0–32)
AST: 17 IU/L (ref 0–40)
Albumin: 4.1 g/dL (ref 3.7–4.7)
Alkaline Phosphatase: 108 IU/L (ref 48–129)
BUN/Creatinine Ratio: 20 (ref 12–28)
BUN: 21 mg/dL (ref 8–27)
Bilirubin Total: 0.5 mg/dL (ref 0.0–1.2)
CO2: 23 mmol/L (ref 20–29)
Calcium: 9.3 mg/dL (ref 8.7–10.3)
Chloride: 104 mmol/L (ref 96–106)
Creatinine, Ser: 1.06 mg/dL — ABNORMAL HIGH (ref 0.57–1.00)
Globulin, Total: 2.7 g/dL (ref 1.5–4.5)
Glucose: 127 mg/dL — ABNORMAL HIGH (ref 70–99)
Potassium: 3.9 mmol/L (ref 3.5–5.2)
Sodium: 142 mmol/L (ref 134–144)
Total Protein: 6.8 g/dL (ref 6.0–8.5)
eGFR: 52 mL/min/1.73 — ABNORMAL LOW

## 2024-02-29 LAB — HEMOGLOBIN A1C
Est. average glucose Bld gHb Est-mCnc: 123 mg/dL
Hgb A1c MFr Bld: 5.9 % — ABNORMAL HIGH (ref 4.8–5.6)

## 2024-03-06 ENCOUNTER — Other Ambulatory Visit: Payer: Self-pay | Admitting: Family Medicine

## 2024-03-14 ENCOUNTER — Other Ambulatory Visit: Payer: Self-pay | Admitting: Acute Care

## 2024-03-17 ENCOUNTER — Ambulatory Visit: Admitting: Pulmonary Disease

## 2024-04-16 ENCOUNTER — Ambulatory Visit: Admitting: Obstetrics and Gynecology

## 2024-05-06 ENCOUNTER — Encounter (INDEPENDENT_AMBULATORY_CARE_PROVIDER_SITE_OTHER): Admitting: Ophthalmology

## 2024-05-29 ENCOUNTER — Ambulatory Visit: Admitting: Family Medicine
